# Patient Record
Sex: Female | Born: 1945
Health system: Southern US, Community
[De-identification: ages and names within clinical notes are randomized; demographics above are authoritative.]

## PROBLEM LIST (undated history)

## (undated) DIAGNOSIS — M1990 Unspecified osteoarthritis, unspecified site: Secondary | ICD-10-CM

## (undated) DIAGNOSIS — Z720 Tobacco use: Secondary | ICD-10-CM

## (undated) DIAGNOSIS — Z8719 Personal history of other diseases of the digestive system: Secondary | ICD-10-CM

## (undated) DIAGNOSIS — R519 Headache, unspecified: Secondary | ICD-10-CM

## (undated) DIAGNOSIS — Z9889 Other specified postprocedural states: Secondary | ICD-10-CM

## (undated) DIAGNOSIS — K759 Inflammatory liver disease, unspecified: Secondary | ICD-10-CM

## (undated) DIAGNOSIS — K219 Gastro-esophageal reflux disease without esophagitis: Secondary | ICD-10-CM

## (undated) DIAGNOSIS — I10 Essential (primary) hypertension: Secondary | ICD-10-CM

## (undated) DIAGNOSIS — R51 Headache: Secondary | ICD-10-CM

## (undated) DIAGNOSIS — I639 Cerebral infarction, unspecified: Secondary | ICD-10-CM

## (undated) HISTORY — PX: DERMOID CYST  EXCISION: SHX1452

## (undated) HISTORY — PX: APPENDECTOMY: SHX54

## (undated) HISTORY — DX: Tobacco use: Z72.0

## (undated) HISTORY — DX: Essential (primary) hypertension: I10

## (undated) HISTORY — DX: Cerebral infarction, unspecified: I63.9

---

## 1977-11-04 HISTORY — PX: OTHER SURGICAL HISTORY: SHX169

## 1998-02-20 ENCOUNTER — Ambulatory Visit (HOSPITAL_COMMUNITY): Admission: RE | Admit: 1998-02-20 | Discharge: 1998-02-20 | Payer: Self-pay | Admitting: *Deleted

## 2016-07-05 DIAGNOSIS — I639 Cerebral infarction, unspecified: Secondary | ICD-10-CM

## 2016-07-05 HISTORY — DX: Cerebral infarction, unspecified: I63.9

## 2016-07-20 ENCOUNTER — Emergency Department (HOSPITAL_COMMUNITY): Payer: Medicare HMO

## 2016-07-20 ENCOUNTER — Inpatient Hospital Stay (HOSPITAL_COMMUNITY)
Admission: EM | Admit: 2016-07-20 | Discharge: 2016-07-23 | DRG: 042 | Disposition: A | Discharge status: Home / Self Care | Payer: Medicare HMO | Attending: Family Medicine | Admitting: Family Medicine

## 2016-07-20 ENCOUNTER — Encounter (HOSPITAL_COMMUNITY): Payer: Self-pay

## 2016-07-20 DIAGNOSIS — I639 Cerebral infarction, unspecified: Secondary | ICD-10-CM | POA: Diagnosis not present

## 2016-07-20 DIAGNOSIS — G8314 Monoplegia of lower limb affecting left nondominant side: Secondary | ICD-10-CM | POA: Diagnosis present

## 2016-07-20 DIAGNOSIS — I10 Essential (primary) hypertension: Secondary | ICD-10-CM | POA: Diagnosis present

## 2016-07-20 DIAGNOSIS — I6381 Other cerebral infarction due to occlusion or stenosis of small artery: Secondary | ICD-10-CM

## 2016-07-20 DIAGNOSIS — R911 Solitary pulmonary nodule: Secondary | ICD-10-CM | POA: Diagnosis present

## 2016-07-20 DIAGNOSIS — E876 Hypokalemia: Secondary | ICD-10-CM

## 2016-07-20 DIAGNOSIS — Z720 Tobacco use: Secondary | ICD-10-CM

## 2016-07-20 DIAGNOSIS — R69 Illness, unspecified: Secondary | ICD-10-CM | POA: Diagnosis not present

## 2016-07-20 DIAGNOSIS — I6789 Other cerebrovascular disease: Secondary | ICD-10-CM | POA: Diagnosis not present

## 2016-07-20 DIAGNOSIS — R531 Weakness: Secondary | ICD-10-CM | POA: Diagnosis not present

## 2016-07-20 DIAGNOSIS — Z6832 Body mass index (BMI) 32.0-32.9, adult: Secondary | ICD-10-CM

## 2016-07-20 DIAGNOSIS — E669 Obesity, unspecified: Secondary | ICD-10-CM | POA: Diagnosis present

## 2016-07-20 DIAGNOSIS — R200 Anesthesia of skin: Secondary | ICD-10-CM | POA: Diagnosis not present

## 2016-07-20 DIAGNOSIS — F17210 Nicotine dependence, cigarettes, uncomplicated: Secondary | ICD-10-CM | POA: Diagnosis present

## 2016-07-20 LAB — COMPREHENSIVE METABOLIC PANEL
ALK PHOS: 52 U/L (ref 38–126)
ALT: 26 U/L (ref 14–54)
ANION GAP: 9 (ref 5–15)
AST: 26 U/L (ref 15–41)
Albumin: 4 g/dL (ref 3.5–5.0)
BILIRUBIN TOTAL: 0.6 mg/dL (ref 0.3–1.2)
BUN: 8 mg/dL (ref 6–20)
CALCIUM: 10.1 mg/dL (ref 8.9–10.3)
CO2: 26 mmol/L (ref 22–32)
CREATININE: 0.82 mg/dL (ref 0.44–1.00)
Chloride: 104 mmol/L (ref 101–111)
GFR calc non Af Amer: >60 mL/min (ref >60)
Glucose, Bld: 124 mg/dL — ABNORMAL HIGH (ref 65–99)
Potassium: 3.2 mmol/L — ABNORMAL LOW (ref 3.5–5.1)
Sodium: 139 mmol/L (ref 135–145)
TOTAL PROTEIN: 7.3 g/dL (ref 6.5–8.1)

## 2016-07-20 LAB — CBC
HEMATOCRIT: 43.4 % (ref 36.0–46.0)
HEMOGLOBIN: 14.3 g/dL (ref 12.0–15.0)
MCH: 30.5 pg (ref 26.0–34.0)
MCHC: 32.9 g/dL (ref 30.0–36.0)
MCV: 92.5 fL (ref 78.0–100.0)
Platelets: 264 10*3/uL (ref 150–400)
RBC: 4.69 MIL/uL (ref 3.87–5.11)
RDW: 14.7 % (ref 11.5–15.5)
WBC: 7 10*3/uL (ref 4.0–10.5)

## 2016-07-20 LAB — PROTIME-INR
INR: 1
PROTHROMBIN TIME: 13.2 s (ref 11.4–15.2)

## 2016-07-20 LAB — DIFFERENTIAL
Basophils Absolute: 0.1 10*3/uL (ref 0.0–0.1)
Basophils Relative: 1 %
EOS PCT: 1 %
Eosinophils Absolute: 0.1 10*3/uL (ref 0.0–0.7)
LYMPHS ABS: 2.6 10*3/uL (ref 0.7–4.0)
LYMPHS PCT: 38 %
MONO ABS: 0.3 10*3/uL (ref 0.1–1.0)
MONOS PCT: 5 %
Neutro Abs: 3.9 10*3/uL (ref 1.7–7.7)
Neutrophils Relative %: 55 %

## 2016-07-20 LAB — I-STAT TROPONIN, ED: Troponin i, poc: 0 ng/mL (ref 0.00–0.08)

## 2016-07-20 LAB — APTT: aPTT: 35 seconds (ref 24–36)

## 2016-07-20 MED ORDER — ACETAMINOPHEN 325 MG PO TABS
650.0000 mg | ORAL_TABLET | ORAL | Status: DC | PRN
  Administered 2016-07-21: 650 mg via ORAL
  Filled 2016-07-20: qty 2

## 2016-07-20 MED ORDER — SENNOSIDES-DOCUSATE SODIUM 8.6-50 MG PO TABS
1.0000 | ORAL_TABLET | Freq: Every evening | ORAL | Status: DC | PRN
  Administered 2016-07-22: 1 via ORAL
  Filled 2016-07-20: qty 1

## 2016-07-20 MED ORDER — ACETAMINOPHEN 650 MG RE SUPP: 650.0000 mg | RECTAL | Status: DC | PRN

## 2016-07-20 MED ORDER — ASPIRIN 325 MG PO TABS
325.0000 mg | ORAL_TABLET | Freq: Every day | ORAL | Status: DC
  Administered 2016-07-20 – 2016-07-23 (×4): 325 mg via ORAL
  Filled 2016-07-20 (×4): qty 1

## 2016-07-20 MED ORDER — ENOXAPARIN SODIUM 40 MG/0.4ML ~~LOC~~ SOLN
40.0000 mg | SUBCUTANEOUS | Status: DC
  Administered 2016-07-20 – 2016-07-22 (×3): 40 mg via SUBCUTANEOUS
  Filled 2016-07-20 (×3): qty 0.4

## 2016-07-20 MED ORDER — STROKE: EARLY STAGES OF RECOVERY BOOK
Freq: Once | Status: AC
  Administered 2016-07-21: 09:00:00

## 2016-07-20 MED ORDER — POTASSIUM CHLORIDE CRYS ER 20 MEQ PO TBCR
40.0000 meq | EXTENDED_RELEASE_TABLET | Freq: Once | ORAL | Status: AC
  Administered 2016-07-20: 40 meq via ORAL
  Filled 2016-07-20: qty 2

## 2016-07-20 MED ORDER — LORAZEPAM 2 MG/ML IJ SOLN
1.0000 mg | Freq: Once | INTRAMUSCULAR | Status: AC
  Administered 2016-07-20: 1 mg via INTRAVENOUS
  Filled 2016-07-20: qty 1

## 2016-07-20 MED ORDER — ASPIRIN 300 MG RE SUPP: 300.0000 mg | Freq: Every day | RECTAL | Status: DC

## 2016-07-20 MED ORDER — HYDRALAZINE HCL 20 MG/ML IJ SOLN: 10.0000 mg | Freq: Three times a day (TID) | INTRAMUSCULAR | Status: DC | PRN

## 2016-07-20 MED ORDER — SODIUM CHLORIDE 0.9 % IV SOLN
INTRAVENOUS | Status: DC
  Administered 2016-07-20: 21:00:00 via INTRAVENOUS

## 2016-07-20 NOTE — ED Notes (Signed)
In mri

## 2016-07-20 NOTE — ED Provider Notes (Signed)
[facility] DEPT Provider Note   CSN: [id number] Arrival date & time: 07/20/16  1030     History   Chief Complaint Chief Complaint  Patient presents with  . Numbness    HPI VANISSA STRENGTH is a 70 y.o. female.  HPI  70 year old female presents with left leg weakness since around 1 AM. Went to bed last night feeling fine at 9:00. She went to go the bathroom at 1 AM and had trouble moving her left leg. She is able to walk but it feels like she is dragging her leg and like her foot is been down to the ground. There is no numbness. She has no headache, neck pain, upper extremity weakness, or right-sided symptoms. No chest pain, back pain, or abdominal pain. She states she has no medical problem is but also knowledge she has not seen a doctor in "a long time". She states she went to her part-time job yesterday and walked a lot but this was not abnormal for her or more than she would typically walk. There is no pain in her lower extremities.  History reviewed. No pertinent past medical history.  There are no active problems to display for this patient.   History reviewed. No pertinent surgical history.  OB History    No data available       Home Medications    Prior to Admission medications   Not on File    Family History No family history on file.  Social History Social History  Substance Use Topics  . Smoking status: Current Every Day Smoker  . Smokeless tobacco: Never Used  . Alcohol use Not on file     Allergies   Review of patient's allergies indicates no known allergies.   Review of Systems Review of Systems  Respiratory: Negative for shortness of breath.   Cardiovascular: Negative for chest pain.  Gastrointestinal: Negative for abdominal pain.  Musculoskeletal: Negative for back pain and neck pain.  Neurological: Positive for weakness. Negative for numbness and headaches.  All other systems reviewed and are negative.    Physical Exam Updated  Vital Signs BP (!) 180/107   Pulse 91   Temp 98.4 F (36.9 C) (Oral)   Resp 12   SpO2 100%   Physical Exam  Constitutional: She is oriented to person, place, and time. She appears well-developed and well-nourished.  HENT:  Head: Normocephalic and atraumatic.  Right Ear: External ear normal.  Left Ear: External ear normal.  Nose: Nose normal.  Eyes: Right eye exhibits no discharge. Left eye exhibits no discharge.  Cardiovascular: Normal rate, regular rhythm and normal heart sounds.   Pulses:      Dorsalis pedis pulses are 2+ on the right side, and 2+ on the left side.  Pulmonary/Chest: Effort normal and breath sounds normal.  Abdominal: She exhibits no distension. There is no tenderness.  Neurological: She is alert and oriented to person, place, and time.  CN 3-12 grossly intact. 5/5 strength in RUE, LUE, RLE. 4/5 strength in LLE. Grossly normal sensation. Normal finger to nose.   Skin: Skin is warm and dry.  Nursing note and vitals reviewed.    ED Treatments / Results  Labs (all labs ordered are listed, but only abnormal results are displayed) Labs Reviewed  COMPREHENSIVE METABOLIC PANEL - Abnormal; Notable for the following:       Result Value   Potassium 3.2 (*)    Glucose, Bld 124 (*)    All other components within normal limits  PROTIME-INR  APTT  CBC  DIFFERENTIAL  I-STAT TROPOININ, ED    EKG  EKG Interpretation  Date/Time:  Saturday July 20 2016 10:33:47 EDT Ventricular Rate:  96 PR Interval:  152 QRS Duration: 86 QT Interval:  360 QTC Calculation: 454 R Axis:   39 Text Interpretation:  Normal sinus rhythm Nonspecific ST abnormality Abnormal ECG No old tracing to compare Confirmed by Isis Costanza MD, Mauria Asquith [phone]) on 07/20/2016 10:49:58 AM       Radiology No results found.  Procedures Procedures (including critical care time)  Medications Ordered in ED Medications - No data to display   Initial Impression / Assessment and Plan / ED Course  I  have reviewed the triage vital signs and the nursing notes.  Pertinent labs & imaging results that were available during my care of the patient were reviewed by me and considered in my medical decision making (see chart for details).  Clinical Course  Comment By Time  I am concerned about a stroke. Is out of the TPA/Code stroke window. Re-eval after CT. Sherwood Gambler, MD 09/16 1131  Dr. Leonel Ramsay recommends MRI brain to rule in CVA. If negative would need spinal MRI workup. Sherwood Gambler, MD 09/16 1317    MRI pending when care transferred to Dr. Lacinda Axon.  Final Clinical Impressions(s) / ED Diagnoses   Final diagnoses:  None    New Prescriptions New Prescriptions   No medications on file     Sherwood Gambler, MD 07/20/16 1631

## 2016-07-20 NOTE — ED Notes (Signed)
The pt returned from [place] for food she did not finish  New given  Alert oriented skin warm and d ry  She had 2 mg of ativan  Given in mri.  Moves al 4s skin warm and dry.  No pain

## 2016-07-20 NOTE — Consult Note (Signed)
Neurology Consultation Reason for Consult: Left leg weakness Referring Physician: Regenia Skeeter, S  CC: Left leg weakness  History is obtained from: Patient  HPI: Kathleen Guerrero is a 70 y.o. female went to bed sometime between 9 and 10 last night, woke in the middle the night with left leg weakness. She states that she tossed and turned, and finally came in the emergency department today after calling her friend. She states that it seems to be a stable deficits started. An MRI was performed in the emergency department which shows a subcortical stroke on the right.   LKW: 9 PM 9/15 tpa given?: no, out of window    ROS: A 14 point ROS was performed and is negative except as noted in the HPI.   Medical history: None  Family history: No history of similar   Social History:  reports that she has been smoking.  She has never used smokeless tobacco. Her alcohol and drug histories are not on file.   Exam: Current vital signs: BP 179/92   Pulse 95   Temp 98.4 F (36.9 C) (Oral)   Resp 18   SpO2 96%  Vital signs in last 24 hours: Temp:  [98 F (36.7 C)-98.4 F (36.9 C)] 98.4 F (36.9 C) (09/16 1703) Pulse Rate:  [82-104] 95 (09/16 1815) Resp:  [12-27] 18 (09/16 1815) BP: (122-197)/(74-149) 179/92 (09/16 1815) SpO2:  [94 %-100 %] 96 % (09/16 1815)   Physical Exam  Constitutional: Appears well-developed and well-nourished.  Psych: Affect appropriate to situation Eyes: No scleral injection HENT: No OP obstrucion Head: Normocephalic.  Cardiovascular: Normal rate and regular rhythm.  Respiratory: Effort normal and breath sounds normal to anterior ascultation GI: Soft.  No distension. There is no tenderness.  Skin: WDI  Neuro: Mental Status: Patient is awake, alert, oriented to person, place, month, year, and situation. Patient is able to give a clear and coherent history. No signs of aphasia or neglect Cranial Nerves: II: Visual Fields are full. Pupils are equal, round, and  reactive to light.   III,IV, VI: EOMI without ptosis or diploplia.  V: Facial sensation is symmetric to temperature VII: Facial movement is symmetric.  VIII: hearing is intact to voice X: Uvula elevates symmetrically XI: Shoulder shrug is symmetric. XII: tongue is midline without atrophy or fasciculations.  Motor: Tone is normal. Bulk is normal. 5/5 strength was present in bilateral arms and right leg, she has 4/5 distally, 3-/proximally in the left leg Sensory: Sensation is symmetric to light touch and temperature in the arms and legs. Cerebellar: FNF intact bilaterally      I have reviewed labs in epic and the results pertinent to this consultation are: Mild hypokalemia  I have reviewed the images obtained: MRI brain-white matter infarct on the  Impression: 70 year old female with acute ischemic infarct who needs to be admitted for evaluation and therapy.  Recommendations: 1. HgbA1c, fasting lipid panel 2. CT angiogram of the head and neck 3. Frequent neuro checks 4. Echocardiogram 5. Prophylactic therapy-Antiplatelet med: Aspirin - dose '325mg'$  PO or '300mg'$  PR 6. Risk factor modification 7. Telemetry monitoring 8. PT consult, OT consult, Speech consult 9. please page stroke NP  Or  PA  Or MD  from 8am -4 pm starting 9/17 as this patient will be followed by the stroke team at this point.   You can look them up on [email or web address]      Roland Rack, MD [facility] [phone]  If 7pm- 7am, please page neurology on call  as listed in [place].

## 2016-07-20 NOTE — ED Triage Notes (Signed)
Patient complains of inability to use her left leg since am, last normal after walking 6500 steps yesterday and states when she awoke unable to move her leg.  No other neuro deficits, denis pain to leg just doesn't seem to want to work.

## 2016-07-20 NOTE — H&P (Signed)
History and Physical    Kathleen Guerrero ZWC:[id number] DOB: January 15, 1946 DOA: 07/20/2016  PCP: No primary care provider on file.  Patient coming from: Home   Chief Complaint: Left LE weakness.   HPI: Kathleen Guerrero is a 70 y.o. female with no Known  medical history significant, does not follow with Doctors, who presents complaining of Left lower extremity weakness that started overnight. She think weakness started probably between 9 PM and 1 am (9-16). She wake up around 1 am to go to the bathroom, she didn't feel right, relates weakness left leg. She went back to sleep. This morning she wake up with persistent weakness of left lower extremity. She denies sl;urred speech, vision changes, or any other focal deficit.   ED Course: She was found to have elevated BP : 197/11, sodium normal, K at 3.2, CT head; no acute intra-cranial abnormalities, MRI Brain: Acute lacunar type infarct of the right corona radiata near the motor strip.  Review of Systems: As per HPI otherwise 10 point review of systems negative. Denies chest pain, dyspnea, abdominal pain.    past medical history.; no known past medical history   Past surgical history; none  Social history;   reports that she has been smoking.  She has never used smokeless tobacco. Her alcohol and drug histories are not on file. she is still working, part time at [employer].   No Known Allergies  Family history; parents deceased, no known medical problems. Daughter has melanoma.   Prior to Admission medications   Not on File    Physical Exam: Vitals:   07/20/16 1430 07/20/16 1445 07/20/16 1500 07/20/16 1703  BP: (!) 171/101 122/84 147/96 154/96  Pulse: 96 96 86 89  Resp: (!) '27 18 23 18  '$ Temp:    98.4 F (36.9 C)  TempSrc:    Oral  SpO2: 100% 97% 97% 95%      Constitutional: NAD, calm, comfortable Vitals:   07/20/16 1430 07/20/16 1445 07/20/16 1500 07/20/16 1703  BP: (!) 171/101 122/84 147/96 154/96  Pulse: 96 96 86 89    Resp: (!) '27 18 23 18  '$ Temp:    98.4 F (36.9 C)  TempSrc:    Oral  SpO2: 100% 97% 97% 95%   Eyes: PERRL, lids and conjunctivae normal ENMT: Mucous membranes are moist. Posterior pharynx clear of any exudate or lesions.Normal dentition.  Neck: normal, supple, no masses, no thyromegaly Respiratory: clear to auscultation bilaterally, no wheezing, no crackles. Normal respiratory effort. No accessory muscle use.  Cardiovascular: Regular rate and rhythm, no murmurs / rubs / gallops. No extremity edema. 2+ pedal pulses. No carotid bruits.  Abdomen: no tenderness, no masses palpated. No hepatosplenomegaly. Bowel sounds positive.  Musculoskeletal: no clubbing / cyanosis. No joint deformity upper and lower extremities. Good ROM, no contractures. Normal muscle tone.  Skin: no rashes, lesions, ulcers. No induration Neurologic: CN 2-12 grossly intact. Sensation intact, DTR normal. Strength 5/5 right side and left upper extremity. Left lower extremity; was able to raise leg 3-4/5 Psychiatric: Normal judgment and insight. Alert and oriented x 3. Normal mood.     Labs on Admission: I have personally reviewed following labs and imaging studies  CBC:  Recent Labs Lab 07/20/16 1042  WBC 7.0  NEUTROABS 3.9  HGB 14.3  HCT 43.4  MCV 92.5  PLT 235   Basic Metabolic Panel:  Recent Labs Lab 07/20/16 1042  NA 139  K 3.2*  CL 104  CO2 26  GLUCOSE 124*  BUN 8  CREATININE 0.82  CALCIUM 10.1   GFR: CrCl cannot be calculated (Unknown ideal weight.). Liver Function Tests:  Recent Labs Lab 07/20/16 1042  AST 26  ALT 26  ALKPHOS 52  BILITOT 0.6  PROT 7.3  ALBUMIN 4.0   No results for input(s): LIPASE, AMYLASE in the last 168 hours. No results for input(s): AMMONIA in the last 168 hours. Coagulation Profile:  Recent Labs Lab 07/20/16 1042  INR 1.00   Cardiac Enzymes: No results for input(s): CKTOTAL, CKMB, CKMBINDEX, TROPONINI in the last 168 hours. BNP (last 3 results) No  results for input(s): PROBNP in the last 8760 hours. HbA1C: No results for input(s): HGBA1C in the last 72 hours. CBG: No results for input(s): GLUCAP in the last 168 hours. Lipid Profile: No results for input(s): CHOL, HDL, LDLCALC, TRIG, CHOLHDL, LDLDIRECT in the last 72 hours. Thyroid Function Tests: No results for input(s): TSH, T4TOTAL, FREET4, T3FREE, THYROIDAB in the last 72 hours. Anemia Panel: No results for input(s): VITAMINB12, FOLATE, FERRITIN, TIBC, IRON, RETICCTPCT in the last 72 hours. Urine analysis: No results found for: COLORURINE, APPEARANCEUR, LABSPEC, PHURINE, GLUCOSEU, HGBUR, BILIRUBINUR, KETONESUR, PROTEINUR, UROBILINOGEN, NITRITE, LEUKOCYTESUR Sepsis Labs: !!!!!!!!!!!!!!!!!!!!!!!!!!!!!!!!!!!!!!!!!!!! '@LABRCNTIP'$ (procalcitonin:4,lacticidven:4) )No results found for this or any previous visit (from the past 240 hour(s)).   Radiological Exams on Admission: Ct Head Wo Contrast  Result Date: 07/20/2016 CLINICAL DATA:  70 year old female with acute left leg numbness and weakness today. EXAM: CT HEAD WITHOUT CONTRAST TECHNIQUE: Contiguous axial images were obtained from the base of the skull through the vertex without intravenous contrast. COMPARISON:  None. FINDINGS: Brain: No evidence of acute infarction, hemorrhage, hydrocephalus, extra-axial collection or mass lesion/mass effect. Mild atrophy and probable mild to moderate chronic small-vessel white matter ischemic changes noted. Vascular: Intracranial vascular calcifications noted. Skull: No acute abnormalities. A 2 cm posterior scalp lesion probably represents a partially calcified sebaceous cyst. Sinuses/Orbits: No acute finding. Other: None. IMPRESSION: No evidence of acute intracranial abnormality. Atrophy and probable mild to moderate chronic small-vessel white matter ischemic changes. Electronically Signed   By: Margarette Canada M.D.   On: 07/20/2016 12:39   Mr Brain Wo Contrast  Result Date: 07/20/2016 CLINICAL DATA:   70 year old female with acute onset left lower extremity weakness since 0100 hours. Went to bed normal at 2100 hours yesterday. Initial encounter. EXAM: MRI HEAD WITHOUT CONTRAST TECHNIQUE: Multiplanar, multiecho pulse sequences of the brain and surrounding structures were obtained without intravenous contrast. COMPARISON:  Head CT without contrast 1153 hours today. FINDINGS: Brain: There is a linear 2 cm area of restricted diffusion in the right centrum semiovale near the peri-Rolandic cortex (series 3, image 38 and series 8, image 14). Faint associated T2 and FLAIR hyperintensity. No associated hemorrhage or mass effect. No other restricted diffusion. No midline shift, mass effect, evidence of mass lesion, ventriculomegaly, extra-axial collection or acute intracranial hemorrhage. Cervicomedullary junction and pituitary are within normal limits. Widely scattered and patchy bilateral cerebral white matter T2 and FLAIR hyperintensity. No cortical encephalomalacia no chronic cerebral blood products identified. Comparatively mild T2 heterogeneity in the deep gray matter nuclei and pons. Otherwise the brainstem and cerebellum are normal. Vascular: Major intracranial vascular flow voids are preserved, with a degree of generalized intracranial artery dolichoectasia, especially in the posterior circulation. Skull and upper cervical spine: Multilevel chronic disc and endplate degeneration in the visible cervical spine. Mild anterolisthesis at C2-C3. Visualized bone marrow signal is within normal limits. Sinuses/Orbits: Posterior scalp soft tissue probable sebaceous cyst, a 2.6 cm. Other  scalp soft tissues appear within normal limits. Negative orbits soft tissues. Mild mucosal thickening left sphenoid sinus. Other paranasal sinuses are clear. Other: Visible internal auditory structures appear normal. Mastoids are clear. IMPRESSION: 1. Acute lacunar type infarct of the right corona radiata near the motor strip. No associated  hemorrhage or mass effect. 2. Moderately advanced for age nonspecific signal changes most pronounced in the white matter. Favor chronic small vessel disease related. Electronically Signed   By: Genevie Ann M.D.   On: 07/20/2016 17:18    EKG: Independently reviewed. Normal sinus rhythm.   Assessment/Plan Active Problems:   Acute lacunar stroke (HCC)   Hypokalemia   HTN (hypertension)  1-Acute Lacunar stoke,  Admit to telemetry, neuro floor. EKG sinus rhythm. Risk factors HTN , current smoker.  Permissive HTN.  Hydralazine for SBP more than 220.  Aspirin ordered.  PT, OT, Speech evaluation.  Neurology consulted, recommending CTA head and neck.  ECHO.  HbA1c and Lipid panel.   2-HTN; permissive HTN in setting of acute stroke. Hydralazine for SBP more than 220.  She will need BP medications prior to discharge.   3-Hypokalemia; replete orally.   4-Current smoker; counseling provide.     DVT prophylaxis: lovenox Code Status: full code.  Family Communication: care discussed with patient  Disposition Plan: in 48 hours if clinically stable and after work up for stroke completed.  Consults called: neurology  Admission status: observation, neuro floor.    Elmarie Shiley MD [facility] Pager [phone]  If 7PM-7AM, please contact night-coverage [email or web address] Password [employer]  07/20/2016, 5:41 PM

## 2016-07-21 ENCOUNTER — Observation Stay (HOSPITAL_COMMUNITY): Payer: Medicare HMO

## 2016-07-21 DIAGNOSIS — R69 Illness, unspecified: Secondary | ICD-10-CM | POA: Diagnosis not present

## 2016-07-21 DIAGNOSIS — Z720 Tobacco use: Secondary | ICD-10-CM | POA: Diagnosis not present

## 2016-07-21 DIAGNOSIS — I10 Essential (primary) hypertension: Secondary | ICD-10-CM

## 2016-07-21 DIAGNOSIS — F17210 Nicotine dependence, cigarettes, uncomplicated: Secondary | ICD-10-CM | POA: Diagnosis present

## 2016-07-21 DIAGNOSIS — E876 Hypokalemia: Secondary | ICD-10-CM | POA: Diagnosis not present

## 2016-07-21 DIAGNOSIS — Z6832 Body mass index (BMI) 32.0-32.9, adult: Secondary | ICD-10-CM | POA: Diagnosis not present

## 2016-07-21 DIAGNOSIS — I639 Cerebral infarction, unspecified: Secondary | ICD-10-CM | POA: Diagnosis not present

## 2016-07-21 DIAGNOSIS — G8314 Monoplegia of lower limb affecting left nondominant side: Secondary | ICD-10-CM | POA: Diagnosis not present

## 2016-07-21 DIAGNOSIS — E669 Obesity, unspecified: Secondary | ICD-10-CM | POA: Diagnosis not present

## 2016-07-21 DIAGNOSIS — R911 Solitary pulmonary nodule: Secondary | ICD-10-CM | POA: Diagnosis not present

## 2016-07-21 DIAGNOSIS — I6789 Other cerebrovascular disease: Secondary | ICD-10-CM | POA: Diagnosis not present

## 2016-07-21 DIAGNOSIS — R531 Weakness: Secondary | ICD-10-CM | POA: Diagnosis present

## 2016-07-21 DIAGNOSIS — I6523 Occlusion and stenosis of bilateral carotid arteries: Secondary | ICD-10-CM | POA: Diagnosis not present

## 2016-07-21 LAB — HEMOGLOBIN A1C
Hgb A1c MFr Bld: 5.7 % — ABNORMAL HIGH (ref 4.8–5.6)
Mean Plasma Glucose: 117 mg/dL

## 2016-07-21 LAB — LIPID PANEL
Cholesterol: 159 mg/dL (ref 0–200)
HDL: 38 mg/dL — AB (ref >40)
LDL CALC: 95 mg/dL (ref 0–99)
TRIGLYCERIDES: 128 mg/dL (ref <150)
Total CHOL/HDL Ratio: 4.2 RATIO
VLDL: 26 mg/dL (ref 0–40)

## 2016-07-21 MED ORDER — ATORVASTATIN CALCIUM 40 MG PO TABS
40.0000 mg | ORAL_TABLET | Freq: Every day | ORAL | Status: DC
  Administered 2016-07-21 – 2016-07-23 (×3): 40 mg via ORAL
  Filled 2016-07-21 (×3): qty 1

## 2016-07-21 MED ORDER — IOPAMIDOL (ISOVUE-370) INJECTION 76%
INTRAVENOUS | Status: AC
  Administered 2016-07-21: 50 mL
  Filled 2016-07-21: qty 50

## 2016-07-21 NOTE — Evaluation (Signed)
Occupational Therapy Evaluation Patient Details Name: Kathleen Guerrero MRN: [id number] DOB: Apr 17, 1946 Today's Date: 07/21/2016    History of Present Illness 70 y.o. female admitted for LLE weakness. MRI (+) for R corona radiata infarct. No pertinent PMH.   Clinical Impression   PTA, pt was independent with ADLs and mobility. Pt currently presents with LLE weakness and resulting balance deficits. Pt did not demonstrate any cognitive, visual, or UB strength/sensation deficits. Pt required close min guard assist and use of RW for mobility due to balance deficits. Pt dragging L foot on ground while ambulating making her a high fall risk. Pt plans to d/c home with intermittent assistance from her daughter. Pt will benefit from continued acute OT to increase independence and safety with ADLs and mobility to allow for safe discharge home. Recommend shower seat for home use.     Follow Up Recommendations  No OT follow up;Supervision - Intermittent    Equipment Recommendations  Tub/shower seat    Recommendations for Other Services PT consult     Precautions / Restrictions Precautions Precautions: Fall Restrictions Weight Bearing Restrictions: No      Mobility Bed Mobility               General bed mobility comments: Pt up with NT on OT arrival  Transfers Overall transfer level: Needs assistance Equipment used: Rolling walker (2 wheeled) Transfers: Sit to/from Stand Sit to Stand: Min guard         General transfer comment: Min guard assist for balance and safety. Pt with LLE weakness and spontaneous L knee buckling. VCs for hand placement.    Balance Overall balance assessment: Needs assistance Sitting-balance support: No upper extremity supported;Feet supported Sitting balance-Leahy Scale: Good     Standing balance support: Bilateral upper extremity supported;During functional activity Standing balance-Leahy Scale: Poor Standing balance comment: Reliant on BUE support  to maintain balance.                            ADL Overall ADL's : Needs assistance/impaired Eating/Feeding: Modified independent;Sitting   Grooming: Wash/dry hands;Wash/dry face;Min guard;Standing   Upper Body Bathing: Supervision/ safety;Sitting   Lower Body Bathing: Min guard;Sit to/from stand   Upper Body Dressing : Supervision/safety;Sitting   Lower Body Dressing: Min guard;Sit to/from stand   Toilet Transfer: Min guard;Cueing for safety;Ambulation;BSC;RW   Toileting- Water quality scientist and Hygiene: Min guard;Sit to/from stand       Functional mobility during ADLs: Min guard;Rolling walker       Vision Vision Assessment?: Yes Eye Alignment: Within Functional Limits Ocular Range of Motion: Within Functional Limits Alignment/Gaze Preference: Within Defined Limits Tracking/Visual Pursuits: Able to track stimulus in all quads without difficulty Saccades: Within functional limits Convergence: Within functional limits Visual Fields: No apparent deficits   Agricultural engineer Tested?: Yes Perception Deficits: Inattention/neglect Inattention/Neglect: Appears intact   Praxis Praxis Praxis tested?: Within functional limits    Pertinent Vitals/Pain Pain Assessment: No/denies pain     Hand Dominance Right   Extremity/Trunk Assessment Upper Extremity Assessment Upper Extremity Assessment: Overall WFL for tasks assessed   Lower Extremity Assessment Lower Extremity Assessment: LLE deficits/detail LLE Deficits / Details: hip flexion 3+/5, knee flexion/extension 3+/5, ankle dorsiflexion 3+/5; pt dragging toes on ground while ambulating LLE Coordination: decreased fine motor;decreased gross motor   Cervical / Trunk Assessment Cervical / Trunk Assessment: Normal   Communication Communication Communication: No difficulties   Cognition Arousal/Alertness: Awake/alert Behavior During Therapy: WFL for  tasks assessed/performed Overall  Cognitive Status: Within Functional Limits for tasks assessed                     General Comments       Exercises       Shoulder Instructions      Home Living Family/patient expects to be discharged to:: Private residence Living Arrangements: Alone Available Help at Discharge: Family;Available PRN/intermittently Type of Home: Apartment Home Access: Stairs to enter Entrance Stairs-Number of Steps: 5 Entrance Stairs-Rails: Left Home Layout: One level     Bathroom Shower/Tub: Teacher, early years/pre: Standard     Home Equipment: None          Prior Functioning/Environment Level of Independence: Independent        Comments: 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; drives        OT Problem List: Decreased strength;Impaired balance (sitting and/or standing);Decreased safety awareness;Decreased knowledge of use of DME or AE   OT Treatment/Interventions: Self-care/ADL training;Therapeutic exercise;Neuromuscular education;DME and/or AE instruction;Therapeutic activities;Patient/family education;Balance training    OT Goals(Current goals can be found in the care plan section) Acute Rehab OT Goals Patient Stated Goal: to get home and be completely independent OT Goal Formulation: With patient Time For Goal Achievement: 08/04/16 Potential to Achieve Goals: Good ADL Goals Pt Will Perform Lower Body Dressing: with modified independence;sit to/from stand Pt Will Transfer to Toilet: with modified independence;ambulating;regular height toilet Pt Will Perform Toileting - Clothing Manipulation and hygiene: with modified independence;sit to/from stand Pt Will Perform Tub/Shower Transfer: Tub transfer;with modified independence;ambulating;shower seat;rolling walker Additional ADL Goal #1: Pt will verbalize 3 home safety strategies to minimize her fall risk at home.  OT Frequency: Min 2X/week   Barriers to D/C:             Co-evaluation              End of Session Equipment Utilized During Treatment: Gait belt;Rolling walker Nurse Communication: Mobility status  Activity Tolerance: Patient tolerated treatment well Patient left: in chair;with call bell/phone within reach;with chair alarm set;with family/visitor present   Time: 1834-3735 OT Time Calculation (min): 31 min Charges:  OT General Charges $OT Visit: 1 Procedure OT Evaluation $OT Eval Moderate Complexity: 1 Procedure OT Treatments $Self Care/Home Management : 8-22 mins G-Codes: OT G-codes **NOT FOR INPATIENT CLASS** Functional Assessment Tool Used: clinical judgement Functional Limitation: Self care Self Care Current Status (D8978): At least 1 percent but less than 20 percent impaired, limited or restricted Self Care Goal Status (E7841): 0 percent impaired, limited or restricted  Redmond Baseman, OTR/L Pager: [phone] 07/21/2016, 10:43 AM

## 2016-07-21 NOTE — Progress Notes (Addendum)
PROGRESS NOTE    Kathleen Guerrero  EHM:[id number] DOB: July 07, 1946 DOA: 07/20/2016 PCP: No primary care provider on file. Will need to set up with PCP at time of discharge.    Brief Narrative:  Kathleen Guerrero is a 70 y.o. female with no known medical history significant, does not follow with doctors, who presents complaining of left lower extremity weakness that started overnight. She think weakness started probably between 9 PM and 1 AM (9/16). She woke up around 1 am to go to the bathroom, she didn't feel right, relates weakness left leg. She went back to sleep. This morning she woke up with persistent weakness of left lower extremity. MRI of the brain showed acute lacunar type infarct of the right corona radiata near motor strip. Neurology was consulted.  Assessment & Plan:   Principal Problem:   Acute lacunar stroke (HCC) Active Problems:   Hypokalemia   HTN (hypertension)   Tobacco abuse   Nodule of right lung  Acute lacunar stroke -Stroke team following  -Aspirin -PT/OT/SLP -CTA head and neck: unremarkable  -Echo pending  -Hemoglobin A1c pending  -LDL 95, start lipitor  -Neuro checks  -Tele   Essential hypertension -Permissive hypertension in the setting of acute stroke. Hydralazine when necessary for SBP greater than 220 -Will need blood pressure control prior to discharge  Hypokalemia -Replaced -Repeat BMP  Tobacco abuse -Encourage cessation  Incidental 15 mm right upper lobe pulmonary nodule -Needs outpatient follow up    DVT prophylaxis: lovenox Code Status: full Family Communication: daughter in room Disposition Plan: Pending further workup and PT, OT evaluation to discharge.   Consultants:   Neurology   Procedures:  none  Antimicrobials:  None     Subjective: Patient states that her left lower leg weakness has improved but still not to baseline. She denies any vision changes, speech deficits, upper extremity weakness. She has been feeling well  recently. No fevers, cough, chest pain, nausea, vomiting. She had a little bit of diarrhea on Thursday, which has resolved.  Objective: Vitals:   07/21/16 0130 07/21/16 0330 07/21/16 0530 07/21/16 0730  BP: (!) 153/81 (!) 152/91 (!) 170/89 (!) 166/82  Pulse: 77 77 78 79  Resp: '16 18 16 18  '$ Temp: 98.8 F (37.1 C) 98.2 F (36.8 C) 97.8 F (36.6 C) 98.1 F (36.7 C)  TempSrc: Oral Oral Oral Oral  SpO2: 96% 97% 95% 96%    Intake/Output Summary (Last 24 hours) at 07/21/16 1010 Last data filed at 07/20/16 2122  Gross per 24 hour  Intake              120 ml  Output                0 ml  Net              120 ml   There were no vitals filed for this visit.  Examination:  General exam: Appears calm and comfortable  Respiratory system: Clear to auscultation. Respiratory effort normal. Cardiovascular system: S1 & S2 heard, RRR. No JVD, murmurs, rubs, gallops or clicks. No pedal edema. Gastrointestinal system: Abdomen is nondistended, soft and nontender. No organomegaly or masses felt. Normal bowel sounds heard. Central nervous system: Alert and oriented. No focal neurological deficits. Extremities: LLE weakness 4/5, otherwise all limbs 5/5  Skin: No rashes, lesions or ulcers Psychiatry: Judgement and insight appear normal. Mood & affect appropriate.   Data Reviewed: I have personally reviewed following labs and imaging studies  CBC:  Recent Labs Lab 07/20/16 1042  WBC 7.0  NEUTROABS 3.9  HGB 14.3  HCT 43.4  MCV 92.5  PLT 277   Basic Metabolic Panel:  Recent Labs Lab 07/20/16 1042  NA 139  K 3.2*  CL 104  CO2 26  GLUCOSE 124*  BUN 8  CREATININE 0.82  CALCIUM 10.1   GFR: CrCl cannot be calculated (Unknown ideal weight.). Liver Function Tests:  Recent Labs Lab 07/20/16 1042  AST 26  ALT 26  ALKPHOS 52  BILITOT 0.6  PROT 7.3  ALBUMIN 4.0   No results for input(s): LIPASE, AMYLASE in the last 168 hours. No results for input(s): AMMONIA in the last 168  hours. Coagulation Profile:  Recent Labs Lab 07/20/16 1042  INR 1.00   Cardiac Enzymes: No results for input(s): CKTOTAL, CKMB, CKMBINDEX, TROPONINI in the last 168 hours. BNP (last 3 results) No results for input(s): PROBNP in the last 8760 hours. HbA1C: No results for input(s): HGBA1C in the last 72 hours. CBG: No results for input(s): GLUCAP in the last 168 hours. Lipid Profile:  Recent Labs  07/21/16 0504  CHOL 159  HDL 38*  LDLCALC 95  TRIG 128  CHOLHDL 4.2   Thyroid Function Tests: No results for input(s): TSH, T4TOTAL, FREET4, T3FREE, THYROIDAB in the last 72 hours. Anemia Panel: No results for input(s): VITAMINB12, FOLATE, FERRITIN, TIBC, IRON, RETICCTPCT in the last 72 hours. Sepsis Labs: No results for input(s): PROCALCITON, LATICACIDVEN in the last 168 hours.  No results found for this or any previous visit (from the past 240 hour(s)).       Radiology Studies: Ct Angio Head W Or Wo Contrast  Result Date: 07/21/2016 CLINICAL DATA:  70 y/o F; left leg weakness and right corona radiata stroke. EXAM: CT ANGIOGRAPHY HEAD AND NECK TECHNIQUE: Multidetector CT imaging of the head and neck was performed using the standard protocol during bolus administration of intravenous contrast. Multiplanar CT image reconstructions and MIPs were obtained to evaluate the vascular anatomy. Carotid stenosis measurements (when applicable) are obtained utilizing NASCET criteria, using the distal internal carotid diameter as the denominator. CONTRAST:  50 cc Isovue 370. COMPARISON:  07/20/2016 MRI brain. FINDINGS: CTA NECK Aortic arch: Mild calcified atherosclerosis. Mixed plaque of left subclavian origin and proximal vessel with minimal stenosis. Right carotid system: Brief submucosal retropharyngeal course of the right common carotid artery. Mild calcification of the proximal internal carotid artery. Moderate tortuosity of the upper internal carotid artery. No occlusion, aneurysm,  dissection, or significant stenosis is identified. Left carotid system: Brief retropharyngeal submucosal course of the common carotid artery. Moderate calcific atherosclerosis of the left carotid bifurcation. No significant stenosis. No occlusion, aneurysm, dissection, or significant stenosis is identified. Vertebral arteries:Patent bilaterally. No occlusion, aneurysm, dissection, or significant stenosis is identified. Skeleton: No acute fracture or dislocation is identified. Reversal of cervical lordosis. Grade 1 C2-3 anterolisthesis. Multilevel discogenic degenerative changes greatest from C4 through C6 with there are large marginal osteophytes and uncovertebral hypertrophy. No high-grade bony canal stenosis. Other neck: Normal thyroid gland. No lymphadenopathy or discrete cervical mass is identified. The aerodigestive tract is patent. Postinflammatory calcifications within the palatine tonsils. Upper chest: 15 mm nodule within the right upper lobe (Series 5, image 164). CTA HEAD Anterior circulation: Moderate calcific atherosclerosis of the carotid siphons bilaterally. Mild right-sided supraclinoid stenosis. Bilateral middle cerebral arteries and anterior cerebral arteries are patent. No occlusion, aneurysm, or high-grade stenosis is identified. Posterior circulation: Bilateral vertebral arteries, the basilar artery, bilateral PICA, left AICA, bilateral  SCA, and bilateral posterior cerebral arteries are patent. No occlusion, aneurysm, or significant stenosis is identified. Venous sinuses: No dural venous sinus thrombosis is identified. Anatomic variants: No anterior or posterior communicating arteries are identified, likely hypoplastic or absent. Delayed phase: No abnormal enhancement of the brain parenchyma. Lesion within the scalp measuring 11 x 23 mm in the midline parietal region is stable from prior MRI, likely sebaceous cyst. IMPRESSION: 1. Carotid and vertebral arteries of the neck are patent. No  occlusion, aneurysm, dissection, or significant stenosis is identified. Mild calcified atherosclerosis of the bifurcations bilaterally. 2. Patent anterior and posterior circulations of the circle of Willis. No large vessel occlusion, aneurysm, or significant stenosis is identified. Mild intracranial atherosclerosis. 3. **An incidental finding of potential clinical significance has been found. 15 mm right upper lobe pulmonary nodule. Consider one of the following in 3 months for both low-risk and high-risk individuals: (a) repeat chest CT, (b) follow-up PET-CT, or (c) tissue sampling. This recommendation follows the consensus statement: Guidelines for Management of Incidental Pulmonary Nodules Detected on CT Images: From the Fleischner Society 2017; Radiology 2017; 284:228-243.** 4. Moderate degenerative changes of the cervical spine. Electronically Signed   By: Kristine Garbe M.D.   On: 07/21/2016 07:05   Ct Head Wo Contrast  Result Date: 07/20/2016 CLINICAL DATA:  70 year old female with acute left leg numbness and weakness today. EXAM: CT HEAD WITHOUT CONTRAST TECHNIQUE: Contiguous axial images were obtained from the base of the skull through the vertex without intravenous contrast. COMPARISON:  None. FINDINGS: Brain: No evidence of acute infarction, hemorrhage, hydrocephalus, extra-axial collection or mass lesion/mass effect. Mild atrophy and probable mild to moderate chronic small-vessel white matter ischemic changes noted. Vascular: Intracranial vascular calcifications noted. Skull: No acute abnormalities. A 2 cm posterior scalp lesion probably represents a partially calcified sebaceous cyst. Sinuses/Orbits: No acute finding. Other: None. IMPRESSION: No evidence of acute intracranial abnormality. Atrophy and probable mild to moderate chronic small-vessel white matter ischemic changes. Electronically Signed   By: Margarette Canada M.D.   On: 07/20/2016 12:39   Ct Angio Neck W Or Wo Contrast  Result  Date: 07/21/2016 CLINICAL DATA:  70 y/o F; left leg weakness and right corona radiata stroke. EXAM: CT ANGIOGRAPHY HEAD AND NECK TECHNIQUE: Multidetector CT imaging of the head and neck was performed using the standard protocol during bolus administration of intravenous contrast. Multiplanar CT image reconstructions and MIPs were obtained to evaluate the vascular anatomy. Carotid stenosis measurements (when applicable) are obtained utilizing NASCET criteria, using the distal internal carotid diameter as the denominator. CONTRAST:  50 cc Isovue 370. COMPARISON:  07/20/2016 MRI brain. FINDINGS: CTA NECK Aortic arch: Mild calcified atherosclerosis. Mixed plaque of left subclavian origin and proximal vessel with minimal stenosis. Right carotid system: Brief submucosal retropharyngeal course of the right common carotid artery. Mild calcification of the proximal internal carotid artery. Moderate tortuosity of the upper internal carotid artery. No occlusion, aneurysm, dissection, or significant stenosis is identified. Left carotid system: Brief retropharyngeal submucosal course of the common carotid artery. Moderate calcific atherosclerosis of the left carotid bifurcation. No significant stenosis. No occlusion, aneurysm, dissection, or significant stenosis is identified. Vertebral arteries:Patent bilaterally. No occlusion, aneurysm, dissection, or significant stenosis is identified. Skeleton: No acute fracture or dislocation is identified. Reversal of cervical lordosis. Grade 1 C2-3 anterolisthesis. Multilevel discogenic degenerative changes greatest from C4 through C6 with there are large marginal osteophytes and uncovertebral hypertrophy. No high-grade bony canal stenosis. Other neck: Normal thyroid gland. No lymphadenopathy or discrete cervical  mass is identified. The aerodigestive tract is patent. Postinflammatory calcifications within the palatine tonsils. Upper chest: 15 mm nodule within the right upper lobe (Series  5, image 164). CTA HEAD Anterior circulation: Moderate calcific atherosclerosis of the carotid siphons bilaterally. Mild right-sided supraclinoid stenosis. Bilateral middle cerebral arteries and anterior cerebral arteries are patent. No occlusion, aneurysm, or high-grade stenosis is identified. Posterior circulation: Bilateral vertebral arteries, the basilar artery, bilateral PICA, left AICA, bilateral SCA, and bilateral posterior cerebral arteries are patent. No occlusion, aneurysm, or significant stenosis is identified. Venous sinuses: No dural venous sinus thrombosis is identified. Anatomic variants: No anterior or posterior communicating arteries are identified, likely hypoplastic or absent. Delayed phase: No abnormal enhancement of the brain parenchyma. Lesion within the scalp measuring 11 x 23 mm in the midline parietal region is stable from prior MRI, likely sebaceous cyst. IMPRESSION: 1. Carotid and vertebral arteries of the neck are patent. No occlusion, aneurysm, dissection, or significant stenosis is identified. Mild calcified atherosclerosis of the bifurcations bilaterally. 2. Patent anterior and posterior circulations of the circle of Willis. No large vessel occlusion, aneurysm, or significant stenosis is identified. Mild intracranial atherosclerosis. 3. **An incidental finding of potential clinical significance has been found. 15 mm right upper lobe pulmonary nodule. Consider one of the following in 3 months for both low-risk and high-risk individuals: (a) repeat chest CT, (b) follow-up PET-CT, or (c) tissue sampling. This recommendation follows the consensus statement: Guidelines for Management of Incidental Pulmonary Nodules Detected on CT Images: From the Fleischner Society 2017; Radiology 2017; 284:228-243.** 4. Moderate degenerative changes of the cervical spine. Electronically Signed   By: Kristine Garbe M.D.   On: 07/21/2016 07:05   Mr Brain Wo Contrast  Result Date:  07/20/2016 CLINICAL DATA:  70 year old female with acute onset left lower extremity weakness since 0100 hours. Went to bed normal at 2100 hours yesterday. Initial encounter. EXAM: MRI HEAD WITHOUT CONTRAST TECHNIQUE: Multiplanar, multiecho pulse sequences of the brain and surrounding structures were obtained without intravenous contrast. COMPARISON:  Head CT without contrast 1153 hours today. FINDINGS: Brain: There is a linear 2 cm area of restricted diffusion in the right centrum semiovale near the peri-Rolandic cortex (series 3, image 38 and series 8, image 14). Faint associated T2 and FLAIR hyperintensity. No associated hemorrhage or mass effect. No other restricted diffusion. No midline shift, mass effect, evidence of mass lesion, ventriculomegaly, extra-axial collection or acute intracranial hemorrhage. Cervicomedullary junction and pituitary are within normal limits. Widely scattered and patchy bilateral cerebral white matter T2 and FLAIR hyperintensity. No cortical encephalomalacia no chronic cerebral blood products identified. Comparatively mild T2 heterogeneity in the deep gray matter nuclei and pons. Otherwise the brainstem and cerebellum are normal. Vascular: Major intracranial vascular flow voids are preserved, with a degree of generalized intracranial artery dolichoectasia, especially in the posterior circulation. Skull and upper cervical spine: Multilevel chronic disc and endplate degeneration in the visible cervical spine. Mild anterolisthesis at C2-C3. Visualized bone marrow signal is within normal limits. Sinuses/Orbits: Posterior scalp soft tissue probable sebaceous cyst, a 2.6 cm. Other scalp soft tissues appear within normal limits. Negative orbits soft tissues. Mild mucosal thickening left sphenoid sinus. Other paranasal sinuses are clear. Other: Visible internal auditory structures appear normal. Mastoids are clear. IMPRESSION: 1. Acute lacunar type infarct of the right corona radiata near the  motor strip. No associated hemorrhage or mass effect. 2. Moderately advanced for age nonspecific signal changes most pronounced in the white matter. Favor chronic small vessel disease related. Electronically Signed  By: Genevie Ann M.D.   On: 07/20/2016 17:18        Scheduled Meds: . aspirin  300 mg Rectal Daily   Or  . aspirin  325 mg Oral Daily  . atorvastatin  40 mg Oral q1800  . enoxaparin (LOVENOX) injection  40 mg Subcutaneous Q24H   Continuous Infusions:     LOS: 0 days    Time spent: 40 minutes    Dessa Phi, DO [facility] Pager [phone]  If 7PM-7AM, please contact night-coverage [email or web address] Password [email or web address] 07/21/2016, 10:10 AM

## 2016-07-21 NOTE — Progress Notes (Signed)
STROKE TEAM PROGRESS NOTE   HISTORY OF PRESENT ILLNESS (per record) Kathleen Guerrero is a 70 y.o. female went to bed sometime between 9 and 10 last night, woke in the middle the night with left leg weakness. She states that she tossed and turned, and finally came in the emergency department today after calling her friend. She states that it seems to be a stable deficits started. An MRI was performed in the emergency department which shows a subcortical stroke on the right.   LKW: 9 PM 9/15 tpa given?: no, out of window   SUBJECTIVE (INTERVAL HISTORY) The patient's daughter was at the bedside. Dr. Leonie Man discussed the patient's stroke and the ongoing workup. The patient will need a TEE and possible loop. We will try to schedule this for tomorrow; although, multiple studies have already been added. The patient was also informed of the lung nodule noted on CT angiogram. She has a heavy tobacco history and is very concerned about the possibility of malignancy. Chest CT with and without contrast has been ordered.   OBJECTIVE Temp:  [97.8 F (36.6 C)-98.8 F (37.1 C)] 98.1 F (36.7 C) (09/17 0730) Pulse Rate:  [77-104] 79 (09/17 0730) Cardiac Rhythm: Normal sinus rhythm (09/17 0730) Resp:  [12-27] 18 (09/17 0730) BP: (122-197)/(74-149) 166/82 (09/17 0730) SpO2:  [94 %-100 %] 96 % (09/17 0730)  CBC:  Recent Labs Lab 07/20/16 1042  WBC 7.0  NEUTROABS 3.9  HGB 14.3  HCT 43.4  MCV 92.5  PLT 517    Basic Metabolic Panel:  Recent Labs Lab 07/20/16 1042  NA 139  K 3.2*  CL 104  CO2 26  GLUCOSE 124*  BUN 8  CREATININE 0.82  CALCIUM 10.1    Lipid Panel:    Component Value Date/Time   CHOL 159 07/21/2016 0504   TRIG 128 07/21/2016 0504   HDL 38 (L) 07/21/2016 0504   CHOLHDL 4.2 07/21/2016 0504   VLDL 26 07/21/2016 0504   LDLCALC 95 07/21/2016 0504   HgbA1c: No results found for: HGBA1C Urine Drug Screen: No results found for: LABOPIA, COCAINSCRNUR, LABBENZ, AMPHETMU, THCU,  LABBARB    IMAGING  Ct Angio Head and Neck W Or Wo Contrast 07/21/2016 1. Carotid and vertebral arteries of the neck are patent. No occlusion, aneurysm, dissection, or significant stenosis is identified. Mild calcified atherosclerosis of the bifurcations bilaterally.  2. Patent anterior and posterior circulations of the circle of Willis. No large vessel occlusion, aneurysm, or significant stenosis is identified. Mild intracranial atherosclerosis.  3. **An incidental finding of potential clinical significance has been found. 15 mm right upper lobe pulmonary nodule.  Consider one of the following in 3 months for both low-risk and high-risk individuals:  (a) repeat chest CT,  (b) follow-up PET-CT, or  (c) tissue sampling.     Ct Head Wo Contrast 07/20/2016 No evidence of acute intracranial abnormality. Atrophy and probable mild to moderate chronic small-vessel white matter ischemic changes.     Mr Brain Wo Contrast 07/20/2016 1. Acute lacunar type infarct of the right corona radiata near the motor strip. No associated hemorrhage or mass effect.  2. Moderately advanced for age nonspecific signal changes most pronounced in the white matter. Favor chronic small vessel disease related.     CT Chest with and without contrast - pending    PHYSICAL EXAM  Anxious middle aged caucasian lady not in distress. . Afebrile. Head is nontraumatic. Neck is supple without bruit.    Cardiac exam no murmur or  gallop. Lungs are clear to auscultation. Distal pulses are well felt.  Neurological Exam :  Awake alert oriented x 3 normal speech and language. Visual acuity seems normal. Pupils are equal and reactive. Fundi were not visualized. Visual fields are full to bedside confrontational testing. Mild left lower face asymmetry. Tongue midline. No drift. Mild diminished fine finger movements on left. Orbits right over left upper extremity. Mild left hip flexor and ankle dorsiflexor weakness.. Normal  sensation . Normal coordination.      ASSESSMENT/PLAN Ms. Kathleen Guerrero is a 70 y.o. female with history of tobacco use and hypertension presenting with left leg weakness.. She did not receive IV t-PA due to late presentation.  Stroke:  Non-dominant infarct possibly embolic from an unknown source vs small vessel disease.  Resultant very mild left hemiparesis  MRI - Acute lacunar type infarct of the right corona radiata near the motor strip.  MRA - not performed  CTA H&N - no significant stenosis; however, incidental finding 15 mm right upper lobe pulmonary nodule.  Carotid Doppler - CTA performed  2D Echo - pending  LDL - 95  HgbA1c - 5.7  VTE prophylaxis - Lovenox  Diet Heart Room service appropriate? Yes; Fluid consistency: Thin  No antithrombotic prior to admission, now on aspirin 325 mg daily  Patient counseled to be compliant with her antithrombotic medications  Ongoing aggressive stroke risk factor management  Therapy recommendations: Outpatient physical therapy recommended. No OT follow-up needed.   Disposition: Pending  Hypertension  Blood pressure tends to run high  Permissive hypertension (OK if < 220/120) but gradually normalize in 5-7 days  Long-term BP goal normotensive  Hyperlipidemia  Home meds: No lipid lowering medications prior to admission  LDL 95, goal < 70  Now on Lipitor 40 mg daily  Continue statin at discharge   Other Stroke Risk Factors  Advanced age  Cigarette smoker - advised to stop smoking  Obesity, There is no height or weight on file to calculate BMI., recommend weight loss, diet and exercise as appropriate     Other Active Problems  15 mm right upper lobe pulmonary nodule. (See recommendations above)   (HEAVY SMOKER)  Patient may be hypercoagulable secondary to malignancy.   PLAN  Chest CT with and without contrast to further evaluate pulmonary nodule.  TEE and possible loop (we'll try to schedule for  tomorrow)  Hospital day # 0  Mikey Bussing PA-C [facility] Pager [phone] 07/21/2016, 2:31 PM I have personally examined this patient, reviewed notes, independently viewed imaging studies, participated in medical decision making and plan of care.ROS completed by me personally and pertinent positives fully documented  I have made any additions or clarifications directly to the above note. Agree with note above.  She has presented with left leg weakness secondary to right frontal subcortical infarct. The infarct is a bit larger than a typical lacunar infarct and could be embolus with involvement of origins of several perforating vessels. The finding of pulmonary nodule raises possibility of lung cancer and hypercoagulability. Recommend check TEE and possible loop recorder. Check CT angiogram of the chest to further evaluate pulmonary nodule. Had a long discussion the patient and her daughter and answered questions about his stroke and pulmonary nodule evaluation. Greater than 50% time during this 35 minute t visit was spent on counseling and coordination of care about stroke and pulmonary nodule   Antony Contras, MD Medical Director [facility] Pager: [phone] 07/21/2016 2:47 PM  To contact Stroke Continuity provider, please refer to [email or web address] After hours, contact General Neurology

## 2016-07-21 NOTE — Evaluation (Addendum)
Physical Therapy Evaluation Patient Details Name: Kathleen Guerrero MRN: [id number] DOB: Jul 25, 1946 Today's Date: 07/21/2016   History of Present Illness  70 y.o. female admitted for LLE weakness. MRI (+) for R corona radiata infarct. No pertinent PMH.  Clinical Impression  Patient presents with problems listed below.  Will benefit from acute PT to maximize functional mobility prior to discharge.  Patient with specific LLE weakness impacting gait, balance, and safety.  Recommend f/u OP PT at d/c.    Follow Up Recommendations Outpatient PT;Supervision for mobility/OOB    Equipment Recommendations  Rolling walker with 5" wheels    Recommendations for Other Services       Precautions / Restrictions Precautions Precautions: Fall Precaution Comments: Decreased Lt DF strength Restrictions Weight Bearing Restrictions: No      Mobility  Bed Mobility               General bed mobility comments: Patient in chair as PT entered room  Transfers Overall transfer level: Needs assistance Equipment used: Rolling walker (2 wheeled) Transfers: Sit to/from Stand Sit to Stand: Min guard         General transfer comment: Verbal cues for hand placement - patient reaching for RW to pull up.  Assist for safety/balance during transfers.    Ambulation/Gait Ambulation/Gait assistance: Min assist Ambulation Distance (Feet): 68 Feet (68' and 24') Assistive device: Rolling walker (2 wheeled) Gait Pattern/deviations: Step-through pattern;Decreased stance time - left;Decreased step length - right;Decreased step length - left;Decreased dorsiflexion - left;Staggering left;Drifts right/left Gait velocity: decreased Gait velocity interpretation: Below normal speed for age/gender General Gait Details: Verbal cues for safe use of RW.  Cues to keep feet inside of RW, especially during turns.  Patient with unsteady gait.  Noted decreased DF on Lt during swing phase,  with foot sliding along floor and  initiating stance with foot flat - rather than heel strike.   Decreased LLE stability during stance, with knee buckling at times.  Stairs            Wheelchair Mobility    Modified Rankin (Stroke Patients Only) Modified Rankin (Stroke Patients Only) Pre-Morbid Rankin Score: No symptoms Modified Rankin: Moderately severe disability     Balance           Standing balance support: Bilateral upper extremity supported;During functional activity Standing balance-Leahy Scale: Poor Standing balance comment: UE support on RW to maintain balance.                             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  Noted ankle DF and eversion at 2+/5 with slow, uncoordinated movement.  Communication   Communication: No difficulties  Cognition Arousal/Alertness: Awake/alert Behavior During Therapy: WFL for tasks assessed/performed Overall Cognitive Status: Within Functional Limits for tasks assessed                      General Comments      Exercises General Exercises - Lower Extremity Ankle Circles/Pumps: AROM;Both;10 reps;Seated (Focusing on Lt  DF)   Assessment/Plan    PT Assessment Patient needs continued PT services  PT Problem List Decreased strength;Decreased balance;Decreased mobility;Decreased coordination;Decreased knowledge of use of DME          PT Treatment Interventions DME instruction;Gait training;Stair training;Functional mobility training;Therapeutic activities;Therapeutic exercise;Balance training;Neuromuscular re-education;Patient/family education    PT Goals (Current goals can be found in the Care Plan section)  Acute Rehab PT Goals Patient Stated Goal: to get home and be completely independent PT Goal Formulation: With patient/family Time For Goal Achievement: 07/28/16 Potential to Achieve Goals: Good    Frequency Min 4X/week   Barriers to discharge Decreased caregiver support Patient lives alone.  Daughter from [place] staying with patient at d/c.    Co-evaluation               End of Session Equipment Utilized During Treatment: Gait belt Activity Tolerance: Patient tolerated treatment well Patient left: in chair;with call bell/phone within reach;with chair alarm set;with family/visitor present Nurse Communication: Mobility status    Functional Assessment Tool Used: Clinical judgement Functional Limitation: Mobility: Walking and moving around Mobility: Walking and Moving Around Current Status (S8979): At least 20 percent but less than 40 percent impaired, limited or restricted Mobility: Walking and Moving Around Goal Status [phone]): At least 1 percent but less than 20 percent impaired, limited or restricted    Time: 3643-8377 PT Time Calculation (min) (ACUTE ONLY): 40 min - 10 min for MD = 30 min total   Charges:   PT Evaluation $PT Eval Moderate Complexity: 1 Procedure PT Treatments $Gait Training: 8-22 mins   PT G Codes:   PT G-Codes **NOT FOR INPATIENT CLASS** Functional Assessment Tool Used: Clinical judgement Functional Limitation: Mobility: Walking and moving  around Mobility: Walking and Moving Around Current Status (P3968): At least 20 percent but less than 40 percent impaired, limited or restricted Mobility: Walking and Moving Around Goal Status [phone]): At least 1 percent but less than 20 percent impaired, limited or restricted    Despina Pole 07/21/2016, 2:10 PM Carita Pian. Sanjuana Kava, [place] Pager [phone]

## 2016-07-22 ENCOUNTER — Inpatient Hospital Stay (HOSPITAL_COMMUNITY): Payer: Medicare HMO

## 2016-07-22 ENCOUNTER — Encounter (HOSPITAL_COMMUNITY): Payer: Self-pay | Admitting: Radiology

## 2016-07-22 DIAGNOSIS — I6789 Other cerebrovascular disease: Secondary | ICD-10-CM

## 2016-07-22 LAB — CBC
HEMATOCRIT: 39.9 % (ref 36.0–46.0)
HEMOGLOBIN: 12.9 g/dL (ref 12.0–15.0)
MCH: 30.1 pg (ref 26.0–34.0)
MCHC: 32.3 g/dL (ref 30.0–36.0)
MCV: 93 fL (ref 78.0–100.0)
Platelets: 237 10*3/uL (ref 150–400)
RBC: 4.29 MIL/uL (ref 3.87–5.11)
RDW: 14.6 % (ref 11.5–15.5)
WBC: 5.9 10*3/uL (ref 4.0–10.5)

## 2016-07-22 LAB — BASIC METABOLIC PANEL
Anion gap: 8 (ref 5–15)
BUN: 8 mg/dL (ref 6–20)
CALCIUM: 9.5 mg/dL (ref 8.9–10.3)
CHLORIDE: 106 mmol/L (ref 101–111)
CO2: 26 mmol/L (ref 22–32)
CREATININE: 0.72 mg/dL (ref 0.44–1.00)
GFR calc non Af Amer: >60 mL/min (ref >60)
GLUCOSE: 108 mg/dL — AB (ref 65–99)
Potassium: 3.5 mmol/L (ref 3.5–5.1)
Sodium: 140 mmol/L (ref 135–145)

## 2016-07-22 LAB — ECHOCARDIOGRAM COMPLETE

## 2016-07-22 MED ORDER — IOPAMIDOL (ISOVUE-300) INJECTION 61%
INTRAVENOUS | Status: AC
  Administered 2016-07-22: 75 mL
  Filled 2016-07-22: qty 75

## 2016-07-22 MED ORDER — ZOLPIDEM TARTRATE 5 MG PO TABS
5.0000 mg | ORAL_TABLET | Freq: Every evening | ORAL | Status: DC | PRN
Start: 2016-07-22 — End: 2016-07-23
  Administered 2016-07-22: 5 mg via ORAL
  Filled 2016-07-22: qty 1

## 2016-07-22 NOTE — Progress Notes (Signed)
  Echocardiogram 2D Echocardiogram has been performed.  Jennette Dubin 07/22/2016, 9:34 AM

## 2016-07-22 NOTE — Progress Notes (Signed)
    [facility] has been requested to perform a transesophageal echocardiogram for stroke.  After careful review of history and examination, the risks and benefits of transesophageal echocardiogram have been explained including risks of esophageal damage, perforation (1:10,000 risk), bleeding, pharyngeal hematoma as well as other potential complications associated with conscious sedation including aspiration, arrhythmia, respiratory failure and death. Alternatives to treatment were discussed, questions were answered. Patient is willing to proceed. Tentatively scheduled for 3pm tomorrow. Pt inquired if she could eat breakfast. I explained she must be NPO for at least 8 hours before procedure and sometimes they are moved up so unfortunately not. EP team to see tomorrow to consider loop. Will also have them review telemetry further - pt had brief run of narrow complex tachycardia x 2, mostly regular with minimal irregularity but episode too short to fully assess, may be atrial tach. Reviewed tele with Dr. Johnsie Cancel who recommends proceeding with TEE/loop as requested.  Charlie Pitter, PA-C 07/22/2016 4:53 PM

## 2016-07-22 NOTE — Care Management Note (Signed)
Case Management Note  Patient Details  Name: Kathleen Guerrero MRN: [id number] Date of Birth: 06-08-1946  Subjective/Objective:        Pt admitted with CVA. She is from home alone.             Action/Plan: Plan is for TEE in am. PT recommending outpatient therapy. CM following for d/c needs.  Expected Discharge Date:                  Expected Discharge Plan:  Home/Self Care  In-House Referral:     Discharge planning Services     Post Acute Care Choice:    Choice offered to:     DME Arranged:    DME Agency:     HH Arranged:    HH Agency:     Status of Service:  In process, will continue to follow  If discussed at Long Length of Stay Meetings, dates discussed:    Additional Comments:  Pollie Friar, RN 07/22/2016, 7:59 PM

## 2016-07-22 NOTE — Evaluation (Signed)
SLP Cancellation Note  Patient Details Name: MARSHAWN NINNEMAN MRN: [id number] DOB: [birth year]   Cancelled treatment:       Reason Eval/Treat Not Completed: Patient at procedure or test/unavailable (pt at TEE currently, will continue efforts)   Luanna Salk, [place] [facility] SLP [phone]

## 2016-07-22 NOTE — Evaluation (Signed)
Physical Therapy Evaluation Patient Details Name: Kathleen Guerrero MRN: [id number] DOB: [birth year] Today's Date: 07/22/2016   History of Present Illness  70 y.o. female admitted for LLE weakness. MRI (+) for R corona radiata infarct. No pertinent PMH.  Clinical Impression  Pt progressing towards all goals. Pt desired to trial cane. Pt requires increased assist with cane, pt more steady with RW, pt agrees. Acute PT to con't to follow to progress indep with cane.    Follow Up Recommendations Outpatient PT;Supervision for mobility/OOB    Equipment Recommendations  Rolling walker with 5" wheels    Recommendations for Other Services       Precautions / Restrictions Precautions Precautions: Fall Restrictions Weight Bearing Restrictions: No      Mobility  Bed Mobility               General bed mobility comments: pt up in chair  Transfers Overall transfer level: Needs assistance Equipment used: Straight cane Transfers: Sit to/from Stand Sit to Stand: Min guard         General transfer comment: v/c's for can management  Ambulation/Gait Ambulation/Gait assistance: Min guard Ambulation Distance (Feet): 100 Feet Assistive device: Straight cane Gait Pattern/deviations: Step-through pattern Gait velocity: decreaed Gait velocity interpretation: Below normal speed for age/gender General Gait Details: max directional v/c's for cane sequencing, pt mildly unsteady however has never amb with cane before, pt with good heel strike on L foot this date  Stairs            Wheelchair Mobility    Modified Rankin (Stroke Patients Only) Modified Rankin (Stroke Patients Only) Pre-Morbid Rankin Score: No symptoms Modified Rankin: Moderately severe disability     Balance Overall balance assessment: Needs assistance Sitting-balance support: Feet supported Sitting balance-Leahy Scale: Good     Standing balance support: During functional activity Standing balance-Leahy  Scale: Fair Standing balance comment: static with fair balance, needs AD for safe amb                             Pertinent Vitals/Pain Pain Assessment: No/denies pain    Home Living                        Prior Function                 Hand Dominance        Extremity/Trunk Assessment                         Communication      Cognition Arousal/Alertness: Awake/alert Behavior During Therapy: WFL for tasks assessed/performed Overall Cognitive Status: Within Functional Limits for tasks assessed                      General Comments      Exercises Other Exercises Other Exercises: worked on L SLS, x 8 trials to promote L LE strength, pt requires UE support Other Exercises: mini squats with PT assisting to place more weight through L LEx10 reps   Assessment/Plan    PT Assessment    PT Problem List            PT Treatment Interventions      PT Goals (Current goals can be found in the Care Plan section)  Acute Rehab PT Goals Patient Stated Goal: home    Frequency Min 4X/week   Barriers to discharge  Co-evaluation               End of Session Equipment Utilized During Treatment: Gait belt Activity Tolerance: Patient tolerated treatment well Patient left: in chair;with call bell/phone within reach;with chair alarm set;with family/visitor present Nurse Communication: Mobility status         Time: 1447-1520 PT Time Calculation (min) (ACUTE ONLY): 33 min   Charges:     PT Treatments $Gait Training: 8-22 mins $Neuromuscular Re-education: 8-22 mins   PT G Codes:        Kingsley Callander 07/22/2016, 4:45 PM   Kittie Plater, PT, DPT Pager #: [phone] Office #: [phone]

## 2016-07-22 NOTE — Progress Notes (Signed)
Was called into the room by the nurse tech to evaluate patient's complaint of increased left leg weakness after she got up to go to the bathroom. Neuro assessment performed and no changes were noted. The left leg continued to have flexion and extension weakness along with a slight drift. Dr. Maudie Mercury was notified and no new orders received. Will continue to monitor and notify MD as needed.

## 2016-07-22 NOTE — Progress Notes (Signed)
Occupational Therapy Treatment Patient Details Name: Kathleen Guerrero MRN: [id number] DOB: September 12, 1946 Today's Date: 07/22/2016    History of present illness 70 y.o. female admitted for LLE weakness. MRI (+) for R corona radiata infarct. No pertinent PMH.   OT comments  Pt making good progress with adls. Pt almost to Supervision level with all adls but continues to need some guarding/min assist when ambulating during adls. Pt tends to trip on L foot when swinging it through.  Spoke with pt about wearing different shoes than her rainbow flip flops. Pt needs something that provides a bit more support during adls tasks.  Follow Up Recommendations  No OT follow up;Supervision - Intermittent    Equipment Recommendations  Tub/shower seat    Recommendations for Other Services      Precautions / Restrictions Precautions Precautions: Fall Precaution Comments: Decreased Lt DF strength Restrictions Weight Bearing Restrictions: No       Mobility Bed Mobility Overal bed mobility: Modified Independent                Transfers Overall transfer level: Needs assistance Equipment used: Rolling walker (2 wheeled) Transfers: Sit to/from Stand Sit to Stand: Supervision         General transfer comment: cues to not pull up on walker.    Balance Overall balance assessment: Needs assistance Sitting-balance support: Feet supported Sitting balance-Leahy Scale: Good     Standing balance support: During functional activity;Bilateral upper extremity supported Standing balance-Leahy Scale: Fair Standing balance comment: Pt could let go at times from walker to do adls but when walking must have outside support.                   ADL Overall ADL's : Needs assistance/impaired Eating/Feeding: Independent;Sitting   Grooming: Wash/dry hands;Wash/dry face;Oral care;Supervision/safety;Standing Grooming Details (indicate cue type and reason): Pt stood at sink for 5 min Upper Body  Bathing: Modified independent;Sitting   Lower Body Bathing: Min guard;Sit to/from stand Lower Body Bathing Details (indicate cue type and reason): min guard still needed for safety. Pt continues to mildly trip over R foot at times during adls.  Pt not always aware of when she does this. Upper Body Dressing : Modified independent;Sitting   Lower Body Dressing: Min guard;Sit to/from stand Lower Body Dressing Details (indicate cue type and reason): cont to need min guard for safetly. Pt initially sturggled donning L sock due to inability to cross this leg over and hold it. Pt instructed to prop it on the bed to donn the L sock and shoes.   Toilet Transfer: Min guard;Cueing for safety;Ambulation;BSC;RW   Toileting- Clothing Manipulation and Hygiene: Supervision/safety;Sit to/from stand       Functional mobility during ADLs: Min guard;Rolling walker General ADL Comments: Pt doing well with adls. Instructed pt to dress L side first for ease of dressing and worked a lot on clearing the L foot through when walking to the bathroom/ shower etc.      Vision                 Additional Comments: Pt does not tend as well to L side athough deficit is minimal.   Perception     Praxis      Cognition   Behavior During Therapy: [facility] for tasks assessed/performed Overall Cognitive Status: Within Functional Limits for tasks assessed                       Extremity/Trunk Assessment  Exercises     Shoulder Instructions       General Comments      Pertinent Vitals/ Pain       Pain Assessment: No/denies pain  Home Living                                          Prior Functioning/Environment              Frequency  Min 2X/week        Progress Toward Goals  OT Goals(current goals can now be found in the care plan section)  Progress towards OT goals: Progressing toward goals  Acute Rehab OT Goals Patient Stated Goal: to get  home and be completely independent OT Goal Formulation: With patient Time For Goal Achievement: 08/04/16 Potential to Achieve Goals: Good ADL Goals Pt Will Perform Lower Body Dressing: with modified independence;sit to/from stand Pt Will Transfer to Toilet: with modified independence;ambulating;regular height toilet Pt Will Perform Toileting - Clothing Manipulation and hygiene: with modified independence;sit to/from stand Pt Will Perform Tub/Shower Transfer: Tub transfer;with modified independence;ambulating;shower seat;rolling walker Additional ADL Goal #1: Pt will verbalize 3 home safety strategies to minimize her fall risk at home.  Plan Frequency remains appropriate;Discharge plan remains appropriate    Co-evaluation                 End of Session Equipment Utilized During Treatment: Gait belt;Rolling walker   Activity Tolerance Patient tolerated treatment well   Patient Left in chair;with call bell/phone within reach;with chair alarm set;with family/visitor present   Nurse Communication Mobility status        Time: 5320-2334 OT Time Calculation (min): 27 min  Charges: OT General Charges $OT Visit: 1 Procedure OT Treatments $Self Care/Home Management : 23-37 mins  Glenford Peers 07/22/2016, 11:41 AM  [phone]

## 2016-07-22 NOTE — Progress Notes (Signed)
[facility]  PROGRESS NOTE  Kathleen Guerrero OMV:[id number] DOB: 1946/10/15 DOA: 07/20/2016 PCP: No primary care provider on file.   Brief HPI:  70 y.o.femalewith no known medical history significant, does not follow with doctors, who presents complaining of left lower extremity weakness that started overnight. She think weakness started probably between 9 PM and 1 AM (9/16). She woke up around 1 am to go to the bathroom, she didn't feel right, relates weakness left leg. She went back to sleep. This morning she woke up with persistent weakness of left lower extremity. MRI of the brain showed acute lacunar type infarct of the right corona radiata near motor strip. Neurology was consulted. .   Subjective    Patient seen and examined, denies worsening symptoms.    Assessment/Plan:     1. Acute lacunar infarct- patient scheduled for TEE and loop recorder, CTA head and neck unremarkable. 2-D echo showed EF 62%, grade 2 diastolic dysfunction. 2. Essential hypertension- blood pressure stable, when necessary hydralazine for  blood pressure more than 220/110. 3. Lung nodule- CT chest showed somewhat irregularly marginated nodule 1398 mm within the periphery of right upper lobe. She will need repeat CT scan in 2 months to assess the stability or resolution.     DVT prophylaxis: Lovenox  Code Status: Full code  Family Communication: No family present at bedside   Disposition Plan: Discharge home when stroke workup complete   Consultants:  Neurology  Procedures:  TEE and loop recorder   Antibiotics:   Anti-infectives    None       Objective   Vitals:   07/21/16 0730 07/21/16 1035 07/21/16 2045 07/22/16 1038  BP: (!) 166/82 (!) 160/92 (!) 146/78 138/72  Pulse: 79 85 83 80  Resp: '18 18 18 18  '$ Temp: 98.1 F (36.7 C) 98.1 F (36.7 C) 98 F (36.7 C) 98.7 F (37.1 C)  TempSrc: Oral Oral Oral Oral  SpO2: 96% 96% 96% 97%    Intake/Output Summary (Last 24 hours)  at 07/22/16 1643 Last data filed at 07/22/16 1236  Gross per 24 hour  Intake              360 ml  Output                0 ml  Net              360 ml   There were no vitals filed for this visit.   Physical Examination:  General exam: Appears calm and comfortable. Respiratory system: Clear to auscultation. Respiratory effort normal. Cardiovascular system:  RRR. No  murmurs, rubs, gallops. No pedal edema. GI system: Abdomen is nondistended, soft and nontender. No organomegaly.  Central nervous system. No focal neurological deficits. Motor strength 4/5 in the left lower extremity, 5/5 in all of the extremities Psychiatry: Alert, oriented x 3.Judgement and insight appear normal. Affect normal.    Data Reviewed: I have personally reviewed following labs and imaging studies  CBG: No results for input(s): GLUCAP in the last 168 hours.  CBC:  Recent Labs Lab 07/20/16 1042 07/22/16 0219  WBC 7.0 5.9  NEUTROABS 3.9  --   HGB 14.3 12.9  HCT 43.4 39.9  MCV 92.5 93.0  PLT 264 836    Basic Metabolic Panel:  Recent Labs Lab 07/20/16 1042 07/22/16 0219  NA 139 140  K 3.2* 3.5  CL 104 106  CO2 26 26  GLUCOSE 124* 108*  BUN 8 8  CREATININE 0.82  0.72  CALCIUM 10.1 9.5    No results found for this or any previous visit (from the past 240 hour(s)).   Liver Function Tests:  Recent Labs Lab 07/20/16 1042  AST 26  ALT 26  ALKPHOS 52  BILITOT 0.6  PROT 7.3  ALBUMIN 4.0   No results for input(s): LIPASE, AMYLASE in the last 168 hours. No results for input(s): AMMONIA in the last 168 hours.  Cardiac Enzymes: No results for input(s): CKTOTAL, CKMB, CKMBINDEX, TROPONINI in the last 168 hours. BNP (last 3 results) No results for input(s): BNP in the last 8760 hours.  ProBNP (last 3 results) No results for input(s): PROBNP in the last 8760 hours.    Studies: Ct Angio Head W Or Wo Contrast  Result Date: 07/21/2016 CLINICAL DATA:  70 y/o F; left leg weakness and  right corona radiata stroke. EXAM: CT ANGIOGRAPHY HEAD AND NECK TECHNIQUE: Multidetector CT imaging of the head and neck was performed using the standard protocol during bolus administration of intravenous contrast. Multiplanar CT image reconstructions and MIPs were obtained to evaluate the vascular anatomy. Carotid stenosis measurements (when applicable) are obtained utilizing NASCET criteria, using the distal internal carotid diameter as the denominator. CONTRAST:  50 cc Isovue 370. COMPARISON:  07/20/2016 MRI brain. FINDINGS: CTA NECK Aortic arch: Mild calcified atherosclerosis. Mixed plaque of left subclavian origin and proximal vessel with minimal stenosis. Right carotid system: Brief submucosal retropharyngeal course of the right common carotid artery. Mild calcification of the proximal internal carotid artery. Moderate tortuosity of the upper internal carotid artery. No occlusion, aneurysm, dissection, or significant stenosis is identified. Left carotid system: Brief retropharyngeal submucosal course of the common carotid artery. Moderate calcific atherosclerosis of the left carotid bifurcation. No significant stenosis. No occlusion, aneurysm, dissection, or significant stenosis is identified. Vertebral arteries:Patent bilaterally. No occlusion, aneurysm, dissection, or significant stenosis is identified. Skeleton: No acute fracture or dislocation is identified. Reversal of cervical lordosis. Grade 1 C2-3 anterolisthesis. Multilevel discogenic degenerative changes greatest from C4 through C6 with there are large marginal osteophytes and uncovertebral hypertrophy. No high-grade bony canal stenosis. Other neck: Normal thyroid gland. No lymphadenopathy or discrete cervical mass is identified. The aerodigestive tract is patent. Postinflammatory calcifications within the palatine tonsils. Upper chest: 15 mm nodule within the right upper lobe (Series 5, image 164). CTA HEAD Anterior circulation: Moderate calcific  atherosclerosis of the carotid siphons bilaterally. Mild right-sided supraclinoid stenosis. Bilateral middle cerebral arteries and anterior cerebral arteries are patent. No occlusion, aneurysm, or high-grade stenosis is identified. Posterior circulation: Bilateral vertebral arteries, the basilar artery, bilateral PICA, left AICA, bilateral SCA, and bilateral posterior cerebral arteries are patent. No occlusion, aneurysm, or significant stenosis is identified. Venous sinuses: No dural venous sinus thrombosis is identified. Anatomic variants: No anterior or posterior communicating arteries are identified, likely hypoplastic or absent. Delayed phase: No abnormal enhancement of the brain parenchyma. Lesion within the scalp measuring 11 x 23 mm in the midline parietal region is stable from prior MRI, likely sebaceous cyst. IMPRESSION: 1. Carotid and vertebral arteries of the neck are patent. No occlusion, aneurysm, dissection, or significant stenosis is identified. Mild calcified atherosclerosis of the bifurcations bilaterally. 2. Patent anterior and posterior circulations of the circle of Willis. No large vessel occlusion, aneurysm, or significant stenosis is identified. Mild intracranial atherosclerosis. 3. **An incidental finding of potential clinical significance has been found. 15 mm right upper lobe pulmonary nodule. Consider one of the following in 3 months for both low-risk and high-risk individuals: (a)  repeat chest CT, (b) follow-up PET-CT, or (c) tissue sampling. This recommendation follows the consensus statement: Guidelines for Management of Incidental Pulmonary Nodules Detected on CT Images: From the Fleischner Society 2017; Radiology 2017; 284:228-243.** 4. Moderate degenerative changes of the cervical spine. Electronically Signed   By: Kristine Garbe M.D.   On: 07/21/2016 07:05   Ct Angio Neck W Or Wo Contrast  Result Date: 07/21/2016 CLINICAL DATA:  70 y/o F; left leg weakness and right  corona radiata stroke. EXAM: CT ANGIOGRAPHY HEAD AND NECK TECHNIQUE: Multidetector CT imaging of the head and neck was performed using the standard protocol during bolus administration of intravenous contrast. Multiplanar CT image reconstructions and MIPs were obtained to evaluate the vascular anatomy. Carotid stenosis measurements (when applicable) are obtained utilizing NASCET criteria, using the distal internal carotid diameter as the denominator. CONTRAST:  50 cc Isovue 370. COMPARISON:  07/20/2016 MRI brain. FINDINGS: CTA NECK Aortic arch: Mild calcified atherosclerosis. Mixed plaque of left subclavian origin and proximal vessel with minimal stenosis. Right carotid system: Brief submucosal retropharyngeal course of the right common carotid artery. Mild calcification of the proximal internal carotid artery. Moderate tortuosity of the upper internal carotid artery. No occlusion, aneurysm, dissection, or significant stenosis is identified. Left carotid system: Brief retropharyngeal submucosal course of the common carotid artery. Moderate calcific atherosclerosis of the left carotid bifurcation. No significant stenosis. No occlusion, aneurysm, dissection, or significant stenosis is identified. Vertebral arteries:Patent bilaterally. No occlusion, aneurysm, dissection, or significant stenosis is identified. Skeleton: No acute fracture or dislocation is identified. Reversal of cervical lordosis. Grade 1 C2-3 anterolisthesis. Multilevel discogenic degenerative changes greatest from C4 through C6 with there are large marginal osteophytes and uncovertebral hypertrophy. No high-grade bony canal stenosis. Other neck: Normal thyroid gland. No lymphadenopathy or discrete cervical mass is identified. The aerodigestive tract is patent. Postinflammatory calcifications within the palatine tonsils. Upper chest: 15 mm nodule within the right upper lobe (Series 5, image 164). CTA HEAD Anterior circulation: Moderate calcific  atherosclerosis of the carotid siphons bilaterally. Mild right-sided supraclinoid stenosis. Bilateral middle cerebral arteries and anterior cerebral arteries are patent. No occlusion, aneurysm, or high-grade stenosis is identified. Posterior circulation: Bilateral vertebral arteries, the basilar artery, bilateral PICA, left AICA, bilateral SCA, and bilateral posterior cerebral arteries are patent. No occlusion, aneurysm, or significant stenosis is identified. Venous sinuses: No dural venous sinus thrombosis is identified. Anatomic variants: No anterior or posterior communicating arteries are identified, likely hypoplastic or absent. Delayed phase: No abnormal enhancement of the brain parenchyma. Lesion within the scalp measuring 11 x 23 mm in the midline parietal region is stable from prior MRI, likely sebaceous cyst. IMPRESSION: 1. Carotid and vertebral arteries of the neck are patent. No occlusion, aneurysm, dissection, or significant stenosis is identified. Mild calcified atherosclerosis of the bifurcations bilaterally. 2. Patent anterior and posterior circulations of the circle of Willis. No large vessel occlusion, aneurysm, or significant stenosis is identified. Mild intracranial atherosclerosis. 3. **An incidental finding of potential clinical significance has been found. 15 mm right upper lobe pulmonary nodule. Consider one of the following in 3 months for both low-risk and high-risk individuals: (a) repeat chest CT, (b) follow-up PET-CT, or (c) tissue sampling. This recommendation follows the consensus statement: Guidelines for Management of Incidental Pulmonary Nodules Detected on CT Images: From the Fleischner Society 2017; Radiology 2017; 284:228-243.** 4. Moderate degenerative changes of the cervical spine. Electronically Signed   By: Kristine Garbe M.D.   On: 07/21/2016 07:05   Ct Chest W Contrast  Result Date: 07/22/2016 CLINICAL DATA:  History of lung nodule, followup EXAM: CT CHEST WITH  CONTRAST TECHNIQUE: Multidetector CT imaging of the chest was performed during intravenous contrast administration. CONTRAST:  75 cc Isovue-300 COMPARISON:  Lower lobe lung nodule noted on CT of the head and neck, recent stroke FINDINGS: Cardiovascular: There is moderate thoracic aortic atherosclerosis present. The mid ascending thoracic aorta measures 33 mm in diameter. There is calcification in the distribution of the left anterior descending coronary artery and the right coronary artery. The heart is within normal limits in size. Mediastinum/Nodes: On this enhanced study, no mediastinal or hilar adenopathy is seen. The thyroid gland is within upper limits of normal. The esophagus is unremarkable. Lungs/Pleura: On lung window images, the nodule described previously which is in the periphery of the right upper lobe is again noted measuring 13 x 9 x 8 mm. The margins are somewhat irregular and neoplasm is a definite consideration although this nodule is not solid throughout its entirety. Since this nodule could be inflammatory which may result in a positive PET-CT, followup CT chest is recommended in the next 2 months to assess stability or resolution. If this nodule persists then PET-CT may be helpful to assess for metabolic activity. No other parenchymal lung nodule is seen no infiltrate or effusion is noted. The central airway is patent. Upper Abdomen: Within the upper abdomen, the liver is somewhat low in attenuation which could indicate fatty infiltration. Correlation with LFTs recommended. No calcified gallstones are seen. A nonobstructive left upper pole renal calculus is present of 4 mm in diameter Musculoskeletal: There are degenerative changes throughout the thoracic spine. No compression deformity is seen. IMPRESSION: 1. Somewhat irregularly marginated nodule of 13 x 9 x 8 mm within the periphery of the right upper lobe. No additional lung nodule is seen. Consider follow-up CT in the next 2 months to  assess stability or resolution. If this nodule persists, then PET-CT may be helpful to assess for metabolic activity. 2. Moderate thoracic aortic atherosclerosis. 3. Calcification within the left anterior descending and right coronary arteries. 4. Nonobstructing left upper pole renal calculus. 5. Question fatty infiltration of the liver.  Correlate with LFTs Electronically Signed   By: Ivar Drape M.D.   On: 07/22/2016 09:39    Scheduled Meds: . aspirin  300 mg Rectal Daily   Or  . aspirin  325 mg Oral Daily  . atorvastatin  40 mg Oral q1800  . enoxaparin (LOVENOX) injection  40 mg Subcutaneous Q24H    Continuous Infusions:   Time spent: 25 min  [facility] Pager [phone]. If 7PM-7AM, please contact night-coverage at [email or web address], Office  [phone]  password [email or web address] 07/22/2016, 4:43 PM  LOS: 1 day

## 2016-07-22 NOTE — Progress Notes (Signed)
STROKE TEAM PROGRESS NOTE   SUBJECTIVE (INTERVAL HISTORY) Patient sitting up in gerichair at the bedside. I discussed Ct chest results with her and need for outpatient f/u of pulmonary nodule.Mild left leg weakness persists   OBJECTIVE Temp:  [98 F (36.7 C)-98.1 F (36.7 C)] 98 F (36.7 C) (09/17 2045) Pulse Rate:  [83-85] 83 (09/17 2045) Cardiac Rhythm: Sinus tachycardia (09/18 0702) Resp:  [18] 18 (09/17 2045) BP: (146-160)/(78-92) 146/78 (09/17 2045) SpO2:  [96 %] 96 % (09/17 2045)  CBC:   Recent Labs Lab 07/20/16 1042 07/22/16 0219  WBC 7.0 5.9  NEUTROABS 3.9  --   HGB 14.3 12.9  HCT 43.4 39.9  MCV 92.5 93.0  PLT 264 938    Basic Metabolic Panel:   Recent Labs Lab 07/20/16 1042 07/22/16 0219  NA 139 140  K 3.2* 3.5  CL 104 106  CO2 26 26  GLUCOSE 124* 108*  BUN 8 8  CREATININE 0.82 0.72  CALCIUM 10.1 9.5    Lipid Panel:     Component Value Date/Time   CHOL 159 07/21/2016 0504   TRIG 128 07/21/2016 0504   HDL 38 (L) 07/21/2016 0504   CHOLHDL 4.2 07/21/2016 0504   VLDL 26 07/21/2016 0504   LDLCALC 95 07/21/2016 0504   HgbA1c:  Lab Results  Component Value Date   HGBA1C 5.7 (H) 07/21/2016   Urine Drug Screen: No results found for: LABOPIA, COCAINSCRNUR, LABBENZ, AMPHETMU, THCU, LABBARB    IMAGING  Ct Angio Head and Neck W Or Wo Contrast 07/21/2016 1. Carotid and vertebral arteries of the neck are patent. No occlusion, aneurysm, dissection, or significant stenosis is identified. Mild calcified atherosclerosis of the bifurcations bilaterally.  2. Patent anterior and posterior circulations of the circle of Willis. No large vessel occlusion, aneurysm, or significant stenosis is identified. Mild intracranial atherosclerosis.  3. An incidental finding of potential clinical significance has been found. 15 mm right upper lobe pulmonary nodule.  Consider one of the following in 3 months for both low-risk and high-risk individuals:  (a) repeat chest CT,   (b) follow-up PET-CT, or  (c) tissue sampling.     Ct Head Wo Contrast 07/20/2016 No evidence of acute intracranial abnormality. Atrophy and probable mild to moderate chronic small-vessel white matter ischemic changes.     Mr Brain Wo Contrast 07/20/2016 1. Acute lacunar type infarct of the right corona radiata near the motor strip. No associated hemorrhage or mass effect.  2. Moderately advanced for age nonspecific signal changes most pronounced in the white matter. Favor chronic small vessel disease related.     CT Chest with and without contrast -  . Somewhat irregularly marginated nodule of 13 x 9 x 8 mm within the periphery of the right upper lobe. No additional lung nodule is seen. Consider follow-up CT in the next 2 months to assess stability or resolution. If this nodule persists, then PET-CT may be helpful to assess for metabolic activity.    PHYSICAL EXAM  Anxious middle aged caucasian lady not in distress. . Afebrile. Head is nontraumatic. Neck is supple without bruit.    Cardiac exam no murmur or gallop. Lungs are clear to auscultation. Distal pulses are well felt.  Neurological Exam :  Awake alert oriented x 3 normal speech and language. Visual acuity seems normal. Pupils are equal and reactive. Fundi were not visualized. Visual fields are full to bedside confrontational testing. Mild left lower face asymmetry. Tongue midline. No drift. Mild diminished fine finger movements on  left. Orbits right over left upper extremity. Mild left hip flexor and ankle dorsiflexor weakness.. Normal sensation . Normal coordination.      ASSESSMENT/PLAN Kathleen Guerrero is a 70 y.o. female with history of tobacco use and hypertension presenting with left leg weakness.. She did not receive IV t-PA due to late presentation.  Stroke:  Non-dominant infarct possibly embolic from an unknown source vs small vessel disease.  Resultant very mild left hemiparesis  MRI - Acute lacunar  type infarct of the right corona radiata near the motor strip.  MRA - not performed  CTA H&N - no significant stenosis; however, incidental finding 15 mm right upper lobe pulmonary nodule.  Carotid Doppler - CTA performed 2D Echo -Left ventricle: The cavity size was normal. Systolic function was   normal. The estimated ejection fraction was in the range of 60%   to 65%. Wall motion was normal; there were no regional wall    motion abnormalitiesTEE to look for embolic source. Arranged with [place] for tomorrow - Tuesday.  If positive for PFO (patent foramen ovale), check bilateral lower extremity venous dopplers to rule out DVT as possible source of stroke. (I have made patient NPO after midnight tonight).  If TEE negative, a [place] electrophysiologist will consult and consider placement of an implantable loop recorder to evaluate for atrial fibrillation as etiology of stroke. This has been explained to patient/family by Dr. Leonie Man and they are agreeable.    LDL - 95  HgbA1c - 5.7  VTE prophylaxis - Lovenox Diet NPO time specified Except for: Sips with Meds Diet Heart Room service appropriate? Yes; Fluid consistency: Thin  No antithrombotic prior to admission, now on aspirin 325 mg daily  Patient counseled to be compliant with her antithrombotic medications  Ongoing aggressive stroke risk factor management  Therapy recommendations: Outpatient physical therapy recommended. No OT follow-up needed.   Disposition: Pending  Hypertension  Blood pressure tends to run high  Permissive hypertension (OK if < 220/120) but gradually normalize in 5-7 days  Long-term BP goal normotensive  Hyperlipidemia  Home meds: No lipid lowering medications prior to admission  LDL 95, goal < 70  Now on Lipitor 40 mg daily  Continue statin at discharge   Other Stroke Risk Factors  Advanced age  Cigarette smoker - advised to stop  smoking  Obesity, There is no height or weight on file to calculate BMI., recommend weight loss, diet and exercise as appropriate     Other Active Problems  15 mm right upper lobe pulmonary nodule. (See recommendations above)   (HEAVY SMOKER) - CT chest with irregular nodule, not clearly cancer, recommend follow up in 2 months    Patient may be hypercoagulable secondary to malignancy.   PLAN  TEE and possible loop (we'll try to schedule for tomorrow)  Hospital day # 1 I have personally examined this patient, reviewed notes, independently viewed imaging studies, participated in medical decision making and plan of care.ROS completed by me personally and pertinent positives fully documented  I have made any additions or clarifications directly to the above note. 50% time during this 25 minute visit was spent on counseling and coordination of care of [facility] TEE and CT scan of the chest  Antony Contras, [place] Pager: [phone] 07/22/2016 3:05 PM   To contact Stroke Continuity provider, please refer to [email or web address] After hours, contact General Neurology

## 2016-07-23 ENCOUNTER — Encounter (HOSPITAL_COMMUNITY): Payer: Self-pay | Admitting: *Deleted

## 2016-07-23 ENCOUNTER — Encounter (HOSPITAL_COMMUNITY)
Admission: EM | Disposition: A | Discharge status: Home / Self Care | Payer: Self-pay | Source: Home / Self Care | Attending: Family Medicine

## 2016-07-23 ENCOUNTER — Inpatient Hospital Stay (HOSPITAL_COMMUNITY): Payer: Medicare HMO

## 2016-07-23 DIAGNOSIS — I10 Essential (primary) hypertension: Secondary | ICD-10-CM

## 2016-07-23 DIAGNOSIS — I639 Cerebral infarction, unspecified: Principal | ICD-10-CM

## 2016-07-23 HISTORY — PX: EP IMPLANTABLE DEVICE: SHX172B

## 2016-07-23 HISTORY — PX: TEE WITHOUT CARDIOVERSION: SHX5443

## 2016-07-23 SURGERY — LOOP RECORDER INSERTION

## 2016-07-23 SURGERY — ECHOCARDIOGRAM, TRANSESOPHAGEAL
Anesthesia: Moderate Sedation

## 2016-07-23 MED ORDER — LIDOCAINE-EPINEPHRINE 1 %-1:100000 IJ SOLN
INTRAMUSCULAR | Status: DC | PRN
  Administered 2016-07-23: 20 mL

## 2016-07-23 MED ORDER — BUTAMBEN-TETRACAINE-BENZOCAINE 2-2-14 % EX AERO
INHALATION_SPRAY | CUTANEOUS | Status: DC | PRN
  Administered 2016-07-23: 2 via TOPICAL

## 2016-07-23 MED ORDER — MIDAZOLAM HCL 10 MG/2ML IJ SOLN
INTRAMUSCULAR | Status: DC | PRN
  Administered 2016-07-23 (×2): 2 mg via INTRAVENOUS

## 2016-07-23 MED ORDER — ATORVASTATIN CALCIUM 40 MG PO TABS: 40.0000 mg | ORAL_TABLET | Freq: Every day | ORAL | 2 refills | Status: DC

## 2016-07-23 MED ORDER — LIDOCAINE-EPINEPHRINE 1 %-1:100000 IJ SOLN
INTRAMUSCULAR | Status: AC
  Filled 2016-07-23: qty 1

## 2016-07-23 MED ORDER — FENTANYL CITRATE (PF) 100 MCG/2ML IJ SOLN
INTRAMUSCULAR | Status: DC | PRN
  Administered 2016-07-23 (×2): 25 ug via INTRAVENOUS

## 2016-07-23 MED ORDER — ASPIRIN 325 MG PO TABS: 325.0000 mg | ORAL_TABLET | Freq: Every day | ORAL | 2 refills | Status: DC

## 2016-07-23 MED ORDER — FENTANYL CITRATE (PF) 100 MCG/2ML IJ SOLN
INTRAMUSCULAR | Status: AC
  Filled 2016-07-23: qty 2

## 2016-07-23 MED ORDER — MIDAZOLAM HCL 5 MG/ML IJ SOLN
INTRAMUSCULAR | Status: AC
  Filled 2016-07-23: qty 2

## 2016-07-23 MED ORDER — UNABLE TO FIND: 0 refills | Status: DC

## 2016-07-23 MED ORDER — SODIUM CHLORIDE 0.9 % IV SOLN
INTRAVENOUS | Status: DC
  Administered 2016-07-23: 14:00:00 via INTRAVENOUS

## 2016-07-23 SURGICAL SUPPLY — 2 items
LOOP REVEAL LINQSYS (Prosthesis & Implant Heart) ×1 IMPLANT
PACK LOOP INSERTION (CUSTOM PROCEDURE TRAY) ×2 IMPLANT

## 2016-07-23 NOTE — CV Procedure (Signed)
See full TEE report in [employer] Pt sedated with versed 4 mg and fentanyl 50 micrograms IV Normal LV systolic function Negative saline microcavitation study Kirk Ruths, MD

## 2016-07-23 NOTE — Progress Notes (Signed)
   Discussed Advanced Directive w/ pt. and daughter (AD)  Pt. and daughter communicated desire to complete AD after family discussion.   Will follow up, as needed.   - Rev. [place] MDiv ThM

## 2016-07-23 NOTE — Progress Notes (Addendum)
Discharge instructions reviewed with patient/family. All questions answered at this time. [employer] arrived and explained the use of loop recorder device. Rolling walker ordered and per CM will deliver to room. All belongings sent with pt. Transport home by family.  Ave Filter, RN

## 2016-07-23 NOTE — Discharge Summary (Signed)
Physician Discharge Summary  Kathleen Guerrero PZW:[id number] DOB: 1946/06/20 DOA: 07/20/2016  PCP: No primary care provider on file.  Admit date: 07/20/2016 Discharge date: 07/23/2016  Time spent: *25 minutes  Recommendations for Outpatient Follow-up:  1. Follow-up PCP in 1 month 2. Start taking aspirin 325 mg by mouth daily 3. Start taking Lipitor 40 mg by mouth daily 4. Outpatient physical therapy schedule 5. CT chest showed irregularly marginated nodule 1398 mm within the periphery of right upper lobe. She will need repeat CT scan in 2 months to assess the stability or resolution.    Discharge Diagnoses:  Principal Problem:   Acute lacunar stroke [facility]) Active Problems:   Hypokalemia   HTN (hypertension)   Tobacco abuse   Nodule of right lung   Acute CVA (cerebrovascular accident) [facility])   Discharge Condition: Stable  Diet recommendation: Low-salt diet/heart healthy  Filed Weights   07/22/16 2003  Weight: 87.2 kg (192 lb 4.8 oz)    History of present illness:  70 y.o.femalewith no known medical history significant, does not follow with doctors, who presents complaining of left lower extremity weakness that started overnight. She think weakness started probably between 9 PM and 1 AM(9/16). She wokeup around 1 am to go to the bathroom, she didn't feel right, relates weakness left leg. She went back to sleep. This morning she wokeup with persistent weakness of left lower extremity. MRI of the brain showed acute lacunar type infarct of the right corona radiata near motor strip. Neurology was consulted.  Hospital Course:  1. Acute lacunar infarct- patient underwent  TEE, which was negative for any significant abnormality as per cardiology note, and loop recorder placed today. CTA head and neck unremarkable. 2-D echo showed EF 42%, grade 2 diastolic dysfunction. Currently on aspirin 325 mg by mouth daily. Physical therapy evaluated the patient, and recommends outpatient physical  therapy. 2. Essential hypertension- blood pressure stable, patient was started on when necessary hydralazine for  blood pressure more than 220/110. Over next few days if patient's blood pressure persistently remains elevated she might require antihypertensive therapy. 3. Lung nodule- CT chest showed somewhat irregularly marginated nodule 1398 mm within the periphery of right upper lobe. She will need repeat CT scan in 2 months to assess the stability or resolution. Discussed with patient and her daughter at bedside.   Procedures:  TEE  Consultations:  Neurology  Discharge Exam: Vitals:   07/23/16 0500 07/23/16 1006  BP: (!) 142/75 (!) 166/87  Pulse: 74 75  Resp: 16 18  Temp: 98.8 F (37.1 C) 98.8 F (37.1 C)    General: Appears in no acute distress Cardiovascular: RRR, no murmurs rubs or gallops, Respiratory: Clear to auscultation bilaterally  Discharge Instructions   Discharge Instructions    Ambulatory referral to Neurology    Complete by:  As directed    Stroke patient. Dr. Leonie Man prefers follow up in 2 months   Diet - low sodium heart healthy    Complete by:  As directed    Increase activity slowly    Complete by:  As directed      Current Discharge Medication List    START taking these medications   Details  aspirin 325 MG tablet Take 1 tablet (325 mg total) by mouth daily. Qty: 30 tablet, Refills: 2    atorvastatin (LIPITOR) 40 MG tablet Take 1 tablet (40 mg total) by mouth daily at 6 PM. Qty: 30 tablet, Refills: 2    UNABLE TO FIND Outpatient Physical therapy  3 x week Qty: 1 each, Refills: 0      STOP taking these medications     diphenhydrAMINE (BENADRYL) 25 MG tablet        No Known Allergies Follow-up Information    [facility] Follow up on 08/01/2016.   Specialty:  Cardiology Why:  at 3:30PM for wound check  Contact information: [address] [place] [phone]        SETHI,PRAMOD, MD Follow up in 2 month(s).   Specialties:  Neurology, Radiology Why:  stroke clinic. office will call you with appt date/time. Contact information: [place] [address] [phone]        [facility] .   Specialty:  Rehabilitation Why:  they will contact you for the first appointment.  Contact information: [address] [place] [id number] [place]       Lilian Coma, MD Follow up on 08/22/2016.   Specialty:  Family Medicine Why:  Your appointment time is 9:30 am.  Contact information: [place] [phone]            The results of significant diagnostics from this hospitalization (including imaging, microbiology, ancillary and laboratory) are listed below for reference.    Significant Diagnostic Studies: Ct Angio Head W Or Wo Contrast  Result Date: 07/21/2016 CLINICAL DATA:  70 y/o F; left leg weakness and right corona radiata stroke. EXAM: CT ANGIOGRAPHY HEAD AND NECK TECHNIQUE: Multidetector CT imaging of the head and neck was performed using the standard protocol during bolus administration of intravenous contrast. Multiplanar CT image reconstructions and MIPs were obtained to evaluate the vascular anatomy. Carotid stenosis measurements (when applicable) are obtained utilizing NASCET criteria, using the distal internal carotid diameter as the denominator. CONTRAST:  50 cc Isovue 370. COMPARISON:  07/20/2016 MRI brain. FINDINGS: CTA NECK Aortic arch: Mild calcified atherosclerosis. Mixed plaque of left subclavian origin and proximal vessel with minimal stenosis. Right carotid system: Brief submucosal retropharyngeal course of the right common carotid artery. Mild calcification of the proximal internal carotid artery. Moderate tortuosity of the upper internal carotid artery. No occlusion, aneurysm,  dissection, or significant stenosis is identified. Left carotid system: Brief retropharyngeal submucosal course of the common carotid artery. Moderate calcific atherosclerosis of the left carotid bifurcation. No significant stenosis. No occlusion, aneurysm, dissection, or significant stenosis is identified. Vertebral arteries:Patent bilaterally. No occlusion, aneurysm, dissection, or significant stenosis is identified. Skeleton: No acute fracture or dislocation is identified. Reversal of cervical lordosis. Grade 1 C2-3 anterolisthesis. Multilevel discogenic degenerative changes greatest from C4 through C6 with there are large marginal osteophytes and uncovertebral hypertrophy. No high-grade bony canal stenosis. Other neck: Normal thyroid gland. No lymphadenopathy or discrete cervical mass is identified. The aerodigestive tract is patent. Postinflammatory calcifications within the palatine tonsils. Upper chest: 15 mm nodule within the right upper lobe (Series 5, image 164). CTA HEAD Anterior circulation: Moderate calcific atherosclerosis of the carotid siphons bilaterally. Mild right-sided supraclinoid stenosis. Bilateral middle cerebral arteries and anterior cerebral arteries are patent. No occlusion, aneurysm, or high-grade stenosis is identified. Posterior circulation: Bilateral vertebral arteries, the basilar artery, bilateral PICA, left AICA, bilateral SCA, and bilateral posterior cerebral arteries are patent. No occlusion, aneurysm, or significant stenosis is identified. Venous sinuses: No dural venous sinus thrombosis is identified. Anatomic variants: No anterior or posterior communicating arteries are identified, likely hypoplastic or absent. Delayed phase: No abnormal  enhancement of the brain parenchyma. Lesion within the scalp measuring 11 x 23 mm in the midline parietal region is stable from prior MRI, likely sebaceous cyst. IMPRESSION: 1. Carotid and vertebral arteries of the neck are patent. No  occlusion, aneurysm, dissection, or significant stenosis is identified. Mild calcified atherosclerosis of the bifurcations bilaterally. 2. Patent anterior and posterior circulations of the circle of Willis. No large vessel occlusion, aneurysm, or significant stenosis is identified. Mild intracranial atherosclerosis. 3. **An incidental finding of potential clinical significance has been found. 15 mm right upper lobe pulmonary nodule. Consider one of the following in 3 months for both low-risk and high-risk individuals: (a) repeat chest CT, (b) follow-up PET-CT, or (c) tissue sampling. This recommendation follows the consensus statement: Guidelines for Management of Incidental Pulmonary Nodules Detected on CT Images: From the Fleischner Society 2017; Radiology 2017; 284:228-243.** 4. Moderate degenerative changes of the cervical spine. Electronically Signed   By: Kristine Garbe M.D.   On: 07/21/2016 07:05   Ct Head Wo Contrast  Result Date: 07/20/2016 CLINICAL DATA:  70 year old female with acute left leg numbness and weakness today. EXAM: CT HEAD WITHOUT CONTRAST TECHNIQUE: Contiguous axial images were obtained from the base of the skull through the vertex without intravenous contrast. COMPARISON:  None. FINDINGS: Brain: No evidence of acute infarction, hemorrhage, hydrocephalus, extra-axial collection or mass lesion/mass effect. Mild atrophy and probable mild to moderate chronic small-vessel white matter ischemic changes noted. Vascular: Intracranial vascular calcifications noted. Skull: No acute abnormalities. A 2 cm posterior scalp lesion probably represents a partially calcified sebaceous cyst. Sinuses/Orbits: No acute finding. Other: None. IMPRESSION: No evidence of acute intracranial abnormality. Atrophy and probable mild to moderate chronic small-vessel white matter ischemic changes. Electronically Signed   By: Margarette Canada M.D.   On: 07/20/2016 12:39   Ct Angio Neck W Or Wo Contrast  Result  Date: 07/21/2016 CLINICAL DATA:  70 y/o F; left leg weakness and right corona radiata stroke. EXAM: CT ANGIOGRAPHY HEAD AND NECK TECHNIQUE: Multidetector CT imaging of the head and neck was performed using the standard protocol during bolus administration of intravenous contrast. Multiplanar CT image reconstructions and MIPs were obtained to evaluate the vascular anatomy. Carotid stenosis measurements (when applicable) are obtained utilizing NASCET criteria, using the distal internal carotid diameter as the denominator. CONTRAST:  50 cc Isovue 370. COMPARISON:  07/20/2016 MRI brain. FINDINGS: CTA NECK Aortic arch: Mild calcified atherosclerosis. Mixed plaque of left subclavian origin and proximal vessel with minimal stenosis. Right carotid system: Brief submucosal retropharyngeal course of the right common carotid artery. Mild calcification of the proximal internal carotid artery. Moderate tortuosity of the upper internal carotid artery. No occlusion, aneurysm, dissection, or significant stenosis is identified. Left carotid system: Brief retropharyngeal submucosal course of the common carotid artery. Moderate calcific atherosclerosis of the left carotid bifurcation. No significant stenosis. No occlusion, aneurysm, dissection, or significant stenosis is identified. Vertebral arteries:Patent bilaterally. No occlusion, aneurysm, dissection, or significant stenosis is identified. Skeleton: No acute fracture or dislocation is identified. Reversal of cervical lordosis. Grade 1 C2-3 anterolisthesis. Multilevel discogenic degenerative changes greatest from C4 through C6 with there are large marginal osteophytes and uncovertebral hypertrophy. No high-grade bony canal stenosis. Other neck: Normal thyroid gland. No lymphadenopathy or discrete cervical mass is identified. The aerodigestive tract is patent. Postinflammatory calcifications within the palatine tonsils. Upper chest: 15 mm nodule within the right upper lobe (Series  5, image 164). CTA HEAD Anterior circulation: Moderate calcific atherosclerosis of the carotid siphons bilaterally. Mild right-sided  supraclinoid stenosis. Bilateral middle cerebral arteries and anterior cerebral arteries are patent. No occlusion, aneurysm, or high-grade stenosis is identified. Posterior circulation: Bilateral vertebral arteries, the basilar artery, bilateral PICA, left AICA, bilateral SCA, and bilateral posterior cerebral arteries are patent. No occlusion, aneurysm, or significant stenosis is identified. Venous sinuses: No dural venous sinus thrombosis is identified. Anatomic variants: No anterior or posterior communicating arteries are identified, likely hypoplastic or absent. Delayed phase: No abnormal enhancement of the brain parenchyma. Lesion within the scalp measuring 11 x 23 mm in the midline parietal region is stable from prior MRI, likely sebaceous cyst. IMPRESSION: 1. Carotid and vertebral arteries of the neck are patent. No occlusion, aneurysm, dissection, or significant stenosis is identified. Mild calcified atherosclerosis of the bifurcations bilaterally. 2. Patent anterior and posterior circulations of the circle of Willis. No large vessel occlusion, aneurysm, or significant stenosis is identified. Mild intracranial atherosclerosis. 3. **An incidental finding of potential clinical significance has been found. 15 mm right upper lobe pulmonary nodule. Consider one of the following in 3 months for both low-risk and high-risk individuals: (a) repeat chest CT, (b) follow-up PET-CT, or (c) tissue sampling. This recommendation follows the consensus statement: Guidelines for Management of Incidental Pulmonary Nodules Detected on CT Images: From the Fleischner Society 2017; Radiology 2017; 284:228-243.** 4. Moderate degenerative changes of the cervical spine. Electronically Signed   By: Kristine Garbe M.D.   On: 07/21/2016 07:05   Ct Chest W Contrast  Result Date:  07/22/2016 CLINICAL DATA:  History of lung nodule, followup EXAM: CT CHEST WITH CONTRAST TECHNIQUE: Multidetector CT imaging of the chest was performed during intravenous contrast administration. CONTRAST:  75 cc Isovue-300 COMPARISON:  Lower lobe lung nodule noted on CT of the head and neck, recent stroke FINDINGS: Cardiovascular: There is moderate thoracic aortic atherosclerosis present. The mid ascending thoracic aorta measures 33 mm in diameter. There is calcification in the distribution of the left anterior descending coronary artery and the right coronary artery. The heart is within normal limits in size. Mediastinum/Nodes: On this enhanced study, no mediastinal or hilar adenopathy is seen. The thyroid gland is within upper limits of normal. The esophagus is unremarkable. Lungs/Pleura: On lung window images, the nodule described previously which is in the periphery of the right upper lobe is again noted measuring 13 x 9 x 8 mm. The margins are somewhat irregular and neoplasm is a definite consideration although this nodule is not solid throughout its entirety. Since this nodule could be inflammatory which may result in a positive PET-CT, followup CT chest is recommended in the next 2 months to assess stability or resolution. If this nodule persists then PET-CT may be helpful to assess for metabolic activity. No other parenchymal lung nodule is seen no infiltrate or effusion is noted. The central airway is patent. Upper Abdomen: Within the upper abdomen, the liver is somewhat low in attenuation which could indicate fatty infiltration. Correlation with LFTs recommended. No calcified gallstones are seen. A nonobstructive left upper pole renal calculus is present of 4 mm in diameter Musculoskeletal: There are degenerative changes throughout the thoracic spine. No compression deformity is seen. IMPRESSION: 1. Somewhat irregularly marginated nodule of 13 x 9 x 8 mm within the periphery of the right upper lobe. No  additional lung nodule is seen. Consider follow-up CT in the next 2 months to assess stability or resolution. If this nodule persists, then PET-CT may be helpful to assess for metabolic activity. 2. Moderate thoracic aortic atherosclerosis. 3. Calcification within the  left anterior descending and right coronary arteries. 4. Nonobstructing left upper pole renal calculus. 5. Question fatty infiltration of the liver.  Correlate with LFTs Electronically Signed   By: Ivar Drape M.D.   On: 07/22/2016 09:39   Mr Brain Wo Contrast  Result Date: 07/20/2016 CLINICAL DATA:  70 year old female with acute onset left lower extremity weakness since 0100 hours. Went to bed normal at 2100 hours yesterday. Initial encounter. EXAM: MRI HEAD WITHOUT CONTRAST TECHNIQUE: Multiplanar, multiecho pulse sequences of the brain and surrounding structures were obtained without intravenous contrast. COMPARISON:  Head CT without contrast 1153 hours today. FINDINGS: Brain: There is a linear 2 cm area of restricted diffusion in the right centrum semiovale near the peri-Rolandic cortex (series 3, image 38 and series 8, image 14). Faint associated T2 and FLAIR hyperintensity. No associated hemorrhage or mass effect. No other restricted diffusion. No midline shift, mass effect, evidence of mass lesion, ventriculomegaly, extra-axial collection or acute intracranial hemorrhage. Cervicomedullary junction and pituitary are within normal limits. Widely scattered and patchy bilateral cerebral white matter T2 and FLAIR hyperintensity. No cortical encephalomalacia no chronic cerebral blood products identified. Comparatively mild T2 heterogeneity in the deep gray matter nuclei and pons. Otherwise the brainstem and cerebellum are normal. Vascular: Major intracranial vascular flow voids are preserved, with a degree of generalized intracranial artery dolichoectasia, especially in the posterior circulation. Skull and upper cervical spine: Multilevel chronic  disc and endplate degeneration in the visible cervical spine. Mild anterolisthesis at C2-C3. Visualized bone marrow signal is within normal limits. Sinuses/Orbits: Posterior scalp soft tissue probable sebaceous cyst, a 2.6 cm. Other scalp soft tissues appear within normal limits. Negative orbits soft tissues. Mild mucosal thickening left sphenoid sinus. Other paranasal sinuses are clear. Other: Visible internal auditory structures appear normal. Mastoids are clear. IMPRESSION: 1. Acute lacunar type infarct of the right corona radiata near the motor strip. No associated hemorrhage or mass effect. 2. Moderately advanced for age nonspecific signal changes most pronounced in the white matter. Favor chronic small vessel disease related. Electronically Signed   By: Genevie Ann M.D.   On: 07/20/2016 17:18    Microbiology: No results found for this or any previous visit (from the past 240 hour(s)).   Labs: Basic Metabolic Panel:  Recent Labs Lab 07/20/16 1042 07/22/16 0219  NA 139 140  K 3.2* 3.5  CL 104 106  CO2 26 26  GLUCOSE 124* 108*  BUN 8 8  CREATININE 0.82 0.72  CALCIUM 10.1 9.5   Liver Function Tests:  Recent Labs Lab 07/20/16 1042  AST 26  ALT 26  ALKPHOS 52  BILITOT 0.6  PROT 7.3  ALBUMIN 4.0   No results for input(s): LIPASE, AMYLASE in the last 168 hours. No results for input(s): AMMONIA in the last 168 hours. CBC:  Recent Labs Lab 07/20/16 1042 07/22/16 0219  WBC 7.0 5.9  NEUTROABS 3.9  --   HGB 14.3 12.9  HCT 43.4 39.9  MCV 92.5 93.0  PLT 264 237       Signed:  Kiev Labrosse S MD.  [facility] 07/23/2016, 2:19 PM

## 2016-07-23 NOTE — Care Management Note (Signed)
Case Management Note  Patient Details  Name: Kathleen Guerrero MRN: [id number] Date of Birth: Mar 14, 1946  Subjective/Objective:                  CVA  Action/Plan: Rolling walker delivered to patient. Patient's daughter will assist her at home.   Expected Discharge Date:    07/23/16              Expected Discharge Plan:  Home/Self Care  In-House Referral:     Discharge planning Services     Post Acute Care Choice:    Choice offered to:     DME Arranged:  Walker rolling DME Agency:  [place]:    [facility] Agency:     Status of Service:  Completed, signed off  If discussed at [place] of Stay Meetings, dates discussed:    Additional Comments:  Apolonio Schneiders, RN 07/23/2016, 7:12 PM

## 2016-07-23 NOTE — Progress Notes (Signed)
PT Cancellation Note  Patient Details Name: LYLEE CORROW MRN: [id number] DOB: Mar 25, 1946   Cancelled Treatment:    Reason Eval/Treat Not Completed: Patient at procedure or test/unavailable. Pt off floor at TEE. PT to return as able.   Kingsley Callander 07/23/2016, 3:23 PM   Kittie Plater, PT, DPT Pager #: [phone] Office #: [phone]

## 2016-07-23 NOTE — Progress Notes (Signed)
TEE information given. Consent signed and in chart. Pt has been NPO since MN.    Ave Filter, RN

## 2016-07-23 NOTE — Consult Note (Signed)
ELECTROPHYSIOLOGY CONSULT NOTE  Patient ID: Kathleen Guerrero MRN: [id number], DOB/AGE: 1946/05/07   Admit date: 07/20/2016 Date of Consult: 07/23/2016  Primary Physician: No primary care provider on file. Primary Cardiologist: new to [place] Reason for Consultation: Cryptogenic stroke; recommendations regarding Implantable Loop Recorder  History of Present Illness Kathleen Guerrero was admitted on 07/20/2016 with left leg weakness. Imaging demonstrated non dominant infarct felt to be embolic 2/2 unknown.  she has undergone workup for stroke including echocardiogram and carotid dopplers.  The patient has been monitored on telemetry which has demonstrated sinus rhythm with brief run of atrial tachycardia.  Inpatient stroke work-up is to be completed with a TEE.   Echocardiogram this admission demonstrated EF 74-12%, grade 2 diastolic dysfunction, LA 34.  Lab work is reviewed.  Prior to admission, the patient denies chest pain, shortness of breath, dizziness, or syncope. She does have occasional tachypalpitations that are short in duration and occur infrequently (less than monthly).  They are recovering from their stroke with plans to return home at discharge.  EP has been asked to evaluate for placement of an implantable loop recorder to monitor for atrial fibrillation.  History reviewed. No pertinent past medical history.   Surgical History: History reviewed. No pertinent surgical history.   Prescriptions Prior to Admission  Medication Sig Dispense Refill Last Dose  . diphenhydrAMINE (BENADRYL) 25 MG tablet Take 50 mg by mouth at bedtime.   07/19/2016 at Unknown time    Inpatient Medications:  . aspirin  300 mg Rectal Daily   Or  . aspirin  325 mg Oral Daily  . atorvastatin  40 mg Oral q1800  . enoxaparin (LOVENOX) injection  40 mg Subcutaneous Q24H    Allergies: No Known Allergies  Social History   Social History  . Marital status: Divorced    Spouse name: N/A  . Number of  children: N/A  . Years of education: N/A   Occupational History  . Not on file.   Social History Main Topics  . Smoking status: Current Every Day Smoker  . Smokeless tobacco: Never Used  . Alcohol use Not on file  . Drug use: Unknown  . Sexual activity: Not on file   Other Topics Concern  . Not on file   Social History Narrative  . No narrative on file     Family History: no premature CAD     Review of Systems: All other systems reviewed and are otherwise negative except as noted above.  Physical Exam: Vitals:   07/22/16 2003 07/22/16 2137 07/23/16 0154 07/23/16 0500  BP:  (!) 148/74 137/78 (!) 142/75  Pulse:  82 74 74  Resp:  '17 16 16  '$ Temp:  98.8 F (37.1 C) 98.6 F (37 C) 98.8 F (37.1 C)  TempSrc:  Oral Oral Oral  SpO2:  98% 98% 99%  Weight: 192 lb 4.8 oz (87.2 kg)     Height: '5\' 5"'$  (1.651 m)       GEN- The patient is well appearing, alert and oriented x 3 today.   Head- normocephalic, atraumatic Eyes-  Sclera clear, conjunctiva pink Ears- hearing intact Oropharynx- clear Neck- supple Lungs- Clear to ausculation bilaterally, normal work of breathing Heart- Regular rate and rhythm, no murmurs, rubs or gallops  GI- soft, NT, ND, + BS Extremities- no clubbing, cyanosis, or edema MS- no significant deformity or atrophy Skin- no rash or lesion Psych- euthymic mood, full affect   Labs:   Lab Results  Component Value  Date   WBC 5.9 07/22/2016   HGB 12.9 07/22/2016   HCT 39.9 07/22/2016   MCV 93.0 07/22/2016   PLT 237 07/22/2016     Recent Labs Lab 07/20/16 1042 07/22/16 0219  NA 139 140  K 3.2* 3.5  CL 104 106  CO2 26 26  BUN 8 8  CREATININE 0.82 0.72  CALCIUM 10.1 9.5  PROT 7.3  --   BILITOT 0.6  --   ALKPHOS 52  --   ALT 26  --   AST 26  --   GLUCOSE 124* 108*     Radiology/Studies: Ct Angio Head W Or Wo Contrast Result Date: 07/21/2016 CLINICAL DATA:  70 y/o F; left leg weakness and right corona radiata stroke. EXAM: CT  ANGIOGRAPHY HEAD AND NECK TECHNIQUE: Multidetector CT imaging of the head and neck was performed using the standard protocol during bolus administration of intravenous contrast. Multiplanar CT image reconstructions and MIPs were obtained to evaluate the vascular anatomy. Carotid stenosis measurements (when applicable) are obtained utilizing NASCET criteria, using the distal internal carotid diameter as the denominator. CONTRAST:  50 cc Isovue 370. COMPARISON:  07/20/2016 MRI brain. FINDINGS: CTA NECK Aortic arch: Mild calcified atherosclerosis. Mixed plaque of left subclavian origin and proximal vessel with minimal stenosis. Right carotid system: Brief submucosal retropharyngeal course of the right common carotid artery. Mild calcification of the proximal internal carotid artery. Moderate tortuosity of the upper internal carotid artery. No occlusion, aneurysm, dissection, or significant stenosis is identified. Left carotid system: Brief retropharyngeal submucosal course of the common carotid artery. Moderate calcific atherosclerosis of the left carotid bifurcation. No significant stenosis. No occlusion, aneurysm, dissection, or significant stenosis is identified. Vertebral arteries:Patent bilaterally. No occlusion, aneurysm, dissection, or significant stenosis is identified. Skeleton: No acute fracture or dislocation is identified. Reversal of cervical lordosis. Grade 1 C2-3 anterolisthesis. Multilevel discogenic degenerative changes greatest from C4 through C6 with there are large marginal osteophytes and uncovertebral hypertrophy. No high-grade bony canal stenosis. Other neck: Normal thyroid gland. No lymphadenopathy or discrete cervical mass is identified. The aerodigestive tract is patent. Postinflammatory calcifications within the palatine tonsils. Upper chest: 15 mm nodule within the right upper lobe (Series 5, image 164). CTA HEAD Anterior circulation: Moderate calcific atherosclerosis of the carotid siphons  bilaterally. Mild right-sided supraclinoid stenosis. Bilateral middle cerebral arteries and anterior cerebral arteries are patent. No occlusion, aneurysm, or high-grade stenosis is identified. Posterior circulation: Bilateral vertebral arteries, the basilar artery, bilateral PICA, left AICA, bilateral SCA, and bilateral posterior cerebral arteries are patent. No occlusion, aneurysm, or significant stenosis is identified. Venous sinuses: No dural venous sinus thrombosis is identified. Anatomic variants: No anterior or posterior communicating arteries are identified, likely hypoplastic or absent. Delayed phase: No abnormal enhancement of the brain parenchyma. Lesion within the scalp measuring 11 x 23 mm in the midline parietal region is stable from prior MRI, likely sebaceous cyst. IMPRESSION: 1. Carotid and vertebral arteries of the neck are patent. No occlusion, aneurysm, dissection, or significant stenosis is identified. Mild calcified atherosclerosis of the bifurcations bilaterally. 2. Patent anterior and posterior circulations of the circle of Willis. No large vessel occlusion, aneurysm, or significant stenosis is identified. Mild intracranial atherosclerosis. 3. **An incidental finding of potential clinical significance has been found. 15 mm right upper lobe pulmonary nodule. Consider one of the following in 3 months for both low-risk and high-risk individuals: (a) repeat chest CT, (b) follow-up PET-CT, or (c) tissue sampling. This recommendation follows the consensus statement: Guidelines for Management of  Incidental Pulmonary Nodules Detected on CT Images: From the Fleischner Society 2017; Radiology 2017; 284:228-243.** 4. Moderate degenerative changes of the cervical spine. Electronically Signed   By: Kristine Garbe M.D.   On: 07/21/2016 07:05   Ct Head Wo Contrast Result Date: 07/20/2016 CLINICAL DATA:  70 year old female with acute left leg numbness and weakness today. EXAM: CT HEAD WITHOUT  CONTRAST TECHNIQUE: Contiguous axial images were obtained from the base of the skull through the vertex without intravenous contrast. COMPARISON:  None. FINDINGS: Brain: No evidence of acute infarction, hemorrhage, hydrocephalus, extra-axial collection or mass lesion/mass effect. Mild atrophy and probable mild to moderate chronic small-vessel white matter ischemic changes noted. Vascular: Intracranial vascular calcifications noted. Skull: No acute abnormalities. A 2 cm posterior scalp lesion probably represents a partially calcified sebaceous cyst. Sinuses/Orbits: No acute finding. Other: None. IMPRESSION: No evidence of acute intracranial abnormality. Atrophy and probable mild to moderate chronic small-vessel white matter ischemic changes. Electronically Signed   By: Margarette Canada M.D.   On: 07/20/2016 12:39   Ct Angio Neck W Or Wo Contrast Result Date: 07/21/2016 CLINICAL DATA:  70 y/o F; left leg weakness and right corona radiata stroke. EXAM: CT ANGIOGRAPHY HEAD AND NECK TECHNIQUE: Multidetector CT imaging of the head and neck was performed using the standard protocol during bolus administration of intravenous contrast. Multiplanar CT image reconstructions and MIPs were obtained to evaluate the vascular anatomy. Carotid stenosis measurements (when applicable) are obtained utilizing NASCET criteria, using the distal internal carotid diameter as the denominator. CONTRAST:  50 cc Isovue 370. COMPARISON:  07/20/2016 MRI brain. FINDINGS: CTA NECK Aortic arch: Mild calcified atherosclerosis. Mixed plaque of left subclavian origin and proximal vessel with minimal stenosis. Right carotid system: Brief submucosal retropharyngeal course of the right common carotid artery. Mild calcification of the proximal internal carotid artery. Moderate tortuosity of the upper internal carotid artery. No occlusion, aneurysm, dissection, or significant stenosis is identified. Left carotid system: Brief retropharyngeal submucosal course  of the common carotid artery. Moderate calcific atherosclerosis of the left carotid bifurcation. No significant stenosis. No occlusion, aneurysm, dissection, or significant stenosis is identified. Vertebral arteries:Patent bilaterally. No occlusion, aneurysm, dissection, or significant stenosis is identified. Skeleton: No acute fracture or dislocation is identified. Reversal of cervical lordosis. Grade 1 C2-3 anterolisthesis. Multilevel discogenic degenerative changes greatest from C4 through C6 with there are large marginal osteophytes and uncovertebral hypertrophy. No high-grade bony canal stenosis. Other neck: Normal thyroid gland. No lymphadenopathy or discrete cervical mass is identified. The aerodigestive tract is patent. Postinflammatory calcifications within the palatine tonsils. Upper chest: 15 mm nodule within the right upper lobe (Series 5, image 164). CTA HEAD Anterior circulation: Moderate calcific atherosclerosis of the carotid siphons bilaterally. Mild right-sided supraclinoid stenosis. Bilateral middle cerebral arteries and anterior cerebral arteries are patent. No occlusion, aneurysm, or high-grade stenosis is identified. Posterior circulation: Bilateral vertebral arteries, the basilar artery, bilateral PICA, left AICA, bilateral SCA, and bilateral posterior cerebral arteries are patent. No occlusion, aneurysm, or significant stenosis is identified. Venous sinuses: No dural venous sinus thrombosis is identified. Anatomic variants: No anterior or posterior communicating arteries are identified, likely hypoplastic or absent. Delayed phase: No abnormal enhancement of the brain parenchyma. Lesion within the scalp measuring 11 x 23 mm in the midline parietal region is stable from prior MRI, likely sebaceous cyst. IMPRESSION: 1. Carotid and vertebral arteries of the neck are patent. No occlusion, aneurysm, dissection, or significant stenosis is identified. Mild calcified atherosclerosis of the  bifurcations bilaterally. 2. Patent anterior  and posterior circulations of the circle of Willis. No large vessel occlusion, aneurysm, or significant stenosis is identified. Mild intracranial atherosclerosis. 3. **An incidental finding of potential clinical significance has been found. 15 mm right upper lobe pulmonary nodule. Consider one of the following in 3 months for both low-risk and high-risk individuals: (a) repeat chest CT, (b) follow-up PET-CT, or (c) tissue sampling. This recommendation follows the consensus statement: Guidelines for Management of Incidental Pulmonary Nodules Detected on CT Images: From the Fleischner Society 2017; Radiology 2017; 284:228-243.** 4. Moderate degenerative changes of the cervical spine. Electronically Signed   By: Kristine Garbe M.D.   On: 07/21/2016 07:05   12-lead ECG sinus rhythm, rate 96 All prior EKG's in EPIC reviewed with no documented atrial fibrillation  Telemetry sinus rhythm, brief run AT  Assessment and Plan:  1. Cryptogenic stroke The patient presents with cryptogenic stroke.  The patient has a TEE planned for this AM.  I spoke at length with the patient about monitoring for afib with either a 30 day event monitor or an implantable loop recorder.  With brief run of atrial tachycardia, 30 day monitor is reasonable as well as ILR.  She is clear in her decision to pursue ILR for increased long-term information available.  Risks, benefits, and alteratives to implantable loop recorder were discussed with the patient today.   At this time, the patient is very clear in their decision to proceed with implantable loop recorder.   Wound care was reviewed with the patient (keep incision clean and dry for 3 days).  Wound check scheduled for 08/01/16 at 3:30PM   Please call with questions.   Chanetta Marshall, NP 07/23/2016 9:49 AM  I have seen, examined the patient, and reviewed the above assessment and plan.  Changes to above are made where necessary.   On exam, RRR.  Risks, benefits, and alternatives to ILR placement were discussed with the patient who wishes to proceed.  Co Sign: Thompson Grayer, MD 07/23/2016 12:37 PM

## 2016-07-23 NOTE — Progress Notes (Signed)
Patient arrived back to floor by bed.  MD paged of arrival. Patient alert, denies pain or discomfort.  States she is hungry and ready to eat.  Dinner tray given and set up.  Dressing site intact clean, dry and intact.  Will continue to monitor.

## 2016-07-23 NOTE — Progress Notes (Signed)
  Echocardiogram Echocardiogram Transesophageal has been performed.  Donata Clay 07/23/2016, 3:32 PM

## 2016-07-23 NOTE — Progress Notes (Signed)
[facility] arrived. transport home per family. All belongings sent.  Ave Filter, RN

## 2016-07-23 NOTE — Progress Notes (Signed)
STROKE TEAM PROGRESS NOTE   SUBJECTIVE (INTERVAL HISTORY) Patient sitting up in bedsidechair  . I discussed Ct chest results with her and her daughter and need for outpatient f/u of pulmonary nodule.Mild left leg weakness persists. TEE and loop later today   OBJECTIVE Temp:  [98.6 F (37 C)-98.9 F (37.2 C)] 98.8 F (37.1 C) (09/19 1006) Pulse Rate:  [74-92] 75 (09/19 1006) Cardiac Rhythm: Normal sinus rhythm (09/18 1900) Resp:  [16-18] 18 (09/19 1006) BP: (130-174)/(68-87) 166/87 (09/19 1006) SpO2:  [98 %-99 %] 98 % (09/19 1006) Weight:  [192 lb 4.8 oz (87.2 kg)] 192 lb 4.8 oz (87.2 kg) (09/18 2003)  CBC:   Recent Labs Lab 07/20/16 1042 07/22/16 0219  WBC 7.0 5.9  NEUTROABS 3.9  --   HGB 14.3 12.9  HCT 43.4 39.9  MCV 92.5 93.0  PLT 264 622    Basic Metabolic Panel:   Recent Labs Lab 07/20/16 1042 07/22/16 0219  NA 139 140  K 3.2* 3.5  CL 104 106  CO2 26 26  GLUCOSE 124* 108*  BUN 8 8  CREATININE 0.82 0.72  CALCIUM 10.1 9.5    Lipid Panel:     Component Value Date/Time   CHOL 159 07/21/2016 0504   TRIG 128 07/21/2016 0504   HDL 38 (L) 07/21/2016 0504   CHOLHDL 4.2 07/21/2016 0504   VLDL 26 07/21/2016 0504   LDLCALC 95 07/21/2016 0504   HgbA1c:  Lab Results  Component Value Date   HGBA1C 5.7 (H) 07/21/2016   Urine Drug Screen: No results found for: LABOPIA, COCAINSCRNUR, LABBENZ, AMPHETMU, THCU, LABBARB    IMAGING  Ct Angio Head and Neck W Or Wo Contrast 07/21/2016 1. Carotid and vertebral arteries of the neck are patent. No occlusion, aneurysm, dissection, or significant stenosis is identified. Mild calcified atherosclerosis of the bifurcations bilaterally.  2. Patent anterior and posterior circulations of the circle of Willis. No large vessel occlusion, aneurysm, or significant stenosis is identified. Mild intracranial atherosclerosis.  3. An incidental finding of potential clinical significance has been found. 15 mm right upper lobe pulmonary  nodule.  Consider one of the following in 3 months for both low-risk and high-risk individuals:  (a) repeat chest CT,  (b) follow-up PET-CT, or  (c) tissue sampling.     Ct Head Wo Contrast 07/20/2016 No evidence of acute intracranial abnormality. Atrophy and probable mild to moderate chronic small-vessel white matter ischemic changes.     Mr Brain Wo Contrast 07/20/2016 1. Acute lacunar type infarct of the right corona radiata near the motor strip. No associated hemorrhage or mass effect.  2. Moderately advanced for age nonspecific signal changes most pronounced in the white matter. Favor chronic small vessel disease related.     CT Chest with and without contrast -  . Somewhat irregularly marginated nodule of 13 x 9 x 8 mm within the periphery of the right upper lobe. No additional lung nodule is seen. Consider follow-up CT in the next 2 months to assess stability or resolution. If this nodule persists, then PET-CT may be helpful to assess for metabolic activity.    PHYSICAL EXAM  Anxious middle aged caucasian lady not in distress. . Afebrile. Head is nontraumatic. Neck is supple without bruit.    Cardiac exam no murmur or gallop. Lungs are clear to auscultation. Distal pulses are well felt.  Neurological Exam :  Awake alert oriented x 3 normal speech and language. Visual acuity seems normal. Pupils are equal and reactive. Fundi were  not visualized. Visual fields are full to bedside confrontational testing. Mild left lower face asymmetry. Tongue midline. No drift. Mild diminished fine finger movements on left. Orbits right over left upper extremity. Mild left hip flexor and ankle dorsiflexor weakness.. Normal sensation . Normal coordination.      ASSESSMENT/PLAN Ms. Kathleen Guerrero is a 70 y.o. female with history of tobacco use and hypertension presenting with left leg weakness.. She did not receive IV t-PA due to late presentation.  Stroke:  Non-dominant infarct possibly  embolic from an unknown source vs small vessel disease.  Resultant very mild left hemiparesis  MRI - Acute lacunar type infarct of the right corona radiata near the motor strip.  MRA - not performed  CTA H&N - no significant stenosis; however, incidental finding 15 mm right upper lobe pulmonary nodule.  Carotid Doppler - CTA performed 2D Echo -Left ventricle: The cavity size was normal. Systolic function was   normal. The estimated ejection fraction was in the range of 60%   to 65%. Wall motion was normal; there were no regional wall    motion abnormalitiesTEE to look for embolic source. Arranged with [facility] for tomorrow - Tuesday.  If positive for PFO (patent foramen ovale), check bilateral lower extremity venous dopplers to rule out DVT as possible source of stroke. (I have made patient NPO after midnight tonight).  If TEE negative, a [facility] electrophysiologist will consult and consider placement of an implantable loop recorder to evaluate for atrial fibrillation as etiology of stroke. This has been explained to patient/family by Dr. Leonie Man and they are agreeable.    LDL - 95  HgbA1c - 5.7  VTE prophylaxis - Lovenox Diet NPO time specified  No antithrombotic prior to admission, now on aspirin 325 mg daily  Patient counseled to be compliant with her antithrombotic medications  Ongoing aggressive stroke risk factor management  Therapy recommendations: Outpatient physical therapy recommended. No OT follow-up needed.  Disposition: home Hypertension  Blood pressure tends to run high  Permissive hypertension (OK if < 220/120) but gradually normalize in 5-7 days  Long-term BP goal normotensive  Hyperlipidemia  Home meds: No lipid lowering medications prior to admission  LDL 95, goal < 70  Now on Lipitor 40 mg daily  Continue statin at discharge   Other Stroke Risk Factors  Advanced age  Cigarette smoker -  advised to stop smoking  Obesity, Body mass index is 32 kg/m., recommend weight loss, diet and exercise as appropriate     Other Active Problems  15 mm right upper lobe pulmonary nodule. (See recommendations above)   (HEAVY SMOKER) - CT chest with irregular nodule, not clearly cancer, recommend follow up in 2 months    Patient may be hypercoagulable secondary to malignancy.   PLAN  TEE and possible loop today and DC home after that. F/U as outpatient stroke clinic in 4-6 weeks)  Hospital day # 2 I have personally examined this patient, reviewed notes, independently viewed imaging studies, participated in medical decision making and plan of care.ROS completed by me personally and pertinent positives fully documented  I have made any additions or clarifications directly to the above note. 50% time during this 25 minute visit was spent on counseling and coordination of care and d/w daughter Antony Contras, MD Medical Director [place] Pager: [phone] 07/23/2016 12:38 PM   To contact Stroke Continuity provider, please refer to [email or web address] After hours, contact General Neurology

## 2016-07-23 NOTE — Interval H&P Note (Signed)
History and Physical Interval Note:  07/23/2016 2:46 PM  Kathleen Guerrero  has presented today for surgery, with the diagnosis of Stroke  The various methods of treatment have been discussed with the patient and family. After consideration of risks, benefits and other options for treatment, the patient has consented to  Procedure(s): TRANSESOPHAGEAL ECHOCARDIOGRAM (TEE) (N/A) as a surgical intervention .  The patient's history has been reviewed, patient examined, no change in status, stable for surgery.  I have reviewed the patient's chart and labs.  Questions were answered to the patient's satisfaction.     Kirk Ruths

## 2016-07-23 NOTE — H&P (View-Only) (Signed)
STROKE TEAM PROGRESS NOTE   SUBJECTIVE (INTERVAL HISTORY) Patient sitting up in bedsidechair  . I discussed Ct chest results with her and her daughter and need for outpatient f/u of pulmonary nodule.Mild left leg weakness persists. TEE and loop later today   OBJECTIVE Temp:  [98.6 F (37 C)-98.9 F (37.2 C)] 98.8 F (37.1 C) (09/19 1006) Pulse Rate:  [74-92] 75 (09/19 1006) Cardiac Rhythm: Normal sinus rhythm (09/18 1900) Resp:  [16-18] 18 (09/19 1006) BP: (130-174)/(68-87) 166/87 (09/19 1006) SpO2:  [98 %-99 %] 98 % (09/19 1006) Weight:  [192 lb 4.8 oz (87.2 kg)] 192 lb 4.8 oz (87.2 kg) (09/18 2003)  CBC:   Recent Labs Lab 07/20/16 1042 07/22/16 0219  WBC 7.0 5.9  NEUTROABS 3.9  --   HGB 14.3 12.9  HCT 43.4 39.9  MCV 92.5 93.0  PLT 264 053    Basic Metabolic Panel:   Recent Labs Lab 07/20/16 1042 07/22/16 0219  NA 139 140  K 3.2* 3.5  CL 104 106  CO2 26 26  GLUCOSE 124* 108*  BUN 8 8  CREATININE 0.82 0.72  CALCIUM 10.1 9.5    Lipid Panel:     Component Value Date/Time   CHOL 159 07/21/2016 0504   TRIG 128 07/21/2016 0504   HDL 38 (L) 07/21/2016 0504   CHOLHDL 4.2 07/21/2016 0504   VLDL 26 07/21/2016 0504   LDLCALC 95 07/21/2016 0504   HgbA1c:  Lab Results  Component Value Date   HGBA1C 5.7 (H) 07/21/2016   Urine Drug Screen: No results found for: LABOPIA, COCAINSCRNUR, LABBENZ, AMPHETMU, THCU, LABBARB    IMAGING  Ct Angio Head and Neck W Or Wo Contrast 07/21/2016 1. Carotid and vertebral arteries of the neck are patent. No occlusion, aneurysm, dissection, or significant stenosis is identified. Mild calcified atherosclerosis of the bifurcations bilaterally.  2. Patent anterior and posterior circulations of the circle of Willis. No large vessel occlusion, aneurysm, or significant stenosis is identified. Mild intracranial atherosclerosis.  3. An incidental finding of potential clinical significance has been found. 15 mm right upper lobe pulmonary  nodule.  Consider one of the following in 3 months for both low-risk and high-risk individuals:  (a) repeat chest CT,  (b) follow-up PET-CT, or  (c) tissue sampling.     Ct Head Wo Contrast 07/20/2016 No evidence of acute intracranial abnormality. Atrophy and probable mild to moderate chronic small-vessel white matter ischemic changes.     Mr Brain Wo Contrast 07/20/2016 1. Acute lacunar type infarct of the right corona radiata near the motor strip. No associated hemorrhage or mass effect.  2. Moderately advanced for age nonspecific signal changes most pronounced in the white matter. Favor chronic small vessel disease related.     CT Chest with and without contrast -  . Somewhat irregularly marginated nodule of 13 x 9 x 8 mm within the periphery of the right upper lobe. No additional lung nodule is seen. Consider follow-up CT in the next 2 months to assess stability or resolution. If this nodule persists, then PET-CT may be helpful to assess for metabolic activity.    PHYSICAL EXAM  Anxious middle aged caucasian lady not in distress. . Afebrile. Head is nontraumatic. Neck is supple without bruit.    Cardiac exam no murmur or gallop. Lungs are clear to auscultation. Distal pulses are well felt.  Neurological Exam :  Awake alert oriented x 3 normal speech and language. Visual acuity seems normal. Pupils are equal and reactive. Fundi were  not visualized. Visual fields are full to bedside confrontational testing. Mild left lower face asymmetry. Tongue midline. No drift. Mild diminished fine finger movements on left. Orbits right over left upper extremity. Mild left hip flexor and ankle dorsiflexor weakness.. Normal sensation . Normal coordination.      ASSESSMENT/PLAN Ms. NERISSA CONSTANTIN is a 70 y.o. female with history of tobacco use and hypertension presenting with left leg weakness.. She did not receive IV t-PA due to late presentation.  Stroke:  Non-dominant infarct possibly  embolic from an unknown source vs small vessel disease.  Resultant very mild left hemiparesis  MRI - Acute lacunar type infarct of the right corona radiata near the motor strip.  MRA - not performed  CTA H&N - no significant stenosis; however, incidental finding 15 mm right upper lobe pulmonary nodule.  Carotid Doppler - CTA performed 2D Echo -Left ventricle: The cavity size was normal. Systolic function was   normal. The estimated ejection fraction was in the range of 60%   to 65%. Wall motion was normal; there were no regional wall    motion abnormalitiesTEE to look for embolic source. Arranged with [place] for tomorrow - Tuesday.  If positive for PFO (patent foramen ovale), check bilateral lower extremity venous dopplers to rule out DVT as possible source of stroke. (I have made patient NPO after midnight tonight).  If TEE negative, a [place] electrophysiologist will consult and consider placement of an implantable loop recorder to evaluate for atrial fibrillation as etiology of stroke. This has been explained to patient/family by Dr. Leonie Man and they are agreeable.    LDL - 95  HgbA1c - 5.7  VTE prophylaxis - Lovenox Diet NPO time specified  No antithrombotic prior to admission, now on aspirin 325 mg daily  Patient counseled to be compliant with her antithrombotic medications  Ongoing aggressive stroke risk factor management  Therapy recommendations: Outpatient physical therapy recommended. No OT follow-up needed.  Disposition: home Hypertension  Blood pressure tends to run high  Permissive hypertension (OK if < 220/120) but gradually normalize in 5-7 days  Long-term BP goal normotensive  Hyperlipidemia  Home meds: No lipid lowering medications prior to admission  LDL 95, goal < 70  Now on Lipitor 40 mg daily  Continue statin at discharge   Other Stroke Risk Factors  Advanced age  Cigarette smoker -  advised to stop smoking  Obesity, Body mass index is 32 kg/m., recommend weight loss, diet and exercise as appropriate     Other Active Problems  15 mm right upper lobe pulmonary nodule. (See recommendations above)   (HEAVY SMOKER) - CT chest with irregular nodule, not clearly cancer, recommend follow up in 2 months    Patient may be hypercoagulable secondary to malignancy.   PLAN  TEE and possible loop today and DC home after that. F/U as outpatient stroke clinic in 4-6 weeks)  Hospital day # 2 I have personally examined this patient, reviewed notes, independently viewed imaging studies, participated in medical decision making and plan of care.ROS completed by me personally and pertinent positives fully documented  I have made any additions or clarifications directly to the above note. 50% time during this 25 minute visit was spent on counseling and coordination of care and d/w daughter Antony Contras, MD Medical Director [place] Pager: [phone] 07/23/2016 12:38 PM   To contact Stroke Continuity provider, please refer to [email or web address] After hours, contact General Neurology

## 2016-07-23 NOTE — Care Management Note (Signed)
Case Management Note  Patient Details  Name: TALON WITTING MRN: [id number] Date of Birth: [birth year]  Subjective/Objective:                    Action/Plan: Pt asked to see CM for assistance with finding a PCP. Pt was interested in [facility] but they do not have any appointments available for the MD she would like to see until January. CM asked if she would be interested in being set up with [facility]. Patient was in agreement. CM was able to make her an appointment with Dr Jonathon Jordan for Oct. Appointment placed on the AVS and pt informed. Pt also interested in being set up with the [facility]. CM spoke with Dr Darrick Meigs and he was in agreement. Orders placed in EPIC and information on the AVS.   Expected Discharge Date:                  Expected Discharge Plan:  Home/Self Care  In-House Referral:     Discharge planning Services     Post Acute Care Choice:    Choice offered to:     DME Arranged:    DME Agency:     HH Arranged:    [place] Agency:     Status of Service:  Completed, signed off  If discussed at [employer] of Stay Meetings, dates discussed:    Additional Comments:  Pollie Friar, RN 07/23/2016, 12:45 PM

## 2016-07-24 ENCOUNTER — Encounter (HOSPITAL_COMMUNITY): Payer: Self-pay | Admitting: Internal Medicine

## 2016-07-25 ENCOUNTER — Telehealth: Payer: Self-pay | Admitting: General Practice

## 2016-07-25 NOTE — Telephone Encounter (Signed)
Patient was just released from the hospital.  Daughter is looking to establish patient with primary care as soon as possible.  Would you be willing to take patient on and allow [place] to work her in within the next week.

## 2016-07-25 NOTE — Telephone Encounter (Signed)
That is fine 

## 2016-07-26 ENCOUNTER — Encounter: Payer: Self-pay | Admitting: Family

## 2016-07-26 ENCOUNTER — Ambulatory Visit (INDEPENDENT_AMBULATORY_CARE_PROVIDER_SITE_OTHER): Payer: Medicare HMO | Admitting: Family

## 2016-07-26 VITALS — BP 122/88 | HR 81 | Temp 98.7°F | Resp 16 | Ht 65.0 in | Wt 182.0 lb

## 2016-07-26 DIAGNOSIS — R911 Solitary pulmonary nodule: Secondary | ICD-10-CM | POA: Diagnosis not present

## 2016-07-26 DIAGNOSIS — Z720 Tobacco use: Secondary | ICD-10-CM

## 2016-07-26 DIAGNOSIS — I6381 Other cerebral infarction due to occlusion or stenosis of small artery: Secondary | ICD-10-CM

## 2016-07-26 DIAGNOSIS — Z23 Encounter for immunization: Secondary | ICD-10-CM | POA: Diagnosis not present

## 2016-07-26 DIAGNOSIS — N3281 Overactive bladder: Secondary | ICD-10-CM | POA: Insufficient documentation

## 2016-07-26 DIAGNOSIS — I639 Cerebral infarction, unspecified: Secondary | ICD-10-CM

## 2016-07-26 DIAGNOSIS — G479 Sleep disorder, unspecified: Secondary | ICD-10-CM | POA: Insufficient documentation

## 2016-07-26 DIAGNOSIS — I10 Essential (primary) hypertension: Secondary | ICD-10-CM

## 2016-07-26 MED ORDER — OXYBUTYNIN CHLORIDE ER 5 MG PO TB24: 5.0000 mg | ORAL_TABLET | Freq: Every day | ORAL | 0 refills | Status: DC

## 2016-07-26 NOTE — Assessment & Plan Note (Signed)
Previously maintained on Benadryl nightly advised to discontinue medication upon discharge from the hospital. Discussed importance of sleep hygiene and maintaining sleep as well as use of melatonin as needed. Continue to monitor.

## 2016-07-26 NOTE — Progress Notes (Signed)
Subjective:    Patient ID: Kathleen Guerrero, female    DOB: 10/31/1946, 70 y.o.   MRN: [id number]  Chief Complaint  Patient presents with  . Establish Care    had a stroke last saturday, was taking benedryl for sleep, would like something to help sleep, referral for pulmonology had nodule on lung, needs CT scan in 2 months, something to quit something, referral to urology?    HPI:  Kathleen Guerrero is a 70 y.o. female who  has a past medical history of Hypertension; Stroke ([place]); and Tobacco use. and presents today for an office visit to establish care.   1.) CVA - Previously evaluated in the emergency department and admitted to the hospital with chief complaint of left lower extremity weakness. MRI of the brain showed an acute lacunar type infarct in the right corona [place] near motor strip. She underwent a TEE which was negative for significant abnormalities as well as having a loop recorder placed. CTA of the head and neck were unremarkable. 2-D echocardiogram showed ejection fracture 65% and grade 2 diastolic dysfunction. Hypertension remained stable on the hospital. She was noted to have a lung nodule measuring 13 x 9 x 8 mm within the periphery of the right upper lobe with recommended follow-up to assess stability or resolution. Hospitalist recommended follow-up with PCP in one month and was started on aspirin and Lipitor. Outpatient physical therapy was established. All hospital records, imaging and labs were reviewed in detail.   Since leaving the hospital she reports she is feeling better every day. She has had improvements in her left lower extremity weakness. Notes she has been walking more and her symptoms have also improved. Ambulating with a walker. She is able to complete her activities of daily living. Her daughter is staying with her and helping her as needed while working to increase the safety of the home. Daughter notes that she is a fall risk.   2.) Hypertension - Patient  indicates that this is a new diagnosis since leaving the hospital and has not had hypertension in the past. Not currently maintained on medication. Denies worst headache of life. No new symptoms of end organ damage since her recent stroke. Continues to work on Medical illustrator.   BP Readings from Last 3 Encounters:  07/26/16 122/88  07/23/16 (!) 146/75   3.) Sleep disturbance - Previously taking Benedryl to help her sleep on a nightly basis. This medication was discharged prior to leaving the hospital. Notes that she is averaging about 4 hours of sleep per night. Describes that she gets up to use the bathroom multiple times per night and has difficulty falling back asleep. She does get up every morning about the same time.     4.) Urinary incontinence - This is a new problem. Associated symptom of urinary incontinence and freqeuncy has been going on for several months. Not associated with coughing, sneezing or laughing. No fevers. No dysuria or vaginal discharge. She does get up to use the bathroom multiple times throughout the night. Severity results in her having to use a Depends to prevent leakage.    No Known Allergies    Outpatient Medications Prior to Visit  Medication Sig Dispense Refill  . aspirin 325 MG tablet Take 1 tablet (325 mg total) by mouth daily. 30 tablet 2  . atorvastatin (LIPITOR) 40 MG tablet Take 1 tablet (40 mg total) by mouth daily at 6 PM. 30 tablet 2  . UNABLE TO FIND Outpatient Physical  therapy  3 x week 1 each 0   No facility-administered medications prior to visit.       Past Surgical History:  Procedure Laterality Date  . DERMOID CYST  EXCISION    . EP IMPLANTABLE DEVICE N/A 07/23/2016   Procedure: Loop Recorder Insertion;  Surgeon: Thompson Grayer, MD;  Location: [place] [facility];  Service: Cardiovascular;  Laterality: N/A;  . Intestinal obstruction    . TEE WITHOUT CARDIOVERSION N/A 07/23/2016   Procedure: TRANSESOPHAGEAL ECHOCARDIOGRAM (TEE);   Surgeon: Lelon Perla, MD;  Location: [facility] ENDOSCOPY;  Service: Cardiovascular;  Laterality: N/A;      Past Medical History:  Diagnosis Date  . Hypertension   . Stroke ([place])   . Tobacco use       Review of Systems  Constitutional: Negative for chills and fever.  Eyes:       Negative for changes in vision  Respiratory: Negative for cough, chest tightness and wheezing.   Cardiovascular: Negative for chest pain, palpitations and leg swelling.  Genitourinary: Positive for frequency and urgency. Negative for dysuria, hematuria, pelvic pain and vaginal discharge.  Neurological: Negative for dizziness, weakness and light-headedness.      Objective:    BP 122/88 (BP Location: Left Arm, Patient Position: Sitting, Cuff Size: Large)   Pulse 81   Temp 98.7 F (37.1 C) (Oral)   Resp 16   Ht '5\' 5"'$  (1.651 m)   Wt 182 lb (82.6 kg)   SpO2 95%   BMI 30.29 kg/m  Nursing note and vital signs reviewed.  Physical Exam  Constitutional: She is oriented to person, place, and time. She appears well-developed and well-nourished. No distress.  Cardiovascular: Normal rate, regular rhythm, normal heart sounds and intact distal pulses.   Pulmonary/Chest: Effort normal and breath sounds normal.  Neurological: She is alert and oriented to person, place, and time. No cranial nerve deficit or sensory deficit. She displays a negative Romberg sign. GCS eye subscore is 4. GCS verbal subscore is 5. GCS motor subscore is 6.  There is decreased strength of hip flexors and knee extensors.   Skin: Skin is warm and dry.  Psychiatric: She has a normal mood and affect. Her behavior is normal. Judgment and thought content normal.       Assessment & Plan:   Problem List Items Addressed This Visit      Cardiovascular and Mediastinum   HTN (hypertension)    Blood pressure below goal 140/90 with no current medication and management with lifestyle. Continue to monitor blood pressure at home and follow-up if  average blood pressure begins to rise. Advised to follow sodium diet. No new symptoms of end organ damage appreciated upon exam. Denies worse headache of life. Continue to monitor.        Nervous and Auditory   Acute lacunar stroke ([place]) - Primary    Symptoms of recent stroke appear stable and improving. Blood pressure remains controlled. Goal is to maximize rehabilitation and reduce risk for future stroke. Physical therapy is scheduled to being shortly and her daughter is working to help improve the safety of the home. She ambulates with her walker and is able to complete activities of daily living with little assistance. Blood pressure appears well controlled without medication and will continue to monitor. She is on atorvastatin to reduce cholesterol with no adverse side effects. She has also quit smoking.         Genitourinary   Overactive bladder    Symptoms of incontinence  are consistent with urinary frequency and urgency most likely related to overactive bladder as opposed to incontinence. No evidence of infection based on current symptoms. Start oxybutynin. Patient advised this medication may cause her to be dizzy or drowsy. If symptoms do not improve with medication consider referral to urology for further assessment and treatment.        Other   Tobacco abuse (Chronic)    Has quit smoking since her previous stroke approximately one week ago. Encouraged to continue with stress management and alternative coping mechanisms. She is interested in using nicotine loss and just which are available over-the-counter. Resources provided. Continue to monitor.      Nodule of right lung    Noted to have a nodule located in her right lung measuring 13 x 9 x 8 mm. Given 50-pack-year history this is concerning for the possibility of malignancy. She has quit smoking presently. Refer to pulmonology for further assessment and evaluation and additional follow-up CT scan and treatment is necessary.       Relevant Orders   Ambulatory referral to Pulmonology   Sleep disturbance    Previously maintained on Benadryl nightly advised to discontinue medication upon discharge from the hospital. Discussed importance of sleep hygiene and maintaining sleep as well as use of melatonin as needed. Continue to monitor.       Other Visit Diagnoses    Encounter for immunization       Relevant Medications   oxybutynin (DITROPAN-XL) 5 MG 24 hr tablet   Other Relevant Orders   Ambulatory referral to Pulmonology   Flu vaccine HIGH DOSE PF (Completed)   Need for vaccination with 13-polyvalent pneumococcal conjugate vaccine       Relevant Orders   Pneumococcal conjugate vaccine 13-valent IM (Completed)       I have discontinued Kathleen Guerrero's UNABLE TO FIND. I am also having her start on oxybutynin. Additionally, I am having her maintain her aspirin and atorvastatin.   Meds ordered this encounter  Medications  . oxybutynin (DITROPAN-XL) 5 MG 24 hr tablet    Sig: Take 1 tablet (5 mg total) by mouth at bedtime.    Dispense:  30 tablet    Refill:  0    Order Specific Question:   Supervising Provider    Answer:   Pricilla Holm A [[id number]]     Follow-up: Return in about 1 month (around 08/25/2016).  Mauricio Po, FNP

## 2016-07-26 NOTE — Assessment & Plan Note (Signed)
Symptoms of recent stroke appear stable and improving. Blood pressure remains controlled. Goal is to maximize rehabilitation and reduce risk for future stroke. Physical therapy is scheduled to being shortly and her daughter is working to help improve the safety of the home. She ambulates with her walker and is able to complete activities of daily living with little assistance. Blood pressure appears well controlled without medication and will continue to monitor. She is on atorvastatin to reduce cholesterol with no adverse side effects. She has also quit smoking.

## 2016-07-26 NOTE — Assessment & Plan Note (Signed)
Symptoms of incontinence are consistent with urinary frequency and urgency most likely related to overactive bladder as opposed to incontinence. No evidence of infection based on current symptoms. Start oxybutynin. Patient advised this medication may cause her to be dizzy or drowsy. If symptoms do not improve with medication consider referral to urology for further assessment and treatment.

## 2016-07-26 NOTE — Assessment & Plan Note (Signed)
Noted to have a nodule located in her right lung measuring 13 x 9 x 8 mm. Given 50-pack-year history this is concerning for the possibility of malignancy. She has quit smoking presently. Refer to pulmonology for further assessment and evaluation and additional follow-up CT scan and treatment is necessary.

## 2016-07-26 NOTE — Assessment & Plan Note (Signed)
Blood pressure below goal 140/90 with no current medication and management with lifestyle. Continue to monitor blood pressure at home and follow-up if average blood pressure begins to rise. Advised to follow sodium diet. No new symptoms of end organ damage appreciated upon exam. Denies worse headache of life. Continue to monitor.

## 2016-07-26 NOTE — Telephone Encounter (Signed)
Got patient scheduled

## 2016-07-26 NOTE — Assessment & Plan Note (Signed)
Has quit smoking since her previous stroke approximately one week ago. Encouraged to continue with stress management and alternative coping mechanisms. She is interested in using nicotine loss and just which are available over-the-counter. Resources provided. Continue to monitor.

## 2016-07-26 NOTE — Patient Instructions (Addendum)
Thank you for choosing [facility].  SUMMARY AND INSTRUCTIONS:  Melatonin as needed for sleep.  Continue to work with physical therapy.   Medication:  Please continue to take the atorvastatin.  Your prescription(s) have been submitted to your pharmacy or been printed and provided for you. Please take as directed and contact our office if you believe you are having problem(s) with the medication(s) or have any questions.  Follow up:  If your symptoms worsen or fail to improve, please contact our office for further instruction, or in case of emergency go directly to the emergency room at the closest medical facility.    Recommendations for improving sleep:   Avoid having pets sleep in the bedroom  Avoid caffeine consumption after 4pm  Keep bedroom cool and conducive to sleep  Avoid nicotine use, especially in the evening  Avoid exercise within 2-3 hours before bedtime  Stimulus Control:   Go to bed only when sleepy  Use the bedroom for sleep and sex only  Go to another room if you are unable to fall asleep within 15 to 20 minutes  Read or engage in other quiet activities and return to bed only when sleepy.

## 2016-08-01 ENCOUNTER — Other Ambulatory Visit: Payer: Self-pay | Admitting: *Deleted

## 2016-08-01 ENCOUNTER — Encounter: Payer: Self-pay | Admitting: Internal Medicine

## 2016-08-01 ENCOUNTER — Ambulatory Visit: Payer: Medicare HMO | Attending: Family Medicine | Admitting: Physical Therapy

## 2016-08-01 ENCOUNTER — Encounter: Payer: Self-pay | Admitting: Physical Therapy

## 2016-08-01 ENCOUNTER — Ambulatory Visit (INDEPENDENT_AMBULATORY_CARE_PROVIDER_SITE_OTHER): Payer: Medicare HMO | Admitting: *Deleted

## 2016-08-01 DIAGNOSIS — M6281 Muscle weakness (generalized): Secondary | ICD-10-CM

## 2016-08-01 DIAGNOSIS — R2689 Other abnormalities of gait and mobility: Secondary | ICD-10-CM | POA: Diagnosis not present

## 2016-08-01 DIAGNOSIS — I639 Cerebral infarction, unspecified: Secondary | ICD-10-CM

## 2016-08-01 DIAGNOSIS — R2681 Unsteadiness on feet: Secondary | ICD-10-CM

## 2016-08-01 NOTE — Patient Outreach (Signed)
[facility]) Care Management  08/01/2016  Kathleen Guerrero May 14, 1946 [id number]  EMMI_Stroke referral with dashboard red on questions/problems with meds?   Telephone call to patient who was advised of reason for call. HIPPA verification received from patient. Patient voices that she does not have questions or problems with medications since her recent discharge from hospital.  States she went to hospital with problem of left leg weakness & was diagnosed with Stroke.  Voices that weakness in leg is improving & that she started outpatient physical therapy today. Currently using cane to assist with walking. Falls prevention discussed with patient & she voices understanding.   Voices that she is taking medications as prescribed & understands importance of medication compliance. No problems getting prescriptions filled.   States she had hospital follow up appointment with primary care provider 07/26/16. Family member provided transportation.   Very excited to say that she has quit smoking.   RN case manager reviewed signs & symptoms of stroke & action plan. Patient voices understanding & states she would not hestitate to call 911 if symptoms occur.  States she has lots of educational materials to review and not need any more materials.   EMMI-Stroke call completed & dashboard addressed.  Plan: Will close case; send to care management assistant.  Sherrin Daisy, RN BSN [place] Management Coordinator [facility] Care Management  [phone]

## 2016-08-01 NOTE — Progress Notes (Signed)
Wound Loop check in clinic. Wound well healed, edges approximated. No redness or edema. Battery status: good. R-waves 0.36m. 0  symptom episodes, 0 tachy episodes.  BLoletha Grayerand Pause detection off. 0 AF episodes (0% burden). Pt educated about wound care and remote monitoring. Monthly summary reports and ROV with JA PRN

## 2016-08-01 NOTE — Therapy (Signed)
[facility] [address] [place], [place], [address] Phone: [phone]   Fax:  [phone]  Physical Therapy Evaluation  Patient Details  Name: Kathleen Guerrero MRN: [id number] Date of Birth: 08/17/1946 Referring Provider: Antony Contras, MD  Encounter Date: 08/01/2016      PT End of Session - 08/01/16 1621    Visit Number 1   Number of Visits 17   Date for PT Re-Evaluation 09/30/16   Authorization Type Aetna Medicare   Authorization Time Period G-Code every 10th visit   PT Start Time 0800   PT Stop Time 0846   PT Time Calculation (min) 46 min   Activity Tolerance Patient tolerated treatment well   Behavior During Therapy [facility] for tasks assessed/performed      Past Medical History:  Diagnosis Date  . Hypertension   . Stroke ([place])   . Tobacco use     Past Surgical History:  Procedure Laterality Date  . DERMOID CYST  EXCISION    . EP IMPLANTABLE DEVICE N/A 07/23/2016   Procedure: Loop Recorder Insertion;  Surgeon:  Grayer, MD;  Location: [place] [facility];  Service: Cardiovascular;  Laterality: N/A;  . Intestinal obstruction    . TEE WITHOUT CARDIOVERSION N/A 07/23/2016   Procedure: TRANSESOPHAGEAL ECHOCARDIOGRAM (TEE);  Surgeon: Lelon Perla, MD;  Location: [facility] ENDOSCOPY;  Service: Cardiovascular;  Laterality: N/A;    There were no vitals filed for this visit.       Subjective Assessment - 08/01/16 0806    Subjective Pt reports she "had a stroke that only effected my L leg".  She states she intially used a RW but has now progressed to a cane.   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   Patient is accompained by: Family member  Daughter   Limitations Standing;Walking;House hold activities   How long can you stand comfortably? 10-15 min   How long can you walk comfortably? >1000 ft   Patient Stated Goals "I want to improve my balance and be stronger.  Want my L  leg not to fell weak."   Currently in Pain? No/denies   Multiple Pain Sites No            [facility] PT Assessment - 08/01/16 0800      Assessment   Medical Diagnosis Cerberal infarction due to unspecified mechanism   Referring Provider Antony Contras, MD   Onset Date/Surgical Date 07/20/16   Hand Dominance Right     Balance Screen   Has the patient fallen in the past 6 months Yes   How many times? 1   Has the patient had a decrease in activity level because of a fear of falling?  Yes   Is the patient reluctant to leave their home because of a fear of falling?  No     Home Environment   Living Environment Private residence   Living Arrangements Alone   Available Help at Discharge Friend(s);Family   Type of Home Other(Comment)   Home Access Stairs to enter  [employer] of Steps 5   Entrance Stairs-Rails Left;Right;Can reach both   Home Layout One level   [place] - 2 wheels;Cane - quad;Grab bars - tub/shower;Shower seat     Prior Function   Level of Independence Independent   Vocation Part time employment     Coordination   Gross Motor Movements are Fluid and Coordinated Yes  Fine Motor Movements are Fluid and Coordinated Not tested   Finger Nose Finger Test Intact bilaterally   Heel Shin Test Slight deficit on L     ROM / Strength   AROM / PROM / Strength AROM;Strength     AROM   Overall AROM  Within functional limits for tasks performed     Strength   Overall Strength Deficits   Strength Assessment Site Shoulder;Hip;Knee;Ankle   Right/Left Shoulder Right;Left   Right Shoulder Flexion 4/5   Left Shoulder Flexion 3+/5   Right/Left Hip Right;Left   Right Hip Flexion 3+/5   Right Hip ABduction 3+/5   Left Hip Flexion 3-/5   Left Hip ABduction 3-/5   Right/Left Knee Right;Left   Right Knee Flexion 4-/5   Right Knee Extension 4/5   Left Knee Flexion 3/5   Left Knee Extension 3-/5   Right/Left Ankle Right;Left   Right Ankle  Dorsiflexion 4/5   Left Ankle Dorsiflexion 3+/5     Transfers   Transfers Sit to Stand;Stand to Sit;Stand Pivot Transfers   Sit to Stand 6: Modified independent (Device/Increase time)   Stand to Sit 6: Modified independent (Device/Increase time)   Stand Pivot Transfers 5: Supervision     Ambulation/Gait   Ambulation/Gait Yes   Ambulation/Gait Assistance 5: Supervision   Ambulation Distance (Feet) 30 Feet  8x30   Assistive device Straight cane;None   Gait Pattern Step-through pattern;Decreased arm swing - right;Decreased arm swing - left;Decreased step length - right;Decreased stance time - left;Decreased hip/knee flexion - left;Trunk flexed;Narrow base of support;Poor foot clearance - left   Ambulation Surface Level;Indoor   Gait velocity 3.13f/sec  Indicates community ambulator   Stairs Yes   Stairs Assistance 6: Modified independent (Device/Increase time)   Stair Management Technique Alternating pattern;One rail Right   Number of Stairs 4   Height of Stairs 6     Standardized Balance Assessment   Standardized Balance Assessment Berg Balance Test     Berg Balance Test   Sit to Stand Able to stand without using hands and stabilize independently   Standing Unsupported Able to stand safely 2 minutes   Sitting with Back Unsupported but Feet Supported on Floor or Stool Able to sit safely and securely 2 minutes   Stand to Sit Sits safely with minimal use of hands   Transfers Able to transfer safely, minor use of hands   Standing Unsupported with Eyes Closed Able to stand 10 seconds with supervision   Standing Ubsupported with Feet Together Able to place feet together independently and stand for 1 minute with supervision   From Standing, Reach Forward with Outstretched Arm Can reach confidently >25 cm (10")   From Standing Position, Pick up Object from Floor Able to pick up shoe safely and easily   From Standing Position, Turn to Look Behind Over each Shoulder Looks behind from both  sides and weight shifts well   Turn 360 Degrees Able to turn 360 degrees safely in 4 seconds or less   Standing Unsupported, Alternately Place Feet on Step/Stool Able to stand independently and complete 8 steps >20 seconds   Standing Unsupported, One Foot in Front Able to plae foot ahead of the other independently and hold 30 seconds   Standing on One Leg Tries to lift leg/unable to hold 3 seconds but remains standing independently   Total Score 49   Berg comment: Indicates moderate fall risk     Functional Gait  Assessment   Gait assessed  Yes  Gait Level Surface Walks 20 ft in less than 7 sec but greater than 5.5 sec, uses assistive device, slower speed, mild gait deviations, or deviates 6-10 in outside of the 12 in walkway width.   Change in Gait Speed Able to smoothly change walking speed without loss of balance or gait deviation. Deviate no more than 6 in outside of the 12 in walkway width.   Gait with Horizontal Head Turns Performs head turns smoothly with slight change in gait velocity (eg, minor disruption to smooth gait path), deviates 6-10 in outside 12 in walkway width, or uses an assistive device.   Gait with Vertical Head Turns Performs head turns with no change in gait. Deviates no more than 6 in outside 12 in walkway width.   Gait and Pivot Turn Pivot turns safely within 3 sec and stops quickly with no loss of balance.   Step Over Obstacle Is able to step over 2 stacked shoe boxes taped together (9 in total height) without changing gait speed. No evidence of imbalance.   Gait with Narrow Base of Support Ambulates less than 4 steps heel to toe or cannot perform without assistance.   Gait with Eyes Closed Cannot walk 20 ft without assistance, severe gait deviations or imbalance, deviates greater than 15 in outside 12 in walkway width or will not attempt task.   Ambulating Backwards Walks 20 ft, slow speed, abnormal gait pattern, evidence for imbalance, deviates 10-15 in outside 12 in  walkway width.   Steps Alternating feet, must use rail.   Total Score 19   FGA comment: Indicates medium fall risk                           PT Education - 08/01/16 1620    Education provided Yes   Education Details Pt educated on POC, diagnosis, and prognosis with PT.  Pt also instructed to continue using SPC when outdoors or in the community.   Person(s) Educated Patient;Child(ren)  Daughter   Methods Explanation   Comprehension Verbalized understanding          PT Short Term Goals - 08/01/16 1652      PT SHORT TERM GOAL #1   Title Pt will be independent and verbaliz understanding of initial HEP to progress her functional status. (Target Date for all STGs: 08/30/16)   Time 4   Period Weeks   Status New     PT SHORT TERM GOAL #2   Title Pt will improve Berg Balance score to >52/56 to decr fall risk.   Time 4   Period Weeks   Status New     PT SHORT TERM GOAL #3   Title Pt will improve FGA score to >or = 23/30 to indicate decr fall risk.   Time 4   Period Weeks   Status New     PT SHORT TERM GOAL #4   Title Pt will negotiate 8 stairs with mod I and no UE support to incr safety when entering/exiting her home.   Time 4   Period Weeks   Status New     PT SHORT TERM GOAL #5   Title Pt will ambulate 55f with LRAD and supervision indoors over level surfaces, ramps, and curbs to incr safety when ambulating at home and in the community.   Time 4   Period Weeks   Status New           PT Long Term Goals -  08/08/16 1659      PT LONG TERM GOAL #1   Title Pt will be independent and verbalize understanding of HEP and on-going fitness program to maintain progress made in therapy and improve overall health. (Target Date for all LTGs: 09/27/16)    Time 8   Period Weeks   Status New     PT LONG TERM GOAL #2   Title Pt will improve Berg Balance score to 56/56 to indicate decr risk of falls.   Time 8   Period Weeks   Status New     PT LONG TERM  GOAL #3   Title Pt will improve FGA score to > or = 27/30 to indicate decr risk of falls.   Time 8   Period Weeks   Status New     PT LONG TERM GOAL #4   Title Pt will negotiate 12 stairs with mod I while carrying a simulated load of laundry to incr safety while performing ADLs at home and while working.   Time 8   Period Weeks   Status New     PT LONG TERM GOAL #5   Title Pt will ambulate >1041f with mod I and LRAD outdoors over unlevel surfaces, ramps, curbs, grass, and gravel to incr safety while ambulating in the community.   Time 8   Period Weeks   Status New               Plan - [id number]    Clinical Impression Statement Pt is a 70year old female who presents to outpatient PT with a diagnosis of cerebral infarction with unspecified mechanism (07/20/16) with resulting L LE weakness and hospitalization (07/20/16 - 07/23/16).  PMH is significant for HTN, nodule of the R lung, sleep disturbance, and overactive bladder.  Pt's static balance seems to be intact as evidence by a Berg Balance score of 49/56, however, she scored a 19/30 on the FGA indicating she is a medium risk for falling.  Pt particularly had difficulty ambulating with EC, tandem gait, and backwards walking.  She also demonstrates decr strength in the L LE which was a main complaint due to the fact that it limits her from being able to perform ADL safely and negatively effects her functional mobility and balance.  Pt's activity tolerance is also limited as evidence by mild SOB and L foot drag at the end of testing.  Her condition appears to be evolving and of moderate complexity.  She would benefit from skilled PT to address her deficits.   Rehab Potential Good   Clinical Impairments Affecting Rehab Potential HTN   PT Frequency 2x / week   PT Duration 8 weeks   PT Treatment/Interventions ADLs/Self Care Home Management;Functional mobility training;Stair training;Gait training;DME Instruction;Therapeutic  activities;Therapeutic exercise;Balance training;Neuromuscular re-education;Patient/family education;Manual techniques;Energy conservation   PT Next Visit Plan Initiate HEP, L LE strengthening with functional balance and gait activities   Consulted and Agree with Plan of Care Patient;Family member/caregiver   Family Member Consulted Daughter      Patient will benefit from skilled therapeutic intervention in order to improve the following deficits and impairments:  Abnormal gait, Decreased activity tolerance, Decreased balance, Decreased mobility, Decreased knowledge of use of DME, Decreased endurance, Decreased strength, Impaired perceived functional ability, Improper body mechanics  Visit Diagnosis: Other abnormalities of gait and mobility  Unsteadiness on feet  Muscle weakness (generalized)      G-Codes - [id number]    Functional Assessment Tool Used FGA = 19/30  Functional Limitation Mobility: Walking and moving around   Mobility: Walking and Moving Around Current Status [phone]) At least 20 percent but less than 40 percent impaired, limited or restricted   Mobility: Walking and Moving Around Goal Status [phone]) At least 1 percent but less than 20 percent impaired, limited or restricted       Problem List Patient Active Problem List   Diagnosis Date Noted  . Sleep disturbance 07/26/2016  . Overactive bladder 07/26/2016  . Tobacco abuse 07/21/2016  . Nodule of right lung 07/21/2016  . Acute CVA (cerebrovascular accident) ([place]) 07/21/2016  . Acute lacunar stroke ([place]) 07/20/2016  . Hypokalemia 07/20/2016  . HTN (hypertension) 07/20/2016    Katherine Mantle, SPT 08/01/2016, 5:17 PM  [facility] [address] [place], [place], [address] Phone: [phone]   Fax:  [phone]  Name: Kathleen Guerrero MRN: [id number] Date of Birth: Jan 07, 1946

## 2016-08-09 ENCOUNTER — Encounter: Payer: Self-pay | Admitting: Physical Therapy

## 2016-08-09 ENCOUNTER — Ambulatory Visit: Payer: Medicare HMO | Attending: Family Medicine | Admitting: Physical Therapy

## 2016-08-09 DIAGNOSIS — R2681 Unsteadiness on feet: Secondary | ICD-10-CM | POA: Insufficient documentation

## 2016-08-09 DIAGNOSIS — M6281 Muscle weakness (generalized): Secondary | ICD-10-CM | POA: Insufficient documentation

## 2016-08-09 DIAGNOSIS — R2689 Other abnormalities of gait and mobility: Secondary | ICD-10-CM

## 2016-08-09 NOTE — Therapy (Signed)
[facility] [address] [place], [place], [address] Phone: [phone]   Fax:  [phone]  Physical Therapy Treatment  Patient Details  Name: Kathleen Guerrero MRN: [id number] Date of Birth: 02/01/1946 Referring Provider: Antony Contras, MD  Encounter Date: 08/09/2016      PT End of Session - 08/09/16 0855    Visit Number 2   Number of Visits 17   Date for PT Re-Evaluation 09/30/16   Authorization Type Aetna Medicare   Authorization Time Period G-Code every 10th visit   PT Start Time 0848   PT Stop Time 0930   PT Time Calculation (min) 42 min   Activity Tolerance Patient tolerated treatment well   Behavior During Therapy [facility] for tasks assessed/performed      Past Medical History:  Diagnosis Date  . Hypertension   . Stroke ([place])   . Tobacco use     Past Surgical History:  Procedure Laterality Date  . DERMOID CYST  EXCISION    . EP IMPLANTABLE DEVICE N/A 07/23/2016   Procedure: Loop Recorder Insertion;  Surgeon: Thompson Grayer, MD;  Location: [place] [facility];  Service: Cardiovascular;  Laterality: N/A;  . Intestinal obstruction    . TEE WITHOUT CARDIOVERSION N/A 07/23/2016   Procedure: TRANSESOPHAGEAL ECHOCARDIOGRAM (TEE);  Surgeon: Lelon Perla, MD;  Location: [facility] ENDOSCOPY;  Service: Cardiovascular;  Laterality: N/A;    There were no vitals filed for this visit.      Subjective Assessment - 08/09/16 0853    Subjective No new complaints. No falls or pain to report. Has been to silver sneakers twice since last visit (chair yoga once and low impact aerobic's once). Walking with no AD in home and using [facility] in community. Reports she does get tired, especially as day progresses.                  Limitations Standing;Walking;House hold activities   How long can you stand comfortably? 10-15 min   How long can you walk comfortably? >1000 ft   Currently in Pain? No/denies   Pain Score 0-No pain            [facility] Adult PT Treatment/Exercise - 08/09/16 0932      Ambulation/Gait   Ambulation/Gait Yes   Ambulation/Gait Assistance 5: Supervision   Ambulation/Gait Assistance Details gait for just over 6 minutes without AD with supervision, left toe scuffing toward end only,. Walking program issued    Ambulation Distance (Feet) --  >/= 800 feet   Assistive device None   Gait Pattern Step-through pattern;Decreased arm swing - left;Decreased stride length;Narrow base of support;Poor foot clearance - left   Ambulation Surface Level;Indoor     Issued the following to pt's HEP: Chair Sitting    Sit at edge of seat, spine straight, left leg extended. Put a hand on each thigh and bend forward from the hip, keeping spine straight. Allow hand on extended leg to reach toward toes. Support upper body with other arm. Hold _30 seconds. Repeat _3__ times per session. Do _1-2_ sessions per day.  Copyright  VHI. All rights reserved.  Functional Quadriceps: Sit to Stand    Sit on edge of chair, feet flat on floor, left foot back and right foot forward. Stand upright, extending knees fully (using left leg more than right). Repeat _10__ times per set. Do _1_ sets per session. Do _1-2_ sessions per day.  http://orth.exer.us/735   Copyright  VHI. All rights reserved.   "I  love a Engineer, mining top as needed for balance: High knee marching forward along counter and then backward along counter.  Hold each knee up in air for 3-5 seconds. Repeat 3 laps each way. Do _1-2_ sessions per day.  http://gt2.exer.us/345   Copyright  VHI. All rights reserved.    Feet Heel-Toe "Tandem"    Arms at side/holding counter as needed for balance: walk a straight line bringing one foot directly in front of the other along counter top and then walk a straight line backwards bringing one foot directly behind the other one.  Repeat for 3 laps each way session. Do _1-2_ sessions per day.  Copyright   VHI. All rights reserved.   Walking Program:  Begin walking for exercise for 6 minutes, 1-2 times/day, 5 days/week.   - Be sure to wear good walking shoes - Walk in a safe environment - Only progress to your tolerance, when the current time is easy, add 2  minutes at that time   - Use cane when walking outdoors or for longer times for now. If walking short times in home, you can walk without the AD             PT Education - 08/09/16 0931    Education provided Yes   Education Details HEP: for strengthening, stretching, balance and walking program    Person(s) Educated Patient   Methods Explanation;Demonstration;Verbal cues;Handout   Comprehension Verbalized understanding;Returned demonstration;Need further instruction          PT Short Term Goals - 08/01/16 1652      PT SHORT TERM GOAL #1   Title Pt will be independent and verbaliz understanding of initial HEP to progress her functional status. (Target Date for all STGs: 08/30/16)   Time 4   Period Weeks   Status New     PT SHORT TERM GOAL #2   Title Pt will improve Berg Balance score to >52/56 to decr fall risk.   Time 4   Period Weeks   Status New     PT SHORT TERM GOAL #3   Title Pt will improve FGA score to >or = 23/30 to indicate decr fall risk.   Time 4   Period Weeks   Status New     PT SHORT TERM GOAL #4   Title Pt will negotiate 8 stairs with mod I and no UE support to incr safety when entering/exiting her home.   Time 4   Period Weeks   Status New     PT SHORT TERM GOAL #5   Title Pt will ambulate 89f with LRAD and supervision indoors over level surfaces, ramps, and curbs to incr safety when ambulating at home and in the community.   Time 4   Period Weeks   Status New           PT Long Term Goals - 08/01/16 1659      PT LONG TERM GOAL #1   Title Pt will be independent and verbalize understanding of HEP and on-going fitness program to maintain progress made in therapy and improve overall  health. (Target Date for all LTGs: 09/27/16)    Time 8   Period Weeks   Status New     PT LONG TERM GOAL #2   Title Pt will improve Berg Balance score to 56/56 to indicate decr risk of falls.   Time 8   Period Weeks   Status New     PT  LONG TERM GOAL #3   Title Pt will improve FGA score to > or = 27/30 to indicate decr risk of falls.   Time 8   Period Weeks   Status New     PT LONG TERM GOAL #4   Title Pt will negotiate 12 stairs with mod I while carrying a simulated load of laundry to incr safety while performing ADLs at home and while working.   Time 8   Period Weeks   Status New     PT LONG TERM GOAL #5   Title Pt will ambulate >1067f with mod I and LRAD outdoors over unlevel surfaces, ramps, curbs, grass, and gravel to incr safety while ambulating in the community.   Time 8   Period Weeks   Status New           Plan - 08/09/16 0856    Clinical Impression Statement Today's skilled session focused on establishment of a home program for pt to perform in conjunction with PT and silver sneakers exercise classes. No issues noted with performance in session today. Pt is making great progress toward goals and should benefit from continued PT to progress toward unmet goals.    Rehab Potential Good   Clinical Impairments Affecting Rehab Potential HTN   PT Frequency 2x / week   PT Duration 8 weeks   PT Treatment/Interventions ADLs/Self Care Home Management;Functional mobility training;Stair training;Gait training;DME Instruction;Therapeutic activities;Therapeutic exercise;Balance training;Neuromuscular re-education;Patient/family education;Manual techniques;Energy conservation   PT Next Visit Plan continues with L LE strengthening with functional balance and gait activities   Consulted and Agree with Plan of Care Patient;Family member/caregiver   Family Member Consulted Daughter      Patient will benefit from skilled therapeutic intervention in order to improve the following  deficits and impairments:  Abnormal gait, Decreased activity tolerance, Decreased balance, Decreased mobility, Decreased knowledge of use of DME, Decreased endurance, Decreased strength, Impaired perceived functional ability, Improper body mechanics  Visit Diagnosis: Other abnormalities of gait and mobility  Unsteadiness on feet  Muscle weakness (generalized)     Problem List Patient Active Problem List   Diagnosis Date Noted  . Sleep disturbance 07/26/2016  . Overactive bladder 07/26/2016  . Tobacco abuse 07/21/2016  . Nodule of right lung 07/21/2016  . [place]  . Acute lacunar stroke ([place] 07/20/2016  . Hypokalemia 07/20/2016  . HTN (hypertension) 07/20/2016    KWillow Ora PTA, [id number] [place] [phone]/06/17, 9:38 AM   Name: Kathleen MCERLEANMRN: [id number] of Birth: 5June 12, 1947

## 2016-08-09 NOTE — Patient Instructions (Addendum)
Chair Sitting    Sit at edge of seat, spine straight, left leg extended. Put a hand on each thigh and bend forward from the hip, keeping spine straight. Allow hand on extended leg to reach toward toes. Support upper body with other arm. Hold _30 seconds. Repeat _3__ times per session. Do _1-2_ sessions per day.  Copyright  VHI. All rights reserved.  Functional Quadriceps: Sit to Stand    Sit on edge of chair, feet flat on floor, left foot back and right foot forward. Stand upright, extending knees fully (using left leg more than right). Repeat _10__ times per set. Do _1_ sets per session. Do _1-2_ sessions per day.  http://orth.exer.us/735   Copyright  VHI. All rights reserved.   "I love a Engineer, mining top as needed for balance: High knee marching forward along counter and then backward along counter.  Hold each knee up in air for 3-5 seconds. Repeat 3 laps each way. Do _1-2_ sessions per day.  http://gt2.exer.us/345   Copyright  VHI. All rights reserved.    Feet Heel-Toe "Tandem"    Arms at side/holding counter as needed for balance: walk a straight line bringing one foot directly in front of the other along counter top and then walk a straight line backwards bringing one foot directly behind the other one.  Repeat for 3 laps each way session. Do _1-2_ sessions per day.  Copyright  VHI. All rights reserved.   Walking Program:  Begin walking for exercise for 6 minutes, 1-2 times/day, 5 days/week.   - Be sure to wear good walking shoes - Walk in a safe environment - Only progress to your tolerance, when the current time is easy, add 2       minutes at that time   - Use cane when walking outdoors or for longer times for now. If walking   short times in home, you can walk without the AD

## 2016-08-13 ENCOUNTER — Encounter: Payer: Self-pay | Admitting: Physical Therapy

## 2016-08-13 ENCOUNTER — Ambulatory Visit: Payer: Medicare HMO | Admitting: Physical Therapy

## 2016-08-13 DIAGNOSIS — M6281 Muscle weakness (generalized): Secondary | ICD-10-CM

## 2016-08-13 DIAGNOSIS — R2689 Other abnormalities of gait and mobility: Secondary | ICD-10-CM

## 2016-08-13 DIAGNOSIS — R2681 Unsteadiness on feet: Secondary | ICD-10-CM

## 2016-08-13 NOTE — Therapy (Signed)
[facility] [address] [place], [place], [address] Phone: [phone]   Fax:  [phone]  Physical Therapy Treatment  Patient Details  Name: Kathleen Guerrero MRN: [id number] Date of Birth: May 01, 1946 Referring Provider: Antony Contras, MD  Encounter Date: 08/13/2016      PT End of Session - 08/13/16 1104    Visit Number 3   Number of Visits 17   Date for PT Re-Evaluation 09/30/16   Authorization Type [employer] Medicare   Authorization Time Period G-Code every 10th visit   PT Start Time 1102   PT Stop Time 1141   PT Time Calculation (min) 39 min   Activity Tolerance Patient tolerated treatment well   Behavior During Therapy [facility] for tasks assessed/performed      Past Medical History:  Diagnosis Date  . Hypertension   . Stroke ([place])   . Tobacco use     Past Surgical History:  Procedure Laterality Date  . DERMOID CYST  EXCISION    . EP IMPLANTABLE DEVICE N/A 07/23/2016   Procedure: Loop Recorder Insertion;  Surgeon: Thompson Grayer, MD;  Location: [place] [facility];  Service: Cardiovascular;  Laterality: N/A;  . Intestinal obstruction    . TEE WITHOUT CARDIOVERSION N/A 07/23/2016   Procedure: TRANSESOPHAGEAL ECHOCARDIOGRAM (TEE);  Surgeon: Lelon Perla, MD;  Location: [facility] ENDOSCOPY;  Service: Cardiovascular;  Laterality: N/A;    There were no vitals filed for this visit.      Subjective Assessment - 08/13/16 1103    Subjective Reports she is up to 12 minutes with her walking program. Denies any falls or pain today. Continuing to go to [employer] and working with daughter on yoga. Still getting tired in late afternoons, however not as bad/much as before.    Limitations Standing;Walking;House hold activities   How long can you stand comfortably? 10-15 min   How long can you walk comfortably? >1000 ft   Patient Stated Goals "I want to improve my balance and be stronger.  Want my L leg not to fell weak."   Currently in Pain? No/denies   Pain Score 0-No pain            [facility] Adult PT Treatment/Exercise - 08/13/16 1107      Transfers   Transfers Sit to Stand;Stand to Sit   Sit to Stand 6: Modified independent (Device/Increase time)   Stand to Sit 6: Modified independent (Device/Increase time)     Ambulation/Gait   Ambulation/Gait Yes   Ambulation/Gait Assistance 4: Min guard;4: Min assist   Ambulation/Gait Assistance Details min assist for 1 episode of left toe scuffing creating forward balance loss with pt assisting in recovery   Ambulation Distance (Feet) 450 Feet  x1, 500 x1 in/outdoors   Assistive device Straight cane   Gait Pattern Step-through pattern;Decreased arm swing - left;Decreased stride length;Narrow base of support;Poor foot clearance - left   Ambulation Surface Level;Indoor;Unlevel;Outdoor;Paved     High Level Balance   High Level Balance Activities Marching forwards;Marching backwards;Tandem walking  tandem fwd/bwd, toe walking fwd/bwd, heel walking fwd/bwd   High Level Balance Comments on red mats next to counter top: 3 laps each with intermittent UE support on counter for balance. min guard to min assist for balance with cues on weight shifting,  to increase base of support for improved balance and for posture.  Balance Exercises - 08/13/16 1136      Balance Exercises: Standing   SLS with Vectors Foam/compliant surface;Limitations     Balance Exercises: Standing   SLS with Vectors Limitations 6 cones along edge of red mats: alternating fwd toe taps to each cone with side stepping left<>right, alternating double toe taps to each cone with side stepping left<>right , 1 lap each/each way with min guard to min assist for balance.                              PT Short Term Goals - 08/01/16 1652      PT SHORT TERM GOAL #1   Title Pt will be independent and verbaliz understanding of initial HEP to progress her functional  status. (Target Date for all STGs: 08/30/16)   Time 4   Period Weeks   Status New     PT SHORT TERM GOAL #2   Title Pt will improve Berg Balance score to >52/56 to decr fall risk.   Time 4   Period Weeks   Status New     PT SHORT TERM GOAL #3   Title Pt will improve FGA score to >or = 23/30 to indicate decr fall risk.   Time 4   Period Weeks   Status New     PT SHORT TERM GOAL #4   Title Pt will negotiate 8 stairs with mod I and no UE support to incr safety when entering/exiting her home.   Time 4   Period Weeks   Status New     PT SHORT TERM GOAL #5   Title Pt will ambulate 528f with LRAD and supervision indoors over level surfaces, ramps, and curbs to incr safety when ambulating at home and in the community.   Time 4   Period Weeks   Status New           PT Long Term Goals - 08/01/16 1659      PT LONG TERM GOAL #1   Title Pt will be independent and verbalize understanding of HEP and on-going fitness program to maintain progress made in therapy and improve overall health. (Target Date for all LTGs: 09/27/16)    Time 8   Period Weeks   Status New     PT LONG TERM GOAL #2   Title Pt will improve Berg Balance score to 56/56 to indicate decr risk of falls.   Time 8   Period Weeks   Status New     PT LONG TERM GOAL #3   Title Pt will improve FGA score to > or = 27/30 to indicate decr risk of falls.   Time 8   Period Weeks   Status New     PT LONG TERM GOAL #4   Title Pt will negotiate 12 stairs with mod I while carrying a simulated load of laundry to incr safety while performing ADLs at home and while working.   Time 8   Period Weeks   Status New     PT LONG TERM GOAL #5   Title Pt will ambulate >10023fwith mod I and LRAD outdoors over unlevel surfaces, ramps, curbs, grass, and gravel to incr safety while ambulating in the community.   Time 8   Period Weeks   Status New            Plan - 08/13/16 1104    Clinical Impression Statement Today's  skilled session  continued to work on gait with LRAD and high level balance. Intitiated gait with straight cane today as pt tends to only use 1-2 points of her small based quad cane and does not appear to place increased weight through UE with gait. Pt was mostly min guard assist with only 1 episode of toe scuffing with intial first 20 feet of gait using straight cane needing min assist to correct balance. Pt needs encouragement to challenge balance due to fear of falling, however is steady needing min guard to min assist at times when challenged. Pt is making steady progress toward goals and should benefit from continued PT to progress toward unmet goals                                         Rehab Potential Good   Clinical Impairments Affecting Rehab Potential HTN   PT Frequency 2x / week   PT Duration 8 weeks   PT Treatment/Interventions ADLs/Self Care Home Management;Functional mobility training;Stair training;Gait training;DME Instruction;Therapeutic activities;Therapeutic exercise;Balance training;Neuromuscular re-education;Patient/family education;Manual techniques;Energy conservation   PT Next Visit Plan continues with L LE strengthening with functional balance and gait activities   Consulted and Agree with Plan of Care Patient;Family member/caregiver   Family Member Consulted Daughter      Patient will benefit from skilled therapeutic intervention in order to improve the following deficits and impairments:  Abnormal gait, Decreased activity tolerance, Decreased balance, Decreased mobility, Decreased knowledge of use of DME, Decreased endurance, Decreased strength, Impaired perceived functional ability, Improper body mechanics  Visit Diagnosis: Other abnormalities of gait and mobility  Unsteadiness on feet  Muscle weakness (generalized)     Problem List Patient Active Problem List   Diagnosis Date Noted  . Sleep disturbance 07/26/2016  . Overactive bladder 07/26/2016  . Tobacco  abuse 07/21/2016  . Nodule of right lung 07/21/2016  . Acute CVA (cerebrovascular accident) ([place]) 07/21/2016  . Acute lacunar stroke ([place]) 07/20/2016  . Hypokalemia 07/20/2016  . HTN (hypertension) 07/20/2016    Willow Ora, PTA, [facility] [address], [place], [place] [address] [phone] 08/13/16, 11:54 AM   Name: RANEEN JAFFER MRN: [id number] Date of Birth: 12-29-1945

## 2016-08-15 ENCOUNTER — Ambulatory Visit: Payer: Medicare HMO | Admitting: Physical Therapy

## 2016-08-15 ENCOUNTER — Encounter: Payer: Self-pay | Admitting: Physical Therapy

## 2016-08-15 DIAGNOSIS — M6281 Muscle weakness (generalized): Secondary | ICD-10-CM | POA: Diagnosis not present

## 2016-08-15 DIAGNOSIS — R2681 Unsteadiness on feet: Secondary | ICD-10-CM | POA: Diagnosis not present

## 2016-08-15 DIAGNOSIS — R2689 Other abnormalities of gait and mobility: Secondary | ICD-10-CM | POA: Diagnosis not present

## 2016-08-15 NOTE — Patient Instructions (Signed)
  Feet Together, Head Motion - Eyes Closed    With eyes closed and feet together, move head slowly, up and down. Repeat ____ times per session. Do ____ sessions per day.  Copyright  VHI. All rights reserved.  Feet Together, Head Motion - Eyes Closed    With eyes closed and feet together, move head slowly, up and down. Repeat 3 times per session. Do 1-2 sessions per day.  Copyright  VHI. All rights reserved.    Feet Apart (Compliant Surface) Head Motion - Eyes Closed    Stand on compliant surface: Pillows with feet shoulder width apart. Close eyes and move head slowly, up and down. Repeat 3 times per session. Do 1-2 sessions per day.  Copyright  VHI. All rights reserved.  Feet Together (Compliant Surface) Head Motion - Eyes Closed    Stand on compliant surface: Pillows with feet together. Close eyes and move head slowly, up and down. Repeat 3 times per session. Do 1-2 sessions per day.  Copyright  VHI. All rights reserved.

## 2016-08-15 NOTE — Therapy (Signed)
[facility] [address] [place], [place], [address] Phone: [phone]   Fax:  [phone]  Physical Therapy Treatment  Patient Details  Name: Kathleen Guerrero MRN: [id number] Date of Birth: 09/12/1946 Referring Provider: Antony Contras, MD  Encounter Date: 08/15/2016      PT End of Session - 08/15/16 0902    Visit Number 4   Number of Visits 17   Date for PT Re-Evaluation 09/30/16   Authorization Type [employer] Medicare   Authorization Time Period G-Code every 10th visit   PT Start Time 0800   PT Stop Time 0845   PT Time Calculation (min) 45 min   Activity Tolerance Patient tolerated treatment well   Behavior During Therapy [facility] for tasks assessed/performed      Past Medical History:  Diagnosis Date  . Hypertension   . Stroke ([place])   . Tobacco use     Past Surgical History:  Procedure Laterality Date  . DERMOID CYST  EXCISION    . EP IMPLANTABLE DEVICE N/A 07/23/2016   Procedure: Loop Recorder Insertion;  Surgeon:  Grayer, MD;  Location: [place] [facility];  Guerrero: Cardiovascular;  Laterality: N/A;  . Intestinal obstruction    . TEE WITHOUT CARDIOVERSION N/A 07/23/2016   Procedure: TRANSESOPHAGEAL ECHOCARDIOGRAM (TEE);  Surgeon: Lelon Perla, MD;  Location: [facility] ENDOSCOPY;  Guerrero: Cardiovascular;  Laterality: N/A;    There were no vitals filed for this visit.      Subjective Assessment - 08/15/16 0755    Subjective Pt reports she has been doing chair yoga and attending [employer] the last several weeks with no issues.  She also state she has been walking for 12 minutes.   Patient is accompained by: Family member  Daughter   Limitations Standing;Walking;House hold activities   How long can you stand comfortably? 10-15 min   How long can you walk comfortably? >1000 ft   Patient Stated Goals "I want to improve my balance and be stronger.  Want my L leg not to fell weak."                          [facility] Adult PT Treatment/Exercise - 08/15/16 0800      Transfers   Transfers Sit to Stand;Stand to Sit;Stand Pivot Transfers   Sit to Stand 6: Modified independent (Device/Increase time);Without upper extremity assist;From bed   Stand to Sit 6: Modified independent (Device/Increase time);Without upper extremity assist;To bed   Stand Pivot Transfers 6: Modified independent (Device/Increase time)     Ambulation/Gait   Ambulation/Gait Yes   Ambulation/Gait Assistance 5: Supervision   Ambulation/Gait Assistance Details Pt used SBQC hen entering/exiting therapy session and no AD during session.   Ambulation Distance (Feet) 100 Feet  2x100', 7x30'   Assistive device None;Small based quad cane   Gait Pattern Step-through pattern;Decreased arm swing - right;Decreased arm swing - left;Decreased stride length;Decreased hip/knee flexion - left;Decreased dorsiflexion - left;Trunk flexed;Narrow base of support;Poor foot clearance - left   Ambulation Surface Level;Indoor     High Level Balance   High Level Balance Activities Side stepping;Braiding;Backward walking;Head turns   High Level Balance Comments Pt performed dynamic gait activities with supervision and minA with one ant LOB when she experienced L foot drag.  Verbal and demo cuing for sequencing and to incr her step length with backward ambulation.     Neuro Re-ed    Neuro Re-ed Details  Pt performed 2x15 sit<>stand  with 2" block under her R foot to incr muscle activation of the L LE.  Verbal cuing needed to slowly sit back to the mat table and to incr forward lean to incr muscle activation in L LE.             Balance Exercises - 08/15/16 0858      Balance Exercises: Standing   Rockerboard Head turns;EO;30 seconds;Intermittent UE support   Other Standing Exercises Pt performed ant/post rockerboard with supervision and with verbal cuing needed for sequencing.  Pt able to tolerate activity  alothough experienced several times in which she neded to catch herself on the parallel bars to maintain balance.   Overall Comments Other (comment)  Pt performed ant/post rockerboard with supervision and with            PT Education - 08/15/16 0901    Education provided Yes   Education Details Pt educated on new HEP and to continue walking program at home.   Person(s) Educated Patient   Methods Explanation;Demonstration;Verbal cues;Handout   Comprehension Verbalized understanding;Returned demonstration          PT Short Term Goals - 08/01/16 1652      PT SHORT TERM GOAL #1   Title Pt will be independent and verbaliz understanding of initial HEP to progress her functional status. (Target Date for all STGs: 08/30/16)   Time 4   Period Weeks   Status New     PT SHORT TERM GOAL #2   Title Pt will improve Berg Balance score to >52/56 to decr fall risk.   Time 4   Period Weeks   Status New     PT SHORT TERM GOAL #3   Title Pt will improve FGA score to >or = 23/30 to indicate decr fall risk.   Time 4   Period Weeks   Status New     PT SHORT TERM GOAL #4   Title Pt will negotiate 8 stairs with mod I and no UE support to incr safety when entering/exiting her home.   Time 4   Period Weeks   Status New     PT SHORT TERM GOAL #5   Title Pt will ambulate 524f with LRAD and supervision indoors over level surfaces, ramps, and curbs to incr safety when ambulating at home and in the community.   Time 4   Period Weeks   Status New           PT Long Term Goals - 08/01/16 1659      PT LONG TERM GOAL #1   Title Pt will be independent and verbalize understanding of HEP and on-going fitness program to maintain progress made in therapy and improve overall health. (Target Date for all LTGs: 09/27/16)    Time 8   Period Weeks   Status New     PT LONG TERM GOAL #2   Title Pt will improve Berg Balance score to 56/56 to indicate decr risk of falls.   Time 8   Period Weeks    Status New     PT LONG TERM GOAL #3   Title Pt will improve FGA score to > or = 27/30 to indicate decr risk of falls.   Time 8   Period Weeks   Status New     PT LONG TERM GOAL #4   Title Pt will negotiate 12 stairs with mod I while carrying a simulated load of laundry to incr safety while performing ADLs at home and  while working.   Time 8   Period Weeks   Status New     PT LONG TERM GOAL #5   Title Pt will ambulate >1035f with mod I and LRAD outdoors over unlevel surfaces, ramps, curbs, grass, and gravel to incr safety while ambulating in the community.   Time 8   Period Weeks   Status New               Plan - 08/15/16 0902    Clinical Impression Statement Pt was able to tolerate higher level balance and gait activities designed to challenge her functional mobility.  She performed all activities without any AD although did ambulate in high guard requiring verbal cuing to relax B UE and let arms swing freely.  This indicates pt continues to have some fear of walking without AD and she verbalized that her L LE is not where she would like it to be, however, it is improving.  Pt continues to have deficits in LE strength, balance, and functional mobiility and would continue to benefit from skilled PT to address her deficits.   Rehab Potential Good   Clinical Impairments Affecting Rehab Potential HTN   PT Frequency 2x / week   PT Duration 8 weeks   PT Treatment/Interventions ADLs/Self Care Home Management;Functional mobility training;Stair training;Gait training;DME Instruction;Therapeutic activities;Therapeutic exercise;Balance training;Neuromuscular re-education;Patient/family education;Manual techniques;Energy conservation   PT Next Visit Plan continues with L LE strengthening with functional balance and gait activities; assess use of SPC in community rather than [facility]  Consulted and Agree with Plan of Care Patient      Patient will benefit from skilled therapeutic  intervention in order to improve the following deficits and impairments:  Abnormal gait, Decreased activity tolerance, Decreased balance, Decreased mobility, Decreased knowledge of use of DME, Decreased endurance, Decreased strength, Impaired perceived functional ability, Improper body mechanics  Visit Diagnosis: Unsteadiness on feet  Other abnormalities of gait and mobility  Muscle weakness (generalized)     Problem List Patient Active Problem List   Diagnosis Date Noted  . Sleep disturbance 07/26/2016  . Overactive bladder 07/26/2016  . Tobacco abuse 07/21/2016  . Nodule of right lung 07/21/2016  . Acute [place] 07/21[place]2016  . [place] 07/20/2016  . Hypokalemia 07/20/2016  . HTN (hypertension) 07/20/2016    NKatherine Mantle SPT 08/15/2016, 9:16 AM  [facility] [address]Phone: [phone]  Fax:  [phone] Name: Kathleen YOWMRN: [id number] of Birth: [birth year]

## 2016-08-20 ENCOUNTER — Ambulatory Visit: Payer: Medicare HMO | Admitting: Physical Therapy

## 2016-08-20 DIAGNOSIS — M6281 Muscle weakness (generalized): Secondary | ICD-10-CM | POA: Diagnosis not present

## 2016-08-20 DIAGNOSIS — R2689 Other abnormalities of gait and mobility: Secondary | ICD-10-CM | POA: Diagnosis not present

## 2016-08-20 DIAGNOSIS — R2681 Unsteadiness on feet: Secondary | ICD-10-CM

## 2016-08-20 NOTE — Patient Instructions (Signed)
Calf Stretch    Place hands on wall at shoulder height. Keeping back leg straight, bend front leg, feet pointing forward, heels flat on floor. Lean forward slightly until stretch is felt in calf of back leg. Hold stretch _45__ seconds, breathing slowly in and out. Repeat stretch with other leg back. Do _3-4__ sessions per day on each leg.  ANKLE: Dorsiflexion - Standing    Stand facing counter with both hands on countertop and feet shoulder width apart. Raise toes of both feet up at same time. _15-20__ reps per set, _2__ sets per day, _5-7__ days per week.  HIP: Hamstrings - Short Sitting    Rest leg on raised surface. Keep knee straight. Lift chest. Hold _45__ seconds. _4__ reps per set, _1-2__ sets per day on each leg, _5-7__ days per week  Copyright  VHI. All rights reserved.  Functional Quadriceps: Sit to Stand    Sit on edge of chair, feet flat on floor, left foot back and right foot forward. Stand upright, extending knees fully (using left leg more than right). Repeat _12__ times per set. Do _1_ sets per session. Do _1-2_ sessions per day.    Feet Heel-Toe "Tandem"    Arms at side with finger tip support on counter as needed for balance: walk a straight line bringing one foot directly in front of the other along counter top and then walk a straight line backwards bringing one foot directly behind the other one.  Hold each position for 2 seconds. (Note: stand a little closer to the countertop than you were before). Repeat for 3 laps each way session. Do _1-2_ sessions per day.  Copyright  VHI. All rights reserved.   Walking Program:  Begin walking for exercise for 6 minutes, 1-2 times/day, 5 days/week.   - Be sure to wear good walking shoes - Walk in a safe environment - Only progress to your tolerance, when the current time is easy, add 2       minutes at that time   - Use cane when walking outdoors or for longer times for now. If walking   short times in home,  you can walk without the cane.

## 2016-08-20 NOTE — Therapy (Signed)
[facility] [address] [place], [place], [address] Phone: [phone]   Fax:  [phone]  Physical Therapy Treatment  Patient Details  Name: Kathleen Guerrero MRN: [id number] Date of Birth: Oct 06, 1946 Referring Provider: Antony Contras, MD  Encounter Date: 08/20/2016      PT End of Session - 08/20/16 0913    Visit Number 5   Number of Visits 17   Date for PT Re-Evaluation 09/30/16   Authorization Type [employer] Medicare   Authorization Time Period G-Code every 10th visit   PT Start Time 0803   PT Stop Time 0845   PT Time Calculation (min) 42 min   Activity Tolerance Patient tolerated treatment well   Behavior During Therapy [facility] for tasks assessed/performed      Past Medical History:  Diagnosis Date  . Hypertension   . Stroke ([place])   . Tobacco use     Past Surgical History:  Procedure Laterality Date  . DERMOID CYST  EXCISION    . EP IMPLANTABLE DEVICE N/A 07/23/2016   Procedure: Loop Recorder Insertion;  Surgeon: Thompson Grayer, MD;  Location: [place] [facility];  Service: Cardiovascular;  Laterality: N/A;  . Intestinal obstruction    . TEE WITHOUT CARDIOVERSION N/A 07/23/2016   Procedure: TRANSESOPHAGEAL ECHOCARDIOGRAM (TEE);  Surgeon: Lelon Perla, MD;  Location: [facility] ENDOSCOPY;  Service: Cardiovascular;  Laterality: N/A;    There were no vitals filed for this visit.      Subjective Assessment - 08/20/16 0804    Subjective "I was so sore after the last session that it threw off my balance." Pt reports no soreness at this time. No falls. Pt also states, "I do continue to use the (quad) cane outside the house; but I'm depending on it less and less." Pt also requesting to work on getting into/out of floor so pt may return to chair yoga.   Limitations Standing;Walking;House hold activities   Patient Stated Goals "I want to improve my balance and be stronger.  Want my L leg not to fell weak."   Currently in Pain?  No/denies                         [facility] Adult PT Treatment/Exercise - 08/20/16 0001      Transfers   Transfers Sit to Stand;Stand to Sit;Floor to Transfer   Sit to Stand 6: Modified independent (Device/Increase time)   Stand to Sit 6: Modified independent (Device/Increase time)   Floor to Transfer 5: Supervision   Comments Blocked practice of transfer from supine on floor to standing, initially with min guard to min A, progressing to mod I after 8 reps total without UE use. Initially attempted to transition from supine on floor > prone > quadruped > half kneeling > standing; however, pt with increased difficulty transitioning from half kneeling > standing due to pt-perceived weakness and knee pain. Therefore, transitioned to transitioning from quadruped > full squat with BUE support on floor > standing, which was successful.      Ambulation/Gait   Ambulation/Gait Yes   Ambulation/Gait Assistance 5: Supervision   Ambulation/Gait Assistance Details Gait [phone]' without AD with LOB x2 due to L toe catch. After L ankle stretching, strengthening, and PNF (see TE and NMR interventions below), performed gait x230' over level, indoor surfaces without AD with cueing for attention to L ankle DF during LLE advancement, for consistent L heel strike with effective return demo.   Ambulation Distance (  Feet) 345 Feet   Assistive device None  utilized QC when ambulating into/out clinic today   Gait Pattern Step-through pattern;Decreased arm swing - right;Decreased arm swing - left;Decreased stride length;Decreased hip/knee flexion - left;Decreased dorsiflexion - left;Trunk flexed;Narrow base of support;Poor foot clearance - left   Ambulation Surface Level;Indoor     Neuro Re-ed    Neuro Re-ed Details  Supine LLE D2 flexion/extension x10 reps rhythmic initiation, x10 reps resisted with multimodal cueing focused on selective control of L ankle dorsiflexion while maintaining L knee extension, while  initiating L knee flexion.     Exercises   Exercises Other Exercises   Other Exercises  Standing B ankle plantarflexor self-stretch 2 x45-sec holds per side; standing active B ankle self-stretch x20 reps with BUE support, cueing for wider BOS. Reviewed seated B hamstring self-stretch x60-sec holds per side. Reviewed/progressed sit <> stands from [facility] x12 reps without UE support.                PT Education - 08/20/16 0849    Education provided Yes   Education Details HEP progressed; see Pt Instructions for details. Floor transfer, fall recovery education.    Person(s) Educated Patient   Methods Explanation;Demonstration;Handout;Verbal cues   Comprehension Verbalized understanding;Returned demonstration          PT Short Term Goals - 08/01/16 1652      PT SHORT TERM GOAL #1   Title Pt will be independent and verbaliz understanding of initial HEP to progress her functional status. (Target Date for all STGs: 08/30/16)   Time 4   Period Weeks   Status New     PT SHORT TERM GOAL #2   Title Pt will improve Berg Balance score to >52/56 to decr fall risk.   Time 4   Period Weeks   Status New     PT SHORT TERM GOAL #3   Title Pt will improve FGA score to >or = 23/30 to indicate decr fall risk.   Time 4   Period Weeks   Status New     PT SHORT TERM GOAL #4   Title Pt will negotiate 8 stairs with mod I and no UE support to incr safety when entering/exiting her home.   Time 4   Period Weeks   Status New     PT SHORT TERM GOAL #5   Title Pt will ambulate 551f with LRAD and supervision indoors over level surfaces, ramps, and curbs to incr safety when ambulating at home and in the community.   Time 4   Period Weeks   Status New           PT Long Term Goals - 08/01/16 1659      PT LONG TERM GOAL #1   Title Pt will be independent and verbalize understanding of HEP and on-going fitness program to maintain progress made in therapy and improve overall health. (Target  Date for all LTGs: 09/27/16)    Time 8   Period Weeks   Status New     PT LONG TERM GOAL #2   Title Pt will improve Berg Balance score to 56/56 to indicate decr risk of falls.   Time 8   Period Weeks   Status New     PT LONG TERM GOAL #3   Title Pt will improve FGA score to > or = 27/30 to indicate decr risk of falls.   Time 8   Period Weeks   Status New  PT LONG TERM GOAL #4   Title Pt will negotiate 12 stairs with mod I while carrying a simulated load of laundry to incr safety while performing ADLs at home and while working.   Time 8   Period Weeks   Status New     PT LONG TERM GOAL #5   Title Pt will ambulate >1010f with mod I and LRAD outdoors over unlevel surfaces, ramps, curbs, grass, and gravel to incr safety while ambulating in the community.   Time 8   Period Weeks   Status New               Plan - 08/20/16 0914    Clinical Impression Statement Session focused on addressing floor transfer (per pt request) and on progressing HEP. Noted intermittent L toe catch during gait without AD. Suspect this is due to decreased gastrocnemius extensibility and impaired selective control of L ankle DF. Modified HEP to address these impairments.    Rehab Potential Good   Clinical Impairments Affecting Rehab Potential HTN   PT Frequency 2x / week   PT Duration 8 weeks   PT Treatment/Interventions ADLs/Self Care Home Management;Functional mobility training;Stair training;Gait training;DME Instruction;Therapeutic activities;Therapeutic exercise;Balance training;Neuromuscular re-education;Patient/family education;Manual techniques;Energy conservation   PT Next Visit Plan Continue gait training without AD with obstacle negotiation, community obstacles. NMR with emphasis on selective control of L ankle DF, LLE strengthening, stretching L gastroc.    Consulted and Agree with Plan of Care Patient      Patient will benefit from skilled therapeutic intervention in order to  improve the following deficits and impairments:  Abnormal gait, Decreased activity tolerance, Decreased balance, Decreased mobility, Decreased knowledge of use of DME, Decreased endurance, Decreased strength, Impaired perceived functional ability, Improper body mechanics  Visit Diagnosis: Unsteadiness on feet  Other abnormalities of gait and mobility  Muscle weakness (generalized)     Problem List Patient Active Problem List   Diagnosis Date Noted  . Sleep disturbance 07/26/2016  . Overactive bladder 07/26/2016  . Tobacco abuse 07/21/2016  . Nodule of right lung 07/21/2016  . [place] 07/[place]  . [place]unar stroke ([place] 07/20/2016  . Hypokalemia 07/20/2016  . HTN (hypertension) 07/20/2016   BBillie Ruddy PT, DPT [facility]937 [address]SKing CityGPiney [place] [address]Phone: [phone]  Fax:  [phone]/17/17, 9:25 AM  Name: PKEENA DINSEMRN: [id number] of Birth: 512-Jan-1947

## 2016-08-22 ENCOUNTER — Other Ambulatory Visit: Payer: Self-pay | Admitting: Family

## 2016-08-22 ENCOUNTER — Ambulatory Visit: Payer: Medicare HMO | Admitting: Physical Therapy

## 2016-08-22 ENCOUNTER — Ambulatory Visit (INDEPENDENT_AMBULATORY_CARE_PROVIDER_SITE_OTHER): Payer: Medicare HMO | Admitting: *Deleted

## 2016-08-22 DIAGNOSIS — I639 Cerebral infarction, unspecified: Secondary | ICD-10-CM

## 2016-08-22 DIAGNOSIS — R2689 Other abnormalities of gait and mobility: Secondary | ICD-10-CM

## 2016-08-22 DIAGNOSIS — M6281 Muscle weakness (generalized): Secondary | ICD-10-CM | POA: Diagnosis not present

## 2016-08-22 DIAGNOSIS — R2681 Unsteadiness on feet: Secondary | ICD-10-CM | POA: Diagnosis not present

## 2016-08-22 NOTE — Therapy (Signed)
[facility] [address] [place], [place], [address] Phone: [phone]   Fax:  [phone]  Physical Therapy Treatment  Patient Details  Name: Kathleen Guerrero MRN: [id number] Date of Birth: Mar 24, 1946 Referring Provider: Antony Contras, MD  Encounter Date: 08/22/2016      PT End of Session - 08/22/16 1309    Visit Number 6   Number of Visits 17   Date for PT Re-Evaluation 09/30/16   Authorization Type Aetna Medicare   Authorization Time Period G-Code every 10th visit   PT Start Time 1016   PT Stop Time 1100   PT Time Calculation (min) 44 min   Activity Tolerance Patient tolerated treatment well   Behavior During Therapy [facility] for tasks assessed/performed      Past Medical History:  Diagnosis Date  . Hypertension   . Stroke ([place])   . Tobacco use     Past Surgical History:  Procedure Laterality Date  . DERMOID CYST  EXCISION    . EP IMPLANTABLE DEVICE N/A 07/23/2016   Procedure: Loop Recorder Insertion;  Surgeon:  Grayer, MD;  Location: [place] [facility];  Service: Cardiovascular;  Laterality: N/A;  . Intestinal obstruction    . TEE WITHOUT CARDIOVERSION N/A 07/23/2016   Procedure: TRANSESOPHAGEAL ECHOCARDIOGRAM (TEE);  Surgeon: Lelon Perla, MD;  Location: [facility] ENDOSCOPY;  Service: Cardiovascular;  Laterality: N/A;    There were no vitals filed for this visit.      Subjective Assessment - 08/22/16 1024    Subjective Pt reports "I've done great since last time".  She states she has been working on walking "heel/toe, heel/toe" and it has decr her episodes of toe catching.   Patient is accompained by: Family member  Daughter   Limitations Standing;Walking;House hold activities   How long can you stand comfortably? 10-15 min   How long can you walk comfortably? >1000 ft   Patient Stated Goals "I want to improve my balance and be stronger.  Want my L leg not to fell weak."   Currently in Pain?  No/denies   Multiple Pain Sites No            [facility] PT Assessment - 08/22/16 1015      Functional Gait  Assessment   Gait assessed  Yes   Gait Level Surface Walks 20 ft in less than 5.5 sec, no assistive devices, good speed, no evidence for imbalance, normal gait pattern, deviates no more than 6 in outside of the 12 in walkway width.   Change in Gait Speed Able to smoothly change walking speed without loss of balance or gait deviation. Deviate no more than 6 in outside of the 12 in walkway width.   Gait with Horizontal Head Turns Performs head turns smoothly with slight change in gait velocity (eg, minor disruption to smooth gait path), deviates 6-10 in outside 12 in walkway width, or uses an assistive device.   Gait with Vertical Head Turns Performs head turns with no change in gait. Deviates no more than 6 in outside 12 in walkway width.   Gait and Pivot Turn Pivot turns safely within 3 sec and stops quickly with no loss of balance.   Step Over Obstacle Is able to step over 2 stacked shoe boxes taped together (9 in total height) without changing gait speed. No evidence of imbalance.   Gait with Narrow Base of Support Ambulates less than 4 steps heel to toe or cannot perform without assistance.  Gait with Eyes Closed Walks 20 ft, slow speed, abnormal gait pattern, evidence for imbalance, deviates 10-15 in outside 12 in walkway width. Requires more than 9 sec to ambulate 20 ft.   Ambulating Backwards Walks 20 ft, slow speed, abnormal gait pattern, evidence for imbalance, deviates 10-15 in outside 12 in walkway width.   Steps Alternating feet, no rail.   Total Score 22                     [facility] Adult PT Treatment/Exercise - 08/22/16 1015      Transfers   Transfers Sit to Stand;Stand to [employer] Transfers   Sit to Stand 6: Modified independent (Device/Increase time);Without upper extremity assist;From bed   Stand to Sit 6: Modified independent (Device/Increase  time);Without upper extremity assist;To bed   Stand Pivot Transfers 6: Modified independent (Device/Increase time)     Ambulation/Gait   Ambulation/Gait Yes   Ambulation/Gait Assistance 5: Supervision   Ambulation Distance (Feet) 100 Feet  2x100', 8x40', 2x20'   Assistive device None   Gait Pattern Step-through pattern;Decreased arm swing - right;Decreased arm swing - left;Decreased stride length;Decreased hip/knee flexion - left;Decreased dorsiflexion - left;Trunk flexed;Narrow base of support;Poor foot clearance - left   Ambulation Surface Level;Indoor   Stairs Yes   Stairs Assistance 6: Modified independent (Device/Increase time)   Stair Management Technique No rails;Alternating pattern   Number of Stairs 8   Height of Stairs 6     Standardized Balance Assessment   Standardized Balance Assessment Berg Balance Test     Berg Balance Test   Sit to Stand Able to stand without using hands and stabilize independently   Standing Unsupported Able to stand safely 2 minutes   Sitting with Back Unsupported but Feet Supported on Floor or Stool Able to sit safely and securely 2 minutes   Stand to Sit Sits safely with minimal use of hands   Transfers Able to transfer safely, minor use of hands   Standing Unsupported with Eyes Closed Able to stand 10 seconds safely   Standing Ubsupported with Feet Together Able to place feet together independently and stand 1 minute safely   From Standing, Reach Forward with Outstretched Arm Can reach confidently >25 cm (10")   From Standing Position, Pick up Object from Floor Able to pick up shoe safely and easily   From Standing Position, Turn to Look Behind Over each Shoulder Looks behind from both sides and weight shifts well   Turn 360 Degrees Able to turn 360 degrees safely in 4 seconds or less   Standing Unsupported, Alternately Place Feet on Step/Stool Able to stand independently and safely and complete 8 steps in 20 seconds   Standing Unsupported, One  Foot in Front Able to plae foot ahead of the other independently and hold 30 seconds   Standing on One Leg Tries to lift leg/unable to hold 3 seconds but remains standing independently   Total Score 52                PT Education - 08/22/16 1308    Education provided Yes   Education Details Pt educated on new HEP (see pt instructions) and update POC to decr frequency to once per week.   Person(s) Educated Patient   Methods Explanation;Demonstration;Verbal cues;Handout   Comprehension Verbalized understanding;Returned demonstration          PT Short Term Goals - 08/22/16 1050      PT SHORT TERM GOAL #1   Title  Pt will be independent and verbaliz understanding of initial HEP to progress her functional status. (Target Date for all STGs: 08/30/16)   Baseline GOAL MET: 08/22/16   Time 4   Period Weeks   Status Achieved     PT SHORT TERM GOAL #2   Title Pt will improve Berg Balance score to >52/56 to decr fall risk.   Baseline GOAL PARTIALLY MET: 08/22/16 (52/56)   Time 4   Period Weeks   Status Partially Met     PT SHORT TERM GOAL #3   Title Pt will improve FGA score to >or = 23/30 to indicate decr fall risk.   Baseline 08/22/16 - 22/30   Time 4   Period Weeks   Status On-going     PT SHORT TERM GOAL #4   Title Pt will negotiate 8 stairs with mod I and no UE support to incr safety when entering/exiting her home.   Baseline GOAL MET: 08/22/16   Time 4   Period Weeks   Status Achieved     PT SHORT TERM GOAL #5   Title Pt will ambulate 54f with LRAD and supervision indoors over level surfaces, ramps, and curbs to incr safety when ambulating at home and in the community.   Time 4   Period Weeks   Status On-going           PT Long Term Goals - 08/01/16 1659      PT LONG TERM GOAL #1   Title Pt will be independent and verbalize understanding of HEP and on-going fitness program to maintain progress made in therapy and improve overall health. (Target Date for  all LTGs: 09/27/16)    Time 8   Period Weeks   Status New     PT LONG TERM GOAL #2   Title Pt will improve Berg Balance score to 56/56 to indicate decr risk of falls.   Time 8   Period Weeks   Status New     PT LONG TERM GOAL #3   Title Pt will improve FGA score to > or = 27/30 to indicate decr risk of falls.   Time 8   Period Weeks   Status New     PT LONG TERM GOAL #4   Title Pt will negotiate 12 stairs with mod I while carrying a simulated load of laundry to incr safety while performing ADLs at home and while working.   Time 8   Period Weeks   Status New     PT LONG TERM GOAL #5   Title Pt will ambulate >10065fwith mod I and LRAD outdoors over unlevel surfaces, ramps, curbs, grass, and gravel to incr safety while ambulating in the community.   Time 8   Period Weeks   Status New               Plan - 08/22/16 1309    Clinical Impression Statement Pt peformed well in today's session and expressed concern of frequency of PT due to high co-pay.  PT discussed with pt the possibility of decr frequency to once per week due to excellent progress pt has made since initial eval.  STG were assessed with pt scoring a 52/56 on the Berg Balance test indicating an incr in static balance while scoring a 22/30 on the FGA indicating improvement in functionl and dynamic mobility.  Pt was also able to negotiate stair at a mod I level which increases her safety entering and exiting her home.  Based on significant improvement, PT and pt agreed to reduce PT frequency to once per week where she will continue to work toward LTGs and improve deficits.   Rehab Potential Good   Clinical Impairments Affecting Rehab Potential HTN   PT Frequency 1x / week   PT Duration 8 weeks   PT Treatment/Interventions ADLs/Self Care Home Management;Functional mobility training;Stair training;Gait training;DME Instruction;Therapeutic activities;Therapeutic exercise;Balance training;Neuromuscular  re-education;Patient/family education;Manual techniques;Energy conservation   PT Next Visit Plan Assess remaining STGs, progress ambulation with obstacles and community obstacles, strengthen L LE and DF.   Consulted and Agree with Plan of Care Patient      Patient will benefit from skilled therapeutic intervention in order to improve the following deficits and impairments:  Abnormal gait, Decreased activity tolerance, Decreased balance, Decreased mobility, Decreased knowledge of use of DME, Decreased endurance, Decreased strength, Impaired perceived functional ability, Improper body mechanics  Visit Diagnosis: Other abnormalities of gait and mobility  Unsteadiness on feet  Muscle weakness (generalized)     Problem List Patient Active Problem List   Diagnosis Date Noted  . Sleep disturbance 07/26/2016  . Overactive bladder 07/26/2016  . Tobacco abuse 07/21/2016  . Nodule of right lung 07/21/2016  . Acute CVA (cerebrovascular accident) ([place]) 07/21/2016  . Acute lacunar stroke ([place]) 07/20/2016  . Hypokalemia 07/20/2016  . HTN (hypertension) 07/20/2016    Katherine Mantle, SPT 08/23/2016, 3:03 PM  [facility] [address] [place], [place], [address] Phone: [phone]   Fax:  [phone]  Name: Kathleen Guerrero MRN: [id number] Date of Birth: [birth year]

## 2016-08-22 NOTE — Patient Instructions (Addendum)
Calf Stretch    Place hands on wall at shoulder height. Keeping back leg straight, bend front leg, feet pointing forward, heels flat on floor. Lean forward slightly until stretch is felt in calf of back leg. Hold stretch _45__ seconds, breathing slowly in and out. Repeat stretch with other leg back. Do _3-4__ sessions per day on each leg.  ANKLE: Dorsiflexion - Standing    Stand facing counter with both hands on countertop and feet shoulder width apart. Raise toes of both feet up at same time. _15-20__ reps per set, _2__ sets per day, _5-7__ days per week.  HIP: Hamstrings - Short Sitting    Rest leg on raised surface. Keep knee straight. Lift chest. Hold _45__ seconds. _4__ reps per set, _1-2__ sets per day on each leg, _5-7__ days per week  Copyright  VHI. All rights reserved. Functional Quadriceps: Sit to Stand    Sit on edge of chair, feet flat on floor, left foot back and right foot forward. Stand upright, extending knees fully (using left leg more than right). Repeat _12__ times per set. Do _1_ sets per session. Do _1-2_ sessions per day.   Feet Heel-Toe "Tandem"    Arms at side with finger tip support on counter as needed for balance:walk a straight line bringing one foot directly in front of the other along counter top and then walk a straight line backwards bringing one foot directly behind the other one.  Hold each position for 2 seconds. (Note: stand a little closer to the countertop than you were before). Repeat for 3 laps each way session. Do _1-2_ sessions per day.  Copyright  VHI. All rights reserved.  Walking Program:  Begin walking for exercise for 79mnutes, 1-2times/day, 5days/week.  - Be sure to wear good walking shoes - Walk in a safe environment - Only progress to your tolerance, when the current time is easy, add 2 minutes at that time  - Use cane when walking outdoors or for longer times for now. If walking short times in  home, you can walk without the cane.    Begin in a long sitting position with both legs supported. Tie one end of the theraband around your foot and anchor the other end around a stable surface (table leg, free weight, etc.). Pull your toes up towards your body, then slowly lower back to starting position.  Perform 2-3 sets for 20 reps 1-2 times per day.

## 2016-08-23 NOTE — Progress Notes (Signed)
[facility] Summary Report / Loop Recorder 

## 2016-08-27 ENCOUNTER — Ambulatory Visit: Payer: Medicare HMO | Admitting: Physical Therapy

## 2016-08-27 ENCOUNTER — Encounter: Payer: Self-pay | Admitting: Physical Therapy

## 2016-08-27 DIAGNOSIS — M6281 Muscle weakness (generalized): Secondary | ICD-10-CM

## 2016-08-27 DIAGNOSIS — R2681 Unsteadiness on feet: Secondary | ICD-10-CM

## 2016-08-27 DIAGNOSIS — R2689 Other abnormalities of gait and mobility: Secondary | ICD-10-CM

## 2016-08-27 NOTE — Therapy (Signed)
[facility] [address] [place], [place], [address] Phone: [phone]   Fax:  [phone]  Physical Therapy Treatment  Patient Details  Name: Kathleen Guerrero MRN: [id number] Date of Birth: [birth year] Referring Provider: Antony Contras, MD  Encounter Date: 08/27/2016      PT End of Session - 08/27/16 1519    Visit Number 7   Number of Visits 17   Date for PT Re-Evaluation 09/30/16   Authorization Type [employer] Medicare   Authorization Time Period G-Code every 10th visit   PT Start Time 0930   PT Stop Time 1015   PT Time Calculation (min) 45 min   Activity Tolerance Patient tolerated treatment well   Behavior During Therapy [facility] for tasks assessed/performed      Past Medical History:  Diagnosis Date  . Hypertension   . Stroke ([place])   . Tobacco use     Past Surgical History:  Procedure Laterality Date  . DERMOID CYST  EXCISION    . EP IMPLANTABLE DEVICE N/A 07/23/2016   Procedure: Loop Recorder Insertion;  Surgeon:  Grayer, MD;  Location: [place] [facility];  Service: Cardiovascular;  Laterality: N/A;  . Intestinal obstruction    . TEE WITHOUT CARDIOVERSION N/A 07/23/2016   Procedure: TRANSESOPHAGEAL ECHOCARDIOGRAM (TEE);  Surgeon: Lelon Perla, MD;  Location: [facility] ENDOSCOPY;  Service: Cardiovascular;  Laterality: N/A;    There were no vitals filed for this visit.      Subjective Assessment - 08/27/16 0929    Subjective Pt reports going to a soccer game and having a hard time standing for a "really long time" and reported difficulty "stepping over a small fence".  She states she feels like her L leg is weak and "no where near where I want it to be".   Patient is accompained by: Family member  Daughter   Limitations Standing;Walking;House hold activities   How long can you stand comfortably? 10-15 min   How long can you walk comfortably? >1000 ft   Patient Stated Goals "I want to improve my balance and  be stronger.  Want my L leg not to fell weak."   Currently in Pain? No/denies   Multiple Pain Sites No                         [facility] Adult PT Treatment/Exercise - 08/27/16 0930      Transfers   Transfers Sit to Stand;Stand to Sit;Stand Pivot Transfers   Sit to Stand 6: Modified independent (Device/Increase time)   Stand to Sit 6: Modified independent (Device/Increase time)   Stand Pivot Transfers 6: Modified independent (Device/Increase time)     Ambulation/Gait   Ambulation/Gait Yes   Ambulation/Gait Assistance 6: Modified independent (Device/Increase time)   Ambulation/Gait Assistance Details No LOB ro toe catching with indoor or outdoor ambulation.   Ambulation Distance (Feet) 500 Feet  1x500',1x250', 2x100', 2x40'   Assistive device None   Gait Pattern Step-through pattern;Decreased arm swing - right;Decreased arm swing - left;Trunk flexed   Ambulation Surface Level;Unlevel;Indoor;Outdoor;Paved;Grass   Stairs Yes   Stairs Assistance 5: Supervision;6: Modified independent (Device/Increase time)   Stairs Assistance Details (indicate cue type and reason) Pt negotiated stairs mod I without carrying task and supervision when carrying a basket with laundry.  Noted decr in step speed with carrying task.   Stair Management Technique No rails;Alternating pattern   Number of Stairs 8  1x8 without task; 1x8 while carrying basket  of laundry   Height of Stairs 6   Ramp 6: Modified independent (Device)   Curb 6: Modified independent (Device/increase time)     Exercises   Other Exercises  Pt performed walking lunges and side stepping with mini squat in parallel bars with supervision.  Initially pt used B UE for support on bars then progressed to single UE support.  She required verbal and demo cuing for sequening and to incr step length and depth of lunge/squat to incr muscle activation and incr strength.             Balance Exercises - 08/27/16 1510      Balance  Exercises: Standing   Step Over Hurdles / Cones Pt performed forward stepping and side stepping over hurdles with verbal cuing for sequencing and foot placement.  Pt required supervision/CGA during activity.           PT Education - 08/27/16 1518    Education provided Yes   Education Details Pt advised to attempt gait outdoors without AD when comfortable and to gradually incr distance and time ambulating without AD.   Person(s) Educated Patient   Methods Explanation   Comprehension Verbalized understanding          PT Short Term Goals - 08/27/16 1523      PT SHORT TERM GOAL #1   Title Pt will be independent and verbaliz understanding of initial HEP to progress her functional status. (Target Date for all STGs: 08/30/16)   Baseline GOAL MET: 08/22/16   Time 4   Period Weeks   Status Achieved     PT SHORT TERM GOAL #2   Title Pt will improve Berg Balance score to >52/56 to decr fall risk.   Baseline GOAL PARTIALLY MET: 08/22/16 (52/56)   Time 4   Period Weeks   Status Partially Met     PT SHORT TERM GOAL #3   Title Pt will improve FGA score to >or = 23/30 to indicate decr fall risk.   Baseline 08/22/16 - 22/30   Time 4   Period Weeks   Status On-going     PT SHORT TERM GOAL #4   Title Pt will negotiate 8 stairs with mod I and no UE support to incr safety when entering/exiting her home.   Baseline GOAL MET: 08/22/16   Time 4   Period Weeks   Status Achieved     PT SHORT TERM GOAL #5   Title Pt will ambulate 576f with LRAD and supervision indoors over level surfaces, ramps, and curbs to incr safety when ambulating at home and in the community.   Baseline GOAL MET: 08/27/16   Time 4   Period Weeks   Status Achieved           PT Long Term Goals - 08/01/16 1659      PT LONG TERM GOAL #1   Title Pt will be independent and verbalize understanding of HEP and on-going fitness program to maintain progress made in therapy and improve overall health. (Target Date for  all LTGs: 09/27/16)    Time 8   Period Weeks   Status New     PT LONG TERM GOAL #2   Title Pt will improve Berg Balance score to 56/56 to indicate decr risk of falls.   Time 8   Period Weeks   Status New     PT LONG TERM GOAL #3   Title Pt will improve FGA score to > or = 27/30 to  indicate decr risk of falls.   Time 8   Period Weeks   Status New     PT LONG TERM GOAL #4   Title Pt will negotiate 12 stairs with mod I while carrying a simulated load of laundry to incr safety while performing ADLs at home and while working.   Time 8   Period Weeks   Status New     PT LONG TERM GOAL #5   Title Pt will ambulate >1062f with mod I and LRAD outdoors over unlevel surfaces, ramps, curbs, grass, and gravel to incr safety while ambulating in the community.   Time 8   Period Weeks   Status New               Plan - 08/27/16 1519    Clinical Impression Statement Pt continues to demonstrate progress during each session and is able to ambulate outdoors without AD and no evidence of LOB or toe catching.  She is able to negotiate stairs, obstacle, and unlevel surfaces of various types at a mod I level, however, she continues to display incr fear of falling when ambulating in the community, especially if ambulating without her SBQC.  She would continue to benefit from skilled PT to improve remaining deficits and to incr her confidence with ambualtion   Rehab Potential Good   Clinical Impairments Affecting Rehab Potential HTN   PT Frequency 1x / week   PT Duration 8 weeks   PT Treatment/Interventions ADLs/Self Care Home Management;Functional mobility training;Stair training;Gait training;DME Instruction;Therapeutic activities;Therapeutic exercise;Balance training;Neuromuscular re-education;Patient/family education;Manual techniques;Energy conservation   PT Next Visit Plan Assess remaining STGs, progress ambulation with obstacles and community obstacles, strengthen L LE and DF.   Consulted  and Agree with Plan of Care Patient      Patient will benefit from skilled therapeutic intervention in order to improve the following deficits and impairments:  Abnormal gait, Decreased activity tolerance, Decreased balance, Decreased mobility, Decreased knowledge of use of DME, Decreased endurance, Decreased strength, Impaired perceived functional ability, Improper body mechanics  Visit Diagnosis: Other abnormalities of gait and mobility  Unsteadiness on feet  Muscle weakness (generalized)     Problem List Patient Active Problem List   Diagnosis Date Noted  . Sleep disturbance 07/26/2016  . Overactive bladder 07/26/2016  . Tobacco abuse 07/21/2016  . Nodule of right lung 07/21/2016  . Acute [place] ([place]bro[place]) [place] 07/21/2016  . [place]ar [place] 07/20/2016  . Hypokalemia 07/20/2016  . HTN (hypertension) 07/20/2016    NKatherine Mantle SPT 08/27/2016, 3:25 PM  [facility] [address]Phone: [phone]  Fax:  [phone] Name: PICIS BUDREAUMRN: [id number] of Birth: [birth year]

## 2016-08-29 ENCOUNTER — Ambulatory Visit: Payer: Medicare HMO | Admitting: Physical Therapy

## 2016-09-03 ENCOUNTER — Ambulatory Visit: Payer: Medicare HMO | Admitting: Physical Therapy

## 2016-09-03 DIAGNOSIS — R2689 Other abnormalities of gait and mobility: Secondary | ICD-10-CM

## 2016-09-03 DIAGNOSIS — M6281 Muscle weakness (generalized): Secondary | ICD-10-CM | POA: Diagnosis not present

## 2016-09-03 DIAGNOSIS — R2681 Unsteadiness on feet: Secondary | ICD-10-CM | POA: Diagnosis not present

## 2016-09-03 NOTE — Patient Instructions (Addendum)
Calf Stretch    Place hands on wall at shoulder height. Keeping back leg straight, bend front leg, feet pointing forward, heels flat on floor. Lean forward slightly until stretch is felt in calf of back leg. Hold stretch _45__ seconds, breathing slowly in and out. Repeat stretch with other leg back. Do _3-4__ sessions per day on each leg.   HIP: Hamstrings - Short Sitting    Rest leg on raised surface. Keep knee straight. Lift chest. Hold _45__ seconds. _4__ reps per set, _1-2__ sets per day on each leg, _5-7__ days per week  Feet Heel-Toe "Tandem"    Walk along the length of the counter with hand close to countertop (use for support only if needed for balance):walk a straight line bringing one foot directly in front of the other along counter top and then walk a straight line backwards bringing one foot directly behind the other one.  Hold each position for 2 seconds. Repeat for 3 laps each way session. Do _1-2_ sessions per day.  Walking Head Turn    Standing close to your countertop, walk the length of the countertop while turning head side to side (2 steps with head to right, 2 steps with head to left). Touch wall if necessary to keep balance. Practice this for 3-4 minutes per day.  Feet Together (Compliant Surface) Head Motion - Eyes Closed    Stand with your back to a corner with a stable chair in front of you. Stand on 1 pillow with feet together. Close eyes and move head slowly: up and down 5 times; right to left 5 times. Repeat _1-2___ times per day.  Walking Program:  Continue walking for exercise for 25 minutes, 1time/day, 5days/week.  - Be sure to wear good walking shoes - Walk in a safe environment - Only progress to your tolerance, when the current time is easy, add 2 minutes at that time  - Use cane when walking outdoors or for longer times for now. If walking short times in home, you can walk without the cane.  Bring your [employer] shoes to our next  session :)

## 2016-09-03 NOTE — Therapy (Signed)
[facility] [address] [place], [place], [address] Phone: [phone]   Fax:  [phone]  Physical Therapy Treatment  Patient Details  Name: Kathleen Guerrero MRN: [id number] Date of Birth: Mar 13, 1946 Referring Provider: Antony Contras, MD  Encounter Date: 09/03/2016      PT End of Session - 09/03/16 1027    Visit Number 8   Number of Visits 17   Date for PT Re-Evaluation 09/30/16   Authorization Type [employer]na Medicare   Authorization Time Period G-Code every 10th visit   PT Start Time 0848   PT Stop Time 0930   PT Time Calculation (min) 42 min   Activity Tolerance Patient tolerated treatment well   Behavior During Therapy [facility] for tasks assessed/performed      Past Medical History:  Diagnosis Date  . Hypertension   . Stroke ([place])   . Tobacco use     Past Surgical History:  Procedure Laterality Date  . DERMOID CYST  EXCISION    . EP IMPLANTABLE DEVICE N/A 07/23/2016   Procedure: Loop Recorder Insertion;  Surgeon: Thompson Grayer, MD;  Location: [place] [facility];  Service: Cardiovascular;  Laterality: N/A;  . Intestinal obstruction    . TEE WITHOUT CARDIOVERSION N/A 07/23/2016   Procedure: TRANSESOPHAGEAL ECHOCARDIOGRAM (TEE);  Surgeon: Lelon Perla, MD;  Location: [facility] ENDOSCOPY;  Service: Cardiovascular;  Laterality: N/A;    There were no vitals filed for this visit.      Subjective Assessment - 09/03/16 0850    Subjective Pt arrived to this session without cane today. Reports she has been walking around without cane but "carrying it in my car," per patient.    Limitations Standing;Walking;House hold activities   Patient Stated Goals "I want to improve my balance and be stronger.  Want my L leg not to fell weak."   Currently in Pain? No/denies            [facility] PT Assessment - 09/03/16 0001      Standardized Balance Assessment   Standardized Balance Assessment Berg Balance Test     Berg Balance  Test   Sit to Stand Able to stand without using hands and stabilize independently   Standing Unsupported Able to stand safely 2 minutes   Sitting with Back Unsupported but Feet Supported on Floor or Stool Able to sit safely and securely 2 minutes   Stand to Sit Sits safely with minimal use of hands   Transfers Able to transfer safely, minor use of hands   Standing Unsupported with Eyes Closed Able to stand 10 seconds safely   Standing Ubsupported with Feet Together Able to place feet together independently and stand 1 minute safely   From Standing, Reach Forward with Outstretched Arm Can reach confidently >25 cm (10")   From Standing Position, Pick up Object from Floor Able to pick up shoe safely and easily   From Standing Position, Turn to Look Behind Over each Shoulder Looks behind from both sides and weight shifts well   Turn 360 Degrees Able to turn 360 degrees safely in 4 seconds or less   Standing Unsupported, Alternately Place Feet on Step/Stool Able to stand independently and safely and complete 8 steps in 20 seconds   Standing Unsupported, One Foot in Front Able to place foot tandem independently and hold 30 seconds   Standing on One Leg Able to lift leg independently and hold > 10 seconds   Total Score 56     Functional  Gait  Assessment   Gait assessed  Yes   Gait Level Surface Walks 20 ft in less than 5.5 sec, no assistive devices, good speed, no evidence for imbalance, normal gait pattern, deviates no more than 6 in outside of the 12 in walkway width.   Change in Gait Speed Able to smoothly change walking speed without loss of balance or gait deviation. Deviate no more than 6 in outside of the 12 in walkway width.   Gait with Horizontal Head Turns Performs head turns smoothly with slight change in gait velocity (eg, minor disruption to smooth gait path), deviates 6-10 in outside 12 in walkway width, or uses an assistive device.   Gait with Vertical Head Turns Performs head turns  with no change in gait. Deviates no more than 6 in outside 12 in walkway width.   Gait and Pivot Turn Pivot turns safely within 3 sec and stops quickly with no loss of balance.   Step Over Obstacle Is able to step over 2 stacked shoe boxes taped together (9 in total height) without changing gait speed. No evidence of imbalance.   Gait with Narrow Base of Support Ambulates 4-7 steps.   Gait with Eyes Closed Walks 20 ft, slow speed, abnormal gait pattern, evidence for imbalance, deviates 10-15 in outside 12 in walkway width. Requires more than 9 sec to ambulate 20 ft.   Ambulating Backwards Walks 20 ft, slow speed, abnormal gait pattern, evidence for imbalance, deviates 10-15 in outside 12 in walkway width.   Steps Alternating feet, no rail.   Total Score 23                          Balance Exercises - 09/03/16 1024      Balance Exercises: Standing   Standing Eyes Closed Narrow base of support (BOS);Head turns;Foam/compliant surface;5 reps;Other (comment)  1 pillow; horiz, vertical head turns 2 x5 reps each   Gait with Head Turns Forward;2 reps;Other (comment)  2 x50'   Tandem Gait Forward;Intermittent upper extremity support;Other (comment);4 reps  10' x4 reps           PT Education - 09/03/16 1025    Education provided Yes   Education Details Explained Berg and FGA findings, functional implications. Explained use of multi-sensory input for balance, emphasis on vestibular reliance based on findings this session. Progressed HEP; see Pt Instructions.    Person(s) Educated Patient   Methods Demonstration;Explanation;Verbal cues;Handout   Comprehension Verbalized understanding;Returned demonstration          PT Short Term Goals - 09/03/16 0903      PT SHORT TERM GOAL #1   Title Pt will be independent and verbaliz understanding of initial HEP to progress her functional status. (Target Date for all STGs: 08/30/16)   Baseline GOAL MET: 08/22/16   Time 4   Period  Weeks   Status Achieved     PT SHORT TERM GOAL #2   Title Pt will improve Berg Balance score to >52/56 to decr fall risk.   Baseline 10/31: Merrilee Jansky = 56/56   Time 4   Period Weeks   Status Achieved     PT SHORT TERM GOAL #3   Title Pt will improve FGA score to >or = 23/30 to indicate decr fall risk.   Baseline 10/31: FGA = 23/30   Time 4   Period Weeks   Status Achieved     PT SHORT TERM GOAL #4   Title Pt  will negotiate 8 stairs with mod I and no UE support to incr safety when entering/exiting her home.   Baseline GOAL MET: 08/22/16   Time 4   Period Weeks   Status Achieved     PT SHORT TERM GOAL #5   Title Pt will ambulate 552f with LRAD and supervision indoors over level surfaces, ramps, and curbs to incr safety when ambulating at home and in the community.   Baseline GOAL MET: 08/27/16   Time 4   Period Weeks   Status Achieved           PT Long Term Goals - 09/03/16 0911      PT LONG TERM GOAL #1   Title Pt will be independent and verbalize understanding of HEP and on-going fitness program to maintain progress made in therapy and improve overall health. (Target Date for all LTGs: 09/27/16)    Time 8   Period Weeks   Status New     PT LONG TERM GOAL #2   Title Pt will improve Berg Balance score to 56/56 to indicate decr risk of falls.   Baseline Met 10/31.   Time 8   Period Weeks   Status Achieved     PT LONG TERM GOAL #3   Title Pt will improve FGA score to > or = 27/30 to indicate decr risk of falls.   Time 8   Period Weeks   Status New     PT LONG TERM GOAL #4   Title Pt will negotiate 12 stairs with mod I while carrying a simulated load of laundry to incr safety while performing ADLs at home and while working.   Time 8   Period Weeks   Status New     PT LONG TERM GOAL #5   Title Pt will ambulate >10053fwith mod I and LRAD outdoors over unlevel surfaces, ramps, curbs, grass, and gravel to incr safety while ambulating in the community.   Time 8    Period Weeks   Status New               Plan - 09/03/16 1028    Clinical Impression Statement Pt has now met all STG's and 1 LTG.  Berg scored improved from 49/56 to 56/56 since PT evaluation. Pt scored 23/30 on FGA; findings suggest visual dependence and decreased stability/independence in situations requiring vestibular reliance. Modified HEP to address said impairments.    Rehab Potential Good   Clinical Impairments Affecting Rehab Potential HTN   PT Frequency 1x / week   PT Duration 8 weeks   PT Treatment/Interventions ADLs/Self Care Home Management;Functional mobility training;Stair training;Gait training;DME Instruction;Therapeutic activities;Therapeutic exercise;Balance training;Neuromuscular re-education;Patient/family education;Manual techniques;Energy conservation   PT Next Visit Plan Continue high level balance, dynamic gait training with emphasis on vestibular reliance, retro gait, dynamic SLS. Continue to progress HEP.   Consulted and Agree with Plan of Care Patient      Patient will benefit from skilled therapeutic intervention in order to improve the following deficits and impairments:  Abnormal gait, Decreased activity tolerance, Decreased balance, Decreased mobility, Decreased knowledge of use of DME, Decreased endurance, Decreased strength, Impaired perceived functional ability, Improper body mechanics  Visit Diagnosis: Other abnormalities of gait and mobility  Unsteadiness on feet  Muscle weakness (generalized)     Problem List Patient Active Problem List   Diagnosis Date Noted  . Sleep disturbance 07/26/2016  . Overactive bladder 07/26/2016  . Tobacco abuse 07/21/2016  . Nodule of right lung 07/21/2016  .  Acute CVA (cerebrovascular accident) ([place]) 07/21/2016  . Acute lacunar stroke ([place]) 07/20/2016  . Hypokalemia 07/20/2016  . HTN (hypertension) 07/20/2016    Billie Ruddy, PT, DPT [facility] [address]  [place], [place], [address] Phone: [phone]   Fax:  [phone] 09/03/16, 10:33 AM  Name: Kathleen Guerrero MRN: [id number] Date of Birth: 05/21/1946

## 2016-09-05 ENCOUNTER — Ambulatory Visit: Payer: Medicare HMO | Admitting: Physical Therapy

## 2016-09-06 ENCOUNTER — Ambulatory Visit (INDEPENDENT_AMBULATORY_CARE_PROVIDER_SITE_OTHER): Payer: Medicare HMO | Admitting: Internal Medicine

## 2016-09-06 ENCOUNTER — Encounter: Payer: Self-pay | Admitting: Internal Medicine

## 2016-09-06 VITALS — BP 164/98 | HR 104 | Ht 65.0 in | Wt 176.0 lb

## 2016-09-06 DIAGNOSIS — R911 Solitary pulmonary nodule: Secondary | ICD-10-CM

## 2016-09-06 DIAGNOSIS — I10 Essential (primary) hypertension: Secondary | ICD-10-CM | POA: Diagnosis not present

## 2016-09-06 NOTE — Progress Notes (Signed)
Spoke with pt and notified of results per Dr. Wert. Pt verbalized understanding and denied any questions. 

## 2016-09-06 NOTE — Progress Notes (Signed)
Subjective:     Patient ID: Kathleen Guerrero, female   DOB: [birth year],   MRN: [id number]  HPI   50 yowf quit smoking 07/2016 p admit for cva / left leg weakness resolved with PT but referred to pulmonary clinic 09/06/2016 by Dr   Leonie Man for incidental SPN   09/06/2016 1st [facility] office visit/ Wert   Chief Complaint  Patient presents with  . Pulmonary Consult    Referred by Dr Mauricio Po for eval of pulmonary nodule. The pt denies any respiratory co's today.   Not limited by breathing from desired activities    No obvious day to day or daytime variability or assoc chronic cough or cp or chest tightness, subjective wheeze or overt sinus or hb symptoms. No unusual exp hx or h/o childhood pna/ asthma or knowledge of premature birth.  Sleeping ok without nocturnal  or early am exacerbation  of respiratory  c/o's or need for noct saba. Also denies any obvious fluctuation of symptoms with weather or environmental changes or other aggravating or alleviating factors except as outlined above   Current Medications, Allergies, Complete Past Medical History, Past Surgical History, Family History, and Social History were reviewed in [employer] record.  ROS  The following are not active complaints unless bolded sore throat, dysphagia, dental problems, itching, sneezing,  nasal congestion or excess/ purulent secretions, ear ache,   fever, chills, sweats, unintended wt loss, classically pleuritic or exertional cp, hemoptysis,  orthopnea pnd or leg swelling, presyncope, palpitations, abdominal pain, anorexia, nausea, vomiting, diarrhea  or change in bowel or bladder habits, change in stools or urine, dysuria,hematuria,  rash, arthralgias, visual complaints, headache, numbness, weakness or ataxia or problems with walking or coordination 95% improved since cva ,  change in mood/affect or memory.        Review of Systems     Objective:   Physical Exam   amb wf nad   Wt  Readings from Last 3 Encounters:  09/06/16 176 lb (79.8 kg)  07/26/16 182 lb (82.6 kg)  07/22/16 192 lb 4.8 oz (87.2 kg)    Vital signs reviewed - Note on arrival 02 sats  96% on RA - note bp 164/98      HEENT: nl dentition, turbinates, and oropharynx. Nl external ear canals without cough reflex   NECK :  without JVD/Nodes/TM/ nl carotid upstrokes bilaterally   LUNGS: no acc muscle use,  Nl contour chest which is clear to A and P bilaterally without cough on insp or exp maneuvers   CV:  RRR  no s3 or murmur or increase in P2, no edema   ABD:  soft and nontender with nl inspiratory excursion in the supine position. No bruits or organomegaly, bowel sounds nl  MS:  Nl gait/ ext warm without deformities, calf tenderness, cyanosis or clubbing No obvious joint restrictions   SKIN: warm and dry without lesions    NEURO:  alert, approp, nl sensorium with  no motor deficits      I personally reviewed images and agree with radiology impression as follows:  CT  Chest    07/22/16 Somewhat irregularly marginated nodule of 13 x 9 x 8 mm within the periphery of the right upper lobe. No additional lung nodule is seen. Consider follow-up CT in the next 2 months to assess stability or resolution. If this nodule persists, then PET-CT may be helpful to assess for metabolic activity.     Assessment:

## 2016-09-06 NOTE — Patient Instructions (Addendum)
Please see patient coordinator before you leave today  to schedule  a non contrasted CT chest after Thanksgiving and I will call you with the next step   Late add:  bp elevation noted, rec avoid salt and self monitor with f/u with [facility]

## 2016-09-06 NOTE — Progress Notes (Signed)
Per MW- the spiro was normal

## 2016-09-07 NOTE — Assessment & Plan Note (Signed)
See CT chest 07/22/16  RUL spiculated s/p smoking cessation 07/2016 - Spirometry 09/06/2016  wnl - CT chest by 10/04/16 rec   Chances are that this represents a longstanding nodule that would have not been detected if not for the neck CT ordered for her CVA in September but could also be an early stage bronchogenic Ca given her smoking hx  CT results reviewed with pt >>> Too small for PET or bx, not suspicious enough for excisional bx > really only option for now is follow the Fleischner society guidelines as rec by radiology = 2 month f/u and then consider PET if no improved as does meet the size criteria and she is a surgical candidate for RULobectomy if needed  Discussed in detail all the  indications, usual  risks and alternatives  relative to the benefits with patient who agrees to proceed with conservative f/u as outlined    Total time devoted to counseling  = 35/53mreview case with pt discussion of options/alternatives/ personally creating written instructions  in presence of pt  then going over those specific  Instructions directly with the pt including how to use all of the meds but in particular covering each new medication in detail and the difference between the maintenance/automatic meds and the prns using an action plan format for the latter.

## 2016-09-07 NOTE — Assessment & Plan Note (Addendum)
Not dequate control on present rx, probably due to anxiety re pulmonary visit for lung nodule/  reviewed > Follow up per Primary Care planned  / avoid na rec in meantime

## 2016-09-10 ENCOUNTER — Ambulatory Visit: Payer: Medicare HMO | Attending: Family Medicine | Admitting: Physical Therapy

## 2016-09-10 DIAGNOSIS — R2681 Unsteadiness on feet: Secondary | ICD-10-CM

## 2016-09-10 DIAGNOSIS — R42 Dizziness and giddiness: Secondary | ICD-10-CM | POA: Insufficient documentation

## 2016-09-10 DIAGNOSIS — M6281 Muscle weakness (generalized): Secondary | ICD-10-CM | POA: Insufficient documentation

## 2016-09-10 DIAGNOSIS — R2689 Other abnormalities of gait and mobility: Secondary | ICD-10-CM

## 2016-09-10 NOTE — Patient Instructions (Signed)
Pen Push-up  1. Hold a pen on front of you at arm's length. The pen should be vertical, with the tip at the top.The pen should be directly in front of your face, with the tip just below eye level.  2. Move the pencil slowly toward your face (all the way to your nose) as you concentrate and focus on the point. Soon you'll notice that you see two pens rather than one OR the pen begins shaking. Stop.  3. Look away from the pen briefly to rest your eyes. Focus on something across the room for two or three seconds, and then look back at the pen point where you've stopped it close to your face. Look at the pen point carefully, and to try to focus so that the double vision Or shaking disappears and you only see one, still pen.  4. Move the pen back out to arm's length when you are able to rid yourself of double vision. If this takes more than a few seconds, look away and try again. Once you are able to accomplish it, move the pen back out to arm's length and complete the exercise again.  Try to perform this exercise 5-10 minutes per day.   Gaze Stabilization: Standing Feet Apart    Feet shoulder width apart, keeping eyes on target ("A") on wall __6-7__ feet away at eye-level, tilt head down 15-30 and move head side to side for _30_ seconds.  Do __2_ sessions per day.  Gaze Stabilization: Tip Card  1.Target must remain in focus, not blurry, and appear stationary while head is in motion. 2.Perform exercises with small head movements (45 to either side of midline). 3.Increase speed of head motion so long as target is in focus. 4.If you wear eyeglasses, be sure you can see target through lens (therapist will give specific instructions for bifocal / progressive lenses). 5.These exercises may provoke dizziness or nausea. Work through these symptoms. If too dizzy, slow head movement slightly. Rest between each exercise. 6.Exercises demand concentration; avoid distractions. 7.For safety, perform  standing exercises close to a counter, wall, corner, or next to someone.  Copyright  VHI. All rights reserved.   Feet Apart (Compliant Surface) Head Motion - Eyes Open    Stand with your back to a corner with a stable chair in front of you.   1. With eyes open, standing on 1 pillow, feet together, move head slowly: up and down 5 times; right to left 5 times.  2. With eyes closed, standing on  1 pillow, feet shoulder width apart, move head slowly: up and down 5 times; right to left 5 times.  Repeat __2__ times per day.

## 2016-09-10 NOTE — Therapy (Signed)
[facility] [address] [place], [place], [address] Phone: [phone]   Fax:  [phone]  Physical Therapy Treatment  Patient Details  Name: TENZIN PAVON MRN: [id number] Date of Birth: [birth year] Referring Provider: Antony Contras, MD  Encounter Date: 09/10/2016      PT End of Session - 09/10/16 1342    Visit Number 9   Number of Visits 17   Date for PT Re-Evaluation 09/30/16   Authorization Type [employer]   Authorization Time Period G-Code every 10th visit   PT Start Time 0850   PT Stop Time 0936   PT Time Calculation (min) 46 min   Activity Tolerance Patient tolerated treatment well   Behavior During Therapy [facility] for tasks assessed/performed      Past Medical History:  Diagnosis Date  . Hypertension   . Stroke ([place])   . Tobacco use     Past Surgical History:  Procedure Laterality Date  . DERMOID CYST  EXCISION    . EP IMPLANTABLE DEVICE N/A 07/23/2016   Procedure: Loop Recorder Insertion;  Surgeon: Thompson Grayer, MD;  Location: [facility];  Service: Cardiovascular;  Laterality: N/A;  . Intestinal obstruction    . TEE WITHOUT CARDIOVERSION N/A 07/23/2016   Procedure: TRANSESOPHAGEAL ECHOCARDIOGRAM (TEE);  Surgeon: Lelon Perla, MD;  Location: [facility] ENDOSCOPY;  Service: Cardiovascular;  Laterality: N/A;    There were no vitals filed for this visit.      Subjective Assessment - 09/10/16 0854    Subjective "You know, I get a little dizzy when I turn quickly."   Pt reports having gotten great news that lung mass is likely benign but pulmonologist will monitor for growth to be sure.   Patient Stated Goals "I want to improve my balance and be stronger.  Want my L leg not to fell weak."   Currently in Pain? No/denies                Vestibular Assessment - 09/10/16 0001      Symptom Behavior   Type of Dizziness Imbalance   Frequency of Dizziness multiple times per day   Duration of  Dizziness seconds   Aggravating Factors Turning body quickly;Turning head quickly   Relieving Factors Head stationary     Occulomotor Exam   Occulomotor Alignment Normal  grossly   Spontaneous Absent   Gaze-induced Absent   Smooth Pursuits Comment  intermittently saccadic in L eye only   Saccades Comment;Slow  appears WNL but "feels funny" to L   Comment (+) and symptomatic B Head Thrust Test. Convergence abnormal. Saccades hypometric vertically (inferior to midline more pronounced).     Vestibulo-Occular Reflex   VOR 1 Head Only (x 1 viewing) Symptomatic and increased difficulty maintaining gaze on target with horizontal x1 viewing. No symptoms, no difficulty with vertical x1 viewing.                 [facility] Adult PT Treatment/Exercise - 09/10/16 0001      Neuro Re-ed    Neuro Re-ed Details  In seated, pt performed convergence exercise x5 reps with cueing from this PT for technique.         Vestibular Treatment/Exercise - 09/10/16 0001      Vestibular Treatment/Exercise   Vestibular Treatment Provided Gaze   Gaze Exercises X1 Viewing Horizontal     X1 Viewing Horizontal   Foot Position standing; feet shoulder width apart  7' away from visual target due to  impaired convergence   Reps 3   Comments 3 x30-second trials with cueing for technique            Balance Exercises - 09/10/16 0941      Balance Exercises: Standing   Standing Eyes Opened Narrow base of support (BOS);Head turns;Foam/compliant surface;Other (comment)  1 pillow; horiz, vertical head turns x5 each   Standing Eyes Closed Wide (BOA);Head turns;Foam/compliant surface;Other reps (comment)  1 pillow; horiz, vertical head turns x5 each           PT Education - 09/10/16 0941    Education provided Yes   Education Details Vestibular HEP initiated based on findings during this session. See Pt Instructions for details.    Person(s) Educated Patient   Methods  Explanation;Demonstration;Handout;Verbal cues;Tactile cues   Comprehension Verbalized understanding;Returned demonstration          PT Short Term Goals - 09/03/16 0903      PT SHORT TERM GOAL #1   Title Pt will be independent and verbaliz understanding of initial HEP to progress her functional status. (Target Date for all STGs: 08/30/16)   Baseline GOAL MET: 08/22/16   Time 4   Period Weeks   Status Achieved     PT SHORT TERM GOAL #2   Title Pt will improve Berg Balance score to >52/56 to decr fall risk.   Baseline 10/31: Merrilee Jansky = 56/56   Time 4   Period Weeks   Status Achieved     PT SHORT TERM GOAL #3   Title Pt will improve FGA score to >or = 23/30 to indicate decr fall risk.   Baseline 10/31: FGA = 23/30   Time 4   Period Weeks   Status Achieved     PT SHORT TERM GOAL #4   Title Pt will negotiate 8 stairs with mod I and no UE support to incr safety when entering/exiting her home.   Baseline GOAL MET: 08/22/16   Time 4   Period Weeks   Status Achieved     PT SHORT TERM GOAL #5   Title Pt will ambulate 573f with LRAD and supervision indoors over level surfaces, ramps, and curbs to incr safety when ambulating at home and in the community.   Baseline GOAL MET: 08/27/16   Time 4   Period Weeks   Status Achieved           PT Long Term Goals - 09/10/16 0856      PT LONG TERM GOAL #1   Title Pt will be independent and verbalize understanding of HEP and on-going fitness program to maintain progress made in therapy and improve overall health. (Target Date for all LTGs: 09/27/16)    Baseline 11/7:  Pt is currently going to the gym 2x/week and plans to progress to 3x/week.   Time 8   Period Weeks   Status Achieved     PT LONG TERM GOAL #2   Title Pt will improve Berg Balance score to 56/56 to indicate decr risk of falls.   Baseline Met 10/31.   Time 8   Period Weeks   Status Achieved     PT LONG TERM GOAL #3   Title Pt will improve FGA score to > or = 27/30 to  indicate decr risk of falls.   Time 8   Period Weeks   Status On-going     PT LONG TERM GOAL #4   Title Pt will negotiate 12 stairs with mod I while carrying a simulated  load of laundry to incr safety while performing ADLs at home and while working.   Time 8   Period Weeks   Status On-going     PT LONG TERM GOAL #5   Title Pt will ambulate >107f with mod I and LRAD outdoors over unlevel surfaces, ramps, curbs, grass, and gravel to incr safety while ambulating in the community.   Time 8   Period Weeks   Status New               Plan - 09/10/16 1343    Clinical Impression Statement Pt arrived to session with report of continued imbalance and dizziness with quick head/body turning during mobility. Oculomotor assessment reveals impaired convergence (altough oculomotor alignment appears normal at rest and pt reports no diplopia); slow and hypometric vertical saccades; (+) and symptomatic B Head Thrust Test; and significant difficulty, symptoms with horizontal x1 viewing. Therefore, modified HEP to vestibular HEP for pt to perform for next week to assess if dizziness/imbalance improves. Pt tolerated interventions well.    Rehab Potential Good   Clinical Impairments Affecting Rehab Potential HTN   PT Frequency 1x / week   PT Duration 8 weeks   PT Treatment/Interventions ADLs/Self Care Home Management;Functional mobility training;Stair training;Gait training;DME Instruction;Therapeutic activities;Therapeutic exercise;Balance training;Neuromuscular re-education;Patient/family education;Manual techniques;Energy conservation   PT Next Visit Plan Ask about dizziness with head/body turns and vestibular HEP. Consider DC from PT, if all goals met. Teach pt to self-progress HEP.   Consulted and Agree with Plan of Care Patient      Patient will benefit from skilled therapeutic intervention in order to improve the following deficits and impairments:  Abnormal gait, Decreased activity tolerance,  Decreased balance, Decreased mobility, Decreased knowledge of use of DME, Decreased endurance, Decreased strength, Impaired perceived functional ability, Improper body mechanics  Visit Diagnosis: Other abnormalities of gait and mobility  Unsteadiness on feet  Muscle weakness (generalized)  Dizziness and giddiness     Problem List Patient Active Problem List   Diagnosis Date Noted  . Sleep disturbance 07/26/2016  . Overactive bladder 07/26/2016  . Tobacco abuse 07/21/2016  . Solitary pulmonary nodule 07/21/2016  . Acute [place] 07/21/2016  . Acute [place]unar stroke [place] 07/20/2016  . Hypokalemia 07/20/2016  . Essential hypertension 07/20/2016    BBillie Ruddy PT, DPT [facility] [address]Phone: [phone]  Fax:  [phone]/07/17, 1:47 PM  Name: PTHOMASENA VANDENHEUVELMRN: [id number] of Birth: [birth year]

## 2016-09-12 ENCOUNTER — Ambulatory Visit (INDEPENDENT_AMBULATORY_CARE_PROVIDER_SITE_OTHER): Payer: Medicare HMO | Admitting: Family

## 2016-09-12 ENCOUNTER — Other Ambulatory Visit: Payer: Medicare HMO

## 2016-09-12 ENCOUNTER — Ambulatory Visit: Payer: Medicare HMO | Admitting: Physical Therapy

## 2016-09-12 ENCOUNTER — Encounter: Payer: Self-pay | Admitting: Family

## 2016-09-12 DIAGNOSIS — R197 Diarrhea, unspecified: Secondary | ICD-10-CM

## 2016-09-12 NOTE — Assessment & Plan Note (Signed)
Symptoms are consistent with possible viral origin given improvement. Symptoms are concerning for possible IBS-D given cramping and frequent bowel movements for most of her life. Treat conservatively with OTC medications. GI Pathogen panel order placed if symptoms do not improve. Continue to monitor.

## 2016-09-12 NOTE — Patient Instructions (Addendum)
Thank you for choosing [facility].  SUMMARY AND INSTRUCTIONS:  Medication:  Continue to take your medication as prescribed.   Your prescription(s) have been submitted to your pharmacy or been printed and provided for you. Please take as directed and contact our office if you believe you are having problem(s) with the medication(s) or have any questions.  Labs:  Please stop by the lab on the lower level of the building for your blood work. Your results will be released to [place] (or called to you) after review, usually within 72 hours after test completion. If any changes need to be made, you will be notified at that same time.  1.) The lab is open from 7:30am to 5:30 pm Monday-Friday 2.) No appointment is necessary 3.) Fasting (if needed) is 6-8 hours after food and drink; black coffee and water are okay   Follow up:  If your symptoms worsen or fail to improve, please contact our office for further instruction, or in case of emergency go directly to the emergency room at the closest medical facility.    Diarrhea Diarrhea is frequent loose and watery bowel movements. It can cause you to feel weak and dehydrated. Dehydration can cause you to become tired and thirsty, have a dry mouth, and have decreased urination that often is dark yellow. Diarrhea is a sign of another problem, most often an infection that will not last long. In most cases, diarrhea typically lasts 2-3 days. However, it can last longer if it is a sign of something more serious. It is important to treat your diarrhea as directed by your caregiver to lessen or prevent future episodes of diarrhea. CAUSES  Some common causes include:  Gastrointestinal infections caused by viruses, bacteria, or parasites.  Food poisoning or food allergies.  Certain medicines, such as antibiotics, chemotherapy, and laxatives.  Artificial sweeteners and fructose.  Digestive disorders. HOME CARE INSTRUCTIONS  Ensure adequate  fluid intake (hydration): Have 1 cup (8 oz) of fluid for each diarrhea episode. Avoid fluids that contain simple sugars or sports drinks, fruit juices, whole milk products, and sodas. Your urine should be clear or pale yellow if you are drinking enough fluids. Hydrate with an oral rehydration solution that you can purchase at pharmacies, retail stores, and online. You can prepare an oral rehydration solution at home by mixing the following ingredients together:   - tsp table salt.   tsp baking soda.   tsp salt substitute containing potassium chloride.  1  tablespoons sugar.  1 L (34 oz) of water.  Certain foods and beverages may increase the speed at which food moves through the gastrointestinal (GI) tract. These foods and beverages should be avoided and include:  Caffeinated and alcoholic beverages.  High-fiber foods, such as raw fruits and vegetables, nuts, seeds, and whole grain breads and cereals.  Foods and beverages sweetened with sugar alcohols, such as xylitol, sorbitol, and mannitol.  Some foods may be well tolerated and may help thicken stool including:  Starchy foods, such as rice, toast, pasta, low-sugar cereal, oatmeal, grits, baked potatoes, crackers, and bagels.  Bananas.  Applesauce.  Add probiotic-rich foods to help increase healthy bacteria in the GI tract, such as yogurt and fermented milk products.  Wash your hands well after each diarrhea episode.  Only take over-the-counter or prescription medicines as directed by your caregiver.  Take a warm bath to relieve any burning or pain from frequent diarrhea episodes. SEEK IMMEDIATE MEDICAL CARE IF:   You are unable to keep  fluids down.  You have persistent vomiting.  You have blood in your stool, or your stools are black and tarry.  You do not urinate in 6-8 hours, or there is only a small amount of very dark urine.  You have abdominal pain that increases or localizes.  You have weakness, dizziness,  confusion, or light-headedness.  You have a severe headache.  Your diarrhea gets worse or does not get better.  You have a fever or persistent symptoms for more than 2-3 days.  You have a fever and your symptoms suddenly get worse. MAKE SURE YOU:   Understand these instructions.  Will watch your condition.  Will get help right away if you are not doing well or get worse.   This information is not intended to replace advice given to you by your health care provider. Make sure you discuss any questions you have with your health care provider.   Document Released: 10/11/2002 Document Revised: 11/11/2014 Document Reviewed: 06/28/2012 Elsevier Interactive Patient Education [employer].

## 2016-09-12 NOTE — Progress Notes (Signed)
Subjective:    Patient ID: Kathleen Guerrero, female    DOB: February 07, 1946, 70 y.o.   MRN: [id number]  Chief Complaint  Patient presents with  . Diarrhea    x1 week has had diarrhea, states that it is mainly at night, had a really dark stool yesterday but today it was back to normal    HPI:  Kathleen Guerrero is a 70 y.o. female who  has a past medical history of Hypertension; Stroke ([place]); and Tobacco use. and presents today for an acute office visit.   This is a new problem. Associated symptom of diarrhea has been going on for about 1 week. Stool is described as really dark on occasion but has return to normal color. Timing of the symptoms is generally worse at night. There has been occasional abdominal pain described as cramping. Denies taking iron or Pepto Bismol. Modifying factors include Immodium. Normal bowel pattern is 2-3 time per day which is now increased to 6-7 times per day. Course of the symptoms have improved and overall feeling better.   No Known Allergies    Outpatient Medications Prior to Visit  Medication Sig Dispense Refill  . aspirin 325 MG tablet Take 1 tablet (325 mg total) by mouth daily. 30 tablet 2  . atorvastatin (LIPITOR) 40 MG tablet Take 1 tablet (40 mg total) by mouth daily at 6 PM. 30 tablet 2  . nicotine polacrilex ([employer] NICOTINE) 2 MG lozenge Take 2 mg by mouth as needed for smoking cessation.    Marland Kitchen oxybutynin (DITROPAN-XL) 5 MG 24 hr tablet TAKE 1 TABLET BY MOUTH AT BEDTIME 30 tablet 0   No facility-administered medications prior to visit.       Past Surgical History:  Procedure Laterality Date  . DERMOID CYST  EXCISION    . EP IMPLANTABLE DEVICE N/A 07/23/2016   Procedure: Loop Recorder Insertion;  Surgeon: Thompson Grayer, MD;  Location: [facility];  Service: Cardiovascular;  Laterality: N/A;  . Intestinal obstruction    . TEE WITHOUT CARDIOVERSION N/A 07/23/2016   Procedure: TRANSESOPHAGEAL ECHOCARDIOGRAM (TEE);  Surgeon: Lelon Perla, MD;   Location: [facility] ENDOSCOPY;  Service: Cardiovascular;  Laterality: N/A;      Past Medical History:  Diagnosis Date  . Hypertension   . Stroke ([place])   . Tobacco use       Review of Systems  Constitutional: Negative for chills and fever.  Gastrointestinal: Positive for abdominal pain and diarrhea. Negative for blood in stool, constipation, nausea, rectal pain and vomiting.      Objective:    BP (!) 148/94 (BP Location: Left Arm, Patient Position: Sitting, Cuff Size: Normal)   Pulse 75   Temp 99.2 F (37.3 C) (Oral)   Resp 16   Ht '5\' 5"'$  (1.651 m)   Wt 177 lb (80.3 kg)   SpO2 97%   BMI 29.45 kg/m  Nursing note and vital signs reviewed.  Physical Exam  Constitutional: She is oriented to person, place, and time. She appears well-developed and well-nourished. No distress.  Cardiovascular: Normal rate, regular rhythm, normal heart sounds and intact distal pulses.   Pulmonary/Chest: Effort normal and breath sounds normal.  Abdominal: Soft. Normal appearance. There is no hepatosplenomegaly. There is no tenderness. There is no rigidity, no rebound, no guarding, no tenderness at McBurney's point and negative Murphy's sign.  Neurological: She is alert and oriented to person, place, and time.  Skin: Skin is warm and dry.  Psychiatric: She has a normal  mood and affect. Her behavior is normal. Judgment and thought content normal.       Assessment & Plan:   Problem List Items Addressed This Visit      Other   Diarrhea    Symptoms are consistent with possible viral origin given improvement. Symptoms are concerning for possible IBS-D given cramping and frequent bowel movements for most of her life. Treat conservatively with OTC medications. GI Pathogen panel order placed if symptoms do not improve. Continue to monitor.       Relevant Orders   Gastrointestinal Pathogen Panel PCR       I am having Ms. Culverhouse maintain her aspirin, atorvastatin, oxybutynin, and nicotine  polacrilex.   Follow-up: Return if symptoms worsen or fail to improve.  Mauricio Po, FNP

## 2016-09-13 ENCOUNTER — Ambulatory Visit: Payer: Medicare HMO | Admitting: Family

## 2016-09-17 ENCOUNTER — Ambulatory Visit: Payer: Medicare HMO | Admitting: Physical Therapy

## 2016-09-17 DIAGNOSIS — R2689 Other abnormalities of gait and mobility: Secondary | ICD-10-CM | POA: Diagnosis not present

## 2016-09-17 DIAGNOSIS — R42 Dizziness and giddiness: Secondary | ICD-10-CM

## 2016-09-17 DIAGNOSIS — R2681 Unsteadiness on feet: Secondary | ICD-10-CM

## 2016-09-17 DIAGNOSIS — M6281 Muscle weakness (generalized): Secondary | ICD-10-CM

## 2016-09-17 NOTE — Therapy (Signed)
[facility] [address] [place], [place], [address] Phone: [phone]   Fax:  [phone]  Physical Therapy Treatment  Patient Details  Name: Kathleen Guerrero MRN: [id number] Date of Birth: 12/18/1945 Referring Provider: Antony Contras, MD  Encounter Date: 09/17/2016      PT End of Session - 09/17/16 1027    Visit Number 10   Number of Visits 17   Date for PT Re-Evaluation 09/30/16   Authorization Type Aetna Medicare   Authorization Time Period G-Code every 10th visit   PT Start Time [phone]   PT Stop Time 0930   PT Time Calculation (min) 41 min   Activity Tolerance Patient tolerated treatment well   Behavior During Therapy [facility] for tasks assessed/performed      Past Medical History:  Diagnosis Date  . Hypertension   . Stroke ([place])   . Tobacco use     Past Surgical History:  Procedure Laterality Date  . DERMOID CYST  EXCISION    . EP IMPLANTABLE DEVICE N/A 07/23/2016   Procedure: Loop Recorder Insertion;  Surgeon: Thompson Grayer, MD;  Location: [place] [facility];  Service: Cardiovascular;  Laterality: N/A;  . Intestinal obstruction    . TEE WITHOUT CARDIOVERSION N/A 07/23/2016   Procedure: TRANSESOPHAGEAL ECHOCARDIOGRAM (TEE);  Surgeon: Lelon Perla, MD;  Location: [facility] ENDOSCOPY;  Service: Cardiovascular;  Laterality: N/A;    There were no vitals filed for this visit.      Subjective Assessment - 09/17/16 0852    Subjective "I brought my Danskos (shoes) today. Maybe we can try walking in them." Pt reports no falls. Does not perceive any significant improvement in imbalance with turning since starting home exercises. Has not been performing gaze or corner balance as much as convergence exercise.    Limitations Standing;Walking;House hold activities   Patient Stated Goals "I want to improve my balance and be stronger.  Want my L leg not to fell weak."   Currently in Pain? No/denies            [facility] PT  Assessment - 09/17/16 0001      Functional Gait  Assessment   Gait assessed  Yes   Gait Level Surface Walks 20 ft in less than 5.5 sec, no assistive devices, good speed, no evidence for imbalance, normal gait pattern, deviates no more than 6 in outside of the 12 in walkway width.   Change in Gait Speed Able to smoothly change walking speed without loss of balance or gait deviation. Deviate no more than 6 in outside of the 12 in walkway width.   Gait with Horizontal Head Turns Performs head turns smoothly with slight change in gait velocity (eg, minor disruption to smooth gait path), deviates 6-10 in outside 12 in walkway width, or uses an assistive device.   Gait with Vertical Head Turns Performs head turns with no change in gait. Deviates no more than 6 in outside 12 in walkway width.   Gait and Pivot Turn Pivot turns safely within 3 sec and stops quickly with no loss of balance.   Step Over Obstacle Is able to step over 2 stacked shoe boxes taped together (9 in total height) without changing gait speed. No evidence of imbalance.   Gait with Narrow Base of Support Ambulates 4-7 steps.   Gait with Eyes Closed Walks 20 ft, slow speed, abnormal gait pattern, evidence for imbalance, deviates 10-15 in outside 12 in walkway width. Requires more than 9 sec to  ambulate 20 ft.  10.14 seconds   Ambulating Backwards Walks 20 ft, uses assistive device, slower speed, mild gait deviations, deviates 6-10 in outside 12 in walkway width.   Steps Alternating feet, no rail.   Total Score 24                     [facility] Adult PT Treatment/Exercise - 09/17/16 0001      Transfers   Transfers Sit to Stand;Stand to Sit   Sit to Stand 7: Independent   Stand to Sit 7: Independent     Ambulation/Gait   Ambulation/Gait Yes   Ambulation/Gait Assistance 5: Supervision   Ambulation/Gait Assistance Details wearing Danskos; cueing for decreased gait speed, but no overt LOB.   Ambulation Distance (Feet) 230  Feet   Assistive device None   Gait Pattern Step-through pattern;Trunk flexed;Decreased arm swing - right;Decreased arm swing - left  downward gaze   Ambulation Surface Level;Indoor   Stairs Yes  not wearing Danskos for stair negotiation   Stairs Assistance 6: Modified independent (Device/Increase time)   Stair Management Technique No rails;Alternating pattern;Forwards   Number of Stairs 4   Height of Stairs 6     Neuro Re-ed    Neuro Re-ed Details  Seated: reviewed convergence exercise; pt required 50% verbal/demo cueing for proper technique.         Vestibular Treatment/Exercise - 09/17/16 0001      Vestibular Treatment/Exercise   Vestibular Treatment Provided Gaze     X1 Viewing Horizontal   Foot Position --  3' away (as target is in focus at 3' away today)   Reps 2   Comments 2 x30-sec trials with cueing for technique            Balance Exercises - 09/17/16 1025      Balance Exercises: Standing   Standing Eyes Opened Narrow base of support (BOS);Head turns;Foam/compliant surface;Other reps (comment)  1 pillow; horiz, vertical head turns x10 each   Standing Eyes Closed Narrow base of support (BOS);Head turns;Foam/compliant surface;Other reps (comment)  1 pillow; horiz, vertical head turns x10 each   Gait with Head Turns Forward;Intermittent upper extremity support;3 reps;Other (comment)  3 x30' with intermittent UE support at wall (hallway)   Tandem Gait Forward;Intermittent upper extremity support;3 reps;Other (comment)  3 x10'; cueing to decrease speed, increase control           PT Education - 09/17/16 1020    Education provided Yes   Education Details Explained FGA findings, progress, functioanal implications. HEP modified. Recommended pt try ambulating in Dansko shoes in home only for 5-10 minutes at a time and let PT know how this goes at next session.   Person(s) Educated Patient   Methods Explanation;Demonstration;Verbal cues;Handout   Comprehension  Verbalized understanding;Returned demonstration          PT Short Term Goals - 09/03/16 0903      PT SHORT TERM GOAL #1   Title Pt will be independent and verbaliz understanding of initial HEP to progress her functional status. (Target Date for all STGs: 08/30/16)   Baseline GOAL MET: 08/22/16   Time 4   Period Weeks   Status Achieved     PT SHORT TERM GOAL #2   Title Pt will improve Berg Balance score to >52/56 to decr fall risk.   Baseline 10/31: Berg = 56/56   Time 4   Period Weeks   Status Achieved     PT SHORT TERM GOAL #3  Title Pt will improve FGA score to >or = 23/30 to indicate decr fall risk.   Baseline 10/31: FGA = 23/30   Time 4   Period Weeks   Status Achieved     PT SHORT TERM GOAL #4   Title Pt will negotiate 8 stairs with mod I and no UE support to incr safety when entering/exiting her home.   Baseline GOAL MET: 08/22/16   Time 4   Period Weeks   Status Achieved     PT SHORT TERM GOAL #5   Title Pt will ambulate 573f with LRAD and supervision indoors over level surfaces, ramps, and curbs to incr safety when ambulating at home and in the community.   Baseline GOAL MET: 08/27/16   Time 4   Period Weeks   Status Achieved           PT Long Term Goals - 09/10/16 0856      PT LONG TERM GOAL #1   Title Pt will be independent and verbalize understanding of HEP and on-going fitness program to maintain progress made in therapy and improve overall health. (Target Date for all LTGs: 09/27/16)    Baseline 11/7:  Pt is currently going to the gym 2x/week and plans to progress to 3x/week.   Time 8   Period Weeks   Status Achieved     PT LONG TERM GOAL #2   Title Pt will improve Berg Balance score to 56/56 to indicate decr risk of falls.   Baseline Met 10/31.   Time 8   Period Weeks   Status Achieved     PT LONG TERM GOAL #3   Title Pt will improve FGA score to > or = 27/30 to indicate decr risk of falls.   Time 8   Period Weeks   Status On-going      PT LONG TERM GOAL #4   Title Pt will negotiate 12 stairs with mod I while carrying a simulated load of laundry to incr safety while performing ADLs at home and while working.   Time 8   Period Weeks   Status On-going     PT LONG TERM GOAL #5   Title Pt will ambulate >10052fwith mod I and LRAD outdoors over unlevel surfaces, ramps, curbs, grass, and gravel to incr safety while ambulating in the community.   Time 8   Period Weeks   Status New               Plan - 09/17/16 1028    Clinical Impression Statement Session focused on reviewing/consolidating vestibular and balance HEP as well as assessing dynamic gait stability. FGA score improved from 23 to 24/30 since last assessed approx. 2 weeks ago. Pt continues to demo decreased independence with tandem gait and during gait with head turns. Added exercises to HEP to address said impairments. Tentatively plan to DC from PT at next session; pt verbalized understanding and in full agreement.   Rehab Potential Good   Clinical Impairments Affecting Rehab Potential HTN   PT Frequency 1x / week   PT Duration 8 weeks   PT Treatment/Interventions ADLs/Self Care Home Management;Functional mobility training;Stair training;Gait training;DME Instruction;Therapeutic activities;Therapeutic exercise;Balance training;Neuromuscular re-education;Patient/family education;Manual techniques;Energy conservation   PT Next Visit Plan Check goals and DC from PT. FOTO, GCODES.   Consulted and Agree with Plan of Care Patient      Patient will benefit from skilled therapeutic intervention in order to improve the following deficits and impairments:  Abnormal gait,  Decreased activity tolerance, Decreased balance, Decreased mobility, Decreased knowledge of use of DME, Decreased endurance, Decreased strength, Impaired perceived functional ability, Improper body mechanics  Visit Diagnosis: Other abnormalities of gait and mobility  Unsteadiness on  feet  Muscle weakness (generalized)  Dizziness and giddiness       G-Codes - 09-20-16 0903    Functional Assessment Tool Used FGA = 24/30   Functional Limitation Mobility: Walking and moving around   Mobility: Walking and Moving Around Current Status [phone]) At least 20 percent but less than 40 percent impaired, limited or restricted   Mobility: Walking and Moving Around Goal Status [phone]) At least 1 percent but less than 20 percent impaired, limited or restricted      Problem List Patient Active Problem List   Diagnosis Date Noted  . Diarrhea 09/12/2016  . Sleep disturbance 07/26/2016  . Overactive bladder 07/26/2016  . Tobacco abuse 07/21/2016  . Solitary pulmonary nodule 07/21/2016  . Acute CVA (cerebrovascular accident) ([place]) 07/21/2016  . Acute lacunar stroke ([place]) 07/20/2016  . Hypokalemia 07/20/2016  . Essential hypertension 07/20/2016    Name: TAIYA NUTTING MRN: [id number] Date of Birth: 08/06/1946   Physical Therapy Progress Note  Dates of Reporting Period: 08/01/16 to 09/20/16  Objective Reports of Subjective Statement: See above.  Objective Measurements: See above. FGA = 24/30  Goal Update: See above.  Plan: Continue per POC. Tentatively plan to DC after next visit.  Reason Skilled Services are Required: To maximize stability/independence with functional mobility.   Billie Ruddy, PT, DPT [facility] [address] [place], [place], [address] Phone: [phone]   Fax:  [phone] 2016/09/20, 10:32 AM

## 2016-09-17 NOTE — Patient Instructions (Signed)
Gaze Stabilization: Standing Feet Apart    Feet shoulder width apart, keeping eyes on target ("A") on wall __3__ feet away at eye-level, tilt head down slightly and move head side to side for _30_ seconds.  Do __2_ sessions per day. 1.Target must remain in focus, not blurry, and appear stationary while head is in motion. 2.Perform exercises with small head movements (45 to either side of midline). 3.Increase speed of head motion so long as target is in focus.  Feet Heel-Toe "Tandem"    Walk the length of your countertop, bringing one foot directly in front of the other. Hold each position for 2 seconds. Repeat for _2-3_ minutes, _1-2_ times per day.  Walking Head Turn    Standing close to a wall, walk while turning head side to side. Touch wall if necessary to keep balance. Practice this exercise for 2-3 minutes, __1-2__ times per day.   [name], do this last exercise when you can; but don't worry as much about doing this one every day. Pen Push-up  1. Hold a pen on front of you at arm's length. The pen should be vertical, with the tip at the top.The pen should be directly in front of your face, with the tip just below eye level.  2. Move the pencil slowly toward your face (all the way to your nose) as you concentrate and focus on the point. Soon you'll notice that you see two pens rather than one OR the pen begins shaking. Stop.  3. Look away from the pen briefly to rest your eyes. Focus on something across the room for two or three seconds, and then look back at the pen point where you've stopped it close to your face. Look at the pen point carefully, and to try to focus so that the double vision Or shaking disappears and you only see one, still pen.  4. Move the pen back out to arm's length when you are able to rid yourself of double vision. If this takes more than a few seconds, look away and try again. Once you are able to accomplish it, move the pen back out to arm's length  and complete the exercise again.  Try to perform this exercise 3-5 minutes per day, as able.

## 2016-09-19 ENCOUNTER — Ambulatory Visit: Payer: Medicare HMO | Admitting: Physical Therapy

## 2016-09-21 LAB — CUP PACEART REMOTE DEVICE CHECK
Date Time Interrogation Session: 20171019203708
Implantable Pulse Generator Implant Date: 20170919

## 2016-09-21 NOTE — Progress Notes (Signed)
Carelink summary report received. Battery status OK. Normal device function. No new symptom episodes, tachy episodes, brady, or pause episodes. No new AF episodes. Monthly summary reports and ROV/PRN 

## 2016-09-23 ENCOUNTER — Telehealth: Payer: Self-pay | Admitting: Internal Medicine

## 2016-09-23 ENCOUNTER — Ambulatory Visit: Payer: Medicare HMO | Admitting: Physical Therapy

## 2016-09-23 ENCOUNTER — Other Ambulatory Visit: Payer: Self-pay | Admitting: Family

## 2016-09-23 ENCOUNTER — Ambulatory Visit (INDEPENDENT_AMBULATORY_CARE_PROVIDER_SITE_OTHER): Payer: Medicare HMO | Admitting: *Deleted

## 2016-09-23 DIAGNOSIS — I639 Cerebral infarction, unspecified: Secondary | ICD-10-CM

## 2016-09-23 DIAGNOSIS — R2689 Other abnormalities of gait and mobility: Secondary | ICD-10-CM | POA: Diagnosis not present

## 2016-09-23 NOTE — Patient Instructions (Signed)
AROM: Neck Rotation    Turn head slowly to look over one shoulder, then the other. Hold each position __5__ seconds. Repeat _10__ times per set. Do __2__ sessions per day.

## 2016-09-23 NOTE — Therapy (Signed)
[facility] [address] [place], [place], [address] Phone: [phone]   Fax:  [phone]  Physical Therapy Treatment and Discharge Summary  Patient Details  Name: Kathleen Guerrero MRN: [id number] Date of Birth: 1946/01/22 Referring Provider: Antony Contras, MD  Encounter Date: 09/23/2016      PT End of Session - 09/23/16 1326    Visit Number 11   Number of Visits 17   Date for PT Re-Evaluation 09/30/16   Authorization Type Aetna Medicare   Authorization Time Period G-Code every 10th visit   PT Start Time [phone]   PT Stop Time 1009  Session ended early due to all goals met, patient discharged   PT Time Calculation (min) 35 min   Activity Tolerance Patient tolerated treatment well   Behavior During Therapy [facility] for tasks assessed/performed      Past Medical History:  Diagnosis Date  . Hypertension   . Stroke ([place])   . Tobacco use     Past Surgical History:  Procedure Laterality Date  . DERMOID CYST  EXCISION    . EP IMPLANTABLE DEVICE N/A 07/23/2016   Procedure: Loop Recorder Insertion;  Surgeon: Thompson Grayer, MD;  Location: [place] [facility];  Service: Cardiovascular;  Laterality: N/A;  . Intestinal obstruction    . TEE WITHOUT CARDIOVERSION N/A 07/23/2016   Procedure: TRANSESOPHAGEAL ECHOCARDIOGRAM (TEE);  Surgeon: Lelon Perla, MD;  Location: [facility] ENDOSCOPY;  Service: Cardiovascular;  Laterality: N/A;    There were no vitals filed for this visit.      Subjective Assessment - 09/23/16 0936    Subjective "Today is a big day. I'm graduating. I've been turning my head a lot; I think my balane is getting better with that."   Patient Stated Goals "I want to improve my balance and be stronger.  Want my L leg not to fell weak."   Currently in Pain? No/denies            [facility] PT Assessment - 09/23/16 0001      Functional Gait  Assessment   Gait assessed  Yes   Gait Level Surface Walks 20 ft in less  than 5.5 sec, no assistive devices, good speed, no evidence for imbalance, normal gait pattern, deviates no more than 6 in outside of the 12 in walkway width.   Change in Gait Speed Able to smoothly change walking speed without loss of balance or gait deviation. Deviate no more than 6 in outside of the 12 in walkway width.   Gait with Horizontal Head Turns Performs head turns smoothly with slight change in gait velocity (eg, minor disruption to smooth gait path), deviates 6-10 in outside 12 in walkway width, or uses an assistive device.   Gait with Vertical Head Turns Performs head turns with no change in gait. Deviates no more than 6 in outside 12 in walkway width.   Gait and Pivot Turn Pivot turns safely within 3 sec and stops quickly with no loss of balance.   Step Over Obstacle Is able to step over 2 stacked shoe boxes taped together (9 in total height) without changing gait speed. No evidence of imbalance.   Gait with Narrow Base of Support Ambulates 7-9 steps.   Gait with Eyes Closed Walks 20 ft, uses assistive device, slower speed, mild gait deviations, deviates 6-10 in outside 12 in walkway width. Ambulates 20 ft in less than 9 sec but greater than 7 sec.   Ambulating Backwards Walks 20 ft,  uses assistive device, slower speed, mild gait deviations, deviates 6-10 in outside 12 in walkway width.   Steps Alternating feet, no rail.   Total Score 26                     [facility] Adult PT Treatment/Exercise - 09/23/16 0001      Transfers   Transfers Sit to Stand;Stand to Sit   Sit to Stand 7: Independent   Stand to Sit 7: Independent     Ambulation/Gait   Ambulation/Gait Yes   Ambulation/Gait Assistance 7: Independent   Ambulation/Gait Assistance Details Pt with intermittent veering to L when head turned to L, but no overt LOB noted.   Ambulation Distance (Feet) 1100 Feet   Assistive device None   Gait Pattern Within Functional Limits   Ambulation Surface Unlevel;Outdoor;Paved                 PT Education - 09/23/16 1324    Education provided Yes   Education Details PT goals, findings, progress, and DC plan. Added cervical spine AROM to HEP.   Person(s) Educated Patient   Methods Explanation;Demonstration;Handout;Verbal cues   Comprehension Verbalized understanding;Returned demonstration          PT Short Term Goals - 09/03/16 0903      PT SHORT TERM GOAL #1   Title Pt will be independent and verbaliz understanding of initial HEP to progress her functional status. (Target Date for all STGs: 08/30/16)   Baseline GOAL MET: 08/22/16   Time 4   Period Weeks   Status Achieved     PT SHORT TERM GOAL #2   Title Pt will improve Berg Balance score to >52/56 to decr fall risk.   Baseline 10/31: Merrilee Jansky = 56/56   Time 4   Period Weeks   Status Achieved     PT SHORT TERM GOAL #3   Title Pt will improve FGA score to >or = 23/30 to indicate decr fall risk.   Baseline 10/31: FGA = 23/30   Time 4   Period Weeks   Status Achieved     PT SHORT TERM GOAL #4   Title Pt will negotiate 8 stairs with mod I and no UE support to incr safety when entering/exiting her home.   Baseline GOAL MET: 08/22/16   Time 4   Period Weeks   Status Achieved     PT SHORT TERM GOAL #5   Title Pt will ambulate 518f with LRAD and supervision indoors over level surfaces, ramps, and curbs to incr safety when ambulating at home and in the community.   Baseline GOAL MET: 08/27/16   Time 4   Period Weeks   Status Achieved           PT Long Term Goals - 09/23/16 0943      PT LONG TERM GOAL #1   Title Pt will be independent and verbalize understanding of HEP and on-going fitness program to maintain progress made in therapy and improve overall health. (Target Date for all LTGs: 09/27/16)    Baseline 11/7:  Pt is currently going to the gym 2x/week and plans to progress to 3x/week.   Time 8   Period Weeks   Status Achieved     PT LONG TERM GOAL #2   Title Pt will improve  Berg Balance score to 56/56 to indicate decr risk of falls.   Baseline Met 10/31.   Time 8   Period Weeks   Status Achieved  PT LONG TERM GOAL #3   Title Pt will improve FGA score to > or = 27/30 to indicate decr risk of falls.   Baseline 09/25/23: FGA score = 26/30   Time 8   Period Weeks   Status Partially Met     PT LONG TERM GOAL #4   Title Pt will negotiate 12 stairs with mod I while carrying a simulated load of laundry to incr safety while performing ADLs at home and while working.   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   Time 8   Period Weeks   Status Deferred     PT LONG TERM GOAL #5   Title Pt will ambulate >108f with mod I and LRAD outdoors over unlevel surfaces, ramps, curbs, grass, and gravel to incr safety while ambulating in the community.   Baseline Met 111-21-2024   Time 8   Period Weeks   Status Achieved               Plan - [id number]    Clinical Impression Statement Pt has met/partially met all applicable goals and therefore will be discharged from outpatient PT at this time. Pt verbalized understanding and was in full agreement with DC.   Consulted and Agree with Plan of Care Patient      Patient will benefit from skilled therapeutic intervention in order to improve the following deficits and impairments:     Visit Diagnosis: Other abnormalities of gait and mobility       G-Codes - [id number]    Functional Assessment Tool Used FGA = 26/30   Functional Limitation Mobility: Walking and moving around   Mobility: Walking and Moving Around Goal Status ([phone] At least 1 percent but less than 20 percent impaired, limited or restricted   Mobility: Walking and Moving Around Discharge Status [phone] At least 1 percent but less than 20 percent impaired, limited or restricted      Problem List Patient Active Problem List   Diagnosis  Date Noted  . Diarrhea 09/12/2016  . Sleep disturbance 07/26/2016  . Overactive bladder 07/26/2016  . Tobacco abuse 07/21/2016  . Solitary pulmonary nodule 07/21/2016  . Acute [place] ([place] 07/[place]  . [place] 07/20/2016  . Hypokalemia 07/20/2016  . Essential hypertension 07/20/2016    \PHYSICAL THERAPY DISCHARGE SUMMARY  Visits from Start of Care: 11  Current functional level related to goals / functional outcomes: See above.   Remaining deficits: See above.   Education / Equipment: See above.  Plan: Patient agrees to discharge.  Patient goals were met. Patient is being discharged due to meeting the stated rehab goals.  ?????        BBillie Ruddy PT, DPT [facility] [address]Phone: [phone]  Fax:  [phone]/21/17 1:33 PM  Name: Kathleen DOROUGHMRN: [id number] of Birth: [birth year]

## 2016-09-23 NOTE — Progress Notes (Signed)
thanks

## 2016-09-23 NOTE — Progress Notes (Signed)
[facility] Summary Report / Loop Recorder 

## 2016-09-23 NOTE — Telephone Encounter (Signed)
Spoke with patient who stated that she could feel the device underneath her skin causing her concern that it may have moved. I explained that what she was describing was to be expected. I reviewed her remote transmission from 11/18 and explained the follow up process with Dr. Rayann Heman. She verbalized understanding. I gave her the [employer] tech services number to obtain an ID card. Patient will call with any other concerns.

## 2016-09-23 NOTE — Telephone Encounter (Signed)
New message       Pt thinks her loop recorder has moved in her chest. She thinks it is not in the same area.  Please call after 1pm.

## 2016-09-25 ENCOUNTER — Ambulatory Visit: Payer: Medicare HMO | Admitting: Physical Therapy

## 2016-10-02 ENCOUNTER — Ambulatory Visit (INDEPENDENT_AMBULATORY_CARE_PROVIDER_SITE_OTHER)
Admission: RE | Admit: 2016-10-02 | Discharge: 2016-10-02 | Disposition: A | Discharge status: Home / Self Care | Payer: Medicare HMO | Source: Ambulatory Visit | Attending: Internal Medicine | Admitting: Internal Medicine

## 2016-10-02 ENCOUNTER — Encounter: Payer: Self-pay | Admitting: *Deleted

## 2016-10-02 DIAGNOSIS — R911 Solitary pulmonary nodule: Secondary | ICD-10-CM | POA: Diagnosis not present

## 2016-10-02 NOTE — Progress Notes (Signed)
Patient in office for CT, had questions about ILR site, states ILR has "moved under the skin".  Checked site, incision well healed, no edema, drainage, redness, or other signs/symptoms of infection present.  Patient agrees to call the [facility] if she notes any signs/symptoms of infection.  She denies additional questions or concerns at this time.

## 2016-10-03 ENCOUNTER — Other Ambulatory Visit: Payer: Self-pay | Admitting: Internal Medicine

## 2016-10-03 DIAGNOSIS — R911 Solitary pulmonary nodule: Secondary | ICD-10-CM

## 2016-10-09 ENCOUNTER — Encounter: Payer: Self-pay | Admitting: Neurology

## 2016-10-09 ENCOUNTER — Ambulatory Visit (INDEPENDENT_AMBULATORY_CARE_PROVIDER_SITE_OTHER): Payer: Medicare HMO | Admitting: Neurology

## 2016-10-09 VITALS — BP 175/99 | HR 94 | Wt 175.2 lb

## 2016-10-09 DIAGNOSIS — I639 Cerebral infarction, unspecified: Secondary | ICD-10-CM | POA: Diagnosis not present

## 2016-10-09 DIAGNOSIS — I6381 Other cerebral infarction due to occlusion or stenosis of small artery: Secondary | ICD-10-CM

## 2016-10-09 NOTE — Progress Notes (Signed)
[facility] [address] [place]. [place] [address] [phone]       OFFICE FOLLOW-UP NOTE  Kathleen. APREL EGELHOFF Date of Birth:  January 02, 1946 Medical Record Number:  [id number]   HPI: Kathleen Guerrero is a 70 year Caucasian lady who is seen today for first office follow-up visit after hospital admission for stroke in September 2017. Lynda Wanninger Burnetis a 70 y.o.femalewent to bed sometime between 9 and 10 last night, woke in the middle the night with left leg weakness. She states that she tossed and turned, and finally came in the emergency department today after calling her friend. She states that it seems to be a stable deficits started. An MRI was performed in the emergency department which shows a subcortical stroke on the right. LKW: 9 PM 9/15 tpa given?: no, out of window. CT scan of the head showed no acute abnormality and CT angiogram showed no significant large vessel extracranial or intracranial stenosis. An incidental finding of 15 mm right upper lobe pulmonary noted was noted. CT scan of the chest was done to follow this and etiology of this is indeterminate. MRI scan of the brain subsequently showed 1.3 cm right corona radiata infarct near the motor strip. Etiology of this was indeterminate lacunar versus cryptogenic hence patient underwent transesophageal echocardiogram which showed no cardiac source of embolism. Transthoracic echo showed normal ejection fraction. Patient had loop recorder inserted and so to fibrillation have not yet been found. Area cholesterol 95 mg percent and hemoglobin A1c was 5.7. She'll start an aspirin and Lipitor. Patient has finished physical and occupation therapy. She has quit smoking. She is tolerating aspirin well with only minor bruising. She is eating healthy and walks 30 minutes every day and started doing yoga. She hasn't fact lost 9 pounds and is quite proud of this fact. She has seen pulmonologist [name] who did a follow-up CT scan chest  which shows stable appearance of the nodule and he has recommended conservative follow-up for now. Patient's blood pressure is elevated today in office in 171/99 and she blames this on an excitement of the visit. I counseled the patient to keep track of her blood pressure regularly and discuss medication treatment when she sees her primary care physician next. ROS:   14 system review of systems is positive for fatigue, tiredness and all other systems negative  PMH:  Past Medical History:  Diagnosis Date  . Hypertension   . Stroke ([place])   . Tobacco use     Social History:  Social History   Social History  . Marital status: Divorced    Spouse name: N/A  . Number of children: 1  . Years of education: 80   Occupational History  . Retired    Social History Main Topics  . Smoking status: Former Smoker    Packs/day: 1.00    Years: 50.00    Types: Cigarettes    Quit date: 07/20/2016  . Smokeless tobacco: Never Used  . Alcohol use Yes     Comment: Rarely  . Drug use: No  . Sexual activity: Not on file   Other Topics Concern  . Not on file   Social History Narrative   Fun: Read   Denies abuse and feels safe at home.     Medications:   Current Outpatient Prescriptions on File Prior to Visit  Medication Sig Dispense Refill  . aspirin 325 MG tablet Take 1 tablet (325 mg total) by mouth daily. 30 tablet 2  .  atorvastatin (LIPITOR) 40 MG tablet Take 1 tablet (40 mg total) by mouth daily at 6 PM. 30 tablet 2  . nicotine polacrilex ([employer] NICOTINE) 2 MG lozenge Take 2 mg by mouth as needed for smoking cessation.    Marland Kitchen oxybutynin (DITROPAN-XL) 5 MG 24 hr tablet TAKE 1 TABLET BY MOUTH AT BEDTIME 30 tablet 0   No current facility-administered medications on file prior to visit.     Allergies:  No Known Allergies  Physical Exam General: well developed, well nourished pleasant 70-year-old Caucasian lady, seated, in no evident distress Head: head normocephalic and atraumatic.  Neck:  supple with no carotid or supraclavicular bruits Cardiovascular: regular rate and rhythm, no murmurs Musculoskeletal: no deformity Skin:  no rash/petichiae Vascular:  Normal pulses all extremities Vitals:   10/09/16 0942  BP: (!) 175/99  Pulse: 94   Neurologic Exam Mental Status: Awake and fully alert. Oriented to place and time. Recent and remote memory intact. Attention span, concentration and fund of knowledge appropriate. Mood and affect appropriate.  Cranial Nerves: Fundoscopic exam reveals sharp disc margins. Pupils equal, briskly reactive to light. Extraocular movements full without nystagmus. Visual fields full to confrontation. Hearing intact. Facial sensation intact. Face, tongue, palate moves normally and symmetrically.  Motor: Normal bulk and tone. Normal strength in all tested extremity muscles. Diminished fine finger movements on the left. Orbits right over left upper extremity. Sensory.: intact to touch ,pinprick .position and vibratory sensation.  Coordination: Rapid alternating movements normal in all extremities. Finger-to-nose and heel-to-shin performed accurately bilaterally. Gait and Station: Arises from chair without difficulty. Stance is normal. Gait demonstrates normal stride length and balance . Able to heel, toe and tandem walk without difficulty.  Reflexes: 1+ and symmetric. Toes downgoing.   NIHSS  0 Modified Rankin  1   ASSESSMENT: 70 year Caucasian lady with right brain subcortical infarct in September 2017 etiology indeterminate lacunar versus cryptogenic given large size. Vascular risk factors of hyperlipidemia only    PLAN: I had a long d/w patient about her recent stroke, risk for recurrent stroke/TIAs, personally independently reviewed imaging studies and stroke evaluation results and answered questions.Continue aspirin 325 mg daily  for secondary stroke prevention and maintain strict control of hypertension with blood pressure goal below 130/90,  diabetes with hemoglobin A1c goal below 6.5% and lipids with LDL cholesterol goal below 70 mg/dL. I also advised the patient to eat a healthy diet with plenty of whole grains, cereals, fruits and vegetables, exercise regularly and maintain ideal body weight Followup in the future with my nurse practitioner in 6 months or call earlier if necessary Greater than 50% of time during this 25 minute visit was spent on counseling,explanation of diagnosis, planning of further management, discussion with patient and family and coordination of care Antony Contras, MD  [facility] [address] [place], [place] [address]  Phone [phone] Fax [phone] Note: This document was prepared with digital dictation and possible smart phrase technology. Any transcriptional errors that result from this process are unintentional

## 2016-10-09 NOTE — Patient Instructions (Addendum)
Stroke Prevention Some medical conditions and behaviors are associated with an increased chance of having a stroke. You may prevent a stroke by making healthy choices and managing medical conditions. How can I reduce my risk of having a stroke?  Stay physically active. Get at least 30 minutes of activity on most or all days.  Do not smoke. It may also be helpful to avoid exposure to secondhand smoke.  Limit alcohol use. Moderate alcohol use is considered to be:  No more than 2 drinks per day for men.  No more than 1 drink per day for nonpregnant women.  Eat healthy foods. This involves:  Eating 5 or more servings of fruits and vegetables a day.  Making dietary changes that address high blood pressure (hypertension), high cholesterol, diabetes, or obesity.  Manage your cholesterol levels.  Making food choices that are high in fiber and low in saturated fat, trans fat, and cholesterol may control cholesterol levels.  Take any prescribed medicines to control cholesterol as directed by your health care provider.  Manage your diabetes.  Controlling your carbohydrate and sugar intake is recommended to manage diabetes.  Take any prescribed medicines to control diabetes as directed by your health care provider.  Control your hypertension.  Making food choices that are low in salt (sodium), saturated fat, trans fat, and cholesterol is recommended to manage hypertension.  Ask your health care provider if you need treatment to lower your blood pressure. Take any prescribed medicines to control hypertension as directed by your health care provider.  If you are 18-39 years of age, have your blood pressure checked every 3-5 years. If you are 40 years of age or older, have your blood pressure checked every year.  Maintain a healthy weight.  Reducing calorie intake and making food choices that are low in sodium, saturated fat, trans fat, and cholesterol are recommended to manage  weight.  Stop drug abuse.  Avoid taking birth control pills.  Talk to your health care provider about the risks of taking birth control pills if you are over 35 years old, smoke, get migraines, or have ever had a blood clot.  Get evaluated for sleep disorders (sleep apnea).  Talk to your health care provider about getting a sleep evaluation if you snore a lot or have excessive sleepiness.  Take medicines only as directed by your health care provider.  For some people, aspirin or blood thinners (anticoagulants) are helpful in reducing the risk of forming abnormal blood clots that can lead to stroke. If you have the irregular heart rhythm of atrial fibrillation, you should be on a blood thinner unless there is a good reason you cannot take them.  Understand all your medicine instructions.  Make sure that other conditions (such as anemia or atherosclerosis) are addressed. Get help right away if:  You have sudden weakness or numbness of the face, arm, or leg, especially on one side of the body.  Your face or eyelid droops to one side.  You have sudden confusion.  You have trouble speaking (aphasia) or understanding.  You have sudden trouble seeing in one or both eyes.  You have sudden trouble walking.  You have dizziness.  You have a loss of balance or coordination.  You have a sudden, severe headache with no known cause.  You have new chest pain or an irregular heartbeat. Any of these symptoms may represent a serious problem that is an emergency. Do not wait to see if the symptoms will go away.   Get medical help at once. Call your local emergency services (911 in [place] Do not drive yourself to the hospital. This information is not intended to replace advice given to you by your health care provider. Make sure you discuss any questions you have with your health care provider. Document Released: 11/28/2004 Document Revised: 03/28/2016 Document Reviewed: 04/23/2013 Elsevier  Interactive Patient Education  2017 Elsevier Inc.  

## 2016-10-21 ENCOUNTER — Ambulatory Visit (INDEPENDENT_AMBULATORY_CARE_PROVIDER_SITE_OTHER): Payer: Medicare HMO | Admitting: *Deleted

## 2016-10-21 DIAGNOSIS — I639 Cerebral infarction, unspecified: Secondary | ICD-10-CM

## 2016-10-22 ENCOUNTER — Other Ambulatory Visit: Payer: Self-pay | Admitting: Family

## 2016-10-22 NOTE — Progress Notes (Signed)
[facility] Summary Report / Loop Recorder 

## 2016-10-24 ENCOUNTER — Other Ambulatory Visit: Payer: Self-pay | Admitting: Family

## 2016-10-24 MED ORDER — ASPIRIN 325 MG PO TABS: 325.0000 mg | ORAL_TABLET | Freq: Every day | ORAL | 3 refills | Status: DC

## 2016-11-02 LAB — CUP PACEART REMOTE DEVICE CHECK
Implantable Pulse Generator Implant Date: 20170919
MDC IDC SESS DTM: 20171118214603

## 2016-11-02 NOTE — Progress Notes (Signed)
Carelink summary report received. Battery status OK. Normal device function. No new symptom episodes, tachy episodes, brady, or pause episodes. No new AF episodes. Monthly summary reports and ROV/PRN 

## 2016-11-20 ENCOUNTER — Ambulatory Visit (INDEPENDENT_AMBULATORY_CARE_PROVIDER_SITE_OTHER): Payer: Medicare HMO | Admitting: *Deleted

## 2016-11-20 DIAGNOSIS — I639 Cerebral infarction, unspecified: Secondary | ICD-10-CM | POA: Diagnosis not present

## 2016-11-22 ENCOUNTER — Other Ambulatory Visit: Payer: Self-pay | Admitting: Family

## 2016-11-22 NOTE — Progress Notes (Signed)
[facility] Summary Report / Loop Recorder 

## 2016-12-09 LAB — CUP PACEART REMOTE DEVICE CHECK
Implantable Pulse Generator Implant Date: 20170919
MDC IDC SESS DTM: 20171218224033

## 2016-12-20 ENCOUNTER — Ambulatory Visit (INDEPENDENT_AMBULATORY_CARE_PROVIDER_SITE_OTHER): Payer: Medicare HMO | Admitting: *Deleted

## 2016-12-20 DIAGNOSIS — I639 Cerebral infarction, unspecified: Secondary | ICD-10-CM

## 2016-12-23 NOTE — Progress Notes (Signed)
[facility] Summary Report / Loop Recorder 

## 2016-12-28 LAB — CUP PACEART REMOTE DEVICE CHECK
Implantable Pulse Generator Implant Date: 20170919
MDC IDC SESS DTM: 20180117230549

## 2017-01-02 ENCOUNTER — Ambulatory Visit (INDEPENDENT_AMBULATORY_CARE_PROVIDER_SITE_OTHER): Payer: Medicare HMO | Admitting: Internal Medicine

## 2017-01-02 ENCOUNTER — Ambulatory Visit: Payer: Medicare HMO | Admitting: Internal Medicine

## 2017-01-02 ENCOUNTER — Ambulatory Visit (INDEPENDENT_AMBULATORY_CARE_PROVIDER_SITE_OTHER)
Admission: RE | Admit: 2017-01-02 | Discharge: 2017-01-02 | Disposition: A | Discharge status: Home / Self Care | Payer: Medicare HMO | Source: Ambulatory Visit | Attending: Internal Medicine | Admitting: Internal Medicine

## 2017-01-02 ENCOUNTER — Encounter: Payer: Self-pay | Admitting: Internal Medicine

## 2017-01-02 VITALS — BP 170/100 | HR 110 | Ht 62.75 in | Wt 177.4 lb

## 2017-01-02 DIAGNOSIS — I10 Essential (primary) hypertension: Secondary | ICD-10-CM | POA: Diagnosis not present

## 2017-01-02 DIAGNOSIS — R911 Solitary pulmonary nodule: Secondary | ICD-10-CM

## 2017-01-02 MED ORDER — METOPROLOL TARTRATE 25 MG PO TABS: 25.0000 mg | ORAL_TABLET | Freq: Two times a day (BID) | ORAL | 2 refills | Status: DC

## 2017-01-02 NOTE — Patient Instructions (Signed)
Start lopressor 25 mg twice daily until you see your Primary care provider  We will contact you in 6 months for follow up CT chest and call you the results - call us in meantime for any  pain with breathing or new persistent cough

## 2017-01-02 NOTE — Progress Notes (Signed)
Subjective:     Patient ID: Kathleen Guerrero, female   DOB: May 21, 1946,   MRN: [id number]     Brief patient profile:  40 yowf quit smoking 07/2016 p admit for cva / left leg weakness resolved with PT but referred to pulmonary clinic 09/06/2016 by Dr   Leonie Man for incidental SPN  History of Present Illness  09/06/2016 1st [facility] office visit/ Kathleen Guerrero   Chief Complaint  Patient presents with  . Pulmonary Consult    Referred by Dr Mauricio Po for eval of pulmonary nodule. The pt denies any respiratory co's today.   Not limited by breathing from desired activities  rec Please see patient coordinator before you leave today  to schedule  a non contrasted CT chest after Thanksgiving and I will call you with the next step    bp elevation noted, rec avoid salt and self monitor with f/u with PC    CT chest 01/02/17 Stable 8 x 14 mm ground-glass pulmonary nodule in peripheral right upper lobe. This remains suspicious for low-grade adenocarcinoma. Thoracic surgical consultation is recommended for consideration of biopsy or surgical resection.  No evidence of lymphadenopathy or pleural effusion.  Incidental findings including aortic and coronary atherosclerosis, tiny hiatal hernia, and left nephrolithiasis.    01/02/2017  f/u ov/Kathleen Guerrero re:  RUL spn /hbp Chief Complaint  Patient presents with  . Follow-up    CT Chest repeated this am.      Not limited by breathing from desired activities  / still not smoking     No obvious day to day or daytime variability or assoc chronic cough or cp or chest tightness, subjective wheeze or overt sinus or hb symptoms. No unusual exp hx or h/o childhood pna/ asthma or knowledge of premature birth.  Sleeping ok without nocturnal  or early am exacerbation  of respiratory  c/o's or need for noct saba. Also denies any obvious fluctuation of symptoms with weather or environmental changes or other aggravating or alleviating factors except as outlined above    Current Medications, Allergies, Complete Past Medical History, Past Surgical History, Family History, and Social History were reviewed in [employer] record.  ROS  The following are not active complaints unless bolded sore throat, dysphagia, dental problems, itching, sneezing,  nasal congestion or excess/ purulent secretions, ear ache,   fever, chills, sweats, unintended wt loss, classically pleuritic or exertional cp, hemoptysis,  orthopnea pnd or leg swelling, presyncope, palpitations, abdominal pain, anorexia, nausea, vomiting, diarrhea  or change in bowel or bladder habits, change in stools or urine, dysuria,hematuria,  rash, arthralgias, visual complaints, headache, numbness, weakness or ataxia or problems with walking or coordination ,  change in mood/affect or memory.        Review of Systems     Objective:   Physical Exam   amb wf nad    01/02/2017        177  09/06/16 176 lb (79.8 kg)  07/26/16 182 lb (82.6 kg)  07/22/16 192 lb 4.8 oz (87.2 kg)    Vital signs reviewed - - Note on arrival 02 sats  96% on RA- note bp 170/100      HEENT: nl dentition, turbinates, and oropharynx. Nl external ear canals without cough reflex   NECK :  without JVD/Nodes/TM/ nl carotid upstrokes bilaterally   LUNGS: no acc muscle use,  Nl contour chest which is clear to A and P bilaterally without cough on insp or exp maneuvers   CV:  RRR  no s3 or murmur or increase in P2, no edema   ABD:  soft and nontender with nl inspiratory excursion in the supine position. No bruits or organomegaly, bowel sounds nl  MS:  Nl gait/ ext warm without deformities, calf tenderness, cyanosis or clubbing No obvious joint restrictions   SKIN: warm and dry without lesions    NEURO:  alert, approp, nl sensorium with  no motor deficits      I personally reviewed images and agree with radiology impression as follows:  CT  Chest    01/02/2017  Stable 8 x 14 mm ground-glass pulmonary  nodule in peripheral right upper lobe. This remains suspicious for low-grade adenocarcinoma. Thoracic surgical consultation is recommended for consideration of biopsy or surgical resection.      Assessment:

## 2017-01-02 NOTE — Assessment & Plan Note (Addendum)
Lopressor 25 mg bid until sees PC started 01/02/2017

## 2017-01-03 ENCOUNTER — Encounter: Payer: Self-pay | Admitting: Family

## 2017-01-03 ENCOUNTER — Ambulatory Visit (INDEPENDENT_AMBULATORY_CARE_PROVIDER_SITE_OTHER): Payer: Medicare HMO | Admitting: Family

## 2017-01-03 VITALS — BP 160/98 | HR 104 | Resp 16 | Ht 62.75 in | Wt 177.0 lb

## 2017-01-03 DIAGNOSIS — I10 Essential (primary) hypertension: Secondary | ICD-10-CM | POA: Diagnosis not present

## 2017-01-03 DIAGNOSIS — Z720 Tobacco use: Secondary | ICD-10-CM | POA: Diagnosis not present

## 2017-01-03 DIAGNOSIS — E785 Hyperlipidemia, unspecified: Secondary | ICD-10-CM | POA: Diagnosis not present

## 2017-01-03 NOTE — Progress Notes (Signed)
Subjective:    Patient ID: Kathleen Guerrero, female    DOB: 05-25-1946, 71 y.o.   MRN: [id number]  Chief Complaint  Patient presents with  . Hypertension    went to see Dr. Marlene Lard yesterday and bp was elevated     HPI:  Kathleen Guerrero is a 71 y.o. female who  has a past medical history of Hypertension; Stroke ([place]); and Tobacco use. and presents today for an   Hypertension - Continues to experience the associated symptoms of elevated blood pressure that has been going on since her stroke. Does have an occasional headache when she wakes up with no changes in vision or dizziness. Denies worst headache of life. Was recently seen by pulmonology and started on metoprolol which she has not started yet. Nutritionally she is not falling a low sodium diet. Physical activity.  BP Readings from Last 3 Encounters:  01/03/17 (!) 160/98  01/02/17 (!) 170/100  10/09/16 (!) 175/99   2.) Dyslipidemia - Currently maintained on atorvastatin. Reports taking the medication as prescribed and denies adverse side effects or myalgias. No chest pain, shortness of breath, or palpitations. Denies paroxysmal nocturnal dyspnea.   No Known Allergies    Outpatient Medications Prior to Visit  Medication Sig Dispense Refill  . aspirin 325 MG tablet TAKE 1 TABLET BY MOUTH EVERY DAY 30 tablet 11  . atorvastatin (LIPITOR) 40 MG tablet TAKE 1 TABLET BY MOUTH EVERY DAY at 6 p.m. 30 tablet 3  . diphenhydrAMINE (BENADRYL) 25 MG tablet Take 25 mg by mouth at bedtime as needed for sleep.    . nicotine polacrilex ([employer] NICOTINE) 2 MG lozenge Take 2 mg by mouth as needed for smoking cessation.    Marland Kitchen oxybutynin (DITROPAN-XL) 5 MG 24 hr tablet TAKE 1 TABLET BY MOUTH AT BEDTIME 30 tablet 0  . metoprolol tartrate (LOPRESSOR) 25 MG tablet Take 1 tablet (25 mg total) by mouth 2 (two) times daily. (Patient not taking: Reported on 01/03/2017) 60 tablet 2   No facility-administered medications prior to visit.     Review of Systems    Constitutional: Negative for chills and fever.  Eyes:       Negative for changes in vision  Respiratory: Negative for cough, chest tightness and wheezing.   Cardiovascular: Negative for chest pain, palpitations and leg swelling.  Neurological: Negative for dizziness, weakness and light-headedness.       Objective:    BP (!) 160/98 (BP Location: Left Arm, Patient Position: Sitting, Cuff Size: Large)   Pulse (!) 104   Resp 16   Ht 5' 2.75" (1.594 m)   Wt 177 lb (80.3 kg)   SpO2 96%   BMI 31.60 kg/m  Nursing note and vital signs reviewed.  Physical Exam  Constitutional: She is oriented to person, place, and time. She appears well-developed and well-nourished. No distress.  Cardiovascular: Normal rate, regular rhythm, normal heart sounds and intact distal pulses.  Exam reveals no gallop and no friction rub.   No murmur heard. Pulmonary/Chest: Effort normal and breath sounds normal. No respiratory distress. She has no wheezes. She has no rales. She exhibits no tenderness.  Neurological: She is alert and oriented to person, place, and time.  Skin: Skin is warm and dry.  Psychiatric: She has a normal mood and affect. Her behavior is normal. Judgment and thought content normal.       Assessment & Plan:   Problem List Items Addressed This Visit      Cardiovascular and  Mediastinum   Essential hypertension - Primary    Blood pressure remains elevated above goal 140/90 with lifestyle management and started on metoprolol in the last 24 hours. Denies worse headache of life with no new symptoms of end organ damage noted on physical exam. Aggressive blood pressure control indicated secondary to most recent cerebrovascular accident. Continue current dosage of metoprolol. Follow-up in one week for blood pressure assessment. If remains elevated consider additional medications. Encouraged follow sodium diet to monitor blood pressure at home as able.        Other   Tobacco abuse (Chronic)     Patient indicates she has successfully quit smoking. Encouraged to remain tobacco free to reduce risk for end organ damage in the future. Congratulated on her current smoking cessation.      Dyslipidemia, goal LDL below 70    Currently maintained on atorvastatin and due for cholesterol check. Obtain lipid profile. Continue current dosage of atorvastatin pending lipid profile results.      Relevant Orders   Lipid Profile       I am having Ms. Wisdom maintain her nicotine polacrilex, atorvastatin, aspirin, oxybutynin, diphenhydrAMINE, and metoprolol tartrate.   Follow-up: Return in about 1 week (around 01/10/2017), or if symptoms worsen or fail to improve.  Mauricio Po, FNP

## 2017-01-03 NOTE — Assessment & Plan Note (Signed)
Blood pressure remains elevated above goal 140/90 with lifestyle management and started on metoprolol in the last 24 hours. Denies worse headache of life with no new symptoms of end organ damage noted on physical exam. Aggressive blood pressure control indicated secondary to most recent cerebrovascular accident. Continue current dosage of metoprolol. Follow-up in one week for blood pressure assessment. If remains elevated consider additional medications. Encouraged follow sodium diet to monitor blood pressure at home as able.

## 2017-01-03 NOTE — Assessment & Plan Note (Signed)
Patient indicates she has successfully quit smoking. Encouraged to remain tobacco free to reduce risk for end organ damage in the future. Congratulated on her current smoking cessation.

## 2017-01-03 NOTE — Patient Instructions (Signed)
Thank you for choosing [facility].  SUMMARY AND INSTRUCTIONS:  Follow a low sodium under 2 g (2000 mg) daily.  Follow up in 1 week.   Check blood pressure as able.   Fasting for cholesterol check at nurse visit in 1 week.   Medication:  Start the metoprolol.   Your prescription(s) have been submitted to your pharmacy or been printed and provided for you. Please take as directed and contact our office if you believe you are having problem(s) with the medication(s) or have any questions.  Labs:  Please stop by the lab on the lower level of the building for your blood work. Your results will be released to [place] (or called to you) after review, usually within 72 hours after test completion. If any changes need to be made, you will be notified at that same time.  1.) The lab is open from 7:30am to 5:30 pm Monday-Friday 2.) No appointment is necessary 3.) Fasting (if needed) is 6-8 hours after food and drink; black coffee and water are okay    Follow up:  If your symptoms worsen or fail to improve, please contact our office for further instruction, or in case of emergency go directly to the emergency room at the closest medical facility.    DASH Eating Plan DASH stands for "Dietary Approaches to Stop Hypertension." The DASH eating plan is a healthy eating plan that has been shown to reduce high blood pressure (hypertension). It may also reduce your risk for type 2 diabetes, heart disease, and stroke. The DASH eating plan may also help with weight loss. What are tips for following this plan? General guidelines   Avoid eating more than 2,300 mg (milligrams) of salt (sodium) a day. If you have hypertension, you may need to reduce your sodium intake to 1,500 mg a day.  Limit alcohol intake to no more than 1 drink a day for nonpregnant women and 2 drinks a day for men. One drink equals 12 oz of beer, 5 oz of wine, or 1 oz of hard liquor.  Work with your health care provider  to maintain a healthy body weight or to lose weight. Ask what an ideal weight is for you.  Get at least 30 minutes of exercise that causes your heart to beat faster (aerobic exercise) most days of the week. Activities may include walking, swimming, or biking.  Work with your health care provider or diet and nutrition specialist (dietitian) to adjust your eating plan to your individual calorie needs. Reading food labels   Check food labels for the amount of sodium per serving. Choose foods with less than 5 percent of the Daily Value of sodium. Generally, foods with less than 300 mg of sodium per serving fit into this eating plan.  To find whole grains, look for the word "whole" as the first word in the ingredient list. Shopping   Buy products labeled as "low-sodium" or "no salt added."  Buy fresh foods. Avoid canned foods and premade or frozen meals. Cooking   Avoid adding salt when cooking. Use salt-free seasonings or herbs instead of table salt or sea salt. Check with your health care provider or pharmacist before using salt substitutes.  Do not fry foods. Cook foods using healthy methods such as baking, boiling, grilling, and broiling instead.  Cook with heart-healthy oils, such as olive, canola, soybean, or sunflower oil. Meal planning    Eat a balanced diet that includes:  5 or more servings of fruits and vegetables  each day. At each meal, try to fill half of your plate with fruits and vegetables.  Up to 6-8 servings of whole grains each day.  Less than 6 oz of lean meat, poultry, or fish each day. A 3-oz serving of meat is about the same size as a deck of cards. One egg equals 1 oz.  2 servings of low-fat dairy each day.  A serving of nuts, seeds, or beans 5 times each week.  Heart-healthy fats. Healthy fats called Omega-3 fatty acids are found in foods such as flaxseeds and coldwater fish, like sardines, salmon, and mackerel.  Limit how much you eat of the  following:  Canned or prepackaged foods.  Food that is high in trans fat, such as fried foods.  Food that is high in saturated fat, such as fatty meat.  Sweets, desserts, sugary drinks, and other foods with added sugar.  Full-fat dairy products.  Do not salt foods before eating.  Try to eat at least 2 vegetarian meals each week.  Eat more home-cooked food and less restaurant, buffet, and fast food.  When eating at a restaurant, ask that your food be prepared with less salt or no salt, if possible. What foods are recommended? The items listed may not be a complete list. Talk with your dietitian about what dietary choices are best for you. Grains  Whole-grain or whole-wheat bread. Whole-grain or whole-wheat pasta. Brown rice. [place] Morrow. Bulgur. Whole-grain and low-sodium cereals. Pita bread. Low-fat, low-sodium crackers. Whole-wheat flour tortillas. Vegetables  Fresh or frozen vegetables (raw, steamed, roasted, or grilled). Low-sodium or reduced-sodium tomato and vegetable juice. Low-sodium or reduced-sodium tomato sauce and tomato paste. Low-sodium or reduced-sodium canned vegetables. Fruits  All fresh, dried, or frozen fruit. Canned fruit in natural juice (without added sugar). Meat and other protein foods  Skinless chicken or [place]. Ground chicken or [place]. Pork with fat trimmed off. Fish and seafood. Egg whites. Dried beans, peas, or lentils. Unsalted nuts, nut butters, and seeds. Unsalted canned beans. Lean cuts of beef with fat trimmed off. Low-sodium, lean deli meat. Dairy  Low-fat (1%) or fat-free (skim) milk. Fat-free, low-fat, or reduced-fat cheeses. Nonfat, low-sodium ricotta or cottage cheese. Low-fat or nonfat yogurt. Low-fat, low-sodium cheese. Fats and oils  Soft margarine without trans fats. Vegetable oil. Low-fat, reduced-fat, or light mayonnaise and salad dressings (reduced-sodium). Canola, safflower, olive, soybean, and sunflower oils. Avocado. Seasoning and  other foods  Herbs. Spices. Seasoning mixes without salt. Unsalted popcorn and pretzels. Fat-free sweets. What foods are not recommended? The items listed may not be a complete list. Talk with your dietitian about what dietary choices are best for you. Grains  Baked goods made with fat, such as croissants, muffins, or some breads. Dry pasta or rice meal packs. Vegetables  Creamed or fried vegetables. Vegetables in a cheese sauce. Regular canned vegetables (not low-sodium or reduced-sodium). Regular canned tomato sauce and paste (not low-sodium or reduced-sodium). Regular tomato and vegetable juice (not low-sodium or reduced-sodium). Angie Fava. Olives. Fruits  Canned fruit in a light or heavy syrup. Fried fruit. Fruit in cream or butter sauce. Meat and other protein foods  Fatty cuts of meat. Ribs. Fried meat. Berniece Salines. Sausage. Bologna and other processed lunch meats. Salami. Fatback. Hotdogs. Bratwurst. Salted nuts and seeds. Canned beans with added salt. Canned or smoked fish. Whole eggs or egg yolks. Chicken or [place] with skin. Dairy  Whole or 2% milk, cream, and half-and-half. Whole or full-fat cream cheese. Whole-fat or sweetened yogurt. Full-fat cheese. Nondairy  creamers. Whipped toppings. Processed cheese and cheese spreads. Fats and oils  Butter. Stick margarine. Lard. Shortening. Ghee. Bacon fat. Tropical oils, such as coconut, palm kernel, or palm oil. Seasoning and other foods  Salted popcorn and pretzels. Onion salt, garlic salt, seasoned salt, table salt, and sea salt. Worcestershire sauce. Tartar sauce. Barbecue sauce. Teriyaki sauce. Soy sauce, including reduced-sodium. Steak sauce. Canned and packaged gravies. Fish sauce. Oyster sauce. Cocktail sauce. Horseradish that you find on the shelf. Ketchup. Mustard. Meat flavorings and tenderizers. Bouillon cubes. Hot sauce and Tabasco sauce. Premade or packaged marinades. Premade or packaged taco seasonings. Relishes. Regular salad  dressings. Where to find more information:  National Heart, Lung, and [place]: https://wilson-eaton.com/  American Heart Association: www.heart.org Summary  The DASH eating plan is a healthy eating plan that has been shown to reduce high blood pressure (hypertension). It may also reduce your risk for type 2 diabetes, heart disease, and stroke.  With the DASH eating plan, you should limit salt (sodium) intake to 2,300 mg a day. If you have hypertension, you may need to reduce your sodium intake to 1,500 mg a day.  When on the DASH eating plan, aim to eat more fresh fruits and vegetables, whole grains, lean proteins, low-fat dairy, and heart-healthy fats.  Work with your health care provider or diet and nutrition specialist (dietitian) to adjust your eating plan to your individual calorie needs. This information is not intended to replace advice given to you by your health care provider. Make sure you discuss any questions you have with your health care provider. Document Released: 10/10/2011 Document Revised: 10/14/2016 Document Reviewed: 10/14/2016 Elsevier Interactive Patient Education  2017 [employer].

## 2017-01-03 NOTE — Assessment & Plan Note (Signed)
Currently maintained on atorvastatin and due for cholesterol check. Obtain lipid profile. Continue current dosage of atorvastatin pending lipid profile results.

## 2017-01-08 LAB — CUP PACEART REMOTE DEVICE CHECK
Implantable Pulse Generator Implant Date: 20170919
MDC IDC SESS DTM: 20180216230851

## 2017-01-10 ENCOUNTER — Encounter: Payer: Self-pay | Admitting: Family

## 2017-01-10 ENCOUNTER — Telehealth: Payer: Self-pay | Admitting: Family

## 2017-01-10 ENCOUNTER — Ambulatory Visit: Payer: Medicare HMO

## 2017-01-10 ENCOUNTER — Other Ambulatory Visit (INDEPENDENT_AMBULATORY_CARE_PROVIDER_SITE_OTHER): Payer: Medicare HMO

## 2017-01-10 VITALS — BP 150/86

## 2017-01-10 DIAGNOSIS — E785 Hyperlipidemia, unspecified: Secondary | ICD-10-CM

## 2017-01-10 DIAGNOSIS — Z0130 Encounter for examination of blood pressure without abnormal findings: Secondary | ICD-10-CM

## 2017-01-10 LAB — LIPID PANEL
CHOLESTEROL: 105 mg/dL (ref 0–200)
HDL: 45.1 mg/dL (ref >39.00)
LDL Cholesterol: 41 mg/dL (ref 0–99)
NonHDL: 60.05
TRIGLYCERIDES: 97 mg/dL (ref 0.0–149.0)
Total CHOL/HDL Ratio: 2
VLDL: 19.4 mg/dL (ref 0.0–40.0)

## 2017-01-10 NOTE — Telephone Encounter (Signed)
Please increase metoprolol to 50 mg twice daily. Follow up in 2 weeks.

## 2017-01-10 NOTE — Telephone Encounter (Signed)
Pt called in about his bp check today.  She wants to know if her BP meds will be increased

## 2017-01-10 NOTE — Progress Notes (Signed)
Pt is in for Nurse visit today.  Her BP is still elevated. Pt admits that she has not had her BP meds this morning but did take it last night. Pt also wanted Marya Amsler to know that her BP was 188/100 last night and she does not feel her  BP is under control.   Patient given a glass of water and stayed in room for 30 minutes before BP was rechecked. Both readings are elevated. Note completed and sent to Terri Piedra, FNP for additional recommendation.

## 2017-01-11 NOTE — Telephone Encounter (Signed)
Pt came in on Saturday very upset that no one has followed up with her in regards to her message sent. I apologized to pt repeatedly for no call back. I also informed pt of the providers response back and scheduled pt to follow up with PCP in 2 weeks.    Pt would like a call back in reguards to not receiving a  call back       Thanks.

## 2017-01-13 ENCOUNTER — Encounter: Payer: Self-pay | Admitting: Internal Medicine

## 2017-01-13 NOTE — Telephone Encounter (Signed)
Called pt back and advised that we apologized about Friday. Pt states she was not satisfied with my response. She feels that we do not care about her health as much as she does and she is also is not happy with our phone system. She stated that she had to wait 45 minutes for anyone to answer the phone and she had to drive over here just to ask a question. Stated that we have "procedural issues" and that she wanted this issue relayed to Almyra Free but she did not want to talk to Almyra Free bc she did not want to repeat what she had to say. I just apologized to the pt.

## 2017-01-13 NOTE — Assessment & Plan Note (Signed)
See  Incidental CT neck 07/22/16  RUL spiculated s/p smoking cessation 07/2016  - Spirometry 09/06/2016  wnl - CT chest  10/02/2016 Stable ground-glass nodule in the RIGHT upper lobe over 2 month interval. Lesion has suspicious morphology for a low-grade adenocarcinoma (atypical adenomatous hyperplasia) but nonspecific.  Recommend follow-up CT 01/02/2017 > no change/ pt refused T surg eval> 6 m reminder file done  Discussed in detail all the  indications, usual  risks and alternatives  relative to the benefits with patient who agrees to proceed with conservative f/u as outlined

## 2017-01-15 ENCOUNTER — Telehealth: Payer: Self-pay | Admitting: Family

## 2017-01-15 ENCOUNTER — Encounter: Payer: Self-pay | Admitting: Internal Medicine

## 2017-01-15 ENCOUNTER — Ambulatory Visit (INDEPENDENT_AMBULATORY_CARE_PROVIDER_SITE_OTHER): Payer: Medicare HMO | Admitting: Internal Medicine

## 2017-01-15 DIAGNOSIS — I10 Essential (primary) hypertension: Secondary | ICD-10-CM | POA: Diagnosis not present

## 2017-01-15 MED ORDER — VALSARTAN 160 MG PO TABS: 160.0000 mg | ORAL_TABLET | Freq: Every day | ORAL | 3 refills | Status: DC

## 2017-01-15 MED ORDER — METOPROLOL TARTRATE 50 MG PO TABS: 50.0000 mg | ORAL_TABLET | Freq: Two times a day (BID) | ORAL | 11 refills | Status: DC

## 2017-01-15 NOTE — Progress Notes (Signed)
   Subjective:    Patient ID: Kathleen Guerrero, female    DOB: 1946/07/10, 71 y.o.   MRN: [id number]  HPI  Here with elev BP 197/100 earlier today despite lopressor increased to 50 mg twice per day,  + hx of cva, concerned about recurrence.  Pt denies chest pain, increased sob or doe, wheezing, orthopnea, PND, increased LE swelling, palpitations, dizziness or syncope.  Pt denies new neurological symptoms such as new headache, or facial or extremity weakness or numbness   Pt denies polydipsia, polyuria Past Medical History:  Diagnosis Date  . Hypertension   . Stroke ([place])   . Tobacco use    Past Surgical History:  Procedure Laterality Date  . DERMOID CYST  EXCISION    . EP IMPLANTABLE DEVICE N/A 07/23/2016   Procedure: Loop Recorder Insertion;  Surgeon: Thompson Grayer, MD;  Location: [facility] [place] [facility];  Service: Cardiovascular;  Laterality: N/A;  . Intestinal obstruction    . TEE WITHOUT CARDIOVERSION N/A 07/23/2016   Procedure: TRANSESOPHAGEAL ECHOCARDIOGRAM (TEE);  Surgeon: Lelon Perla, MD;  Location: [facility] ENDOSCOPY;  Service: Cardiovascular;  Laterality: N/A;    reports that she quit smoking about 5 months ago. Her smoking use included Cigarettes. She has a 50.00 pack-year smoking history. She has never used smokeless tobacco. She reports that she drinks alcohol. She reports that she does not use drugs. family history includes Cancer in her maternal grandfather; Kidney disease in her maternal grandmother. No Known Allergies Current Outpatient Prescriptions on File Prior to Visit  Medication Sig Dispense Refill  . aspirin 325 MG tablet TAKE 1 TABLET BY MOUTH EVERY DAY 30 tablet 11  . atorvastatin (LIPITOR) 40 MG tablet TAKE 1 TABLET BY MOUTH EVERY DAY at 6 p.m. 30 tablet 3  . diphenhydrAMINE (BENADRYL) 25 MG tablet Take 25 mg by mouth at bedtime.     . nicotine polacrilex ([facility] NICOTINE) 2 MG lozenge Take 2 mg by mouth as needed for smoking cessation.    Marland Kitchen oxybutynin (DITROPAN-XL) 5 MG 24  hr tablet TAKE 1 TABLET BY MOUTH AT BEDTIME 30 tablet 0   No current facility-administered medications on file prior to visit.     Review of Systems All otherwise neg per pt     Objective:   Physical Exam BP (!) 158/110   Pulse 86   Temp 97.6 F (36.4 C)   Ht '5\' 2"'$  (1.575 m)   Wt 175 lb (79.4 kg)   SpO2 96%   BMI 32.01 kg/m  VS noted, not ill appearing Constitutional: Pt appears in no apparent distress HENT: Head: NCAT.  Right Ear: External ear normal.  Left Ear: External ear normal.  Eyes: . Pupils are equal, round, and reactive to light. Conjunctivae and EOM are normal Neck: Normal range of motion. Neck supple.  Cardiovascular: Normal rate and regular rhythm.   Pulmonary/Chest: Effort normal and breath sounds without rales or wheezing.  Neurological: Pt is alert. Not confused , motor grossly intact Skin: Skin is warm. No rash, no LE edema Psychiatric: Pt behavior is normal. No agitation.  No other exam findings    Assessment & Plan:

## 2017-01-15 NOTE — Patient Instructions (Addendum)
Please take all new medication as prescribed - the diovan 160 mg per day  Please continue the lopressor at 50 mg twice per day  Please continue to monitor your Blood Pressure at home, and call in 1 wk if still > 140/90  Please continue all other medications as before, and refills have been done if requested.  Please have the pharmacy call with any other refills you may need.  Please keep your appointments with your specialists as you may have planned  Plesae keep your appt with Terri Piedra as you have planned

## 2017-01-16 ENCOUNTER — Emergency Department (HOSPITAL_COMMUNITY): Payer: Medicare HMO

## 2017-01-16 ENCOUNTER — Encounter (HOSPITAL_COMMUNITY): Payer: Self-pay | Admitting: Neurology

## 2017-01-16 ENCOUNTER — Observation Stay (HOSPITAL_COMMUNITY)
Admission: EM | Admit: 2017-01-16 | Discharge: 2017-01-17 | Disposition: A | Discharge status: Home / Self Care | Payer: Medicare HMO | Attending: Internal Medicine | Admitting: Internal Medicine

## 2017-01-16 ENCOUNTER — Observation Stay (HOSPITAL_COMMUNITY): Payer: Medicare HMO

## 2017-01-16 DIAGNOSIS — R4789 Other speech disturbances: Secondary | ICD-10-CM

## 2017-01-16 DIAGNOSIS — E669 Obesity, unspecified: Secondary | ICD-10-CM | POA: Diagnosis not present

## 2017-01-16 DIAGNOSIS — G458 Other transient cerebral ischemic attacks and related syndromes: Secondary | ICD-10-CM

## 2017-01-16 DIAGNOSIS — Z6832 Body mass index (BMI) 32.0-32.9, adult: Secondary | ICD-10-CM | POA: Diagnosis not present

## 2017-01-16 DIAGNOSIS — Z87891 Personal history of nicotine dependence: Secondary | ICD-10-CM | POA: Insufficient documentation

## 2017-01-16 DIAGNOSIS — R911 Solitary pulmonary nodule: Secondary | ICD-10-CM | POA: Diagnosis not present

## 2017-01-16 DIAGNOSIS — G459 Transient cerebral ischemic attack, unspecified: Principal | ICD-10-CM | POA: Insufficient documentation

## 2017-01-16 DIAGNOSIS — Z7982 Long term (current) use of aspirin: Secondary | ICD-10-CM | POA: Insufficient documentation

## 2017-01-16 DIAGNOSIS — E785 Hyperlipidemia, unspecified: Secondary | ICD-10-CM | POA: Diagnosis present

## 2017-01-16 DIAGNOSIS — N3281 Overactive bladder: Secondary | ICD-10-CM | POA: Diagnosis not present

## 2017-01-16 DIAGNOSIS — I10 Essential (primary) hypertension: Secondary | ICD-10-CM | POA: Diagnosis not present

## 2017-01-16 DIAGNOSIS — Z79899 Other long term (current) drug therapy: Secondary | ICD-10-CM | POA: Diagnosis not present

## 2017-01-16 DIAGNOSIS — I6522 Occlusion and stenosis of left carotid artery: Secondary | ICD-10-CM | POA: Diagnosis not present

## 2017-01-16 DIAGNOSIS — R4701 Aphasia: Secondary | ICD-10-CM

## 2017-01-16 DIAGNOSIS — I639 Cerebral infarction, unspecified: Secondary | ICD-10-CM | POA: Diagnosis not present

## 2017-01-16 DIAGNOSIS — H538 Other visual disturbances: Secondary | ICD-10-CM | POA: Diagnosis not present

## 2017-01-16 LAB — COMPREHENSIVE METABOLIC PANEL
ALT: 27 U/L (ref 14–54)
AST: 22 U/L (ref 15–41)
Albumin: 3.9 g/dL (ref 3.5–5.0)
Alkaline Phosphatase: 50 U/L (ref 38–126)
Anion gap: 10 (ref 5–15)
BUN: 14 mg/dL (ref 6–20)
CHLORIDE: 103 mmol/L (ref 101–111)
CO2: 28 mmol/L (ref 22–32)
Calcium: 9.7 mg/dL (ref 8.9–10.3)
Creatinine, Ser: 1.17 mg/dL — ABNORMAL HIGH (ref 0.44–1.00)
GFR, EST AFRICAN AMERICAN: 54 mL/min — AB (ref >60)
GFR, EST NON AFRICAN AMERICAN: 47 mL/min — AB (ref >60)
Glucose, Bld: 100 mg/dL — ABNORMAL HIGH (ref 65–99)
POTASSIUM: 3.6 mmol/L (ref 3.5–5.1)
Sodium: 141 mmol/L (ref 135–145)
Total Bilirubin: 0.5 mg/dL (ref 0.3–1.2)
Total Protein: 7.1 g/dL (ref 6.5–8.1)

## 2017-01-16 LAB — DIFFERENTIAL
BASOS ABS: 0.1 10*3/uL (ref 0.0–0.1)
BASOS PCT: 1 %
EOS ABS: 0.1 10*3/uL (ref 0.0–0.7)
Eosinophils Relative: 2 %
Lymphocytes Relative: 44 %
Lymphs Abs: 3.4 10*3/uL (ref 0.7–4.0)
MONOS PCT: 10 %
Monocytes Absolute: 0.8 10*3/uL (ref 0.1–1.0)
Neutro Abs: 3.2 10*3/uL (ref 1.7–7.7)
Neutrophils Relative %: 43 %

## 2017-01-16 LAB — CBC
HEMATOCRIT: 41.2 % (ref 36.0–46.0)
Hemoglobin: 13.6 g/dL (ref 12.0–15.0)
MCH: 30.1 pg (ref 26.0–34.0)
MCHC: 33 g/dL (ref 30.0–36.0)
MCV: 91.2 fL (ref 78.0–100.0)
PLATELETS: 221 10*3/uL (ref 150–400)
RBC: 4.52 MIL/uL (ref 3.87–5.11)
RDW: 14.7 % (ref 11.5–15.5)
WBC: 7.5 10*3/uL (ref 4.0–10.5)

## 2017-01-16 LAB — I-STAT TROPONIN, ED: TROPONIN I, POC: 0 ng/mL (ref 0.00–0.08)

## 2017-01-16 LAB — I-STAT CHEM 8, ED
BUN: 18 mg/dL (ref 6–20)
CHLORIDE: 100 mmol/L — AB (ref 101–111)
Calcium, Ion: 1.15 mmol/L (ref 1.15–1.40)
Creatinine, Ser: 1.3 mg/dL — ABNORMAL HIGH (ref 0.44–1.00)
GLUCOSE: 98 mg/dL (ref 65–99)
HEMATOCRIT: 40 % (ref 36.0–46.0)
HEMOGLOBIN: 13.6 g/dL (ref 12.0–15.0)
POTASSIUM: 3.9 mmol/L (ref 3.5–5.1)
SODIUM: 142 mmol/L (ref 135–145)
TCO2: 29 mmol/L (ref 0–100)

## 2017-01-16 LAB — PROTIME-INR
INR: 1
PROTHROMBIN TIME: 13.2 s (ref 11.4–15.2)

## 2017-01-16 LAB — CBG MONITORING, ED
GLUCOSE-CAPILLARY: 100 mg/dL — AB (ref 65–99)
Glucose-Capillary: 103 mg/dL — ABNORMAL HIGH (ref 65–99)

## 2017-01-16 LAB — APTT: APTT: 36 s (ref 24–36)

## 2017-01-16 LAB — ETHANOL

## 2017-01-16 MED ORDER — NICOTINE POLACRILEX 2 MG MT LOZG: 2.0000 mg | LOZENGE | OROMUCOSAL | Status: DC | PRN

## 2017-01-16 MED ORDER — LORAZEPAM 1 MG PO TABS
1.0000 mg | ORAL_TABLET | Freq: Once | ORAL | Status: AC
  Administered 2017-01-16: 1 mg via ORAL
  Filled 2017-01-16: qty 1

## 2017-01-16 MED ORDER — ACETAMINOPHEN 325 MG PO TABS: 650.0000 mg | ORAL_TABLET | ORAL | Status: DC | PRN

## 2017-01-16 MED ORDER — ALUM & MAG HYDROXIDE-SIMETH 200-200-20 MG/5ML PO SUSP
30.0000 mL | Freq: Four times a day (QID) | ORAL | Status: DC | PRN
  Administered 2017-01-16: 30 mL via ORAL
  Filled 2017-01-16: qty 30

## 2017-01-16 MED ORDER — ACETAMINOPHEN 160 MG/5ML PO SOLN
650.0000 mg | ORAL | Status: DC | PRN
Start: 2017-01-16 — End: 2017-01-17

## 2017-01-16 MED ORDER — ATORVASTATIN CALCIUM 40 MG PO TABS: 40.0000 mg | ORAL_TABLET | Freq: Every day | ORAL | Status: DC

## 2017-01-16 MED ORDER — ACETAMINOPHEN 650 MG RE SUPP: 650.0000 mg | RECTAL | Status: DC | PRN

## 2017-01-16 MED ORDER — STROKE: EARLY STAGES OF RECOVERY BOOK
Freq: Once | Status: AC
  Administered 2017-01-16: 23:00:00

## 2017-01-16 MED ORDER — OXYBUTYNIN CHLORIDE ER 5 MG PO TB24
5.0000 mg | ORAL_TABLET | Freq: Every day | ORAL | Status: DC
  Administered 2017-01-17: 5 mg via ORAL
  Filled 2017-01-16 (×2): qty 1

## 2017-01-16 MED ORDER — DIPHENHYDRAMINE HCL 25 MG PO CAPS: 25.0000 mg | ORAL_CAPSULE | Freq: Every evening | ORAL | Status: DC | PRN

## 2017-01-16 MED ORDER — ASPIRIN 325 MG PO TABS
325.0000 mg | ORAL_TABLET | Freq: Every day | ORAL | Status: DC
Start: 2017-01-17 — End: 2017-01-17
  Administered 2017-01-17: 325 mg via ORAL
  Filled 2017-01-16: qty 1

## 2017-01-16 MED ORDER — ENOXAPARIN SODIUM 40 MG/0.4ML ~~LOC~~ SOLN
40.0000 mg | SUBCUTANEOUS | Status: DC
  Administered 2017-01-16: 40 mg via SUBCUTANEOUS
  Filled 2017-01-16: qty 0.4

## 2017-01-16 MED ORDER — SODIUM CHLORIDE 0.9% FLUSH
3.0000 mL | Freq: Two times a day (BID) | INTRAVENOUS | Status: DC
  Administered 2017-01-16: 3 mL via INTRAVENOUS

## 2017-01-16 NOTE — Code Documentation (Signed)
71 y.o. Female w/ PMHx of CVA in 9/17 w/ loop recorder placed, HTN and former tobacco use. Pt presents to [facility] ED via POV. Pt was in her normal state of health today. She states she took a nap in her car today while she was waiting to pick kids up from school. After about a 30 min nap, she states she woke up with blurry vision and "trouble getting words out". On arrival pt was speaking clearly with no issues, however endorsed blurry vision. She stated this was also improving. No focal deficits assessed. Pt reports metoprolol dose was doubled about a week ago and she started taking valsartan yesterday. CT (-) ASPECTS 10. NIHSS 0. See EMR for code stroke times and NIHSS. tPA not given d/t symptoms resolving. Bedside handoff w/ ED RN Donella Stade

## 2017-01-16 NOTE — H&P (Signed)
History and Physical    Kathleen Guerrero VZS:[id number] DOB: 07/10/1946 DOA: 01/16/2017  PCP: Mauricio Po, FNP   Patient coming from: School parking lot.  I have personally briefly reviewed patient's old medical records in [place]  Chief Complaint: Slurred speech.  HPI: Kathleen Guerrero is a 71 y.o. female with medical history significant of hypertension, CVA who was waiting for her grandkids inside her vehicle in the school parking lot at around 1400. She says that she fell asleep and woke up about 1445 with slurred speech and blurred vision. She subsequently drove the kids home, noticed that her speech was improved, but still have blurred vision. She may have had a mild right facial droop on arrival per ED nursing staff, which is not noticeable at this time and the patient was unaware of. She denies headache, dizziness, double vision, extremity numbness or weakness.  ED Course: Her CBC was unremarkable. Her BMP showed a creatinine of 1.17 and glucose of 100 mg/dL, but otherwise was within normal range. Alcohol level was negative. CT scan of the head and MRI/MRA of the brain did not show any acute abnormality. Please see full radiology report for further details.  Review of Systems: As per HPI otherwise 10 point review of systems negative.    Past Medical History:  Diagnosis Date  . Hypertension   . Stroke ([place])   . Tobacco use     Past Surgical History:  Procedure Laterality Date  . DERMOID CYST  EXCISION    . EP IMPLANTABLE DEVICE N/A 07/23/2016   Procedure: Loop Recorder Insertion;  Surgeon: Thompson Grayer, MD;  Location: [facility];  Service: Cardiovascular;  Laterality: N/A;  . Intestinal obstruction    . TEE WITHOUT CARDIOVERSION N/A 07/23/2016   Procedure: TRANSESOPHAGEAL ECHOCARDIOGRAM (TEE);  Surgeon: Lelon Perla, MD;  Location: [facility] ENDOSCOPY;  Service: Cardiovascular;  Laterality: N/A;     reports that she quit smoking about 5 months ago. Her smoking  use included Cigarettes. She has a 50.00 pack-year smoking history. She has never used smokeless tobacco. She reports that she drinks alcohol. She reports that she does not use drugs.  No Known Allergies  Family History  Problem Relation Age of Onset  . Kidney disease Maternal Grandmother   . Cancer Maternal Grandfather     Prior to Admission medications   Medication Sig Start Date End Date Taking? Authorizing Provider  aspirin 325 MG tablet TAKE 1 TABLET BY MOUTH EVERY DAY 10/24/16   Golden Circle, FNP  atorvastatin (LIPITOR) 40 MG tablet TAKE 1 TABLET BY MOUTH EVERY DAY at 6 p.m. 10/24/16   Golden Circle, FNP  diphenhydrAMINE (BENADRYL) 25 MG tablet Take 25 mg by mouth at bedtime as needed for sleep.    Historical Provider, MD  metoprolol (LOPRESSOR) 50 MG tablet Take 1 tablet (50 mg total) by mouth 2 (two) times daily. 01/15/17   Biagio Borg, MD  nicotine polacrilex ([facility] NICOTINE) 2 MG lozenge Take 2 mg by mouth as needed for smoking cessation.    Historical Provider, MD  oxybutynin (DITROPAN-XL) 5 MG 24 hr tablet TAKE 1 TABLET BY MOUTH AT BEDTIME 11/22/16   Golden Circle, FNP  valsartan (DIOVAN) 160 MG tablet Take 1 tablet (160 mg total) by mouth daily. 01/15/17   Biagio Borg, MD    Physical Exam:  Constitutional: NAD, calm, comfortable Vitals:   01/16/17 1830 01/16/17 1845 01/16/17 1858 01/16/17 2058  BP: 132/67 (!) 151/79  130/80  Pulse: (!) 57 (!) 53  62  Resp: 12 13    Temp:   98.4 F (36.9 C) 98.1 F (36.7 C)  TempSrc:    Oral  SpO2: 95% 97%  96%   Eyes: PERRL, lids and conjunctivae normal ENMT: Mucous membranes are moist. Posterior pharynx clear of any exudate or lesions. Neck: normal, supple, no masses, no thyromegaly Respiratory: clear to auscultation bilaterally, no wheezing, no crackles. Normal respiratory effort. No accessory muscle use.  Cardiovascular: Regular rate and rhythm, no murmurs / rubs / gallops. No extremity edema. 2+ pedal pulses. No carotid  bruits.  Abdomen: no tenderness, no masses palpated. No hepatosplenomegaly. Bowel sounds positive.  Musculoskeletal: no clubbing / cyanosis.  Good ROM, no contractures. Normal muscle tone.  Skin: no rashes, lesions, ulcers. No induration Neurologic: CN 2-12 grossly intact. Sensation intact, DTR normal. Strength 5/5 in all 4.  Psychiatric: Normal judgment and insight. Alert and oriented x 3. Normal mood.    Labs on Admission: I have personally reviewed following labs and imaging studies  CBC:  Recent Labs Lab 01/16/17 1743 01/16/17 1751  WBC 7.5  --   NEUTROABS 3.2  --   HGB 13.6 13.6  HCT 41.2 40.0  MCV 91.2  --   PLT 221  --    Basic Metabolic Panel:  Recent Labs Lab 01/16/17 1743 01/16/17 1751  NA 141 142  K 3.6 3.9  CL 103 100*  CO2 28  --   GLUCOSE 100* 98  BUN 14 18  CREATININE 1.17* 1.30*  CALCIUM 9.7  --    GFR: Estimated Creatinine Clearance: 40.4 mL/min (A) (by C-G formula based on SCr of 1.3 mg/dL (H)). Liver Function Tests:  Recent Labs Lab 01/16/17 1743  AST 22  ALT 27  ALKPHOS 50  BILITOT 0.5  PROT 7.1  ALBUMIN 3.9   No results for input(s): LIPASE, AMYLASE in the last 168 hours. No results for input(s): AMMONIA in the last 168 hours. Coagulation Profile:  Recent Labs Lab 01/16/17 1743  INR 1.00   Cardiac Enzymes: No results for input(s): CKTOTAL, CKMB, CKMBINDEX, TROPONINI in the last 168 hours. BNP (last 3 results) No results for input(s): PROBNP in the last 8760 hours. HbA1C: No results for input(s): HGBA1C in the last 72 hours. CBG:  Recent Labs Lab 01/16/17 1742 01/16/17 1806  GLUCAP 100* 103*   Lipid Profile: No results for input(s): CHOL, HDL, LDLCALC, TRIG, CHOLHDL, LDLDIRECT in the last 72 hours. Thyroid Function Tests: No results for input(s): TSH, T4TOTAL, FREET4, T3FREE, THYROIDAB in the last 72 hours. Anemia Panel: No results for input(s): VITAMINB12, FOLATE, FERRITIN, TIBC, IRON, RETICCTPCT in the last 72  hours. Urine analysis: No results found for: COLORURINE, APPEARANCEUR, LABSPEC, [place], GLUCOSEU, HGBUR, BILIRUBINUR, KETONESUR, PROTEINUR, UROBILINOGEN, NITRITE, LEUKOCYTESUR  Radiological Exams on Admission: Mr Virgel Paling JJ Contrast  Result Date: 01/16/2017 CLINICAL DATA:  Initial evaluation for acute speech difficulty, now resolved. Blurry vision in right eye. EXAM: MRI HEAD WITHOUT CONTRAST MRA HEAD WITHOUT CONTRAST TECHNIQUE: Multiplanar, multiecho pulse sequences of the brain and surrounding structures were obtained without intravenous contrast. Angiographic images of the head were obtained using MRA technique without contrast. COMPARISON:  Prior CT from earlier the same day. FINDINGS: MRI HEAD FINDINGS Brain: Diffuse prominence of the CSF containing spaces is compatible with generalized cerebral atrophy. Patchy and confluent T2/FLAIR hyperintensity within the periventricular and deep white matter both cerebral hemispheres most compatible chronic microvascular ischemic disease, moderate nature. Superimposed remote lacunar infarct  present within the deep/subcortical white matter of the posterior right centrum semi ovale (series 8, image 19). No abnormal foci of restricted diffusion to suggest acute ischemic infarct. Gray-white matter differentiation maintained. No susceptibility artifact to suggest acute or chronic intracranial hemorrhage. No mass lesion, midline shift or mass effect. No hydrocephalus. No extra-axial fluid collection. Major dural sinuses are grossly patent. Pituitary gland and suprasellar region within normal limits. Vascular: Major intracranial vascular flow voids maintained. Skull and upper cervical spine: Craniocervical junction within normal limits. Visualized upper cervical spine unremarkable. Bone marrow signal intensity within normal limits. 2 cm well-circumscribed soft tissue nodule at the posterior scalp, likely a sebaceous cyst. Scalp soft tissues otherwise unremarkable.  Sinuses/Orbits: Globes and orbital soft tissues grossly unremarkable, although evaluation mildly limited by motion. Paranasal sinuses are clear. No mastoid effusion. Inner ear structures normal. Other: None. MRA HEAD FINDINGS ANTERIOR CIRCULATION: Distal cervical segments of the internal carotid arteries are patent with antegrade flow. Petrous segments widely patent. Atheromatous irregularity with mild to moderate narrowing present within the cavernous left ICA and (series 4, image 71). Cavernous right ICA widely patent. ICA termini patent. A1 segments well opacified. Anterior communicating artery within normal limits. Anterior cerebral arteries patent to their distal aspects. M1 segments widely patent without stenosis or occlusion. Distal MCA branches well opacified and symmetric. Distal small vessel atheromatous irregularity. POSTERIOR CIRCULATION: Vertebral arteries patent to the vertebrobasilar junction. Right vertebral artery tortuous crossing the midline prior to the vertebrobasilar junction. Posterior inferior cerebral arteries patent proximally. The basilar artery mildly tortuous but widely patent to its distal aspect. Superior cerebral arteries patent bilaterally. Both of the posterior cerebral arteries supplied mainly via the basilar artery and are widely patent to their distal aspects. No aneurysm or vascular malformation. IMPRESSION: MRI HEAD IMPRESSION: 1. No acute intracranial infarct or other process identified. 2. Moderate chronic microvascular ischemic disease. MRA HEAD IMPRESSION: 1. Mild to moderate atheromatous narrowing within the cavernous left ICA. No other high-grade or correctable stenosis within the intracranial circulation. 2. Distal small vessel atheromatous irregularity throughout the intracranial circulation. Electronically Signed   By: Jeannine Boga M.D.   On: 01/16/2017 20:31   Mr Brain Wo Contrast  Result Date: 01/16/2017 CLINICAL DATA:  Initial evaluation for acute speech  difficulty, now resolved. Blurry vision in right eye. EXAM: MRI HEAD WITHOUT CONTRAST MRA HEAD WITHOUT CONTRAST TECHNIQUE: Multiplanar, multiecho pulse sequences of the brain and surrounding structures were obtained without intravenous contrast. Angiographic images of the head were obtained using MRA technique without contrast. COMPARISON:  Prior CT from earlier the same day. FINDINGS: MRI HEAD FINDINGS Brain: Diffuse prominence of the CSF containing spaces is compatible with generalized cerebral atrophy. Patchy and confluent T2/FLAIR hyperintensity within the periventricular and deep white matter both cerebral hemispheres most compatible chronic microvascular ischemic disease, moderate nature. Superimposed remote lacunar infarct present within the deep/subcortical white matter of the posterior right centrum semi ovale (series 8, image 19). No abnormal foci of restricted diffusion to suggest acute ischemic infarct. Gray-white matter differentiation maintained. No susceptibility artifact to suggest acute or chronic intracranial hemorrhage. No mass lesion, midline shift or mass effect. No hydrocephalus. No extra-axial fluid collection. Major dural sinuses are grossly patent. Pituitary gland and suprasellar region within normal limits. Vascular: Major intracranial vascular flow voids maintained. Skull and upper cervical spine: Craniocervical junction within normal limits. Visualized upper cervical spine unremarkable. Bone marrow signal intensity within normal limits. 2 cm well-circumscribed soft tissue nodule at the posterior scalp, likely a sebaceous cyst. Scalp  soft tissues otherwise unremarkable. Sinuses/Orbits: Globes and orbital soft tissues grossly unremarkable, although evaluation mildly limited by motion. Paranasal sinuses are clear. No mastoid effusion. Inner ear structures normal. Other: None. MRA HEAD FINDINGS ANTERIOR CIRCULATION: Distal cervical segments of the internal carotid arteries are patent with  antegrade flow. Petrous segments widely patent. Atheromatous irregularity with mild to moderate narrowing present within the cavernous left ICA and (series 4, image 71). Cavernous right ICA widely patent. ICA termini patent. A1 segments well opacified. Anterior communicating artery within normal limits. Anterior cerebral arteries patent to their distal aspects. M1 segments widely patent without stenosis or occlusion. Distal MCA branches well opacified and symmetric. Distal small vessel atheromatous irregularity. POSTERIOR CIRCULATION: Vertebral arteries patent to the vertebrobasilar junction. Right vertebral artery tortuous crossing the midline prior to the vertebrobasilar junction. Posterior inferior cerebral arteries patent proximally. The basilar artery mildly tortuous but widely patent to its distal aspect. Superior cerebral arteries patent bilaterally. Both of the posterior cerebral arteries supplied mainly via the basilar artery and are widely patent to their distal aspects. No aneurysm or vascular malformation. IMPRESSION: MRI HEAD IMPRESSION: 1. No acute intracranial infarct or other process identified. 2. Moderate chronic microvascular ischemic disease. MRA HEAD IMPRESSION: 1. Mild to moderate atheromatous narrowing within the cavernous left ICA. No other high-grade or correctable stenosis within the intracranial circulation. 2. Distal small vessel atheromatous irregularity throughout the intracranial circulation. Electronically Signed   By: Jeannine Boga M.D.   On: 01/16/2017 20:31   Ct Head Code Stroke Wo Contrast  Result Date: 01/16/2017 CLINICAL DATA:  Code stroke. EXAM: CT HEAD WITHOUT CONTRAST TECHNIQUE: Contiguous axial images were obtained from the base of the skull through the vertex without intravenous contrast. COMPARISON:  07/20/2016 CT head FINDINGS: Brain: No evidence of acute infarction, hemorrhage, hydrocephalus, extra-axial collection or mass lesion/mass effect. Stable  mild-to-moderate chronic microvascular ischemic changes and mild brain parenchymal volume loss. Vascular: Moderate calcific atherosclerosis of cavernous and paraclinoid internal carotid arteries. Skull: Midline parietal scalp lesion measuring 13 x 22 mm with calcifications, probably a calcified dermal cyst there are no displaced calvarial fracture. Sinuses/Orbits: No acute finding. Other: None. ASPECTS [facility] Stroke Program Early CT Score) - Ganglionic level infarction (caudate, lentiform nuclei, internal capsule, insula, M1-M3 cortex): 7 - Supraganglionic infarction (M4-M6 cortex): 3 Total score (0-10 with 10 being normal): 10 IMPRESSION: 1. No acute intracranial abnormality identified. 2. Mild-to-moderate chronic microvascular ischemic changes and mild parenchymal volume loss of the brain. 3. ASPECTS is 10 4. Stable midline parietal scalp lesion, likely a calcified dermal cyst. These results were called by telephone at the time of interpretation on 01/16/2017 at 6:08 pm to Dr. Sallyanne Havers , who verbally acknowledged these results. Electronically Signed   By: Kristine Garbe M.D.   On: 01/16/2017 18:11  07/23/2016 TEE  ------------------------------------------------------------------- LV EF: 55% -   60%  ------------------------------------------------------------------- Indications:      CVA 436.  ------------------------------------------------------------------- History:   PMH:  Essential hypertension. Lung nodule.  ------------------------------------------------------------------- Study Conclusions  - Left ventricle: Systolic function was normal. The estimated   ejection fraction was in the range of 55% to 60%. Wall motion was   normal; there were no regional wall motion abnormalities. - Aortic valve: No evidence of vegetation. - Mitral valve: No evidence of vegetation. - Left atrium: No evidence of thrombus in the atrial cavity or   appendage. - Right atrium: No evidence of  thrombus in the atrial cavity or   appendage. - Atrial septum: No defect or  patent foramen ovale was identified. - Tricuspid valve: No evidence of vegetation. - Pulmonic valve: No evidence of vegetation.  Impressions:  - Normal LV systolic function; moderate atherosclerosis descending   aorta; negative saline microcavitation study.   Echocardiogram 07/22/2016 ------------------------------------------------------------------- LV EF: 60% -   65%  ------------------------------------------------------------------- Indications:      CVA 436.  ------------------------------------------------------------------- History:   Risk factors:  Current tobacco use. Hypertension.  ------------------------------------------------------------------- Study Conclusions  - Left ventricle: The cavity size was normal. Systolic function was   normal. The estimated ejection fraction was in the range of 60%   to 65%. Wall motion was normal; there were no regional wall   motion abnormalities. Features are consistent with a pseudonormal   left ventricular filling pattern, with concomitant abnormal   relaxation and increased filling pressure (grade 2 diastolic   dysfunction). Septal E/E&' is 10 and average 9 c/w indeterminant   LV filling pressures    EKG: Independently reviewed. Vent. rate 67 BPM PR interval 130 ms QRS duration 68 ms QT/QTc 396/418 ms P-R-T axes 33 14 12 Normal sinus rhythm Normal ECG  Assessment/Plan Principal Problem:   TIA (transient ischemic attack) Admit to telemetry unit/telemetry. Frequent neuro checks. Check fasting lipids and hemoglobin A1c in a.m. Check echocardiogram and carotid Doppler in a.m. Continue aspirin 325 mg by mouth daily. Neurology is considering adding antiplatelet agent.  Active Problems:   Essential hypertension Continue metoprolol 50 mg by mouth twice a day    Solitary pulmonary nodule The patient is following with pulmonology on a  regular basis.    Overactive bladder Continue Ditropan XL 5 mg by mouth every 24 hours of bedtime.    Dyslipidemia, goal LDL below 70 D atorvastatin 40 mg by mouth daily. Monitor LFTs and fasting lipids periodically.     DVT prophylaxis: Lovenox SQ. Code Status: Full code. Family Communication:  Disposition Plan: Admit for TIA workup. Consults called: Neurology (Dr. Silvana Newness) Admission status: Observation/telemetry.   Reubin Milan MD [facility] Pager [phone]  If 7PM-7AM, please contact night-coverage [email or web address] Password [email or web address]  01/16/2017, 10:52 PM

## 2017-01-16 NOTE — ED Provider Notes (Signed)
Emergency Department Provider Note   I have reviewed the triage vital signs and the nursing notes.   HISTORY  Chief Complaint Blurred Vision   HPI Kathleen Guerrero is a 71 y.o. female patient resents to the emergency room in for evaluation of strokelike symptoms. Last known normal 2 PM. She reports waking up from a nap and having slurred speech, word finding difficulty, blurred vision. Symptoms are largely resolving. She still notes some mild blurred vision and some mild speech changes. No numbness or weakness in the arms or legs. No difficulty walking. No headaches. No modifying factors. No radiating symptoms. Severity is moderate and improving. Did not start a new medication recently. She does have a history of stroke in the past.   Past Medical History:  Diagnosis Date  . Hypertension   . Stroke ([place])   . Tobacco use     Patient Active Problem List   Diagnosis Date Noted  . TIA (transient ischemic attack) 01/16/2017  . Dyslipidemia, goal LDL below 70 01/03/2017  . Diarrhea 09/12/2016  . Sleep disturbance 07/26/2016  . Overactive bladder 07/26/2016  . Tobacco abuse 07/21/2016  . Solitary pulmonary nodule 07/21/2016  . Acute CVA (cerebrovascular accident) ([place]) 07/21/2016  . Acute lacunar stroke ([place]) 07/20/2016  . Hypokalemia 07/20/2016  . Essential hypertension 07/20/2016    Past Surgical History:  Procedure Laterality Date  . DERMOID CYST  EXCISION    . EP IMPLANTABLE DEVICE N/A 07/23/2016   Procedure: Loop Recorder Insertion;  Surgeon: Thompson Grayer, MD;  Location: [place] [facility];  Service: Cardiovascular;  Laterality: N/A;  . Intestinal obstruction    . TEE WITHOUT CARDIOVERSION N/A 07/23/2016   Procedure: TRANSESOPHAGEAL ECHOCARDIOGRAM (TEE);  Surgeon: Lelon Perla, MD;  Location[facility] ENDOSCOPY;  Service: Cardiovascular;  Laterality: N/A;      Allergies Patient has no known allergies.  Family History  Problem Relation Age of Onset  . Kidney disease  Maternal Grandmother   . Cancer Maternal Grandfather     Social History Social History  Substance Use Topics  . Smoking status: Former Smoker    Packs/day: 1.00    Years: 50.00    Types: Cigarettes    Quit date: 07/20/2016  . Smokeless tobacco: Never Used  . Alcohol use Yes     Comment: Rarely    Review of Systems  Constitutional: No fever/chills Eyes: Positive visual changes. ENT: No sore throat. Cardiovascular: Denies chest pain. Respiratory: Denies shortness of breath. Gastrointestinal: No abdominal pain.  No nausea, no vomiting.  No diarrhea.  No constipation. Genitourinary: Negative for dysuria. Musculoskeletal: Negative for back pain. Skin: Negative for rash. Neurological: Negative for headaches, focal weakness or numbness. Positive speech change.   10-point ROS otherwise negative.  ____________________________________________   PHYSICAL EXAM:  VITAL SIGNS: ED Triage Vitals [01/16/17 1739]  Enc Vitals Group     BP (!) 146/63     Pulse Rate 67     Resp 18     Temp 99.1 F (37.3 C)     Temp Source Oral     SpO2 100 %   Constitutional: Alert and oriented. Well appearing and in no acute distress. Eyes: Conjunctivae are normal. PERRL. EOMI. Head: Atraumatic. Nose: No congestion/rhinnorhea. Mouth/Throat: Mucous membranes are moist.   Neck: No stridor.   Cardiovascular: Normal rate, regular rhythm. Good peripheral circulation. Grossly normal heart sounds.   Respiratory: Normal respiratory effort.  No retractions. Lungs CTAB. Gastrointestinal: Soft and nontender. No distention.  Musculoskeletal: No lower  extremity tenderness nor edema. No gross deformities of extremities. Neurologic:  Normal speech and language. No gross focal neurologic deficits are appreciated. Mild slurred speech.  Skin:  Skin is warm, dry and intact. No rash noted. Psychiatric: Mood and affect are normal. Speech and behavior are normal.  ____________________________________________     LABS (all labs ordered are listed, but only abnormal results are displayed)  Labs Reviewed  COMPREHENSIVE METABOLIC PANEL - Abnormal; Notable for the following:       Result Value   Glucose, Bld 100 (*)    Creatinine, Ser 1.17 (*)    GFR calc non Af Amer 47 (*)    GFR calc Af Amer 54 (*)    All other components within normal limits  LIPID PANEL - Abnormal; Notable for the following:    HDL 40 (*)    All other components within normal limits  CBG MONITORING, ED - Abnormal; Notable for the following:    Glucose-Capillary 100 (*)    All other components within normal limits  I-STAT CHEM 8, ED - Abnormal; Notable for the following:    Chloride 100 (*)    Creatinine, Ser 1.30 (*)    All other components within normal limits  CBG MONITORING, ED - Abnormal; Notable for the following:    Glucose-Capillary 103 (*)    All other components within normal limits  ETHANOL  PROTIME-INR  APTT  CBC  DIFFERENTIAL  RAPID URINE DRUG SCREEN, HOSP PERFORMED  URINALYSIS, ROUTINE W REFLEX MICROSCOPIC  HEMOGLOBIN A1C  I-STAT TROPOININ, ED   ____________________________________________  EKG   EKG Interpretation  Date/Time:  Thursday January 16 2017 17:37:59 EDT Ventricular Rate:  67 PR Interval:  130 QRS Duration: 68 QT Interval:  396 QTC Calculation: 418 R Axis:   14 Text Interpretation:  Normal sinus rhythm Normal ECG When compared with ECG of 07/20/2016, No significant change was found Confirmed by [facility]  MD, DAVID ([id number]) on 01/16/2017 6:20:36 PM Also confirmed by Roxanne Mins  MD, DAVID ([id number]), editor [place] CT, Leda Gauze [phone])  on 01/17/2017 7:14:56 AM       ____________________________________________  RADIOLOGY  Mr Kathleen Guerrero Head Wo Contrast  Result Date: 01/16/2017 CLINICAL DATA:  Initial evaluation for acute speech difficulty, now resolved. Blurry vision in right eye. EXAM: MRI HEAD WITHOUT CONTRAST MRA HEAD WITHOUT CONTRAST TECHNIQUE: Multiplanar, multiecho pulse sequences of the brain and  surrounding structures were obtained without intravenous contrast. Angiographic images of the head were obtained using MRA technique without contrast. COMPARISON:  Prior CT from earlier the same day. FINDINGS: MRI HEAD FINDINGS Brain: Diffuse prominence of the CSF containing spaces is compatible with generalized cerebral atrophy. Patchy and confluent T2/FLAIR hyperintensity within the periventricular and deep white matter both cerebral hemispheres most compatible chronic microvascular ischemic disease, moderate nature. Superimposed remote lacunar infarct present within the deep/subcortical white matter of the posterior right centrum semi ovale (series 8, image 19). No abnormal foci of restricted diffusion to suggest acute ischemic infarct. Gray-white matter differentiation maintained. No susceptibility artifact to suggest acute or chronic intracranial hemorrhage. No mass lesion, midline shift or mass effect. No hydrocephalus. No extra-axial fluid collection. Major dural sinuses are grossly patent. Pituitary gland and suprasellar region within normal limits. Vascular: Major intracranial vascular flow voids maintained. Skull and upper cervical spine: Craniocervical junction within normal limits. Visualized upper cervical spine unremarkable. Bone marrow signal intensity within normal limits. 2 cm well-circumscribed soft tissue nodule at the posterior scalp, likely a sebaceous cyst. Scalp soft tissues otherwise unremarkable. Sinuses/Orbits:  Globes and orbital soft tissues grossly unremarkable, although evaluation mildly limited by motion. Paranasal sinuses are clear. No mastoid effusion. Inner ear structures normal. Other: None. MRA HEAD FINDINGS ANTERIOR CIRCULATION: Distal cervical segments of the internal carotid arteries are patent with antegrade flow. Petrous segments widely patent. Atheromatous irregularity with mild to moderate narrowing present within the cavernous left ICA and (series 4, image 71). Cavernous  right ICA widely patent. ICA termini patent. A1 segments well opacified. Anterior communicating artery within normal limits. Anterior cerebral arteries patent to their distal aspects. M1 segments widely patent without stenosis or occlusion. Distal MCA branches well opacified and symmetric. Distal small vessel atheromatous irregularity. POSTERIOR CIRCULATION: Vertebral arteries patent to the vertebrobasilar junction. Right vertebral artery tortuous crossing the midline prior to the vertebrobasilar junction. Posterior inferior cerebral arteries patent proximally. The basilar artery mildly tortuous but widely patent to its distal aspect. Superior cerebral arteries patent bilaterally. Both of the posterior cerebral arteries supplied mainly via the basilar artery and are widely patent to their distal aspects. No aneurysm or vascular malformation. IMPRESSION: MRI HEAD IMPRESSION: 1. No acute intracranial infarct or other process identified. 2. Moderate chronic microvascular ischemic disease. MRA HEAD IMPRESSION: 1. Mild to moderate atheromatous narrowing within the cavernous left ICA. No other high-grade or correctable stenosis within the intracranial circulation. 2. Distal small vessel atheromatous irregularity throughout the intracranial circulation. Electronically Signed   By: Jeannine Boga M.D.   On: 01/16/2017 20:31   Mr Brain Wo Contrast  Result Date: 01/16/2017 CLINICAL DATA:  Initial evaluation for acute speech difficulty, now resolved. Blurry vision in right eye. EXAM: MRI HEAD WITHOUT CONTRAST MRA HEAD WITHOUT CONTRAST TECHNIQUE: Multiplanar, multiecho pulse sequences of the brain and surrounding structures were obtained without intravenous contrast. Angiographic images of the head were obtained using MRA technique without contrast. COMPARISON:  Prior CT from earlier the same day. FINDINGS: MRI HEAD FINDINGS Brain: Diffuse prominence of the CSF containing spaces is compatible with generalized cerebral  atrophy. Patchy and confluent T2/FLAIR hyperintensity within the periventricular and deep white matter both cerebral hemispheres most compatible chronic microvascular ischemic disease, moderate nature. Superimposed remote lacunar infarct present within the deep/subcortical white matter of the posterior right centrum semi ovale (series 8, image 19). No abnormal foci of restricted diffusion to suggest acute ischemic infarct. Gray-white matter differentiation maintained. No susceptibility artifact to suggest acute or chronic intracranial hemorrhage. No mass lesion, midline shift or mass effect. No hydrocephalus. No extra-axial fluid collection. Major dural sinuses are grossly patent. Pituitary gland and suprasellar region within normal limits. Vascular: Major intracranial vascular flow voids maintained. Skull and upper cervical spine: Craniocervical junction within normal limits. Visualized upper cervical spine unremarkable. Bone marrow signal intensity within normal limits. 2 cm well-circumscribed soft tissue nodule at the posterior scalp, likely a sebaceous cyst. Scalp soft tissues otherwise unremarkable. Sinuses/Orbits: Globes and orbital soft tissues grossly unremarkable, although evaluation mildly limited by motion. Paranasal sinuses are clear. No mastoid effusion. Inner ear structures normal. Other: None. MRA HEAD FINDINGS ANTERIOR CIRCULATION: Distal cervical segments of the internal carotid arteries are patent with antegrade flow. Petrous segments widely patent. Atheromatous irregularity with mild to moderate narrowing present within the cavernous left ICA and (series 4, image 71). Cavernous right ICA widely patent. ICA termini patent. A1 segments well opacified. Anterior communicating artery within normal limits. Anterior cerebral arteries patent to their distal aspects. M1 segments widely patent without stenosis or occlusion. Distal MCA branches well opacified and symmetric. Distal small vessel atheromatous  irregularity. POSTERIOR  CIRCULATION: Vertebral arteries patent to the vertebrobasilar junction. Right vertebral artery tortuous crossing the midline prior to the vertebrobasilar junction. Posterior inferior cerebral arteries patent proximally. The basilar artery mildly tortuous but widely patent to its distal aspect. Superior cerebral arteries patent bilaterally. Both of the posterior cerebral arteries supplied mainly via the basilar artery and are widely patent to their distal aspects. No aneurysm or vascular malformation. IMPRESSION: MRI HEAD IMPRESSION: 1. No acute intracranial infarct or other process identified. 2. Moderate chronic microvascular ischemic disease. MRA HEAD IMPRESSION: 1. Mild to moderate atheromatous narrowing within the cavernous left ICA. No other high-grade or correctable stenosis within the intracranial circulation. 2. Distal small vessel atheromatous irregularity throughout the intracranial circulation. Electronically Signed   By: Jeannine Boga M.D.   On: 01/16/2017 20:31   Ct Head Code Stroke Wo Contrast  Result Date: 01/16/2017 CLINICAL DATA:  Code stroke. EXAM: CT HEAD WITHOUT CONTRAST TECHNIQUE: Contiguous axial images were obtained from the base of the skull through the vertex without intravenous contrast. COMPARISON:  07/20/2016 CT head FINDINGS: Brain: No evidence of acute infarction, hemorrhage, hydrocephalus, extra-axial collection or mass lesion/mass effect. Stable mild-to-moderate chronic microvascular ischemic changes and mild brain parenchymal volume loss. Vascular: Moderate calcific atherosclerosis of cavernous and paraclinoid internal carotid arteries. Skull: Midline parietal scalp lesion measuring 13 x 22 mm with calcifications, probably a calcified dermal cyst there are no displaced calvarial fracture. Sinuses/Orbits: No acute finding. Other: None. ASPECTS [facility] Stroke Program Early CT Score) - Ganglionic level infarction (caudate, lentiform nuclei, internal  capsule, insula, M1-M3 cortex): 7 - Supraganglionic infarction (M4-M6 cortex): 3 Total score (0-10 with 10 being normal): 10 IMPRESSION: 1. No acute intracranial abnormality identified. 2. Mild-to-moderate chronic microvascular ischemic changes and mild parenchymal volume loss of the brain. 3. ASPECTS is 10 4. Stable midline parietal scalp lesion, likely a calcified dermal cyst. These results were called by telephone at the time of interpretation on 01/16/2017 at 6:08 pm to Dr. Sallyanne Havers , who verbally acknowledged these results. Electronically Signed   By: Kristine Garbe M.D.   On: 01/16/2017 18:11    ____________________________________________   PROCEDURES  Procedure(s) performed:   Procedures  None ____________________________________________   INITIAL IMPRESSION / ASSESSMENT AND PLAN / ED COURSE  Pertinent labs & imaging results that were available during my care of the patient were reviewed by me and considered in my medical decision making (see chart for details).  Code stroke activated from triage. Patient airway is patent. Symptoms seem to be resolving. Will sent for CT draw labs. EKG is unremarkable. With resolving symptoms feel that tPA will not be indicated.   06:27 PM Neurology is seeing the patient. Recommends admission for TIA workup. No tPA.   06:35 PM Discussed patient's case with hospitalist, Dr. Olevia Bowens. Patient and family (if present) updated with plan. Care transferred to hospitalist service.  I reviewed all nursing notes, vitals, pertinent old records, EKGs, labs, imaging (as available).   ____________________________________________  FINAL CLINICAL IMPRESSION(S) / ED DIAGNOSES  Final diagnoses:  Other specified transient cerebral ischemias     MEDICATIONS GIVEN DURING THIS VISIT:  Medications  acetaminophen (TYLENOL) tablet 650 mg (not administered)    Or  acetaminophen (TYLENOL) solution 650 mg (not administered)    Or  acetaminophen  (TYLENOL) suppository 650 mg (not administered)  enoxaparin (LOVENOX) injection 40 mg (40 mg Subcutaneous Given 01/16/17 2239)  sodium chloride flush (NS) 0.9 % injection 3 mL (3 mLs Intravenous Not Given 01/17/17 1000)  aspirin tablet 325  mg (325 mg Oral Given 01/17/17 0816)  atorvastatin (LIPITOR) tablet 40 mg (not administered)  diphenhydrAMINE (BENADRYL) capsule 25 mg (not administered)  nicotine polacrilex (COMMIT) lozenge 2 mg (not administered)  oxybutynin (DITROPAN-XL) 24 hr tablet 5 mg (5 mg Oral Given 01/17/17 0057)  alum & mag hydroxide-simeth (MAALOX/MYLANTA) 200-200-20 MG/5ML suspension 30 mL (30 mLs Oral Given by Other 01/16/17 2359)  LORazepam (ATIVAN) tablet 1 mg (1 mg Oral Given 01/16/17 1901)   stroke: mapping our early stages of recovery book ( Does not apply Given 01/16/17 2239)     NEW OUTPATIENT MEDICATIONS STARTED DURING THIS VISIT:  None   Note:  This document was prepared using Dragon voice recognition software and may include unintentional dictation errors.  Nanda Quinton, MD Emergency Medicine   Margette Fast, MD 01/17/17 1017
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
## 2017-01-16 NOTE — ED Notes (Signed)
Patient transported to MRI 

## 2017-01-16 NOTE — ED Notes (Signed)
Activated code stroke @ 17:42
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
## 2017-01-17 ENCOUNTER — Encounter (HOSPITAL_COMMUNITY): Payer: Self-pay | Admitting: General Practice

## 2017-01-17 ENCOUNTER — Observation Stay (HOSPITAL_BASED_OUTPATIENT_CLINIC_OR_DEPARTMENT_OTHER): Payer: Medicare HMO

## 2017-01-17 DIAGNOSIS — I10 Essential (primary) hypertension: Secondary | ICD-10-CM | POA: Diagnosis not present

## 2017-01-17 DIAGNOSIS — G459 Transient cerebral ischemic attack, unspecified: Secondary | ICD-10-CM

## 2017-01-17 DIAGNOSIS — E785 Hyperlipidemia, unspecified: Secondary | ICD-10-CM | POA: Diagnosis not present

## 2017-01-17 DIAGNOSIS — R911 Solitary pulmonary nodule: Secondary | ICD-10-CM

## 2017-01-17 DIAGNOSIS — G458 Other transient cerebral ischemic attacks and related syndromes: Secondary | ICD-10-CM | POA: Diagnosis not present

## 2017-01-17 DIAGNOSIS — I674 Hypertensive encephalopathy: Secondary | ICD-10-CM

## 2017-01-17 LAB — VAS US CAROTID
LCCADDIAS: -16 cm/s
LCCADSYS: -98 cm/s
LCCAPSYS: 128 cm/s
LEFT ECA DIAS: -13 cm/s
LEFT VERTEBRAL DIAS: -10 cm/s
LICADSYS: -48 cm/s
Left CCA prox dias: 20 cm/s
Left ICA dist dias: -18 cm/s
Left ICA prox dias: -22 cm/s
Left ICA prox sys: -78 cm/s
RCCADSYS: -68 cm/s
RCCAPDIAS: 15 cm/s
RIGHT ECA DIAS: -9 cm/s
RIGHT VERTEBRAL DIAS: -13 cm/s
Right CCA prox sys: 92 cm/s

## 2017-01-17 LAB — LIPID PANEL
Cholesterol: 93 mg/dL (ref 0–200)
HDL: 40 mg/dL — ABNORMAL LOW (ref >40)
LDL Cholesterol: 30 mg/dL (ref 0–99)
TRIGLYCERIDES: 113 mg/dL (ref <150)
Total CHOL/HDL Ratio: 2.3 RATIO
VLDL: 23 mg/dL (ref 0–40)

## 2017-01-17 LAB — ECHOCARDIOGRAM COMPLETE
Height: 62 in
Weight: 2799.98 oz

## 2017-01-17 MED ORDER — CARBAMIDE PEROXIDE 6.5 % OT SOLN
5.0000 [drp] | Freq: Two times a day (BID) | OTIC | Status: DC
  Administered 2017-01-17: 5 [drp] via OTIC
  Filled 2017-01-17: qty 15

## 2017-01-17 MED ORDER — CARBAMIDE PEROXIDE 6.5 % OT SOLN: 5.0000 [drp] | Freq: Two times a day (BID) | OTIC | 0 refills | Status: DC

## 2017-01-17 NOTE — Progress Notes (Signed)
Pt arrived around 2100, alert and oriented [place] 0 all neuro symptoms have re, no complaints of pain started Q 2 neuro cheeks with vitals will continue to montor.

## 2017-01-17 NOTE — Progress Notes (Signed)
STROKE TEAM PROGRESS NOTE   HISTORY OF PRESENT ILLNESS (per record) Kathleen Guerrero is a 71 y.o. female who fell asleep inside a car for approx 1-1.5h and when she woke up she started having difficulty coming up with the right words to say.  This lasted for ever 30 minutes and then slowly resolved.  By the time she presented to out ED all her symptoms had resolved and her NIHSS was zero.  Therefore the code stroke was called off.  Her only other complaint was blurred vision, which was monocular in right eye.  It should be noted that pt has been very hypertensive in the last few days and that her doctor started a new medicine yesterday - pt does not remember the name of that medicine.   LKW: 2pm tpa given?: no, NIHSS = 0   SUBJECTIVE (INTERVAL HISTORY) No family is at the bedside.  She is back to baseline. She stated her word finding difficulty lasted about 20-65mn. Her BP was very high at that time. She said her PCP just changed her BP meds recently.    OBJECTIVE Temp:  [98.1 F (36.7 C)-99.1 F (37.3 C)] 98.8 F (37.1 C) (03/16 0646) Pulse Rate:  [53-75] 60 (03/16 0646) Cardiac Rhythm: Normal sinus rhythm (03/16 0700) Resp:  [12-18] 17 (03/16 0646) BP: (109-157)/(62-81) 130/62 (03/16 0646) SpO2:  [94 %-100 %] 96 % (03/16 0646) Weight:  [79.4 kg (175 lb)] 79.4 kg (175 lb) (03/16 0400)  CBC:   Recent Labs Lab 01/16/17 1743 01/16/17 1751  WBC 7.5  --   NEUTROABS 3.2  --   HGB 13.6 13.6  HCT 41.2 40.0  MCV 91.2  --   PLT 221  --     Basic Metabolic Panel:   Recent Labs Lab 01/16/17 1743 01/16/17 1751  NA 141 142  K 3.6 3.9  CL 103 100*  CO2 28  --   GLUCOSE 100* 98  BUN 14 18  CREATININE 1.17* 1.30*  CALCIUM 9.7  --     Lipid Panel:     Component Value Date/Time   CHOL 93 01/17/2017 0257   TRIG 113 01/17/2017 0257   HDL 40 (L) 01/17/2017 0257   CHOLHDL 2.3 01/17/2017 0257   VLDL 23 01/17/2017 0257   LDLCALC 30 01/17/2017 0257   HgbA1c:  Lab Results   Component Value Date   HGBA1C 5.7 (H) 07/21/2016   Urine Drug Screen: No results found for: LABOPIA, COCAINSCRNUR, LABBENZ, AMPHETMU, THCU, LABBARB    IMAGING I have personally reviewed the radiological images below and agree with the radiology interpretations.  Mr Kathleen Guerrero Contrast 01/16/2017 MRI HEAD  1. No acute intracranial infarct or other process identified.  2. Moderate chronic microvascular ischemic disease.  MRA HEAD  1. Mild to moderate atheromatous narrowing within the cavernous left ICA. No other high-grade or correctable stenosis within the intracranial circulation.  2. Distal small vessel atheromatous irregularity throughout the intracranial circulation.   Ct Head Code Stroke Guerrero Contrast 01/16/2017 1. No acute intracranial abnormality identified.  2. Mild-to-moderate chronic microvascular ischemic changes and mild parenchymal volume loss of the brain.  3. ASPECTS is 10  4. Stable midline parietal scalp lesion, likely a calcified dermal cyst.   CUS - Right - 1% to 39% ICA stenosis. Vertebral artery flow is antegrade. Left - No evidence of ICA stenosis however is quite tortuous. Vertebral artery flow is antegrade  TTE - Normal LV size with EF 60-65%. Normal RV size and systolic  function. No significant valvular abnormalities.   PHYSICAL EXAM  Temp:  [98.2 F (36.8 C)-98.8 F (37.1 C)] 98.6 F (37 C) (03/16 1330) Pulse Rate:  [60-75] 70 (03/16 1330) Resp:  [14-18] 18 (03/16 1330) BP: (109-157)/(62-86) 122/86 (03/16 1330) SpO2:  [94 %-96 %] 96 % (03/16 1330) Weight:  [175 lb (79.4 kg)] 175 lb (79.4 kg) (03/16 0400)  General - Well nourished, well developed, in no apparent distress.  Ophthalmologic - Sharp disc margins OU.   Cardiovascular - Regular rate and rhythm.  Mental Status -  Level of arousal and orientation to time, place, and person were intact. Language including expression, naming, repetition, comprehension was assessed and found  intact. Attention span and concentration were normal. Fund of Knowledge was assessed and was intact.  Cranial Nerves II - XII - II - Visual field intact OU. III, IV, VI - Extraocular movements intact. V - Facial sensation intact bilaterally. VII - Facial movement intact bilaterally. VIII - Hearing & vestibular intact bilaterally. X - Palate elevates symmetrically. XI - Chin turning & shoulder shrug intact bilaterally. XII - Tongue protrusion intact.  Motor Strength - The patient's strength was normal in all extremities and pronator drift was absent.  Bulk was normal and fasciculations were absent.   Motor Tone - Muscle tone was assessed at the neck and appendages and was normal.  Reflexes - The patient's reflexes were 1+ in all extremities and she had no pathological reflexes.  Sensory - Light touch, temperature/pinprick were assessed and were symmetrical.    Coordination - The patient had normal movements in the hands and feet with no ataxia or dysmetria.  Tremor was absent.  Gait and Station - deferred   ASSESSMENT/PLAN Kathleen Guerrero is a 71 y.o. female with history of previous stroke, tobacco use, and hypertension  Presenting with transient speech difficulties, blurred vision in the right eye, and elevated blood pressure.  She did not receive IV t-PA due to resolution of deficits.  hypertensive encephalopathy vs. anxiety:    Resultant  No neurodeficit  MRI - No acute intracranial infarct or other process identified.   MRA - Mild to moderate atheromatous narrowing within the cavernous left ICA.  Carotid Doppler - unremarkable.  2D Echo - EF 60-65%. No cardiac source of emboli identified.  Loop recorder interrogation no afib  LDL - 30  HgbA1c 5.4  VTE prophylaxis - Lovenox Diet Heart Room service appropriate? Yes; Fluid consistency: Thin  aspirin 325 mg daily prior to admission, now on aspirin 325 mg daily. Continue ASA on discharge.  Patient counseled to be  compliant with her antithrombotic medications  Ongoing aggressive stroke risk factor management  Therapy recommendations: No needs identified  Disposition:  Pending  Hx stroke   07/2016   right CR - TEE negative - on loop recorder - A1C 5.7, LDL 95, on ASA and lipitor  Followed with Dr. Leonie Man at [facility]  Hypertension  Stable  Long-term BP goal normotensive  Hyperlipidemia  Home meds:  Lipitor 40 mg daily resumed in hospital  LDL 30, goal < 70  Continue statin at discharge  Other Stroke Risk Factors  Advanced age  quit smoking 5 months ago.  ETOH use, advised to drink no more than 1 - 2 drink(s) a day  Obesity, Body mass index is 32.01 kg/m., recommend weight loss, diet and exercise as appropriate   Other Active Problems  Pulmonary nodule being followed by Dr Melvyn Novas.  Creatinine - 1.30  Hospital day # 0  Neurology will sign off. Please call with questions. Pt will follow up with Dr. Leonie Man at [facility] in about 6 weeks. Thanks for the consult.  Rosalin Hawking, MD PhD Stroke Neurology 01/18/2017 7:24 AM  To contact Stroke Continuity provider, please refer to [email or web address] After hours, contact General Neurology

## 2017-01-17 NOTE — Progress Notes (Signed)
VASCULAR LAB PRELIMINARY  PRELIMINARY  PRELIMINARY  PRELIMINARY  Carotid duplex completed.    Preliminary report: Right - 1% to 39% ICA stenosis. Vertebral artery flow is antegrade. Left - No evidence of ICA stenosis however is quite tortuous. Vertebral artery flow is antegrade  Landy Mace, RVS 01/17/2017, 10:35 AM

## 2017-01-17 NOTE — Discharge Summary (Signed)
Physician Discharge Summary  Kathleen Guerrero HKV:[id number] DOB: 1946/04/23 DOA: 01/16/2017  PCP: Mauricio Po, FNP  Admit date: 01/16/2017 Discharge date: 01/17/2017  Admitted From: Home Disposition:  Home  Recommendations for Outpatient Follow-up:  1. Follow up with PCP in 1-2 weeks 2. Follow up with Dr. Leonie Man as scheduled  Discharge Condition:Stable CODE STATUS:Full Diet recommendation: Heart healthy   Brief/Interim Summary: 71 y.o. female with medical history significant of hypertension, CVA who was waiting for her grandkids inside her vehicle in the school parking lot at around 1400. She says that she fell asleep and woke up about 1445 with slurred speech and blurred vision. She subsequently drove the kids home, noticed that her speech was improved, but still have blurred vision. She may have had a mild right facial droop on arrival per ED nursing staff, which is not noticeable at this time and the patient was unaware of. She denies headache, dizziness, double vision, extremity numbness or weakness.  Principal Problem:   TIA (transient ischemic attack) Neurology consulted. Medication changes noted on med-rec 2d echo echocardiogram and carotid Doppler performed with no ICA stenosis, EF noted to be 60-65% Continue aspirin 325 mg by mouth daily. Neurology is following. Discussed with Dr. Erlinda Hong. OK to d/c home with continued home meds and close follow up with Dr. Leonie Man in 4 weeks  Active Problems:   Essential hypertension Continued metoprolol 50 mg by mouth twice a day. BP well controlled thus far. Would hold off on valsartan per home regimen    Solitary pulmonary nodule The patient is following with pulmonology on a regular basis.    Overactive bladder Continue Ditropan XL 5 mg by mouth every 24 hours of bedtime.    Dyslipidemia, goal LDL below 70 D atorvastatin 40 mg by mouth daily. Monitor LFTs and fasting lipids periodically.  Discharge Diagnoses:  Principal Problem:    TIA (transient ischemic attack) Active Problems:   Essential hypertension   Solitary pulmonary nodule   Overactive bladder   Dyslipidemia, goal LDL below 70    Discharge Instructions   Allergies as of 01/17/2017   No Known Allergies     Medication List    STOP taking these medications   valsartan 160 MG tablet Commonly known as:  DIOVAN     TAKE these medications   aspirin 325 MG tablet TAKE 1 TABLET BY MOUTH EVERY DAY   atorvastatin 40 MG tablet Commonly known as:  LIPITOR TAKE 1 TABLET BY MOUTH EVERY DAY at 6 p.m.   carbamide peroxide 6.5 % otic solution Commonly known as:  DEBROX Place 5 drops into both ears 2 (two) times daily.   [employer] NICOTINE 2 MG lozenge Generic drug:  nicotine polacrilex Take 2 mg by mouth as needed for smoking cessation.   diphenhydrAMINE 25 MG tablet Commonly known as:  BENADRYL Take 25 mg by mouth at bedtime.   metoprolol 50 MG tablet Commonly known as:  LOPRESSOR Take 1 tablet (50 mg total) by mouth 2 (two) times daily.   oxybutynin 5 MG 24 hr tablet Commonly known as:  DITROPAN-XL TAKE 1 TABLET BY MOUTH AT BEDTIME      Follow-up Information    Mauricio Po, FNP. Schedule an appointment as soon as possible for a visit in 2 week(s).   Specialty:  Family Medicine Contact information: [place] [address] [phone]        SETHI,PRAMOD, MD. Schedule an appointment as soon as possible for a visit in 4 week(s).  Specialties:  Neurology, Radiology Contact information: [address] [place] [address] [phone]          No Known Allergies  Consultations:  Neurology  Procedures/Studies: Ct Chest Wo Contrast  Result Date: 01/02/2017 CLINICAL DATA:  Followup indeterminate right upper lobe pulmonary nodule. EXAM: CT CHEST WITHOUT CONTRAST TECHNIQUE: Multidetector CT imaging of the chest was performed following the standard protocol without IV contrast. COMPARISON:  10/02/2016 and  07/22/2016 FINDINGS: Cardiovascular: No acute findings. Aortic and coronary artery atherosclerosis. Mediastinum/Nodes: No masses or pathologically enlarged lymph nodes identified on this unenhanced exam. Lungs/Pleura: 8 x 14 mm ground-glass nodule in the peripheral right upper lobe on image 44/3 remains stable compared to previous studies. This is suspicious for low-grade adenocarcinoma. No other suspicious pulmonary nodules or masses are identified. No evidence of pulmonary infiltrate or pleural effusion. Upper Abdomen: Tiny hiatal hernia. Tiny nonobstructing left renal calculi. Musculoskeletal:  No suspicious bone lesions. IMPRESSION: Stable 8 x 14 mm ground-glass pulmonary nodule in peripheral right upper lobe. This remains suspicious for low-grade adenocarcinoma. Thoracic surgical consultation is recommended for consideration of biopsy or surgical resection. No evidence of lymphadenopathy or pleural effusion. Incidental findings including aortic and coronary atherosclerosis, tiny hiatal hernia, and left nephrolithiasis. Electronically Signed   By: Earle Gell M.D.   On: 01/02/2017 08:54   Mr [name] Head Wo Contrast  Result Date: 01/16/2017 CLINICAL DATA:  Initial evaluation for acute speech difficulty, now resolved. Blurry vision in right eye. EXAM: MRI HEAD WITHOUT CONTRAST MRA HEAD WITHOUT CONTRAST TECHNIQUE: Multiplanar, multiecho pulse sequences of the brain and surrounding structures were obtained without intravenous contrast. Angiographic images of the head were obtained using MRA technique without contrast. COMPARISON:  Prior CT from earlier the same day. FINDINGS: MRI HEAD FINDINGS Brain: Diffuse prominence of the CSF containing spaces is compatible with generalized cerebral atrophy. Patchy and confluent T2/FLAIR hyperintensity within the periventricular and deep white matter both cerebral hemispheres most compatible chronic microvascular ischemic disease, moderate nature. Superimposed remote lacunar  infarct present within the deep/subcortical white matter of the posterior right centrum semi ovale (series 8, image 19). No abnormal foci of restricted diffusion to suggest acute ischemic infarct. Gray-white matter differentiation maintained. No susceptibility artifact to suggest acute or chronic intracranial hemorrhage. No mass lesion, midline shift or mass effect. No hydrocephalus. No extra-axial fluid collection. Major dural sinuses are grossly patent. Pituitary gland and suprasellar region within normal limits. Vascular: Major intracranial vascular flow voids maintained. Skull and upper cervical spine: Craniocervical junction within normal limits. Visualized upper cervical spine unremarkable. Bone marrow signal intensity within normal limits. 2 cm well-circumscribed soft tissue nodule at the posterior scalp, likely a sebaceous cyst. Scalp soft tissues otherwise unremarkable. Sinuses/Orbits: Globes and orbital soft tissues grossly unremarkable, although evaluation mildly limited by motion. Paranasal sinuses are clear. No mastoid effusion. Inner ear structures normal. Other: None. MRA HEAD FINDINGS ANTERIOR CIRCULATION: Distal cervical segments of the internal carotid arteries are patent with antegrade flow. Petrous segments widely patent. Atheromatous irregularity with mild to moderate narrowing present within the cavernous left ICA and (series 4, image 71). Cavernous right ICA widely patent. ICA termini patent. A1 segments well opacified. Anterior communicating artery within normal limits. Anterior cerebral arteries patent to their distal aspects. M1 segments widely patent without stenosis or occlusion. Distal MCA branches well opacified and symmetric. Distal small vessel atheromatous irregularity. POSTERIOR CIRCULATION: Vertebral arteries patent to the vertebrobasilar junction. Right vertebral artery tortuous crossing the midline prior to the vertebrobasilar junction. Posterior  inferior cerebral arteries patent  proximally. The basilar artery mildly tortuous but widely patent to its distal aspect. Superior cerebral arteries patent bilaterally. Both of the posterior cerebral arteries supplied mainly via the basilar artery and are widely patent to their distal aspects. No aneurysm or vascular malformation. IMPRESSION: MRI HEAD IMPRESSION: 1. No acute intracranial infarct or other process identified. 2. Moderate chronic microvascular ischemic disease. MRA HEAD IMPRESSION: 1. Mild to moderate atheromatous narrowing within the cavernous left ICA. No other high-grade or correctable stenosis within the intracranial circulation. 2. Distal small vessel atheromatous irregularity throughout the intracranial circulation. Electronically Signed   By: Jeannine Boga M.D.   On: 01/16/2017 20:31   Mr Brain Wo Contrast  Result Date: 01/16/2017 CLINICAL DATA:  Initial evaluation for acute speech difficulty, now resolved. Blurry vision in right eye. EXAM: MRI HEAD WITHOUT CONTRAST MRA HEAD WITHOUT CONTRAST TECHNIQUE: Multiplanar, multiecho pulse sequences of the brain and surrounding structures were obtained without intravenous contrast. Angiographic images of the head were obtained using MRA technique without contrast. COMPARISON:  Prior CT from earlier the same day. FINDINGS: MRI HEAD FINDINGS Brain: Diffuse prominence of the CSF containing spaces is compatible with generalized cerebral atrophy. Patchy and confluent T2/FLAIR hyperintensity within the periventricular and deep white matter both cerebral hemispheres most compatible chronic microvascular ischemic disease, moderate nature. Superimposed remote lacunar infarct present within the deep/subcortical white matter of the posterior right centrum semi ovale (series 8, image 19). No abnormal foci of restricted diffusion to suggest acute ischemic infarct. Gray-white matter differentiation maintained. No susceptibility artifact to suggest acute or chronic intracranial hemorrhage. No  mass lesion, midline shift or mass effect. No hydrocephalus. No extra-axial fluid collection. Major dural sinuses are grossly patent. Pituitary gland and suprasellar region within normal limits. Vascular: Major intracranial vascular flow voids maintained. Skull and upper cervical spine: Craniocervical junction within normal limits. Visualized upper cervical spine unremarkable. Bone marrow signal intensity within normal limits. 2 cm well-circumscribed soft tissue nodule at the posterior scalp, likely a sebaceous cyst. Scalp soft tissues otherwise unremarkable. Sinuses/Orbits: Globes and orbital soft tissues grossly unremarkable, although evaluation mildly limited by motion. Paranasal sinuses are clear. No mastoid effusion. Inner ear structures normal. Other: None. MRA HEAD FINDINGS ANTERIOR CIRCULATION: Distal cervical segments of the internal carotid arteries are patent with antegrade flow. Petrous segments widely patent. Atheromatous irregularity with mild to moderate narrowing present within the cavernous left ICA and (series 4, image 71). Cavernous right ICA widely patent. ICA termini patent. A1 segments well opacified. Anterior communicating artery within normal limits. Anterior cerebral arteries patent to their distal aspects. M1 segments widely patent without stenosis or occlusion. Distal MCA branches well opacified and symmetric. Distal small vessel atheromatous irregularity. POSTERIOR CIRCULATION: Vertebral arteries patent to the vertebrobasilar junction. Right vertebral artery tortuous crossing the midline prior to the vertebrobasilar junction. Posterior inferior cerebral arteries patent proximally. The basilar artery mildly tortuous but widely patent to its distal aspect. Superior cerebral arteries patent bilaterally. Both of the posterior cerebral arteries supplied mainly via the basilar artery and are widely patent to their distal aspects. No aneurysm or vascular malformation. IMPRESSION: MRI HEAD  IMPRESSION: 1. No acute intracranial infarct or other process identified. 2. Moderate chronic microvascular ischemic disease. MRA HEAD IMPRESSION: 1. Mild to moderate atheromatous narrowing within the cavernous left ICA. No other high-grade or correctable stenosis within the intracranial circulation. 2. Distal small vessel atheromatous irregularity throughout the intracranial circulation. Electronically Signed   By: Jeannine Boga M.D.   On: 01/16/2017  20:31   Ct Head Code Stroke Wo Contrast  Result Date: 01/16/2017 CLINICAL DATA:  Code stroke. EXAM: CT HEAD WITHOUT CONTRAST TECHNIQUE: Contiguous axial images were obtained from the base of the skull through the vertex without intravenous contrast. COMPARISON:  07/20/2016 CT head FINDINGS: Brain: No evidence of acute infarction, hemorrhage, hydrocephalus, extra-axial collection or mass lesion/mass effect. Stable mild-to-moderate chronic microvascular ischemic changes and mild brain parenchymal volume loss. Vascular: Moderate calcific atherosclerosis of cavernous and paraclinoid internal carotid arteries. Skull: Midline parietal scalp lesion measuring 13 x 22 mm with calcifications, probably a calcified dermal cyst there are no displaced calvarial fracture. Sinuses/Orbits: No acute finding. Other: None. ASPECTS [facility] Stroke Program Early CT Score) - Ganglionic level infarction (caudate, lentiform nuclei, internal capsule, insula, M1-M3 cortex): 7 - Supraganglionic infarction (M4-M6 cortex): 3 Total score (0-10 with 10 being normal): 10 IMPRESSION: 1. No acute intracranial abnormality identified. 2. Mild-to-moderate chronic microvascular ischemic changes and mild parenchymal volume loss of the brain. 3. ASPECTS is 10 4. Stable midline parietal scalp lesion, likely a calcified dermal cyst. These results were called by telephone at the time of interpretation on 01/16/2017 at 6:08 pm to Dr. Sallyanne Havers , who verbally acknowledged these results. Electronically  Signed   By: Kristine Garbe M.D.   On: 01/16/2017 18:11    Subjective: Eager to go home  Discharge Exam: Vitals:   01/17/17 0646 01/17/17 1330  BP: 130/62 122/86  Pulse: 60 70  Resp: 17 18  Temp: 98.8 F (37.1 C) 98.6 F (37 C)   Vitals:   01/17/17 0400 01/17/17 0500 01/17/17 0646 01/17/17 1330  BP:  109/63 130/62 122/86  Pulse:  66 60 70  Resp:  '15 17 18  '$ Temp:  98.4 F (36.9 C) 98.8 F (37.1 C) 98.6 F (37 C)  TempSrc:  Oral Oral Oral  SpO2:  94% 96% 96%  Weight: 79.4 kg (175 lb)     Height: '5\' 2"'$  (1.575 m)       General: Pt is alert, awake, not in acute distress Cardiovascular: RRR, S1/S2 +, no rubs, no gallops Respiratory: CTA bilaterally, no wheezing, no rhonchi Abdominal: Soft, NT, ND, bowel sounds + Extremities: no edema, no cyanosis   The results of significant diagnostics from this hospitalization (including imaging, microbiology, ancillary and laboratory) are listed below for reference.     Microbiology: No results found for this or any previous visit (from the past 240 hour(s)).   Labs: BNP (last 3 results) No results for input(s): BNP in the last 8760 hours. Basic Metabolic Panel:  Recent Labs Lab 01/16/17 1743 01/16/17 1751  NA 141 142  K 3.6 3.9  CL 103 100*  CO2 28  --   GLUCOSE 100* 98  BUN 14 18  CREATININE 1.17* 1.30*  CALCIUM 9.7  --    Liver Function Tests:  Recent Labs Lab 01/16/17 1743  AST 22  ALT 27  ALKPHOS 50  BILITOT 0.5  PROT 7.1  ALBUMIN 3.9   No results for input(s): LIPASE, AMYLASE in the last 168 hours. No results for input(s): AMMONIA in the last 168 hours. CBC:  Recent Labs Lab 01/16/17 1743 01/16/17 1751  WBC 7.5  --   NEUTROABS 3.2  --   HGB 13.6 13.6  HCT 41.2 40.0  MCV 91.2  --   PLT 221  --    Cardiac Enzymes: No results for input(s): CKTOTAL, CKMB, CKMBINDEX, TROPONINI in the last 168 hours. BNP: Invalid input(s): POCBNP CBG:  Recent  Labs Lab 01/16/17 1742 01/16/17 1806   GLUCAP 100* 103*   D-Dimer No results for input(s): DDIMER in the last 72 hours. Hgb A1c No results for input(s): HGBA1C in the last 72 hours. Lipid Profile  Recent Labs  01/17/17 0257  CHOL 93  HDL 40*  LDLCALC 30  TRIG 113  CHOLHDL 2.3   Thyroid function studies No results for input(s): TSH, T4TOTAL, T3FREE, THYROIDAB in the last 72 hours.  Invalid input(s): FREET3 Anemia work up No results for input(s): VITAMINB12, FOLATE, FERRITIN, TIBC, IRON, RETICCTPCT in the last 72 hours. Urinalysis No results found for: COLORURINE, APPEARANCEUR, LABSPEC, [place], GLUCOSEU, HGBUR, BILIRUBINUR, KETONESUR, PROTEINUR, UROBILINOGEN, NITRITE, LEUKOCYTESUR Sepsis Labs Invalid input(s): PROCALCITONIN,  WBC,  LACTICIDVEN Microbiology No results found for this or any previous visit (from the past 240 hour(s)).   SIGNED:   Donne Hazel, MD  [facility] 01/17/2017, 3:37 PM  If 7PM-7AM, please contact night-coverage [email or web address] Password [email or web address]

## 2017-01-17 NOTE — Progress Notes (Signed)
Echocardiogram 2D Echocardiogram has been performed.  Kathleen Guerrero 01/17/2017, 11:01 AM

## 2017-01-17 NOTE — Care Management Note (Signed)
Case Management Note  Patient Details  Name: Kathleen Guerrero MRN: [id number] Date of Birth: June 13, 1946  Subjective/Objective:                    Action/Plan: Pt discharging home with self care. Pt states she has transportation home. Pt has insurance and PCP. No further needs per CM.   Expected Discharge Date:                  Expected Discharge Plan:  Home/Self Care  In-House Referral:     Discharge planning Services     Post Acute Care Choice:    Choice offered to:     DME Arranged:    DME Agency:     HH Arranged:    [place] Agency:     Status of Service:  Completed, signed off  If discussed at [employer] of Stay Meetings, dates discussed:    Additional Comments:  Pollie Friar, RN 01/17/2017, 3:26 PM

## 2017-01-17 NOTE — Care Management Obs Status (Signed)
[facility] NOTIFICATION   Patient Details  Name: Kathleen Guerrero MRN: [id number] Date of Birth: 1946/09/10   Medicare Observation Status Notification Given:  Yes    Pollie Friar, RN 01/17/2017, 3:25 PM

## 2017-01-17 NOTE — Progress Notes (Signed)
RN discussed discharge instructions with patient including f/u appts, discharge medication changes, pt aware of when next dosage of all meds is due, verbalized understanding of s/sx of stroke. Neuro assessment unchanged, denies any pain. Patient is dressing, patient called transportation for ride

## 2017-01-17 NOTE — Progress Notes (Signed)
SLP Cancellation Note  Patient Details Name: Kathleen Guerrero MRN: [id number] DOB: 1946-08-07   Cancelled treatment:       Reason Eval/Treat Not Completed: SLP screened, no needs identified, will sign off. Pt without acute infarct on MRI and reports all initial symptoms have resolved. She declines SLP evaluation at this time. Please reorder if needed.   Germain Osgood 01/17/2017, 3:24 PM  Germain Osgood, M.A. CCC-SLP [phone]

## 2017-01-17 NOTE — Progress Notes (Signed)
PT Cancellation Note  Patient Details Name: Kathleen Guerrero MRN: [id number] DOB: [birth year]   Cancelled Treatment:    Reason Eval/Treat Not Completed: PT screened, no needs identified, will sign off. Pt is independent and moving around room without assistance. Pt reports not functional loss since episode. PT will sign off. Please re-order if anything changes.    Scheryl Marten PT, DPT  [phone]  01/17/2017, 2:30 PM

## 2017-01-17 NOTE — Evaluation (Signed)
Occupational Therapy Evaluation and Discharge Patient Details Name: Kathleen Guerrero MRN: [id number] DOB: 02/25/1946 Today's Date: 01/17/2017    History of Present Illness Kathleen Guerrero is a 72 y.o. female with medical history significant of hypertension, CVA who was waiting for her grandkids inside her vehicle in the school parking lot at around 1400. She says that she fell asleep and woke up about 1445 with slurred speech and blurred vision. She subsequently drove the kids home, noticed that her speech was improved, but still have blurred vision which was monocular in right eye. By the time she presented to out ED all her symptoms had resolved. CT scan of the head and MRI/MRA of the brain did not show any acute abnormality.   Clinical Impression   PTA Pt independent in DL and mobility. 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 Pt back at baseline, and independent in room (Please see performance level below) OT assessment and education complete. OT to sign off at this point, thank you for the referral.    Follow Up Recommendations  No OT follow up    Equipment Recommendations  None recommended by OT    Recommendations for Other Services       Precautions / Restrictions Restrictions Weight Bearing Restrictions: No      Mobility Bed Mobility Overal bed mobility: Independent             General bed mobility comments: no problems  Transfers Overall transfer level: Independent Equipment used: None                  Balance Overall balance assessment: No apparent balance deficits (not formally assessed)                                          ADL Overall ADL's : Independent                                       General ADL Comments: Pt able to perform transfers, toileting (and peri care), sink level grooming, problem solve and read from menu with no LOB, no  assist needed     Vision Baseline Vision/History: Wears glasses Wears Glasses: Reading only Patient Visual Report: No change from baseline (Initial issues have resolved per Pt) Vision Assessment?: Yes Eye Alignment: Within Functional Limits Ocular Range of Motion: Within Functional Limits Alignment/Gaze Preference: Within Defined Limits Tracking/Visual Pursuits: Able to track stimulus in all quads without difficulty Saccades: Within functional limits Convergence: Within functional limits Visual Fields: No apparent deficits     Perception     Praxis      Pertinent Vitals/Pain Pain Assessment: No/denies pain     Hand Dominance Right   Extremity/Trunk Assessment Upper Extremity Assessment Upper Extremity Assessment: Overall WFL for tasks assessed   Lower Extremity Assessment Lower Extremity Assessment: Overall WFL for tasks assessed       Communication Communication Communication: No difficulties   Cognition Arousal/Alertness: Awake/alert Behavior During Therapy: WFL for tasks assessed/performed Overall Cognitive Status: Within Functional Limits for tasks assessed                 General Comments: Pt able to problem solve, oriented x4   General Comments  Exercises       Shoulder Instructions      Home Living Family/patient expects to be discharged to:: Private residence Living Arrangements: Alone Available Help at Discharge: Friend(s);Available PRN/intermittently Type of Home: Other(Comment) (Condo) Home Access: Stairs to enter Entrance Stairs-Number of Steps: 5 Entrance Stairs-Rails: Right;Left;Can reach both Home Layout: One level     Bathroom Shower/Tub: Tub/shower unit Shower/tub characteristics: Architectural technologist: Standard Bathroom Accessibility: No   Home Equipment: Grab bars - toilet;Grab bars - tub/shower;Walker - 2 wheels;Cane - single point;Shower seat          Prior Functioning/Environment Level of Independence:  Independent        Comments: 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; drives        OT Problem List: Impaired vision/perception;Impaired balance (sitting and/or standing)      OT Treatment/Interventions:      OT Goals(Current goals can be found in the care plan section) Acute Rehab OT Goals Patient Stated Goal: To get home OT Goal Formulation: With patient Time For Goal Achievement: 01/24/17 Potential to Achieve Goals: Good  OT Frequency:     Barriers to D/C:            Co-evaluation              End of Session Nurse Communication: Mobility status  Activity Tolerance: Patient tolerated treatment well Patient left: in bed;with call bell/phone within reach  OT Visit Diagnosis: Other symptoms and signs involving the nervous system (R29.898);Other symptoms and signs involving cognitive function;Other (comment) (Blurred Vision)                ADL either performed or assessed with clinical judgement  Time: 1331-1400 OT Time Calculation (min): 29 min Charges:  OT General Charges $OT Visit: 1 Procedure OT Evaluation $OT Eval Low Complexity: 1 Procedure OT Treatments $Therapeutic Activity: 8-22 mins G-Codes: OT G-codes **NOT FOR INPATIENT CLASS** Functional Assessment Tool Used: AM-PAC 6 Clicks Daily Activity Functional Limitation: Self care Self Care Current Status (X1155): 0 percent impaired, limited or restricted Self Care Goal Status (M0802): 0 percent impaired, limited or restricted Self Care Discharge Status (M3361): 0 percent impaired, limited or restricted   Hulda Humphrey OTR/L [place] 01/17/2017, 2:06 PM

## 2017-01-18 ENCOUNTER — Other Ambulatory Visit: Payer: Self-pay | Admitting: Neurology

## 2017-01-18 DIAGNOSIS — Z8673 Personal history of transient ischemic attack (TIA), and cerebral infarction without residual deficits: Secondary | ICD-10-CM

## 2017-01-18 LAB — HEMOGLOBIN A1C
Hgb A1c MFr Bld: 5.4 % (ref 4.8–5.6)
MEAN PLASMA GLUCOSE: 108 mg/dL

## 2017-01-18 MED ORDER — VALSARTAN 160 MG PO TABS: 160.0000 mg | ORAL_TABLET | Freq: Every day | ORAL | 3 refills | Status: DC

## 2017-01-18 NOTE — Assessment & Plan Note (Signed)
Mild to mod persistent uncontrolled, to add diovan 160,  to f/u any worsening symptoms or concerns

## 2017-01-20 ENCOUNTER — Other Ambulatory Visit: Payer: Self-pay | Admitting: *Deleted

## 2017-01-20 ENCOUNTER — Ambulatory Visit (INDEPENDENT_AMBULATORY_CARE_PROVIDER_SITE_OTHER): Payer: Medicare HMO | Admitting: *Deleted

## 2017-01-20 DIAGNOSIS — I639 Cerebral infarction, unspecified: Secondary | ICD-10-CM

## 2017-01-20 MED ORDER — ATORVASTATIN CALCIUM 40 MG PO TABS: ORAL_TABLET | ORAL | 1 refills | Status: DC

## 2017-01-20 NOTE — Progress Notes (Signed)
[facility] Summary Report / Loop Recorder 

## 2017-01-21 ENCOUNTER — Encounter: Payer: Self-pay | Admitting: Family

## 2017-01-21 ENCOUNTER — Telehealth: Payer: Self-pay | Admitting: Family

## 2017-01-21 ENCOUNTER — Ambulatory Visit (INDEPENDENT_AMBULATORY_CARE_PROVIDER_SITE_OTHER): Payer: Medicare HMO | Admitting: Family

## 2017-01-21 VITALS — BP 172/100 | HR 57 | Temp 98.1°F | Resp 16 | Ht 62.0 in | Wt 177.0 lb

## 2017-01-21 DIAGNOSIS — I10 Essential (primary) hypertension: Secondary | ICD-10-CM | POA: Diagnosis not present

## 2017-01-21 MED ORDER — AMLODIPINE BESYLATE 10 MG PO TABS: 10.0000 mg | ORAL_TABLET | Freq: Every day | ORAL | 1 refills | Status: DC

## 2017-01-21 NOTE — Telephone Encounter (Signed)
Pt called in stating that her BP is "all over the place".  BP was 195/149 when she woke up this morning.  After meds it came down to 144/88 which was an hour after she got up.  BP is currently 177/94.  Pt took the medicine at 8am.

## 2017-01-21 NOTE — Patient Instructions (Addendum)
Thank you for choosing [facility].  SUMMARY AND INSTRUCTIONS:  Please continue to take the metoprolol twice daily.  Start the amlodipine daily.  Take your blood pressure at different times throughout the day.  It may take a few days for the new medication to work as it builds up in your system.  Try [place] ear wax kit to help with ear wax removal.   Medication:  Your prescription(s) have been submitted to your pharmacy or been printed and provided for you. Please take as directed and contact our office if you believe you are having problem(s) with the medication(s) or have any questions.  Follow up:  If your symptoms worsen or fail to improve, please contact our office for further instruction, or in case of emergency go directly to the emergency room at the closest medical facility.

## 2017-01-21 NOTE — Telephone Encounter (Signed)
Seen in the office

## 2017-01-21 NOTE — Assessment & Plan Note (Signed)
Blood pressure remains elevated above goal 140/90 with current medication regimen. Does have mild headache denies worse headache of life. No new symptoms of end organ damage noted on physical exam. Discontinue valsartan given questionable side effects. Start amlodipine. Continue current dosage of metoprolol. Monitor blood pressure at home sporadically throughout the day and record values. Blood pressure cuff accuracy check today about 10 point differential from manual blood pressure cuff reading. Follow-up in one week or sooner if symptoms worsen or do not improve.

## 2017-01-21 NOTE — Progress Notes (Signed)
Subjective:    Patient ID: Kathleen Guerrero, female    DOB: 12-17-1945, 71 y.o.   MRN: [id number]  Chief Complaint  Patient presents with  . Hypertension    aspirin, metoprolol, and valsartan this morning, side affects from valsartan     HPI:  Kathleen Guerrero is a 71 y.o. female who  has a past medical history of Hypertension; Stroke ([place]); and Tobacco use. and presents today for an office follow up.   Hypertension - Currently maintained on metoprolol. Reports taking the medication as prescribed and has concerns for side effects from the valsartan. She was noted to have elevated blood pressure readings of 195/149 and 177/94. Denies worst headache of life with no symptoms of end organ damage. She was recently admitted to the hospital with concern for TIA. This morning she has taken her metoprolol.  BP Readings from Last 3 Encounters:  01/21/17 (!) 172/100  01/17/17 122/86  01/15/17 (!) 158/110     No Known Allergies    Outpatient Medications Prior to Visit  Medication Sig Dispense Refill  . aspirin 325 MG tablet TAKE 1 TABLET BY MOUTH EVERY DAY 30 tablet 11  . atorvastatin (LIPITOR) 40 MG tablet TAKE 1 TABLET BY MOUTH EVERY DAY at 6 p.m. 90 tablet 1  . carbamide peroxide (DEBROX) 6.5 % otic solution Place 5 drops into both ears 2 (two) times daily. 15 mL 0  . diphenhydrAMINE (BENADRYL) 25 MG tablet Take 25 mg by mouth at bedtime.     . metoprolol (LOPRESSOR) 50 MG tablet Take 1 tablet (50 mg total) by mouth 2 (two) times daily. 60 tablet 11  . nicotine polacrilex ([facility] NICOTINE) 2 MG lozenge Take 2 mg by mouth as needed for smoking cessation.    Marland Kitchen oxybutynin (DITROPAN-XL) 5 MG 24 hr tablet TAKE 1 TABLET BY MOUTH AT BEDTIME 30 tablet 0  . valsartan (DIOVAN) 160 MG tablet Take 1 tablet (160 mg total) by mouth daily. 90 tablet 3   No facility-administered medications prior to visit.      Review of Systems  Constitutional: Negative for chills and fever.  Eyes:       Negative for  changes in vision  Respiratory: Negative for cough, chest tightness and wheezing.   Cardiovascular: Negative for chest pain, palpitations and leg swelling.  Neurological: Negative for dizziness, weakness and light-headedness.      Objective:    BP (!) 172/100 (BP Location: Left Arm, Patient Position: Sitting, Cuff Size: Large)   Pulse (!) 57   Temp 98.1 F (36.7 C) (Oral)   Resp 16   Ht '5\' 2"'$  (1.575 m)   Wt 177 lb (80.3 kg)   SpO2 96%   BMI 32.37 kg/m  Nursing note and vital signs reviewed.  Physical Exam  Constitutional: She is oriented to person, place, and time. She appears well-developed and well-nourished. No distress.  Cardiovascular: Normal rate, regular rhythm, normal heart sounds and intact distal pulses.   Pulmonary/Chest: Effort normal and breath sounds normal.  Neurological: She is alert and oriented to person, place, and time.  Skin: Skin is warm and dry.  Psychiatric: She has a normal mood and affect. Her behavior is normal. Judgment and thought content normal.       Assessment & Plan:   Problem List Items Addressed This Visit      Cardiovascular and Mediastinum   Essential hypertension - Primary    Blood pressure remains elevated above goal 140/90 with current medication regimen. Does  have mild headache denies worse headache of life. No new symptoms of end organ damage noted on physical exam. Discontinue valsartan given questionable side effects. Start amlodipine. Continue current dosage of metoprolol. Monitor blood pressure at home sporadically throughout the day and record values. Blood pressure cuff accuracy check today about 10 point differential from manual blood pressure cuff reading. Follow-up in one week or sooner if symptoms worsen or do not improve.      Relevant Medications   amLODipine (NORVASC) 10 MG tablet       I have discontinued [name] valsartan. I am also having her start on amLODipine. Additionally, I am having her maintain her  nicotine polacrilex, aspirin, oxybutynin, diphenhydrAMINE, metoprolol, carbamide peroxide, and atorvastatin.   Meds ordered this encounter  Medications  . amLODipine (NORVASC) 10 MG tablet    Sig: Take 1 tablet (10 mg total) by mouth daily.    Dispense:  30 tablet    Refill:  1    Order Specific Question:   Supervising Provider    Answer:   Pricilla Holm A [[id number]]     Follow-up: Return in about 1 week (around 01/28/2017), or if symptoms worsen or fail to improve.  Mauricio Po, FNP

## 2017-01-23 ENCOUNTER — Other Ambulatory Visit: Payer: Self-pay | Admitting: Family

## 2017-01-24 ENCOUNTER — Telehealth: Payer: Self-pay | Admitting: Family

## 2017-01-24 NOTE — Telephone Encounter (Signed)
Patient states she was to call back today to give her bp readings to see if she needed to be seen today by Marya Amsler per her last OV.   Patients readings: Thurs: 145/88 -am 171/88 - pm (thinks took med before this reading)  Fri: 145/75 - am   Patient states if she needs to be seen she needs a visit before 12.

## 2017-01-24 NOTE — Telephone Encounter (Signed)
Please advise 

## 2017-01-24 NOTE — Telephone Encounter (Signed)
Called pt to let her know that BP was much better and continue with meds as prescribed and follow up Monday. Pt understood.

## 2017-01-24 NOTE — Telephone Encounter (Signed)
Blood pressures look good - continue current medication. Follow up on Monday as scheduled.

## 2017-01-27 ENCOUNTER — Encounter: Payer: Self-pay | Admitting: Family

## 2017-01-27 ENCOUNTER — Ambulatory Visit (INDEPENDENT_AMBULATORY_CARE_PROVIDER_SITE_OTHER): Payer: Medicare HMO | Admitting: Family

## 2017-01-27 VITALS — BP 124/74 | HR 59 | Temp 99.0°F | Resp 16 | Ht 62.0 in | Wt 176.0 lb

## 2017-01-27 DIAGNOSIS — I10 Essential (primary) hypertension: Secondary | ICD-10-CM

## 2017-01-27 MED ORDER — AMLODIPINE BESYLATE 10 MG PO TABS: 10.0000 mg | ORAL_TABLET | Freq: Every day | ORAL | 0 refills | Status: DC

## 2017-01-27 NOTE — Patient Instructions (Signed)
Thank you for choosing [facility].  SUMMARY AND INSTRUCTIONS:  Continue to monitor blood pressure at home.  Follow a low sodium diet.   Nurse visit for blood pressure check.   Medication:  Please continue to take medications as prescribed.   Your prescription(s) have been submitted to your pharmacy or been printed and provided for you. Please take as directed and contact our office if you believe you are having problem(s) with the medication(s) or have any questions.  Follow up:  If your symptoms worsen or fail to improve, please contact our office for further instruction, or in case of emergency go directly to the emergency room at the closest medical facility.

## 2017-01-27 NOTE — Assessment & Plan Note (Signed)
Blood pressure with improved control with Lopressor and amlodipine. Reports no adverse side effects or hypotensive readings. Denies worse headache of life with no new symptoms of end organ damage noted on physical exam. Continue current dosage of amlodipine and metoprolol. Encouraged to follow low-sodium diet and to monitor blood pressure at home. Follow-up for nurse visit for blood pressure check

## 2017-01-27 NOTE — Progress Notes (Signed)
Subjective:    Patient ID: Kathleen Guerrero, female    DOB: 01-21-1946, 71 y.o.   MRN: [id number]  Chief Complaint  Patient presents with  . Follow-up    blood pressure     HPI:  Kathleen Guerrero is a 71 y.o. female who  has a past medical history of Hypertension; Stroke ([place]); and Tobacco use. and presents today for a follow up office visit.  1.) Hypertension - Currently maintained on amlodipine and metoprolol.  and reports taking the medications as prescribed and denies adverse side effects or hypotensive readings. Blood pressures at home have been well controlled and below goal.. Denies changes in vision, worst headache of life or new symptoms of end organ damage. Working on following a low sodium diet. Physical activity level   BP Readings from Last 3 Encounters:  01/27/17 124/74  01/21/17 (!) 172/100  01/17/17 122/86    No Known Allergies   Outpatient Medications Prior to Visit  Medication Sig Dispense Refill  . aspirin 325 MG tablet TAKE 1 TABLET BY MOUTH EVERY DAY 30 tablet 11  . atorvastatin (LIPITOR) 40 MG tablet TAKE 1 TABLET BY MOUTH EVERY DAY at 6 p.m. 90 tablet 1  . carbamide peroxide (DEBROX) 6.5 % otic solution Place 5 drops into both ears 2 (two) times daily. 15 mL 0  . diphenhydrAMINE (BENADRYL) 25 MG tablet Take 25 mg by mouth at bedtime.     . metoprolol (LOPRESSOR) 50 MG tablet Take 1 tablet (50 mg total) by mouth 2 (two) times daily. 60 tablet 11  . nicotine polacrilex ([employer] NICOTINE) 2 MG lozenge Take 2 mg by mouth as needed for smoking cessation.    Marland Kitchen oxybutynin (DITROPAN-XL) 5 MG 24 hr tablet TAKE 1 TABLET BY MOUTH AT BEDTIME 90 tablet 0  . amLODipine (NORVASC) 10 MG tablet Take 1 tablet (10 mg total) by mouth daily. 30 tablet 1  . GNP ASPIRIN 325 MG EC tablet TAKE 1 TABLET BY MOUTH daily 90 tablet 0   No facility-administered medications prior to visit.     Review of Systems  Constitutional: Negative for chills and fever.  Eyes:       Negative for  changes in vision  Respiratory: Negative for cough, chest tightness and wheezing.   Cardiovascular: Negative for chest pain, palpitations and leg swelling.  Neurological: Negative for dizziness, weakness and light-headedness.      Objective:    BP 124/74 (BP Location: Left Arm, Patient Position: Sitting, Cuff Size: Large)   Pulse (!) 59   Temp 99 F (37.2 C) (Oral)   Resp 16   Ht '5\' 2"'$  (1.575 m)   Wt 176 lb (79.8 kg)   SpO2 97%   BMI 32.19 kg/m  Nursing note and vital signs reviewed.  Physical Exam  Constitutional: She is oriented to person, place, and time. She appears well-developed and well-nourished. No distress.  Cardiovascular: Normal rate, regular rhythm, normal heart sounds and intact distal pulses.   Pulmonary/Chest: Effort normal and breath sounds normal.  Neurological: She is alert and oriented to person, place, and time.  Skin: Skin is warm and dry.  Psychiatric: She has a normal mood and affect. Her behavior is normal. Judgment and thought content normal.       Assessment & Plan:   Problem List Items Addressed This Visit      Cardiovascular and Mediastinum   Essential hypertension - Primary    Blood pressure with improved control with Lopressor and amlodipine. Reports  no adverse side effects or hypotensive readings. Denies worse headache of life with no new symptoms of end organ damage noted on physical exam. Continue current dosage of amlodipine and metoprolol. Encouraged to follow low-sodium diet and to monitor blood pressure at home. Follow-up for nurse visit for blood pressure check      Relevant Medications   amLODipine (NORVASC) 10 MG tablet      I have discontinued Ms. Markgraf's GNP ASPIRIN. I am also having her maintain her nicotine polacrilex, aspirin, diphenhydrAMINE, metoprolol, carbamide peroxide, atorvastatin, oxybutynin, and amLODipine.   Meds ordered this encounter  Medications  . amLODipine (NORVASC) 10 MG tablet    Sig: Take 1 tablet (10  mg total) by mouth daily.    Dispense:  90 tablet    Refill:  0    Please fill at next request for refill.    Order Specific Question:   Supervising Provider    Answer:   Pricilla Holm A [[id number]]     Follow-up: Return in about 3 months (around 04/29/2017), or if symptoms worsen or fail to improve.  Mauricio Po, FNP

## 2017-01-29 LAB — CUP PACEART REMOTE DEVICE CHECK
MDC IDC PG IMPLANT DT: 20170919
MDC IDC SESS DTM: 20180318233738

## 2017-02-18 ENCOUNTER — Ambulatory Visit (INDEPENDENT_AMBULATORY_CARE_PROVIDER_SITE_OTHER): Payer: Medicare HMO | Admitting: *Deleted

## 2017-02-18 DIAGNOSIS — I639 Cerebral infarction, unspecified: Secondary | ICD-10-CM | POA: Diagnosis not present

## 2017-02-19 ENCOUNTER — Other Ambulatory Visit: Payer: Self-pay | Admitting: Family

## 2017-02-19 NOTE — Progress Notes (Signed)
[facility] Summary Report / Loop Recorder 

## 2017-03-05 LAB — CUP PACEART REMOTE DEVICE CHECK
Implantable Pulse Generator Implant Date: 20170919
MDC IDC SESS DTM: 20180417234118

## 2017-03-20 ENCOUNTER — Ambulatory Visit (INDEPENDENT_AMBULATORY_CARE_PROVIDER_SITE_OTHER): Payer: Medicare HMO | Admitting: *Deleted

## 2017-03-20 DIAGNOSIS — I639 Cerebral infarction, unspecified: Secondary | ICD-10-CM

## 2017-03-21 NOTE — Progress Notes (Signed)
[facility] Summary Report / Loop Recorder 

## 2017-03-24 IMAGING — CT CT CHEST W/O CM
2 of 3 series · 15 of 36 positions shown, 18 images · non-contrast
Comparison: CT 07/22/2016

CLINICAL DATA: Followup pulmonary nodule

EXAM:
CT CHEST WITHOUT CONTRAST
TECHNIQUE: Multidetector CT imaging of the chest was performed following the
standard protocol without IV contrast.

[Series 2: thorax · axial · 0.78mm/px · z∈[-306,-38]mm · 12 of 158 slices shown, 15 images]
[im 12/158  mediastinal]
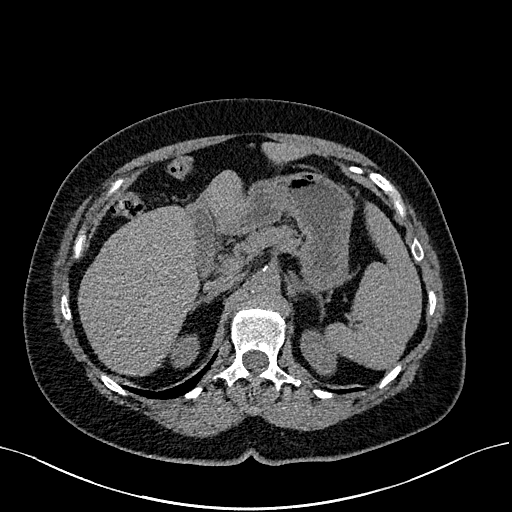
[im 12/158  lung]
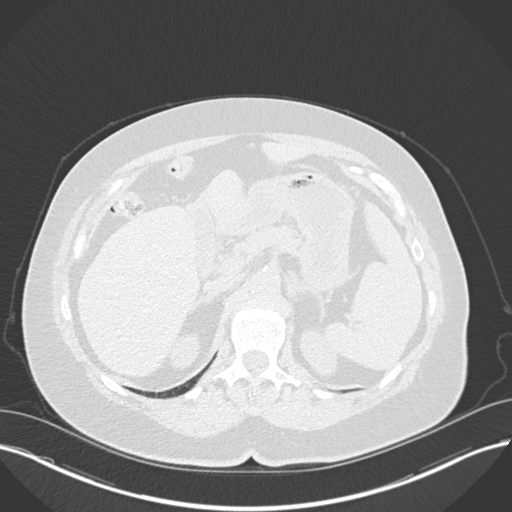
[im 24/158  lung]
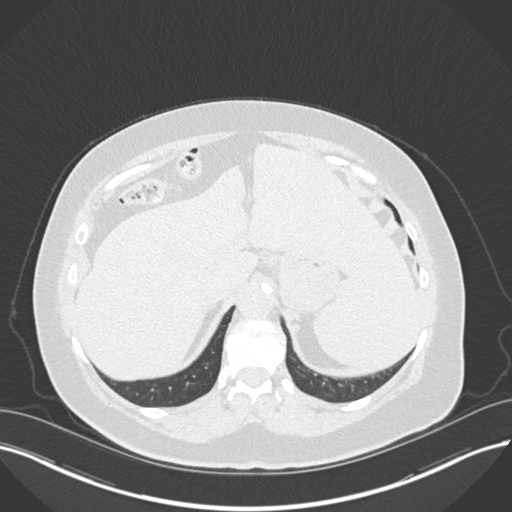
[im 35/158  lung]
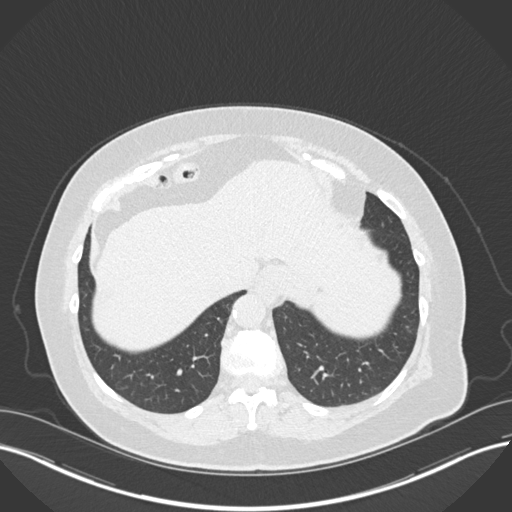
[im 47/158  lung]
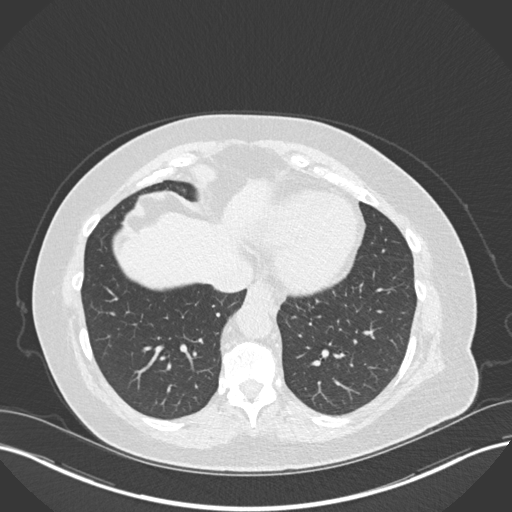
[im 59/158  mediastinal]
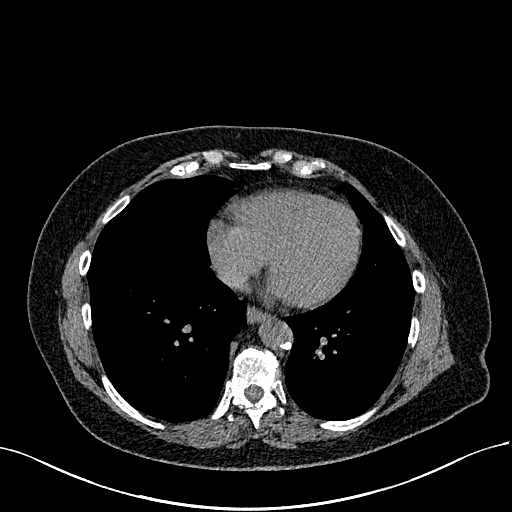
[im 59/158  lung]
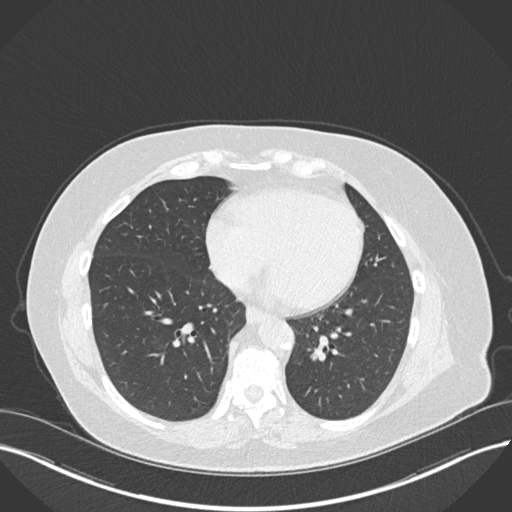
[im 70/158  lung]
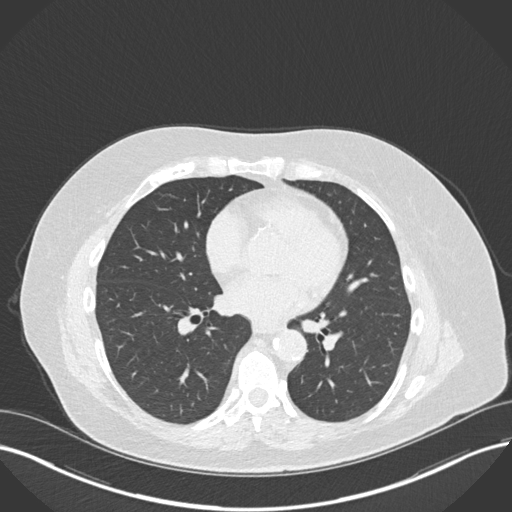
[im 88/158  lung]
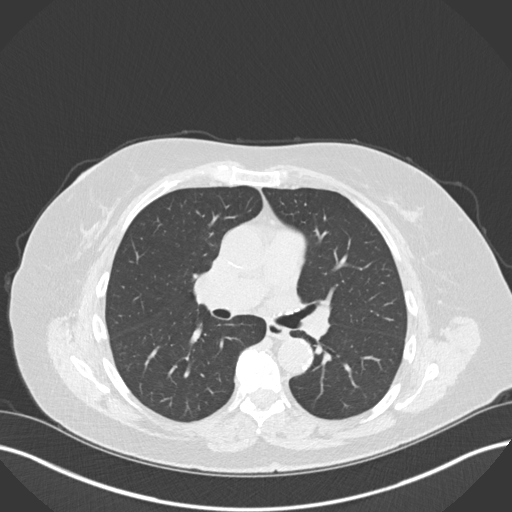
[im 99/158  lung]
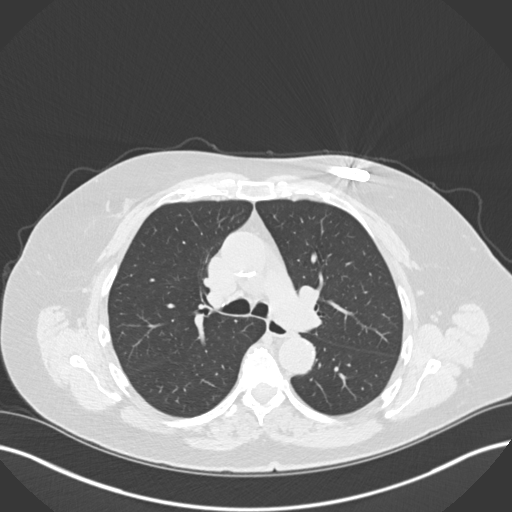
[im 111/158  mediastinal]
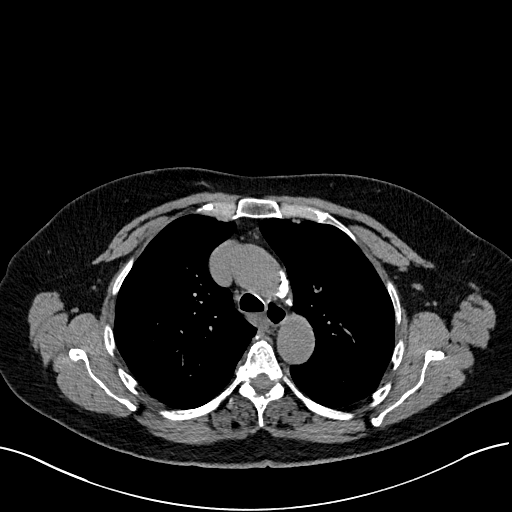
[im 111/158  lung]
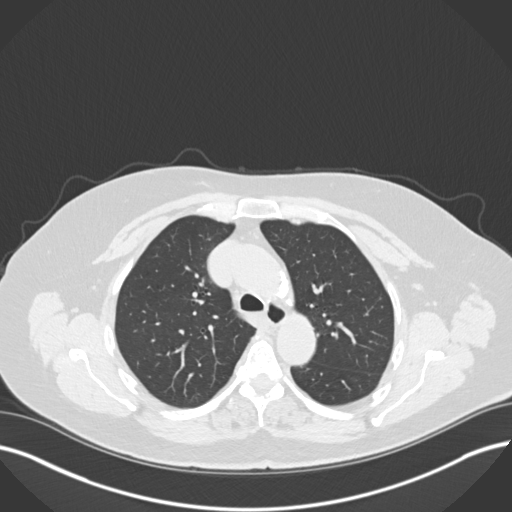
[im 123/158  lung]
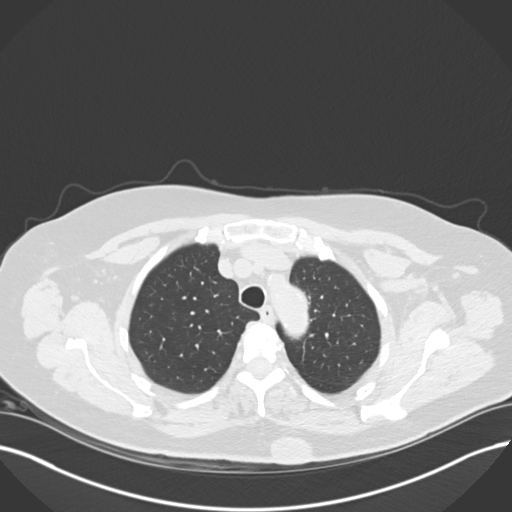
[im 134/158  lung]
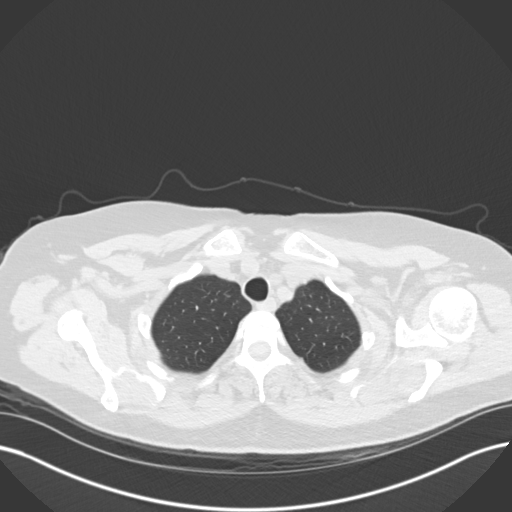
[im 146/158  lung]
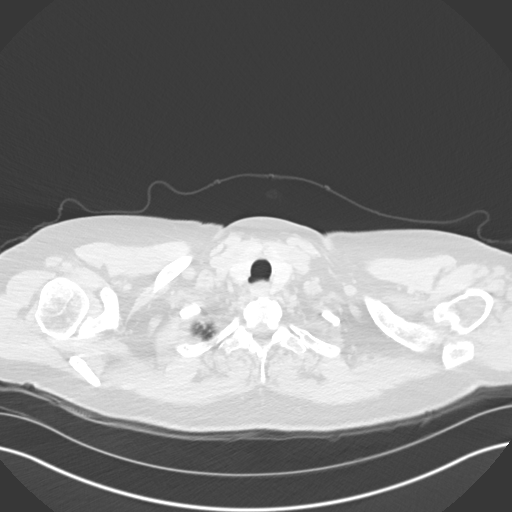

[Series 5: coronal · coronal · 0.62mm/px · 3 of 120 slices shown]
[im 24/120  lung]
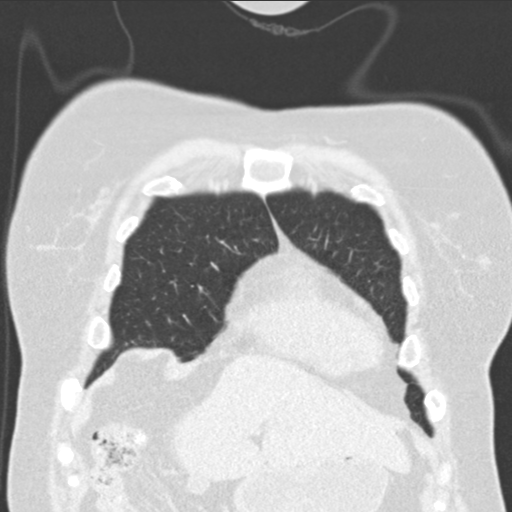
[im 48/120  lung]
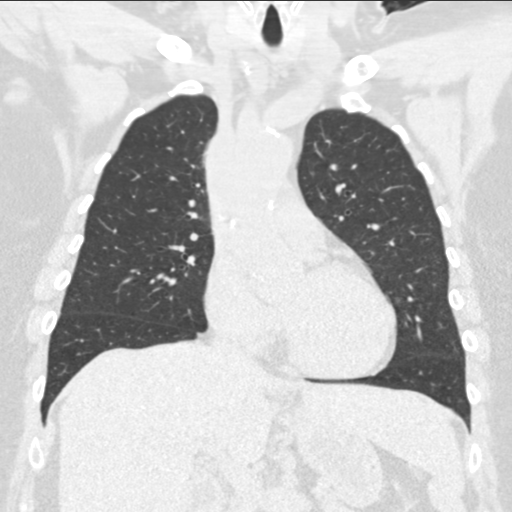
[im 72/120  lung]
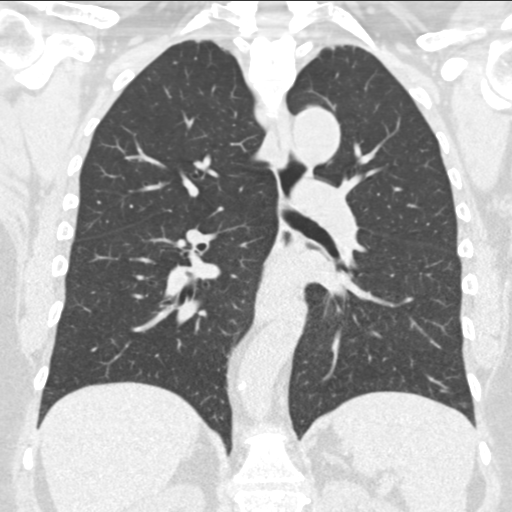

[15 of 36 positions shown; findings below may reference images not displayed]

FINDINGS: Cardiovascular: Coronary artery calcification and aortic
atherosclerotic calcification.

Mediastinum/Nodes: No axillary supraclavicular adenopathy. No
mediastinal hilar adenopathy. No pericardial fluid.

Lungs/Pleura: Ground-glass nodule in the lateral aspect of the RIGHT
upper lobe measures 14 mm 8 mm go unchanged from 13 mm x 9 mm.
Lesion has an irregular border suspicious for lepidic growth (image
41, series 3). No new pulmonary nodule.

Upper Abdomen: Limited view of the liver, kidneys, pancreas are
unremarkable. Normal adrenal glands.

Musculoskeletal: Degenerative osteophytosis of the spine.
Benign-appearing subcutaneous lesion in the mid LEFT back measures 3
cm.

[EMAIL]
[EMAIL]

1. Stable ground-glass nodule in the RIGHT upper lobe over 2 month
interval. Lesion has suspicious morphology for a low-grade
adenocarcinoma (atypical adenomatous hyperplasia) but nonspecific.
Recommend follow-up CT in 3-6 months and if lesion persists or
enlarges consider biopsy / resection. Per Fleischner criteria.
2. Coronary artery calcification and aortic atherosclerotic
calcification.
These results will be called to the ordering clinician or
representative by the Radiologist Assistant, and communication
documented in the PACS or zVision Dashboard.

## 2017-03-31 LAB — CUP PACEART REMOTE DEVICE CHECK
Date Time Interrogation Session: 20180518000712
MDC IDC PG IMPLANT DT: 20170919

## 2017-04-02 ENCOUNTER — Encounter: Payer: Self-pay | Admitting: Internal Medicine

## 2017-04-09 ENCOUNTER — Ambulatory Visit (INDEPENDENT_AMBULATORY_CARE_PROVIDER_SITE_OTHER): Payer: Medicare HMO | Admitting: Nurse Practitioner

## 2017-04-09 ENCOUNTER — Encounter: Payer: Self-pay | Admitting: Nurse Practitioner

## 2017-04-09 VITALS — BP 127/75 | HR 55 | Ht 62.0 in | Wt 181.4 lb

## 2017-04-09 DIAGNOSIS — G459 Transient cerebral ischemic attack, unspecified: Secondary | ICD-10-CM | POA: Diagnosis not present

## 2017-04-09 DIAGNOSIS — I10 Essential (primary) hypertension: Secondary | ICD-10-CM

## 2017-04-09 DIAGNOSIS — I639 Cerebral infarction, unspecified: Secondary | ICD-10-CM | POA: Diagnosis not present

## 2017-04-09 NOTE — Patient Instructions (Signed)
Stressed the importance of management of risk factors to prevent further stroke Continue Aspirin for secondary stroke prevention Maintain strict control of hypertension with blood pressure goal below 130/90, today's reading 127/75 continue antihypertensive medications Control of diabetes with hemoglobin A1c below 6.5 followed by primary care most recent hemoglobin A1c5.4 Cholesterol with LDL cholesterol less than 70, followed by primary care,  most recent 30 continue statin drugs Lipitor Exercise by walking, slowly increase eat healthy diet with whole grains,  fresh fruits and vegetables Congratulated on smoking cessation Follow up in 4 months

## 2017-04-09 NOTE — Progress Notes (Signed)
[facility]  PATIENT: Kathleen Guerrero DOB: 08/21/1946   REASON FOR VISIT: Hospital follow-up for TIA HISTORY FROM: Patient    HISTORY OF PRESENT ILLNESS:(per record)Kathleen Guerrero a 71 y.o.femalewho fell asleep inside a car for approx 1-1.5h and when she woke up she started having difficulty coming up with the right words to say. This lasted for ever 30 minutes and then slowly resolved. By the time she presented to out ED all her symptoms had resolved and her NIHSS was zero. Therefore the code stroke was called off. Her only other complaint was blurred vision, which was monocular in right eye. It should be noted that pt has been very hypertensive in the last few days and that her doctor started a new medicine yesterday - pt does not remember the name of that medicine. She did not receive IV TPA due to resolution of deficits. MRI no acute intracranial infarct carotid Doppler unremarkable 2-D echo 60-65%. Loop recorder interrogation of atrial fibrillation. LDL 30 hemoglobin A1c 5.4 04/09/17 CMShe returns to the stroke clinic today for follow-up she remains on aspirin for secondary stroke prevention without further stroke TIA symptoms she has minimal bruising no bleeding. She remains on Lipitor without myalgias. Blood pressure in the office today 127/75. She does not exercise. She quit smoking. She returns for reevaluation  10/09/16 PSMs Kathleen Guerrero is a 85 year Caucasian lady who is seen today for first office follow-up visit after hospital admission for stroke in September 2017. Kathleen Guerrero a 71 y.o.femalewent to bed sometime between 9 and 10 last night, woke in the middle the night with left leg weakness.She states that she tossed and turned, and finally came in the emergency department today after calling her friend. She states that it seems to be a stable deficits started. An MRI was performed in the emergency department which shows a subcortical stroke on the  right. LKW: 9 PM 9/15 tpa given?: no, out of window. CT scan of the head showed no acute abnormality and CT angiogram showed no significant large vessel extracranial or intracranial stenosis. An incidental finding of 15 mm right upper lobe pulmonary noted was noted. CT scan of the chest was done to follow this and etiology of this is indeterminate. MRI scan of the brain subsequently showed 1.3 cm right corona radiata infarct near the motor strip. Etiology of this was indeterminate lacunar versus cryptogenic hence patient underwent transesophageal echocardiogram which showed no cardiac source of embolism. Transthoracic echo showed normal ejection fraction. Patient had loop recorder inserted and so to fibrillation have not yet been found. Area cholesterol 95 mg percent and hemoglobin A1c was 5.7. She'll start an aspirin and Lipitor. Patient has finished physical and occupation therapy. She has quit smoking. She is tolerating aspirin well with only minor bruising. She is eating healthy and walks 30 minutes every day and started doing yoga. She hasn't fact lost 9 pounds and is quite proud of this fact. She has seen pulmonologist [name] who did a follow-up CT scan chest which shows stable appearance of the nodule and he has recommended conservative follow-up for now. Patient's blood pressure is elevated today in office in 171/99 and she blames this on an excitement of the visit. I counseled the patient to keep track of her blood pressure regularly and discuss medication treatment when she sees her primary care physician next. REVIEW OF SYSTEMS: Full 14 system review of systems performed and notable only for those listed, all others are neg:  Constitutional: neg  Cardiovascular: neg Ear/Nose/Throat: neg  Skin: neg Eyes: neg Respiratory: neg Gastroitestinal: neg  Hematology/Lymphatic: neg  Endocrine: neg Musculoskeletal:neg Allergy/Immunology: neg Neurological: neg Psychiatric: neg Sleep :  neg   ALLERGIES: No Known Allergies  HOME MEDICATIONS: Outpatient Medications Prior to Visit  Medication Sig Dispense Refill  . amLODipine (NORVASC) 10 MG tablet TAKE 1 TABLET BY MOUTH EVERY DAY 30 tablet 3  . aspirin 325 MG tablet TAKE 1 TABLET BY MOUTH EVERY DAY 30 tablet 11  . atorvastatin (LIPITOR) 40 MG tablet TAKE 1 TABLET BY MOUTH EVERY DAY at 6 p.m. 90 tablet 1  . carbamide peroxide (DEBROX) 6.5 % otic solution Place 5 drops into both ears 2 (two) times daily. 15 mL 0  . diphenhydrAMINE (BENADRYL) 25 MG tablet Take 25 mg by mouth at bedtime.     . metoprolol (LOPRESSOR) 50 MG tablet Take 1 tablet (50 mg total) by mouth 2 (two) times daily. 60 tablet 11  . nicotine polacrilex ([employer] NICOTINE) 2 MG lozenge Take 2 mg by mouth as needed for smoking cessation.    Marland Kitchen oxybutynin (DITROPAN-XL) 5 MG 24 hr tablet TAKE 1 TABLET BY MOUTH AT BEDTIME 90 tablet 0  . amLODipine (NORVASC) 10 MG tablet Take 1 tablet (10 mg total) by mouth daily. 90 tablet 0   No facility-administered medications prior to visit.     PAST MEDICAL HISTORY: Past Medical History:  Diagnosis Date  . Hypertension   . Stroke ([place])   . Tobacco use     PAST SURGICAL HISTORY: Past Surgical History:  Procedure Laterality Date  . DERMOID CYST  EXCISION    . EP IMPLANTABLE DEVICE N/A 07/23/2016   Procedure: Loop Recorder Insertion;  Surgeon: Thompson Grayer, MD;  Location: [facility];  Service: Cardiovascular;  Laterality: N/A;  . Intestinal obstruction    . TEE WITHOUT CARDIOVERSION N/A 07/23/2016   Procedure: TRANSESOPHAGEAL ECHOCARDIOGRAM (TEE);  Surgeon: Lelon Perla, MD;  Location: [facility];  Service: Cardiovascular;  Laterality: N/A;    FAMILY HISTORY: Family History  Problem Relation Age of Onset  . Kidney disease Maternal Grandmother   . Cancer Maternal Grandfather     SOCIAL HISTORY: Social History   Social History  . Marital status: Divorced    Spouse name: N/A  . Number of children: 1   . Years of education: 19   Occupational History  . Retired    Social History Main Topics  . Smoking status: Former Smoker    Packs/day: 1.00    Years: 50.00    Types: Cigarettes    Quit date: 07/20/2016  . Smokeless tobacco: Never Used  . Alcohol use Yes     Comment: Rarely  . Drug use: No  . Sexual activity: Not on file   Other Topics Concern  . Not on file   Social History Narrative   Fun: Read   Denies abuse and feels safe at home.      PHYSICAL EXAM  Vitals:   04/09/17 0917  BP: 127/75  Pulse: (!) 55  Weight: 181 lb 6.4 oz (82.3 kg)  Height: 5\' 2"  (1.575 m)   Body mass index is 33.18 kg/m.  Generalized: Well developed, Obese female in no acute distress  Head: normocephalic and atraumatic,. Oropharynx benign  Neck: Supple, no carotid bruits  Cardiac: Regular rate rhythm, no murmur  Musculoskeletal: No deformity   Neurological examination   Mentation: Alert oriented to time, place, history taking. Attention span and concentration appropriate.  Recent and remote memory intact.  Follows all commands speech and language fluent.   Cranial nerve II-XII: Fundoscopic exam reveals sharp disc margins.Pupils were equal round reactive to light extraocular movements were full, visual field were full on confrontational test. Facial sensation and strength were normal. hearing was intact to finger rubbing bilaterally. Uvula tongue midline. head turning and shoulder shrug were normal and symmetric.Tongue protrusion into cheek strength was normal. Motor: normal bulk and tone, full strength in the BUE, BLE, fine finger movements normal, no pronator drift. No focal weakness Sensory: normal and symmetric to light touch, pinprick, and  Vibration, in the upper and lower extremities Coordination: finger-nose-finger, heel-to-shin bilaterally, no dysmetria Reflexes: 1+ upper lower and symmetric, plantar responses were flexor bilaterally. Gait and Station: Rising up from seated position  without assistance, normal stance,  moderate stride, good arm swing, smooth turning, able to perform tiptoe, and heel walking without difficulty. Tandem gait is mildly unsteady  DIAGNOSTIC DATA (LABS, IMAGING, TESTING) - I reviewed patient records, labs, notes, testing and imaging myself where available.  Lab Results  Component Value Date   WBC 7.5 01/16/2017   HGB 13.6 01/16/2017   HCT 40.0 01/16/2017   MCV 91.2 01/16/2017   PLT 221 01/16/2017      Component Value Date/Time   NA 142 01/16/2017 1751   K 3.9 01/16/2017 1751   CL 100 (L) 01/16/2017 1751   CO2 28 01/16/2017 1743   GLUCOSE 98 01/16/2017 1751   BUN 18 01/16/2017 1751   CREATININE 1.30 (H) 01/16/2017 1751   CALCIUM 9.7 01/16/2017 1743   PROT 7.1 01/16/2017 1743   ALBUMIN 3.9 01/16/2017 1743   AST 22 01/16/2017 1743   ALT 27 01/16/2017 1743   ALKPHOS 50 01/16/2017 1743   BILITOT 0.5 01/16/2017 1743   GFRNONAA 47 (L) 01/16/2017 1743   GFRAA 54 (L) 01/16/2017 1743   Lab Results  Component Value Date   CHOL 93 01/17/2017   HDL 40 (L) 01/17/2017   LDLCALC 30 01/17/2017   TRIG 113 01/17/2017   CHOLHDL 2.3 01/17/2017   Lab Results  Component Value Date   HGBA1C 5.4 01/17/2017    ASSESSMENT AND PLAN 38 year Caucasian lady with right brain subcortical infarct in September 2017 etiology indeterminate lacunar versus cryptogenic given large size. Vascular risk factors of hyperlipidemia only. Readmitted for TIA in March 2018   PLAN: Stressed the importance of management of risk factors to prevent further stroke Continue Aspirin for secondary stroke prevention Maintain strict control of hypertension with blood pressure goal below 130/90, today's reading 127/75 continue antihypertensive medications Control of diabetes with hemoglobin A1c below 6.5 followed by primary care most recent hemoglobin A1c5.4 Cholesterol with LDL cholesterol less than 70, followed by primary care,  most recent 30 continue statin drugs  Lipitor Exercise by walking, slowly increase eat healthy diet with whole grains,  fresh fruits and vegetables Congratulated on smoking cessation Follow up in 4 months Discussed risk for recurrent stroke/ TIA and answered additional questions This was a  visit requiring 30 minutes and medical decision making of high complexity with extensive review of history, hospital chart, counseling and answering questions Dennie Bible, [facility], [facility], APRN  [facility] [address], [place], [place] [address] [phone]

## 2017-04-10 NOTE — Progress Notes (Signed)
I agree with the above plan 

## 2017-04-21 ENCOUNTER — Other Ambulatory Visit: Payer: Self-pay | Admitting: Family

## 2017-04-21 ENCOUNTER — Ambulatory Visit (INDEPENDENT_AMBULATORY_CARE_PROVIDER_SITE_OTHER): Payer: Medicare HMO | Admitting: *Deleted

## 2017-04-21 DIAGNOSIS — I639 Cerebral infarction, unspecified: Secondary | ICD-10-CM

## 2017-04-21 NOTE — Progress Notes (Signed)
[facility] Summary Report / Loop Recorder 

## 2017-04-29 LAB — CUP PACEART REMOTE DEVICE CHECK
Date Time Interrogation Session: 20180617002238
MDC IDC PG IMPLANT DT: 20170919

## 2017-04-30 ENCOUNTER — Encounter: Payer: Self-pay | Admitting: Family

## 2017-04-30 ENCOUNTER — Ambulatory Visit (INDEPENDENT_AMBULATORY_CARE_PROVIDER_SITE_OTHER): Payer: Medicare HMO | Admitting: Family

## 2017-04-30 ENCOUNTER — Other Ambulatory Visit (INDEPENDENT_AMBULATORY_CARE_PROVIDER_SITE_OTHER): Payer: Medicare HMO

## 2017-04-30 VITALS — BP 122/80 | HR 55 | Temp 99.2°F | Resp 16 | Ht 62.0 in | Wt 184.9 lb

## 2017-04-30 DIAGNOSIS — I10 Essential (primary) hypertension: Secondary | ICD-10-CM | POA: Diagnosis not present

## 2017-04-30 DIAGNOSIS — R600 Localized edema: Secondary | ICD-10-CM | POA: Diagnosis not present

## 2017-04-30 LAB — COMPREHENSIVE METABOLIC PANEL
ALBUMIN: 4 g/dL (ref 3.5–5.2)
ALT: 21 U/L (ref 0–35)
AST: 19 U/L (ref 0–37)
Alkaline Phosphatase: 57 U/L (ref 39–117)
BUN: 12 mg/dL (ref 6–23)
CHLORIDE: 103 meq/L (ref 96–112)
CO2: 31 mEq/L (ref 19–32)
CREATININE: 0.87 mg/dL (ref 0.40–1.20)
Calcium: 10 mg/dL (ref 8.4–10.5)
GFR: 68.59 mL/min (ref >60.00)
Glucose, Bld: 106 mg/dL — ABNORMAL HIGH (ref 70–99)
Potassium: 4 mEq/L (ref 3.5–5.1)
SODIUM: 139 meq/L (ref 135–145)
TOTAL PROTEIN: 7.2 g/dL (ref 6.0–8.3)
Total Bilirubin: 0.8 mg/dL (ref 0.2–1.2)

## 2017-04-30 NOTE — Progress Notes (Signed)
Subjective:    Patient ID: Kathleen Guerrero, female    DOB: [birth year], 71 y.o.   MRN: [id number]  Chief Complaint  Patient presents with  . Joint Swelling    ankle and feet swelling that she has noticed since last bp medication was added    HPI:  Kathleen Guerrero is a 71 y.o. female who  has a past medical history of Hypertension; Stroke ([place]); and Tobacco use. and presents today for an office visit.  This is a new problem. Associated symptom of swelling located in her bilateral ankles and feet have been going on since her previous blood pressure medication increase. Currently maintained on metoprolol and amlodipine. Course of the symptoms has worsened since onset. Denies any shortness of breath, chest pain or paroxsymal nocturnal dyspnea.    No Known Allergies    Outpatient Medications Prior to Visit  Medication Sig Dispense Refill  . amLODipine (NORVASC) 10 MG tablet TAKE 1 TABLET BY MOUTH EVERY DAY 30 tablet 3  . aspirin 325 MG tablet TAKE 1 TABLET BY MOUTH EVERY DAY 30 tablet 11  . atorvastatin (LIPITOR) 40 MG tablet TAKE 1 TABLET BY MOUTH EVERY DAY AT 6 p.m. 90 tablet 0  . carbamide peroxide (DEBROX) 6.5 % otic solution Place 5 drops into both ears 2 (two) times daily. 15 mL 0  . diphenhydrAMINE (BENADRYL) 25 MG tablet Take 25 mg by mouth at bedtime.     . metoprolol (LOPRESSOR) 50 MG tablet Take 1 tablet (50 mg total) by mouth 2 (two) times daily. 60 tablet 11  . nicotine polacrilex ([employer] NICOTINE) 2 MG lozenge Take 2 mg by mouth as needed for smoking cessation.    Marland Kitchen oxybutynin (DITROPAN-XL) 5 MG 24 hr tablet TAKE 1 TABLET BY MOUTH AT BEDTIME 90 tablet 0   No facility-administered medications prior to visit.      Past Medical History:  Diagnosis Date  . Hypertension   . Stroke ([place])   . Tobacco use      Review of Systems  Constitutional: Negative for chills and fever.  Eyes:       Negative for changes in vision  Respiratory: Negative for cough, chest tightness and  wheezing.   Cardiovascular: Negative for chest pain, palpitations and leg swelling.  Neurological: Negative for dizziness, weakness and light-headedness.      Objective:    BP 122/80 (BP Location: Left Arm, Patient Position: Sitting, Cuff Size: Large)   Pulse (!) 55   Temp 99.2 F (37.3 C) (Oral)   Resp 16   Ht 5\' 2"  (1.575 m)   Wt 184 lb 14.4 oz (83.9 kg)   SpO2 97%   BMI 33.82 kg/m  Nursing note and vital signs reviewed.  Physical Exam  Constitutional: She is oriented to person, place, and time. She appears well-developed and well-nourished. No distress.  Cardiovascular: Normal rate, regular rhythm, normal heart sounds and intact distal pulses.   Mild/moderate non-pitting bilateral lower extremity edema. Pulses intake and appropriate.   Pulmonary/Chest: Effort normal and breath sounds normal.  Neurological: She is alert and oriented to person, place, and time.  Skin: Skin is warm and dry.  Psychiatric: She has a normal mood and affect. Her behavior is normal. Judgment and thought content normal.       Assessment & Plan:   Problem List Items Addressed This Visit      Cardiovascular and Mediastinum   Essential hypertension - Primary    Blood pressure well-controlled and below goal  140/90 with current medication regimen. Does have lower extremity edema as described. Continue current dosage of amlodipine and metoprolol. Encouraged to monitor blood pressure at home and follow low-sodium diet. Continue to monitor.      Relevant Orders   Comprehensive metabolic panel (Completed)     Other   Lower extremity edema    Bilateral lower extremity edema with concern for medication side effect. Discussed options of changing medication and patient wishes to continue with amlodipine at this time as her blood pressure is well controlled. Treat conservatively with leg elevation, decrease sodium in diet, and compression socks as needed. She will also discontinue her oxybutynin. No evidence  of DVT, obtain CMET to check liver and kidney function.          I have discontinued Ms. Laskaris's oxybutynin. I am also having her maintain her nicotine polacrilex, aspirin, diphenhydrAMINE, metoprolol tartrate, carbamide peroxide, amLODipine, and atorvastatin.   Follow-up: Return in about 3 months (around 07/31/2017), or if symptoms worsen or fail to improve.  Mauricio Po, FNP

## 2017-04-30 NOTE — Assessment & Plan Note (Signed)
Bilateral lower extremity edema with concern for medication side effect. Discussed options of changing medication and patient wishes to continue with amlodipine at this time as her blood pressure is well controlled. Treat conservatively with leg elevation, decrease sodium in diet, and compression socks as needed. She will also discontinue her oxybutynin. No evidence of DVT, obtain CMET to check liver and kidney function.

## 2017-04-30 NOTE — Assessment & Plan Note (Signed)
Blood pressure well-controlled and below goal 140/90 with current medication regimen. Does have lower extremity edema as described. Continue current dosage of amlodipine and metoprolol. Encouraged to monitor blood pressure at home and follow low-sodium diet. Continue to monitor.

## 2017-04-30 NOTE — Patient Instructions (Signed)
Thank you for choosing [facility].  SUMMARY AND INSTRUCTIONS:  Please stop taking the oxybutinin.   Elevate your legs when seated.  Decrease sodium intake.  Compression socks as needed.  Follow up if symptoms do not improve.   Medication:  Your prescription(s) have been submitted to your pharmacy or been printed and provided for you. Please take as directed and contact our office if you believe you are having problem(s) with the medication(s) or have any questions.  Labs:  Please stop by the lab on the lower level of the building for your blood work. Your results will be released to [facility] (or called to you) after review, usually within 72 hours after test completion. If any changes need to be made, you will be notified at that same time.  1.) The lab is open from 7:30am to 5:30 pm Monday-Friday 2.) No appointment is necessary 3.) Fasting (if needed) is 6-8 hours after food and drink; black coffee and water are okay   Follow up:  If your symptoms worsen or fail to improve, please contact our office for further instruction, or in case of emergency go directly to the emergency room at the closest medical facility.

## 2017-05-16 ENCOUNTER — Other Ambulatory Visit: Payer: Self-pay | Admitting: Internal Medicine

## 2017-05-16 DIAGNOSIS — R911 Solitary pulmonary nodule: Secondary | ICD-10-CM

## 2017-05-19 ENCOUNTER — Ambulatory Visit (INDEPENDENT_AMBULATORY_CARE_PROVIDER_SITE_OTHER): Payer: Medicare HMO | Admitting: *Deleted

## 2017-05-19 DIAGNOSIS — I639 Cerebral infarction, unspecified: Secondary | ICD-10-CM | POA: Diagnosis not present

## 2017-05-20 NOTE — Progress Notes (Signed)
[facility] Summary Report / Loop Recorder 

## 2017-05-29 LAB — CUP PACEART REMOTE DEVICE CHECK
MDC IDC PG IMPLANT DT: 20170919
MDC IDC SESS DTM: 20180717003945

## 2017-05-29 NOTE — Progress Notes (Signed)
Carelink summary report received. Battery status OK. Normal device function. No new symptom episodes, tachy episodes, brady, or pause episodes. No new AF episodes. Monthly summary reports and ROV/PRN 

## 2017-06-12 ENCOUNTER — Encounter: Payer: Self-pay | Admitting: Family

## 2017-06-12 ENCOUNTER — Telehealth: Payer: Self-pay | Admitting: Family

## 2017-06-12 ENCOUNTER — Ambulatory Visit (INDEPENDENT_AMBULATORY_CARE_PROVIDER_SITE_OTHER): Payer: Medicare HMO | Admitting: Family

## 2017-06-12 VITALS — BP 130/84 | HR 65 | Temp 99.2°F | Resp 16 | Ht 62.0 in | Wt 185.8 lb

## 2017-06-12 DIAGNOSIS — I10 Essential (primary) hypertension: Secondary | ICD-10-CM

## 2017-06-12 NOTE — Progress Notes (Signed)
Subjective:    Patient ID: Kathleen Guerrero, female    DOB: [birth year], 71 y.o.   MRN: [id number]  Chief Complaint  Patient presents with  . Hypertension    woke up with a headache this morning, checked BP 178/96, states she his not really feeling well today     HPI:  Kathleen Guerrero is a 71 y.o. female who  has a past medical history of Hypertension; Stroke ([place]); and Tobacco use. and presents today for an acute office visit.  Hypertension -  Currently maintained on metoprolol and amlodipine. Reports taking medication as prescribed and denies adverse side effects. We'll cup this morning with a headache and noted her blood pressure to be 178/96. She called the triage service with concern that she has had previous stroke. During the conversation she was noted be a little lightheaded. Her blood pressure improved 147/96. Not currently checked blood pressures at home. Does have a slight headache but denies worst headache of life. Working on a low sodium diet.   BP Readings from Last 3 Encounters:  06/12/17 130/84  04/30/17 122/80  04/09/17 127/75   .  No Known Allergies    Outpatient Medications Prior to Visit  Medication Sig Dispense Refill  . amLODipine (NORVASC) 10 MG tablet TAKE 1 TABLET BY MOUTH EVERY DAY 30 tablet 3  . aspirin 325 MG tablet TAKE 1 TABLET BY MOUTH EVERY DAY 30 tablet 11  . atorvastatin (LIPITOR) 40 MG tablet TAKE 1 TABLET BY MOUTH EVERY DAY AT 6 p.m. 90 tablet 0  . carbamide peroxide (DEBROX) 6.5 % otic solution Place 5 drops into both ears 2 (two) times daily. 15 mL 0  . diphenhydrAMINE (BENADRYL) 25 MG tablet Take 25 mg by mouth at bedtime.     . metoprolol (LOPRESSOR) 50 MG tablet Take 1 tablet (50 mg total) by mouth 2 (two) times daily. 60 tablet 11  . nicotine polacrilex ([employer] NICOTINE) 2 MG lozenge Take 2 mg by mouth as needed for smoking cessation.     No facility-administered medications prior to visit.      Past Medical History:  Diagnosis Date  .  Hypertension   . Stroke ([place])   . Tobacco use     Review of Systems  Constitutional: Negative for chills and fever.  Eyes:       Negative for changes in vision  Respiratory: Negative for cough, chest tightness and wheezing.   Cardiovascular: Negative for chest pain, palpitations and leg swelling.  Neurological: Negative for dizziness, weakness and light-headedness.      Objective:    BP 130/84 (BP Location: Left Arm, Patient Position: Sitting, Cuff Size: Large)   Pulse 65   Temp 99.2 F (37.3 C) (Oral)   Resp 16   Ht 5\' 2"  (1.575 m)   Wt 185 lb 12.8 oz (84.3 kg)   SpO2 96%   BMI 33.98 kg/m  Nursing note and vital signs reviewed.  Physical Exam  Constitutional: She is oriented to person, place, and time. She appears well-developed and well-nourished. No distress.  Cardiovascular: Normal rate, regular rhythm, normal heart sounds and intact distal pulses.   Pulmonary/Chest: Effort normal and breath sounds normal.  Neurological: She is alert and oriented to person, place, and time.  Skin: Skin is warm and dry.  Psychiatric: She has a normal mood and affect. Her behavior is normal. Judgment and thought content normal.       Assessment & Plan:   Problem List Items Addressed This  Visit      Cardiovascular and Mediastinum   Essential hypertension - Primary    Mild increase and blood pressure earlier today with mild headache and no other symptoms of end organ damage. Denies worse headache of life. Normal neurological exam and blood pressure appears adequately controlled. Continue current dosage of metoprolol and amlodipine. Does have mild lower extremity edema. Encouraged to monitor blood pressure at home and follow low-sodium diet.          I am having Ms. Sonn maintain her nicotine polacrilex, aspirin, diphenhydrAMINE, metoprolol tartrate, carbamide peroxide, amLODipine, and atorvastatin.   Follow-up: Return in about 3 months (around 09/12/2017), or if symptoms worsen  or fail to improve.  Mauricio Po, FNP

## 2017-06-12 NOTE — Patient Instructions (Addendum)
Thank you for choosing [facility].  SUMMARY AND INSTRUCTIONS:  Continue to take your medication as prescribed.  Monitor your blood pressure at home periodically.  Watch your sodium intake.    Follow up:  If your symptoms worsen or fail to improve, please contact our office for further instruction, or in case of emergency go directly to the emergency room at the closest medical facility.

## 2017-06-12 NOTE — Telephone Encounter (Signed)
Patient Name: Kathleen Guerrero  DOB: [birth year]    Initial Comment Caller states she's being treated for high BP- 177/96. She's having head pain. She had a stroke last September.   Nurse Assessment  Nurse: Raphael Gibney, RN, Vanita Ingles Date/Time (Eastern Time): 06/12/2017 10:38:36 AM  Confirm and document reason for call. If symptomatic, describe symptoms. ---Caller states she is being treated for HTN. BP 177/96 earlier. BP 147/96. Has a headache which was severe earlier and it is better now. She is a little lightheaded. has taken ASA and metoprolol today.  Does the patient have any new or worsening symptoms? ---Yes  Will a triage be completed? ---Yes  Related visit to physician within the last 2 weeks? ---No  Does the PT have any chronic conditions? (i.e. diabetes, asthma, etc.) ---Yes  List chronic conditions. ---HTN  Is this a behavioral health or substance abuse call? ---No     Guidelines    Guideline Title Affirmed Question Affirmed Notes  High Blood Pressure Systolic BP >= 737 OR Diastolic >= 106    Final Disposition User   See PCP When Office is Open (within 3 days) Raphael Gibney, RN, Vanita Ingles    Comments  appt scheduled for 06/12/2017 at 11:15 am with Dr. Mauricio Po   Referrals  REFERRED TO PCP OFFICE   Disagree/Comply: Comply

## 2017-06-12 NOTE — Assessment & Plan Note (Addendum)
Mild increase and blood pressure earlier today with mild headache and no other symptoms of end organ damage. Denies worse headache of life. Normal neurological exam and blood pressure appears adequately controlled. Continue current dosage of metoprolol and amlodipine. Does have mild lower extremity edema. Encouraged to monitor blood pressure at home and follow low-sodium diet.

## 2017-06-16 ENCOUNTER — Telehealth: Payer: Self-pay | Admitting: Family

## 2017-06-16 NOTE — Telephone Encounter (Signed)
Notified pt rx ready for pick-up.../lmb pt w/Greg response...Johny Chess

## 2017-06-16 NOTE — Telephone Encounter (Signed)
Patient states that her bp since last OV has been in the 130's.  States that her BP this morning is 178/84.  States this reading is before she has taken her bp medication.  Patient would like a call back with advise on what to do.

## 2017-06-16 NOTE — Telephone Encounter (Signed)
Continue to monitor and if the AVERAGE blood pressure is elevated we will make adjustments. Also she may need to verify her cuff readings with a nurse visit.

## 2017-06-18 ENCOUNTER — Ambulatory Visit (INDEPENDENT_AMBULATORY_CARE_PROVIDER_SITE_OTHER): Payer: Medicare HMO | Admitting: *Deleted

## 2017-06-18 DIAGNOSIS — I639 Cerebral infarction, unspecified: Secondary | ICD-10-CM | POA: Diagnosis not present

## 2017-06-19 ENCOUNTER — Other Ambulatory Visit: Payer: Self-pay | Admitting: Family

## 2017-06-24 NOTE — Telephone Encounter (Signed)
Error

## 2017-06-26 LAB — CUP PACEART REMOTE DEVICE CHECK
Date Time Interrogation Session: 20180816014035
MDC IDC PG IMPLANT DT: 20170919

## 2017-06-26 NOTE — Progress Notes (Signed)
Loop recorder summary report

## 2017-07-08 ENCOUNTER — Other Ambulatory Visit: Payer: Medicare HMO

## 2017-07-09 ENCOUNTER — Ambulatory Visit (INDEPENDENT_AMBULATORY_CARE_PROVIDER_SITE_OTHER)
Admission: RE | Admit: 2017-07-09 | Discharge: 2017-07-09 | Disposition: A | Discharge status: Home / Self Care | Payer: Medicare HMO | Source: Ambulatory Visit | Attending: Internal Medicine | Admitting: Internal Medicine

## 2017-07-09 ENCOUNTER — Encounter: Payer: Self-pay | Admitting: Internal Medicine

## 2017-07-09 ENCOUNTER — Ambulatory Visit (INDEPENDENT_AMBULATORY_CARE_PROVIDER_SITE_OTHER): Payer: Medicare HMO | Admitting: Internal Medicine

## 2017-07-09 VITALS — BP 116/80 | HR 66 | Ht 62.75 in | Wt 190.0 lb

## 2017-07-09 DIAGNOSIS — R911 Solitary pulmonary nodule: Secondary | ICD-10-CM

## 2017-07-09 DIAGNOSIS — Z23 Encounter for immunization: Secondary | ICD-10-CM | POA: Diagnosis not present

## 2017-07-09 NOTE — Progress Notes (Signed)
Subjective:     Patient ID: Kathleen Guerrero, female   DOB: [birth year],   MRN: [id number]     Brief patient profile:  73 yowf quit smoking 07/2016 p admit for cva / left leg weakness resolved with PT but referred to pulmonary clinic 09/06/2016 by Dr   Leonie Man for incidental SPN     History of Present Illness  09/06/2016 1st [place] [facility] office visit/ Dyllan Hughett   Chief Complaint  Patient presents with  . Pulmonary Consult    Referred by Dr Mauricio Po for eval of pulmonary nodule. The pt denies any respiratory co's today.   Not limited by breathing from desired activities  rec Please see patient coordinator before you leave today  to schedule  a non contrasted CT chest after Thanksgiving and I will call you with the next step    bp elevation noted, rec avoid salt and self monitor with f/u with PC    CT chest 01/02/17 Stable 8 x 14 mm ground-glass pulmonary nodule in peripheral right upper lobe. This remains suspicious for low-grade adenocarcinoma. Thoracic surgical consultation is recommended for consideration of biopsy or surgical resection.  No evidence of lymphadenopathy or pleural effusion.  Incidental findings including aortic and coronary atherosclerosis, tiny hiatal hernia, and left nephrolithiasis.    01/02/2017  f/u ov/Angel Weedon re:  RUL spn /hbp Chief Complaint  Patient presents with  . Follow-up    CT Chest repeated this am.    Not limited by breathing from desired activities  / still not smoking / declined referral to T surgery  rec Start lopressor 25 mg twice daily until you see your Primary care provider   07/09/2017  f/u ov/Drezden Seitzinger re: RUL spn with serial growth  Chief Complaint  Patient presents with  . Follow-up    Here to review ct chest done this morning. Pt states doing well and no new co's.    does housework, no HC parking, one flight of steps ok but not regular walking  No obvious   excess/ purulent sputum or mucus plugs or hemoptysis or cp or chest tightness,  subjective wheeze or overt sinus or hb symptoms. No unusual exp hx or h/o childhood pna/ asthma or knowledge of premature birth.  Sleeping ok without nocturnal  or early am exacerbation  of respiratory  c/o's or need for noct saba. Also denies any obvious fluctuation of symptoms with weather or environmental changes or other aggravating or alleviating factors except as outlined above   Current Medications, Allergies, Complete Past Medical History, Past Surgical History, Family History, and Social History were reviewed in [employer] record.  ROS  The following are not active complaints unless bolded sore throat, dysphagia, dental problems, itching, sneezing,  nasal congestion or excess/ purulent secretions, ear ache,   fever, chills, sweats, unintended wt loss, classically pleuritic or exertional cp,  orthopnea pnd or leg swelling, presyncope, palpitations, abdominal pain, anorexia, nausea, vomiting, diarrhea  or change in bowel or bladder habits, change in stools or urine, dysuria,hematuria,  rash, arthralgias, visual complaints, headache, numbness, weakness or ataxia or problems with walking or coordination,  change in mood/affect or memory.                  Objective:   Physical Exam   amb slt anxious wf nad   07/09/2017         190   01/02/2017        177  09/06/16 176 lb (79.8 kg)  07/26/16  182 lb (82.6 kg)  07/22/16 192 lb 4.8 oz (87.2 kg)    Vital signs reviewed - - Note on arrival 02 sats  95% on RA    HEENT: nl dentition, turbinates, and oropharynx. Nl external ear canals without cough reflex   NECK :  without JVD/Nodes/TM/ nl carotid upstrokes bilaterally   LUNGS: no acc muscle use,  Nl contour chest which is clear to A and P bilaterally without cough on insp or exp maneuvers   CV:  RRR  no s3 or murmur or increase in P2, no edema   ABD:  soft and nontender with nl inspiratory excursion in the supine position. No bruits or organomegaly, bowel  sounds nl  MS:  Nl gait/ ext warm without deformities, calf tenderness, cyanosis or clubbing No obvious joint restrictions   SKIN: warm and dry with silver dollar sized lipoma high T spine   NEURO:  alert, approp, nl sensorium with  no motor deficits      I personally reviewed images and agree with radiology impression as follows:  CT  Chest     07/09/2017  Interval progression of the non solid right upper lobe pulmonary nodule measuring 20 mm maximum dimension today compared to 14 mm previously.      Assessment:

## 2017-07-09 NOTE — Patient Instructions (Addendum)
Please see patient coordinator before you leave today  to schedule thoracic surgery eval by Dr Roxan Hockey   Pulmonary follow up is as needed

## 2017-07-09 NOTE — Assessment & Plan Note (Signed)
See  Incidental CT neck 07/22/16  RUL spiculated s/p smoking cessation 07/2016  - Spirometry 09/06/2016  wnl - CT chest  10/02/2016 Stable ground-glass nodule in the RIGHT upper lobe over 2 month interval. Lesion has suspicious morphology for a low-grade adenocarcinoma (atypical adenomatous hyperplasia) but nonspecific. Recommend follow-up CT 01/02/2017 > no change/ pt refused T surg eval> f/u  07/09/2017 now 4mm consider navigational bx vs excisional bx / RULobectomy   I have not done pet due to freq false positives in non-solid tumors of this size and agree with radiology that based on interval growth chances are better than now this is a slow growing adenoca > rec T surgery directly to let Dr Roxan Hockey decide on best approach - I went over all the usual scenarios with her and she wants to put this off unitl next year but I strongly rec she at least hear what T surgery has to say but she could clearly tolerate a RULobectomy if needed   Discussed in detail all the  indications, usual  risks and alternatives  relative to the benefits with patient who agrees to proceed with w/u as outlined    Pulmonary f/u can be prn

## 2017-07-17 ENCOUNTER — Other Ambulatory Visit: Payer: Self-pay | Admitting: *Deleted

## 2017-07-17 ENCOUNTER — Institutional Professional Consult (permissible substitution) (INDEPENDENT_AMBULATORY_CARE_PROVIDER_SITE_OTHER): Payer: Medicare HMO | Admitting: Thoracic Surgery (Cardiothoracic Vascular Surgery)

## 2017-07-17 ENCOUNTER — Encounter: Payer: Self-pay | Admitting: Thoracic Surgery (Cardiothoracic Vascular Surgery)

## 2017-07-17 VITALS — BP 136/90 | HR 70 | Resp 20 | Ht 62.75 in | Wt 185.0 lb

## 2017-07-17 DIAGNOSIS — R911 Solitary pulmonary nodule: Secondary | ICD-10-CM

## 2017-07-17 NOTE — Progress Notes (Signed)
PCP is Golden Circle, FNP Referring Provider is Golden Circle, FNP  Chief Complaint  Patient presents with  . Lung Lesion    Surgical eval, Chest CT 07/09/17    HPI: Kathleen Guerrero is sent for consultation regarding a right upper lobe nodule.  Kathleen Guerrero is a 71 year old woman with a past medical she significant for tobacco abuse (one pack per day 50 years), hypertension, dyslipidemia, and a stroke. She had a stroke affecting her left leg in September 2017. As part of her evaluation she had a CT of the neck. There was a right upper lobe groundglass opacity noted. A CT of the chest showed a 13 x 9 x 8 mm groundglass opacity in follow-up was recommended. She was seen in consultation by Dr. Melvyn Novas. Follow-up CTs and November and March showed no significant change. She recently had another follow-up CT which showed an increase in size to 13 x 20 mm. Dr. Melvyn Novas referred her for consideration for surgical resection.  She has had a nearly complete recovery from her stroke. She is able to ambulate without difficulty. She denies any chest pain, pressure, or tightness at rest or with exertion. She denies shortness of breath at rest or with exertion. She denies cough, hemoptysis, and wheezing. She still works. She lives by herself. She has not had any change in appetite or weight loss. She has not had any headaches or visual changes. Zubrod Score: At the time of surgery this patient's most appropriate activity status/level should be described as: [x]     0    Normal activity, no symptoms []     1    Restricted in physical strenuous activity but ambulatory, able to do out light work []     2    Ambulatory and capable of self care, unable to do work activities, up and about >50 % of waking hours                              []     3    Only limited self care, in bed greater than 50% of waking hours []     4    Completely disabled, no self care, confined to bed or chair []     5    Moribund   Past Medical  History:  Diagnosis Date  . Hypertension   . Stroke ([place])   . Tobacco use     Past Surgical History:  Procedure Laterality Date  . DERMOID CYST  EXCISION    . EP IMPLANTABLE DEVICE N/A 07/23/2016   Procedure: Loop Recorder Insertion;  Surgeon: Thompson Grayer, MD;  Location: [facility];  Service: Cardiovascular;  Laterality: N/A;  . Intestinal obstruction    . TEE WITHOUT CARDIOVERSION N/A 07/23/2016   Procedure: TRANSESOPHAGEAL ECHOCARDIOGRAM (TEE);  Surgeon: Lelon Perla, MD;  Location: [facility];  Service: Cardiovascular;  Laterality: N/A;    Family History  Problem Relation Age of Onset  . Kidney disease Maternal Grandmother   . Cancer Maternal Grandfather     Social History Social History  Substance Use Topics  . Smoking status: Former Smoker    Packs/day: 1.00    Years: 50.00    Types: Cigarettes    Quit date: 07/20/2016  . Smokeless tobacco: Never Used  . Alcohol use Yes     Comment: Rarely    Current Outpatient Prescriptions  Medication Sig Dispense Refill  . amLODipine (NORVASC) 10 MG tablet  TAKE 1 TABLET BY MOUTH EVERY DAY 90 tablet 0  . aspirin 325 MG tablet TAKE 1 TABLET BY MOUTH EVERY DAY 30 tablet 11  . atorvastatin (LIPITOR) 40 MG tablet TAKE 1 TABLET BY MOUTH EVERY DAY AT 6 p.m. 90 tablet 0  . diphenhydrAMINE (BENADRYL) 25 MG tablet Take 25 mg by mouth at bedtime.     . metoprolol (LOPRESSOR) 50 MG tablet Take 1 tablet (50 mg total) by mouth 2 (two) times daily. 60 tablet 11  . nicotine polacrilex ([employer] NICOTINE) 2 MG lozenge Take 2 mg by mouth as needed for smoking cessation.     No current facility-administered medications for this visit.     No Known Allergies  Review of Systems  Constitutional: Negative for activity change, appetite change, fatigue, fever and unexpected weight change.       Rash  HENT: Negative for trouble swallowing and voice change.   Eyes: Negative for visual disturbance.  Respiratory: Negative for cough, shortness  of breath and wheezing.   Cardiovascular: Negative for chest pain, palpitations and leg swelling.  Gastrointestinal: Negative for abdominal pain and blood in stool.  Genitourinary: Negative for difficulty urinating and dysuria.  Musculoskeletal: Negative for arthralgias and joint swelling.  Neurological: Negative for seizures, speech difficulty and headaches. Weakness: Left leg, resolved.  Hematological: Negative for adenopathy. Does not bruise/bleed easily.    BP 136/90   Pulse 70   Resp 20   Ht 5' 2.75" (1.594 m)   Wt 185 lb (83.9 kg)   SpO2 98% Comment: RA  BMI 33.03 kg/m  Physical Exam   Diagnostic Tests: CT CHEST WITHOUT CONTRAST  TECHNIQUE: Multidetector CT imaging of the chest was performed following the standard protocol without IV contrast.  COMPARISON:  01/02/2017  FINDINGS: Cardiovascular: The heart size is normal. No pericardial effusion. Coronary artery calcification is evident. Atherosclerotic calcification is noted in the wall of the thoracic aorta.  Mediastinum/Nodes: No mediastinal lymphadenopathy. No evidence for gross hilar lymphadenopathy although assessment is limited by the lack of intravenous contrast on today's study. The esophagus has normal imaging features. Small hiatal hernia noted. There is no axillary lymphadenopathy.  Lungs/Pleura: Peripheral right upper lobe sub solid nodule has progressed in the interval measuring 13 x 20 mm today compared to 8 x 14 mm previously. No other suspicious pulmonary nodule or mass. No focal airspace consolidation. No pulmonary edema or pleural effusion.  Upper Abdomen: Small stones are seen in the upper pole of each kidney, incompletely visualized.  Musculoskeletal: Bone windows reveal no worrisome lytic or sclerotic osseous lesions.  IMPRESSION: 1. Interval progression of the non solid right upper lobe pulmonary nodule measuring 20 mm maximum dimension today compared to 14 mm previously.  Adenocarcinoma remains a concern. Thoracic surgical consultation suggested. 2. Thoracic aortic atherosclerosis. 3. Bilateral nephrolithiasis.   Electronically Signed   By: Misty Stanley M.D.   On: 07/09/2017 10:13 I personally reviewed the CT images and concur with the findings noted above. There has been a significant increase in comparison to her films from March of this year and prior to that.  Pulmonary function tests November 2017 FVC 3.19 (100%) FEV1 2.36 (97%)  Impression: Mrs. Guerrero is a 71 year old former smoker (50 pack years) who was incidentally found to have a right upper lobe soft solid nodule on a CT of the neck done during the evaluation of her stroke a year ago. This was followed initially but on her recent CT has increased in size significantly from 8 x  14 mm to 13 x 20 mm. In addition on the soft tissue windows are does appear to be more solid component than was present previously. These findings are highly suspicious for a low-grade adenocarcinoma. Infectious or inflammatory nodules are also possible, although in my opinion far less likely. Given the size increase it is imperative that we make a definitive diagnosis and treat appropriately.  We discussed potential treatment options including surgical resection and stereotactic radiation. I informed her of the relative advantages and disadvantages to each approach. She would be at shouldn't pursuing surgical resection. We discussed potential was negative definitive diagnosis including CT-guided biopsy, bronchoscopic biopsy, or VATS with wedge resection. Of those the wedge resection as definitive. In her case if either of the other modalities were positive I would recommend surgery and if they were negative would not rule out the possibility of cancer because of the relatively high incidence of false negative biopsies.  I recommended that we proceed with right VATS for wedge resection and possible right upper lobectomy. She  understands the decision as to whether to proceed with a larger resection would be dependent on the intraoperative frozen section. We discussed the general nature of the operation including the need for general anesthesia, incisions to be used, the use of a drainage tube postoperatively, the expected hospital stay, and the overall recovery. I informed her the indications, risks, benefits, and alternatives. She understands the risk include, but are not limited to death, MI, DVT, PE, stroke, infection, bleeding, possible need for transfusion, prolonged air leak, arrhythmias, as well as possibility of other unforeseeable complications. She understands that while still low risk for stroke is higher because of her history of stroke a year ago.  She accepts the risks and wishes to proceed.  Tobacco abuse- has not smoked in a year. Does still use nicotine lozenges.  Plan:  Right VATS, wedge resection, possible right upper lobectomy on Wednesday, 07/30/2017  [name], MD [facility] [phone]

## 2017-07-18 ENCOUNTER — Ambulatory Visit (INDEPENDENT_AMBULATORY_CARE_PROVIDER_SITE_OTHER): Payer: Medicare HMO | Admitting: *Deleted

## 2017-07-18 DIAGNOSIS — I639 Cerebral infarction, unspecified: Secondary | ICD-10-CM

## 2017-07-21 ENCOUNTER — Encounter: Payer: Medicare HMO | Admitting: Thoracic Surgery (Cardiothoracic Vascular Surgery)

## 2017-07-21 NOTE — Progress Notes (Signed)
[facility] Summary Report / Loop Recorder 

## 2017-07-22 LAB — CUP PACEART REMOTE DEVICE CHECK
Date Time Interrogation Session: 20180915021707
MDC IDC PG IMPLANT DT: 20170919

## 2017-07-28 ENCOUNTER — Ambulatory Visit (HOSPITAL_COMMUNITY)
Admission: RE | Admit: 2017-07-28 | Discharge: 2017-07-28 | Disposition: A | Discharge status: Home / Self Care | Payer: Medicare HMO | Source: Ambulatory Visit | Attending: Thoracic Surgery (Cardiothoracic Vascular Surgery) | Admitting: Thoracic Surgery (Cardiothoracic Vascular Surgery)

## 2017-07-28 ENCOUNTER — Encounter (HOSPITAL_COMMUNITY)
Admission: RE | Admit: 2017-07-28 | Discharge: 2017-07-28 | Disposition: A | Discharge status: Home / Self Care | Payer: Medicare HMO | Source: Ambulatory Visit | Attending: Thoracic Surgery (Cardiothoracic Vascular Surgery) | Admitting: Thoracic Surgery (Cardiothoracic Vascular Surgery)

## 2017-07-28 ENCOUNTER — Encounter (HOSPITAL_COMMUNITY): Payer: Self-pay

## 2017-07-28 DIAGNOSIS — Z01818 Encounter for other preprocedural examination: Secondary | ICD-10-CM

## 2017-07-28 DIAGNOSIS — R911 Solitary pulmonary nodule: Secondary | ICD-10-CM

## 2017-07-28 DIAGNOSIS — R6521 Severe sepsis with septic shock: Secondary | ICD-10-CM | POA: Diagnosis not present

## 2017-07-28 DIAGNOSIS — J9382 Other air leak: Secondary | ICD-10-CM | POA: Diagnosis not present

## 2017-07-28 DIAGNOSIS — Z01810 Encounter for preprocedural cardiovascular examination: Secondary | ICD-10-CM

## 2017-07-28 DIAGNOSIS — J95821 Acute postprocedural respiratory failure: Secondary | ICD-10-CM | POA: Diagnosis not present

## 2017-07-28 DIAGNOSIS — C3411 Malignant neoplasm of upper lobe, right bronchus or lung: Secondary | ICD-10-CM | POA: Diagnosis not present

## 2017-07-28 DIAGNOSIS — E872 Acidosis: Secondary | ICD-10-CM | POA: Diagnosis not present

## 2017-07-28 DIAGNOSIS — R69 Illness, unspecified: Secondary | ICD-10-CM | POA: Diagnosis not present

## 2017-07-28 DIAGNOSIS — N179 Acute kidney failure, unspecified: Secondary | ICD-10-CM | POA: Diagnosis not present

## 2017-07-28 DIAGNOSIS — J690 Pneumonitis due to inhalation of food and vomit: Secondary | ICD-10-CM | POA: Diagnosis not present

## 2017-07-28 DIAGNOSIS — A419 Sepsis, unspecified organism: Secondary | ICD-10-CM | POA: Diagnosis not present

## 2017-07-28 DIAGNOSIS — Z01812 Encounter for preprocedural laboratory examination: Secondary | ICD-10-CM

## 2017-07-28 DIAGNOSIS — E440 Moderate protein-calorie malnutrition: Secondary | ICD-10-CM | POA: Diagnosis not present

## 2017-07-28 DIAGNOSIS — I700 Atherosclerosis of aorta: Secondary | ICD-10-CM | POA: Insufficient documentation

## 2017-07-28 HISTORY — DX: Inflammatory liver disease, unspecified: K75.9

## 2017-07-28 HISTORY — DX: Other specified postprocedural states: Z98.890

## 2017-07-28 HISTORY — DX: Personal history of other diseases of the digestive system: Z87.19

## 2017-07-28 HISTORY — DX: Unspecified osteoarthritis, unspecified site: M19.90

## 2017-07-28 HISTORY — DX: Gastro-esophageal reflux disease without esophagitis: K21.9

## 2017-07-28 HISTORY — DX: Headache: R51

## 2017-07-28 HISTORY — DX: Headache, unspecified: R51.9

## 2017-07-28 LAB — CBC
HEMATOCRIT: 41.3 % (ref 36.0–46.0)
HEMOGLOBIN: 13.6 g/dL (ref 12.0–15.0)
MCH: 29.3 pg (ref 26.0–34.0)
MCHC: 32.9 g/dL (ref 30.0–36.0)
MCV: 89 fL (ref 78.0–100.0)
Platelets: 202 10*3/uL (ref 150–400)
RBC: 4.64 MIL/uL (ref 3.87–5.11)
RDW: 14.6 % (ref 11.5–15.5)
WBC: 6.8 10*3/uL (ref 4.0–10.5)

## 2017-07-28 LAB — BLOOD GAS, ARTERIAL
Acid-Base Excess: 0.8 mmol/L (ref 0.0–2.0)
BICARBONATE: 24.4 mmol/L (ref 20.0–28.0)
DRAWN BY: 470591
FIO2: 21
O2 Saturation: 96.8 %
PATIENT TEMPERATURE: 98.6
PCO2 ART: 35.9 mmHg (ref 32.0–48.0)
PH ART: 7.447 (ref 7.350–7.450)
pO2, Arterial: 86.6 mmHg (ref 83.0–108.0)

## 2017-07-28 LAB — SURGICAL PCR SCREEN
MRSA, PCR: NEGATIVE
Staphylococcus aureus: NEGATIVE

## 2017-07-28 LAB — URINALYSIS, ROUTINE W REFLEX MICROSCOPIC
Bilirubin Urine: NEGATIVE
Glucose, UA: NEGATIVE mg/dL
Hgb urine dipstick: NEGATIVE
KETONES UR: NEGATIVE mg/dL
LEUKOCYTES UA: NEGATIVE
NITRITE: NEGATIVE
PH: 6 (ref 5.0–8.0)
Protein, ur: NEGATIVE mg/dL
SPECIFIC GRAVITY, URINE: 1.014 (ref 1.005–1.030)

## 2017-07-28 LAB — COMPREHENSIVE METABOLIC PANEL
ALBUMIN: 3.8 g/dL (ref 3.5–5.0)
ALK PHOS: 57 U/L (ref 38–126)
ALT: 25 U/L (ref 14–54)
AST: 24 U/L (ref 15–41)
Anion gap: 9 (ref 5–15)
BILIRUBIN TOTAL: 0.9 mg/dL (ref 0.3–1.2)
BUN: 11 mg/dL (ref 6–20)
CALCIUM: 9.6 mg/dL (ref 8.9–10.3)
CO2: 22 mmol/L (ref 22–32)
CREATININE: 0.77 mg/dL (ref 0.44–1.00)
Chloride: 106 mmol/L (ref 101–111)
GFR calc Af Amer: >60 mL/min (ref >60)
GFR calc non Af Amer: >60 mL/min (ref >60)
GLUCOSE: 106 mg/dL — AB (ref 65–99)
Potassium: 3.8 mmol/L (ref 3.5–5.1)
Sodium: 137 mmol/L (ref 135–145)
TOTAL PROTEIN: 7.4 g/dL (ref 6.5–8.1)

## 2017-07-28 LAB — PROTIME-INR
INR: 0.99
Prothrombin Time: 13 seconds (ref 11.4–15.2)

## 2017-07-28 LAB — ABO/RH: ABO/RH(D): A POS

## 2017-07-28 LAB — APTT: APTT: 26 s (ref 24–36)

## 2017-07-28 NOTE — Pre-Procedure Instructions (Addendum)
Kathleen Guerrero  07/28/2017      [facility] [place] - [place], [place] - [address] [place] [address] Phone: [phone] Fax: [phone]    Your procedure is scheduled on 07/30/17  Report to [facility] at 530 A.M.  Call this number if you have problems the morning of surgery:  [phone]   Remember:  Do not eat food or drink liquids after midnight.  Take these medicines the morning of surgery with A SIP OF WATER   Amlodipine(norvasc),metropolol(lopressor)  STOP all herbel meds, nsaids (aleve,naproxen,advil,ibuprofen) prior to surgery starting today 07/28/17 including all vitamins/supplements, fish oil, goodys, BC powders  Stop aspirin per dr   Lazaro Arms not wear jewelry, make-up or nail polish.  Do not wear lotions, powders, or perfumes, or deoderant.  Do not shave 48 hours prior to surgery.  Men may shave face and neck.  Do not bring valuables to the hospital.  [facility] is not responsible for any belongings or valuables.  Contacts, dentures or bridgework may not be worn into surgery.  Leave your suitcase in the car.  After surgery it may be brought to your room.  For patients admitted to the hospital, discharge time will be determined by your treatment team.  Patients discharged the day of surgery will not be allowed to drive home.   Special instructions:   Special Instructions:  - Preparing for Surgery  Before surgery, you can play an important role.  Because skin is not sterile, your skin needs to be as free of germs as possible.  You can reduce the number of germs on you skin by washing with CHG (chlorahexidine gluconate) soap before surgery.  CHG is an antiseptic cleaner which kills germs and bonds with the skin to continue killing germs even after washing.  Please DO NOT use if you have an allergy to CHG or antibacterial soaps.  If your skin becomes reddened/irritated stop using the CHG and inform  your nurse when you arrive at Short Stay.  Do not shave (including legs and underarms) for at least 48 hours prior to the first CHG shower.  You may shave your face.  Please follow these instructions carefully:   1.  Shower with CHG Soap the night before surgery and the morning of Surgery.  2.  If you choose to wash your hair, wash your hair first as usual with your normal shampoo.  3.  After you shampoo, rinse your hair and body thoroughly to remove the Shampoo.  4.  Use CHG as you would any other liquid soap.  You can apply chg directly  to the skin and wash gently with scrungie or a clean washcloth.  5.  Apply the CHG Soap to your body ONLY FROM THE NECK DOWN.  Do not use on open wounds or open sores.  Avoid contact with your eyes ears, mouth and genitals (private parts).  Wash genitals (private parts)       with your normal soap.  6.  Wash thoroughly, paying special attention to the area where your surgery will be performed.  7.  Thoroughly rinse your body with warm water from the neck down.  8.  DO NOT shower/wash with your normal soap after using and rinsing off the CHG Soap.  9.  Pat yourself dry with a clean towel.            10.  Wear clean pajamas.  11.  Place clean sheets on your bed the night of your first shower and do not sleep with pets.  Day of Surgery  Do not apply any lotions/deodorants the morning of surgery.  Please wear clean clothes to the hospital/surgery center.  Please read over the  fact sheets that you were given.

## 2017-07-29 NOTE — Anesthesia Preprocedure Evaluation (Addendum)
Anesthesia Evaluation  Patient identified by MRN, date of birth, ID band Patient awake    Reviewed: Allergy & Precautions, NPO status , Patient's Chart, lab work & pertinent test results, reviewed documented beta blocker date and time   History of Anesthesia Complications Negative for: history of anesthetic complications  Airway Mallampati: IV  TM Distance: <3 FB Neck ROM: Full    Dental  (+) Dental Advisory Given, Poor Dentition   Pulmonary neg pulmonary ROS, former smoker,    breath sounds clear to auscultation       Cardiovascular hypertension, Pt. on medications and Pt. on home beta blockers (-) anginanegative cardio ROS   Rhythm:Regular Rate:Normal  Loop recorder: has not shown any dysrhythmia  Impressions:  - Normal LV size with EF 60-65%. Normal RV size and systolic   function. No significant valvular abnormalities.    Neuro/Psych TIACVA, No Residual Symptoms negative psych ROS   GI/Hepatic Neg liver ROS, hiatal hernia, GERD  Controlled,  Endo/Other  negative endocrine ROS  Renal/GU negative Renal ROS     Musculoskeletal negative musculoskeletal ROS (+)   Abdominal (+) + obese,   Peds  Hematology negative hematology ROS (+)   Anesthesia Other Findings Day of surgery medications reviewed with the patient.  Reproductive/Obstetrics                            Anesthesia Physical Anesthesia Plan  ASA: III  Anesthesia Plan: General   Post-op Pain Management:    Induction: Intravenous  PONV Risk Score and Plan: 3 and Ondansetron, Dexamethasone and Diphenhydramine  Airway Management Planned: Double Lumen EBT  Additional Equipment: Arterial line, CVP and Ultrasound Guidance Line Placement  Intra-op Plan:   Post-operative Plan: Possible Post-op intubation/ventilation  Informed Consent: I have reviewed the patients History and Physical, chart, labs and discussed the  procedure including the risks, benefits and alternatives for the proposed anesthesia with the patient or authorized representative who has indicated his/her understanding and acceptance.   Dental advisory given  Plan Discussed with: CRNA  Anesthesia Plan Comments:        Anesthesia Quick Evaluation

## 2017-07-29 NOTE — Progress Notes (Signed)
Anesthesia Chart Review:  Pt is a 71 year old female scheduled for R VATS, wedge resection, possible lobectomy on 07/30/2017 with Modesto Charon, MD  PMH includes:  HTN, stroke (07/2016), loop recorder (inserted 07/23/16; last device check 07/19/17), hepatitis (30 years ago), GERD. Former smoker. BMI 34  Medications include: amlodipine, ASA 325mg , lipitor, metoprolol  Preoperative labs reviewed.    CXR 07/28/17:  1. No acute findings.  No evidence of pneumonia or pulmonary edema. 2. Right upper lobe pulmonary nodule, as described on recent chest CT. 3. Aortic atherosclerosis.  CT chest 07/09/17:  1. Interval progression of the non solid right upper lobe pulmonary nodule measuring 20 mm maximum dimension today compared to 14 mm previously. Adenocarcinoma remains a concern. Thoracic surgical consultation suggested. 2. Thoracic aortic atherosclerosis. 3. Bilateral nephrolithiasis.  EKG 07/28/17: NSR  Echo 01/17/17:  - Left ventricle: The cavity size was normal. Wall thickness was normal. Systolic function was normal. The estimated ejection fraction was in the range of 60% to 65%. Wall motion was normal; there were no regional wall motion abnormalities. Doppler parameters are consistent with abnormal left ventricular relaxation (grade 1 diastolic dysfunction). - Aortic valve: There was no stenosis. - Mitral valve: There was no significant regurgitation. - Right ventricle: The cavity size was normal. Systolic function was normal. - Tricuspid valve: Peak RV-RA gradient (S): 24 mm Hg. - Pulmonary arteries: PA peak pressure: 27 mm Hg (S). - Inferior vena cava: The vessel was normal in size. The respirophasic diameter changes were in the normal range (>= 50%), consistent with normal central venous pressure. - Impressions: Normal LV size with EF 60-65%. Normal RV size and systolic function. No significant valvular abnormalities.  Carotid duplex 01/17/17:  - Right - 1% to 39% ICA stenosis. Vertebral  artery flow is antegrade. - Left - No evidence of ICA stenosis however tortuous. Vertebral artery flow is antegrade.  If no changes, I anticipate pt can proceed with surgery as scheduled.   Willeen Cass, FNP-BC [facility]/Anesthesiology Phone: [phone] 07/29/2017 10:30 AM

## 2017-07-30 ENCOUNTER — Inpatient Hospital Stay (HOSPITAL_COMMUNITY): Payer: Medicare HMO | Admitting: Emergency Medicine

## 2017-07-30 ENCOUNTER — Inpatient Hospital Stay (HOSPITAL_COMMUNITY): Payer: Medicare HMO | Admitting: Anesthesiology

## 2017-07-30 ENCOUNTER — Inpatient Hospital Stay (HOSPITAL_COMMUNITY): Payer: Medicare HMO

## 2017-07-30 ENCOUNTER — Inpatient Hospital Stay (HOSPITAL_COMMUNITY)
Admission: RE | Admit: 2017-07-30 | Discharge: 2017-08-20 | DRG: 163 | Disposition: A | Discharge status: Rehabilitation Facility | Payer: Medicare HMO | Source: Ambulatory Visit | Attending: Thoracic Surgery (Cardiothoracic Vascular Surgery) | Admitting: Thoracic Surgery (Cardiothoracic Vascular Surgery)

## 2017-07-30 ENCOUNTER — Encounter (HOSPITAL_COMMUNITY): Payer: Self-pay | Admitting: *Deleted

## 2017-07-30 ENCOUNTER — Encounter (HOSPITAL_COMMUNITY)
Admission: RE | Disposition: A | Discharge status: Rehabilitation Facility | Payer: Self-pay | Source: Ambulatory Visit | Attending: Thoracic Surgery (Cardiothoracic Vascular Surgery)

## 2017-07-30 DIAGNOSIS — F05 Delirium due to known physiological condition: Secondary | ICD-10-CM | POA: Diagnosis not present

## 2017-07-30 DIAGNOSIS — Z9689 Presence of other specified functional implants: Secondary | ICD-10-CM

## 2017-07-30 DIAGNOSIS — R0603 Acute respiratory distress: Secondary | ICD-10-CM | POA: Diagnosis not present

## 2017-07-30 DIAGNOSIS — J939 Pneumothorax, unspecified: Secondary | ICD-10-CM

## 2017-07-30 DIAGNOSIS — I10 Essential (primary) hypertension: Secondary | ICD-10-CM | POA: Diagnosis present

## 2017-07-30 DIAGNOSIS — Z978 Presence of other specified devices: Secondary | ICD-10-CM

## 2017-07-30 DIAGNOSIS — R739 Hyperglycemia, unspecified: Secondary | ICD-10-CM | POA: Diagnosis not present

## 2017-07-30 DIAGNOSIS — F329 Major depressive disorder, single episode, unspecified: Secondary | ICD-10-CM

## 2017-07-30 DIAGNOSIS — Z781 Physical restraint status: Secondary | ICD-10-CM

## 2017-07-30 DIAGNOSIS — J9690 Respiratory failure, unspecified, unspecified whether with hypoxia or hypercapnia: Secondary | ICD-10-CM

## 2017-07-30 DIAGNOSIS — E440 Moderate protein-calorie malnutrition: Secondary | ICD-10-CM | POA: Diagnosis not present

## 2017-07-30 DIAGNOSIS — N390 Urinary tract infection, site not specified: Secondary | ICD-10-CM | POA: Diagnosis not present

## 2017-07-30 DIAGNOSIS — Z902 Acquired absence of lung [part of]: Secondary | ICD-10-CM

## 2017-07-30 DIAGNOSIS — Z452 Encounter for adjustment and management of vascular access device: Secondary | ICD-10-CM | POA: Diagnosis not present

## 2017-07-30 DIAGNOSIS — N179 Acute kidney failure, unspecified: Secondary | ICD-10-CM | POA: Diagnosis not present

## 2017-07-30 DIAGNOSIS — Z4682 Encounter for fitting and adjustment of non-vascular catheter: Secondary | ICD-10-CM | POA: Diagnosis not present

## 2017-07-30 DIAGNOSIS — Z720 Tobacco use: Secondary | ICD-10-CM | POA: Diagnosis not present

## 2017-07-30 DIAGNOSIS — E871 Hypo-osmolality and hyponatremia: Secondary | ICD-10-CM | POA: Diagnosis not present

## 2017-07-30 DIAGNOSIS — E876 Hypokalemia: Secondary | ICD-10-CM | POA: Diagnosis not present

## 2017-07-30 DIAGNOSIS — R451 Restlessness and agitation: Secondary | ICD-10-CM

## 2017-07-30 DIAGNOSIS — R6521 Severe sepsis with septic shock: Secondary | ICD-10-CM | POA: Diagnosis not present

## 2017-07-30 DIAGNOSIS — R079 Chest pain, unspecified: Secondary | ICD-10-CM | POA: Diagnosis not present

## 2017-07-30 DIAGNOSIS — R4182 Altered mental status, unspecified: Secondary | ICD-10-CM | POA: Diagnosis not present

## 2017-07-30 DIAGNOSIS — E46 Unspecified protein-calorie malnutrition: Secondary | ICD-10-CM | POA: Diagnosis not present

## 2017-07-30 DIAGNOSIS — C3411 Malignant neoplasm of upper lobe, right bronchus or lung: Principal | ICD-10-CM | POA: Diagnosis present

## 2017-07-30 DIAGNOSIS — R69 Illness, unspecified: Secondary | ICD-10-CM | POA: Diagnosis not present

## 2017-07-30 DIAGNOSIS — J860 Pyothorax with fistula: Secondary | ICD-10-CM | POA: Diagnosis not present

## 2017-07-30 DIAGNOSIS — Z8673 Personal history of transient ischemic attack (TIA), and cerebral infarction without residual deficits: Secondary | ICD-10-CM

## 2017-07-30 DIAGNOSIS — Z7982 Long term (current) use of aspirin: Secondary | ICD-10-CM | POA: Diagnosis not present

## 2017-07-30 DIAGNOSIS — E870 Hyperosmolality and hypernatremia: Secondary | ICD-10-CM | POA: Diagnosis not present

## 2017-07-30 DIAGNOSIS — Z01810 Encounter for preprocedural cardiovascular examination: Secondary | ICD-10-CM

## 2017-07-30 DIAGNOSIS — R1310 Dysphagia, unspecified: Secondary | ICD-10-CM | POA: Diagnosis not present

## 2017-07-30 DIAGNOSIS — T85598A Other mechanical complication of other gastrointestinal prosthetic devices, implants and grafts, initial encounter: Secondary | ICD-10-CM

## 2017-07-30 DIAGNOSIS — T380X5A Adverse effect of glucocorticoids and synthetic analogues, initial encounter: Secondary | ICD-10-CM | POA: Diagnosis not present

## 2017-07-30 DIAGNOSIS — Z87891 Personal history of nicotine dependence: Secondary | ICD-10-CM

## 2017-07-30 DIAGNOSIS — A419 Sepsis, unspecified organism: Secondary | ICD-10-CM | POA: Diagnosis not present

## 2017-07-30 DIAGNOSIS — R490 Dysphonia: Secondary | ICD-10-CM | POA: Diagnosis not present

## 2017-07-30 DIAGNOSIS — J9382 Other air leak: Secondary | ICD-10-CM | POA: Diagnosis not present

## 2017-07-30 DIAGNOSIS — Z4659 Encounter for fitting and adjustment of other gastrointestinal appliance and device: Secondary | ICD-10-CM

## 2017-07-30 DIAGNOSIS — J690 Pneumonitis due to inhalation of food and vomit: Secondary | ICD-10-CM | POA: Diagnosis not present

## 2017-07-30 DIAGNOSIS — E785 Hyperlipidemia, unspecified: Secondary | ICD-10-CM | POA: Diagnosis not present

## 2017-07-30 DIAGNOSIS — I700 Atherosclerosis of aorta: Secondary | ICD-10-CM | POA: Diagnosis present

## 2017-07-30 DIAGNOSIS — Z01818 Encounter for other preprocedural examination: Secondary | ICD-10-CM

## 2017-07-30 DIAGNOSIS — G4733 Obstructive sleep apnea (adult) (pediatric): Secondary | ICD-10-CM | POA: Diagnosis present

## 2017-07-30 DIAGNOSIS — R0989 Other specified symptoms and signs involving the circulatory and respiratory systems: Secondary | ICD-10-CM | POA: Diagnosis not present

## 2017-07-30 DIAGNOSIS — Z79899 Other long term (current) drug therapy: Secondary | ICD-10-CM | POA: Diagnosis not present

## 2017-07-30 DIAGNOSIS — D72829 Elevated white blood cell count, unspecified: Secondary | ICD-10-CM

## 2017-07-30 DIAGNOSIS — Z01812 Encounter for preprocedural laboratory examination: Secondary | ICD-10-CM

## 2017-07-30 DIAGNOSIS — D62 Acute posthemorrhagic anemia: Secondary | ICD-10-CM

## 2017-07-30 DIAGNOSIS — R5381 Other malaise: Secondary | ICD-10-CM

## 2017-07-30 DIAGNOSIS — Z8709 Personal history of other diseases of the respiratory system: Secondary | ICD-10-CM

## 2017-07-30 DIAGNOSIS — F419 Anxiety disorder, unspecified: Secondary | ICD-10-CM

## 2017-07-30 DIAGNOSIS — J189 Pneumonia, unspecified organism: Secondary | ICD-10-CM | POA: Diagnosis not present

## 2017-07-30 DIAGNOSIS — J810 Acute pulmonary edema: Secondary | ICD-10-CM | POA: Diagnosis not present

## 2017-07-30 DIAGNOSIS — F3341 Major depressive disorder, recurrent, in partial remission: Secondary | ICD-10-CM | POA: Diagnosis not present

## 2017-07-30 DIAGNOSIS — G7281 Critical illness myopathy: Secondary | ICD-10-CM | POA: Diagnosis not present

## 2017-07-30 DIAGNOSIS — J029 Acute pharyngitis, unspecified: Secondary | ICD-10-CM | POA: Diagnosis not present

## 2017-07-30 DIAGNOSIS — R0602 Shortness of breath: Secondary | ICD-10-CM | POA: Diagnosis not present

## 2017-07-30 DIAGNOSIS — Z9289 Personal history of other medical treatment: Secondary | ICD-10-CM

## 2017-07-30 DIAGNOSIS — R402434 Glasgow coma scale score 3-8, 24 hours or more after hospital admission: Secondary | ICD-10-CM | POA: Diagnosis not present

## 2017-07-30 DIAGNOSIS — J9601 Acute respiratory failure with hypoxia: Secondary | ICD-10-CM | POA: Diagnosis not present

## 2017-07-30 DIAGNOSIS — R0902 Hypoxemia: Secondary | ICD-10-CM | POA: Diagnosis not present

## 2017-07-30 DIAGNOSIS — E872 Acidosis: Secondary | ICD-10-CM | POA: Diagnosis not present

## 2017-07-30 DIAGNOSIS — I480 Paroxysmal atrial fibrillation: Secondary | ICD-10-CM

## 2017-07-30 DIAGNOSIS — J90 Pleural effusion, not elsewhere classified: Secondary | ICD-10-CM | POA: Diagnosis not present

## 2017-07-30 DIAGNOSIS — I639 Cerebral infarction, unspecified: Secondary | ICD-10-CM | POA: Diagnosis not present

## 2017-07-30 DIAGNOSIS — J9811 Atelectasis: Secondary | ICD-10-CM | POA: Diagnosis not present

## 2017-07-30 DIAGNOSIS — E8770 Fluid overload, unspecified: Secondary | ICD-10-CM | POA: Diagnosis not present

## 2017-07-30 DIAGNOSIS — J95821 Acute postprocedural respiratory failure: Secondary | ICD-10-CM | POA: Diagnosis not present

## 2017-07-30 DIAGNOSIS — R350 Frequency of micturition: Secondary | ICD-10-CM | POA: Diagnosis not present

## 2017-07-30 DIAGNOSIS — R140 Abdominal distension (gaseous): Secondary | ICD-10-CM | POA: Diagnosis not present

## 2017-07-30 DIAGNOSIS — C801 Malignant (primary) neoplasm, unspecified: Secondary | ICD-10-CM | POA: Diagnosis not present

## 2017-07-30 DIAGNOSIS — G9340 Encephalopathy, unspecified: Secondary | ICD-10-CM | POA: Diagnosis not present

## 2017-07-30 DIAGNOSIS — G459 Transient cerebral ischemic attack, unspecified: Secondary | ICD-10-CM | POA: Diagnosis not present

## 2017-07-30 DIAGNOSIS — I4891 Unspecified atrial fibrillation: Secondary | ICD-10-CM | POA: Diagnosis not present

## 2017-07-30 DIAGNOSIS — R911 Solitary pulmonary nodule: Secondary | ICD-10-CM | POA: Diagnosis not present

## 2017-07-30 DIAGNOSIS — R197 Diarrhea, unspecified: Secondary | ICD-10-CM | POA: Diagnosis not present

## 2017-07-30 DIAGNOSIS — K219 Gastro-esophageal reflux disease without esophagitis: Secondary | ICD-10-CM | POA: Diagnosis present

## 2017-07-30 DIAGNOSIS — F411 Generalized anxiety disorder: Secondary | ICD-10-CM | POA: Diagnosis not present

## 2017-07-30 DIAGNOSIS — J439 Emphysema, unspecified: Secondary | ICD-10-CM | POA: Diagnosis not present

## 2017-07-30 DIAGNOSIS — F32A Depression, unspecified: Secondary | ICD-10-CM

## 2017-07-30 DIAGNOSIS — R159 Full incontinence of feces: Secondary | ICD-10-CM | POA: Diagnosis not present

## 2017-07-30 HISTORY — PX: VIDEO ASSISTED THORACOSCOPY (VATS)/WEDGE RESECTION: SHX6174

## 2017-07-30 HISTORY — PX: LOBECTOMY: SHX5089

## 2017-07-30 LAB — CBC
HEMATOCRIT: 29.3 % — AB (ref 36.0–46.0)
Hemoglobin: 9.5 g/dL — ABNORMAL LOW (ref 12.0–15.0)
MCH: 29.1 pg (ref 26.0–34.0)
MCHC: 32.4 g/dL (ref 30.0–36.0)
MCV: 89.6 fL (ref 78.0–100.0)
PLATELETS: 181 10*3/uL (ref 150–400)
RBC: 3.27 MIL/uL — ABNORMAL LOW (ref 3.87–5.11)
RDW: 14.7 % (ref 11.5–15.5)
WBC: 20.9 10*3/uL — AB (ref 4.0–10.5)

## 2017-07-30 LAB — POCT I-STAT 7, (LYTES, BLD GAS, ICA,H+H)
ACID-BASE DEFICIT: 2 mmol/L (ref 0.0–2.0)
ACID-BASE DEFICIT: 4 mmol/L — AB (ref 0.0–2.0)
Acid-base deficit: 4 mmol/L — ABNORMAL HIGH (ref 0.0–2.0)
Acid-base deficit: 2 mmol/L (ref 0.0–2.0)
BICARBONATE: 22.8 mmol/L (ref 20.0–28.0)
Bicarbonate: 23.1 mmol/L (ref 20.0–28.0)
Bicarbonate: 24 mmol/L (ref 20.0–28.0)
Bicarbonate: 24.4 mmol/L (ref 20.0–28.0)
CALCIUM ION: 1.15 mmol/L (ref 1.15–1.40)
Calcium, Ion: 1.13 mmol/L — ABNORMAL LOW (ref 1.15–1.40)
Calcium, Ion: 1.17 mmol/L (ref 1.15–1.40)
Calcium, Ion: 1.11 mmol/L — ABNORMAL LOW (ref 1.15–1.40)
HCT: 30 % — ABNORMAL LOW (ref 36.0–46.0)
HCT: 30 % — ABNORMAL LOW (ref 36.0–46.0)
HCT: 30 % — ABNORMAL LOW (ref 36.0–46.0)
HEMATOCRIT: 28 % — AB (ref 36.0–46.0)
HEMOGLOBIN: 10.2 g/dL — AB (ref 12.0–15.0)
Hemoglobin: 10.2 g/dL — ABNORMAL LOW (ref 12.0–15.0)
Hemoglobin: 10.2 g/dL — ABNORMAL LOW (ref 12.0–15.0)
Hemoglobin: 9.5 g/dL — ABNORMAL LOW (ref 12.0–15.0)
O2 SAT: 94 %
O2 SAT: 98 %
O2 SAT: 99 %
O2 Saturation: 99 %
PCO2 ART: 45.4 mmHg (ref 32.0–48.0)
PCO2 ART: 47.9 mmHg (ref 32.0–48.0)
PCO2 ART: 48.7 mmHg — AB (ref 32.0–48.0)
PH ART: 7.273 — AB (ref 7.350–7.450)
PO2 ART: 104 mmHg (ref 83.0–108.0)
PO2 ART: 123 mmHg — AB (ref 83.0–108.0)
PO2 ART: 164 mmHg — AB (ref 83.0–108.0)
POTASSIUM: 3.6 mmol/L (ref 3.5–5.1)
POTASSIUM: 4.1 mmol/L (ref 3.5–5.1)
Patient temperature: 35.4
Patient temperature: 35.6
Patient temperature: 35.6
Potassium: 4.1 mmol/L (ref 3.5–5.1)
Potassium: 4.4 mmol/L (ref 3.5–5.1)
SODIUM: 142 mmol/L (ref 135–145)
Sodium: 140 mmol/L (ref 135–145)
Sodium: 140 mmol/L (ref 135–145)
Sodium: 140 mmol/L (ref 135–145)
TCO2: 25 mmol/L (ref 22–32)
TCO2: 25 mmol/L (ref 22–32)
TCO2: 26 mmol/L (ref 22–32)
TCO2: 24 mmol/L (ref 22–32)
pCO2 arterial: 47.4 mmHg (ref 32.0–48.0)
pH, Arterial: 7.288 — ABNORMAL LOW (ref 7.350–7.450)
pH, Arterial: 7.308 — ABNORMAL LOW (ref 7.350–7.450)
pH, Arterial: 7.324 — ABNORMAL LOW (ref 7.350–7.450)
pO2, Arterial: 73 mmHg — ABNORMAL LOW (ref 83.0–108.0)

## 2017-07-30 LAB — PREPARE RBC (CROSSMATCH)

## 2017-07-30 LAB — POCT I-STAT 3, ART BLOOD GAS (G3+)
Acid-base deficit: 9 mmol/L — ABNORMAL HIGH (ref 0.0–2.0)
Bicarbonate: 16.5 mmol/L — ABNORMAL LOW (ref 20.0–28.0)
O2 SAT: 97 %
TCO2: 17 mmol/L — AB (ref 22–32)
pCO2 arterial: 31.5 mmHg — ABNORMAL LOW (ref 32.0–48.0)
pH, Arterial: 7.326 — ABNORMAL LOW (ref 7.350–7.450)
pO2, Arterial: 99 mmHg (ref 83.0–108.0)

## 2017-07-30 SURGERY — VIDEO ASSISTED THORACOSCOPY (VATS)/WEDGE RESECTION
Anesthesia: General | Site: Chest | Laterality: Right

## 2017-07-30 MED ORDER — SUCCINYLCHOLINE CHLORIDE 20 MG/ML IJ SOLN
INTRAMUSCULAR | Status: DC | PRN
  Administered 2017-07-30: 140 mg via INTRAVENOUS

## 2017-07-30 MED ORDER — SODIUM CHLORIDE 0.9% FLUSH
10.0000 mL | INTRAVENOUS | Status: DC | PRN
Start: 2017-07-30 — End: 2017-08-20
  Administered 2017-08-17: 10 mL
  Filled 2017-07-30: qty 40

## 2017-07-30 MED ORDER — LEVALBUTEROL HCL 0.63 MG/3ML IN NEBU
0.6300 mg | INHALATION_SOLUTION | Freq: Four times a day (QID) | RESPIRATORY_TRACT | Status: DC
  Administered 2017-07-31: 0.63 mg via RESPIRATORY_TRACT
  Filled 2017-07-30 (×2): qty 3

## 2017-07-30 MED ORDER — HALOPERIDOL LACTATE 5 MG/ML IJ SOLN: 5.0000 mg | Freq: Once | INTRAMUSCULAR | Status: DC

## 2017-07-30 MED ORDER — SODIUM CHLORIDE 0.9 % IV SOLN
0.4000 ug/kg/h | INTRAVENOUS | Status: DC
  Administered 2017-07-30: 0.4 ug/kg/h via INTRAVENOUS
  Administered 2017-07-30: 1 ug/kg/h via INTRAVENOUS
  Filled 2017-07-30 (×6): qty 2

## 2017-07-30 MED ORDER — HALOPERIDOL LACTATE 5 MG/ML IJ SOLN
INTRAMUSCULAR | Status: AC
  Filled 2017-07-30: qty 1

## 2017-07-30 MED ORDER — DEXTROSE 5 % IV SOLN
1.5000 g | Freq: Two times a day (BID) | INTRAVENOUS | Status: AC
  Administered 2017-07-30 – 2017-07-31 (×2): 1.5 g via INTRAVENOUS
  Filled 2017-07-30 (×2): qty 1.5

## 2017-07-30 MED ORDER — ACETAMINOPHEN 160 MG/5ML PO SOLN
1000.0000 mg | Freq: Four times a day (QID) | ORAL | Status: AC
  Administered 2017-08-02 – 2017-08-04 (×9): 1000 mg via ORAL
  Filled 2017-07-30 (×10): qty 40.6

## 2017-07-30 MED ORDER — DEXTROSE 5 % IV SOLN
1.5000 g | INTRAVENOUS | Status: AC
  Administered 2017-07-30: 1.5 g via INTRAVENOUS
  Filled 2017-07-30: qty 1.5

## 2017-07-30 MED ORDER — ONDANSETRON HCL 4 MG/2ML IJ SOLN
INTRAMUSCULAR | Status: DC | PRN
  Administered 2017-07-30: 4 mg via INTRAVENOUS

## 2017-07-30 MED ORDER — LIDOCAINE 2% (20 MG/ML) 5 ML SYRINGE
INTRAMUSCULAR | Status: AC
  Filled 2017-07-30: qty 5

## 2017-07-30 MED ORDER — HEMOSTATIC AGENTS (NO CHARGE) OPTIME
TOPICAL | Status: DC | PRN
  Administered 2017-07-30: 1 via TOPICAL

## 2017-07-30 MED ORDER — METOCLOPRAMIDE HCL 5 MG/ML IJ SOLN
10.0000 mg | Freq: Four times a day (QID) | INTRAMUSCULAR | Status: AC
  Administered 2017-07-30 – 2017-07-31 (×4): 10 mg via INTRAVENOUS
  Filled 2017-07-30 (×4): qty 2

## 2017-07-30 MED ORDER — CALCIUM CARBONATE ANTACID 500 MG PO CHEW: 1.0000 | CHEWABLE_TABLET | Freq: Four times a day (QID) | ORAL | Status: DC | PRN

## 2017-07-30 MED ORDER — PROPOFOL 10 MG/ML IV BOLUS
INTRAVENOUS | Status: AC
  Filled 2017-07-30: qty 20

## 2017-07-30 MED ORDER — ROCURONIUM BROMIDE 10 MG/ML (PF) SYRINGE
PREFILLED_SYRINGE | INTRAVENOUS | Status: AC
  Filled 2017-07-30: qty 5

## 2017-07-30 MED ORDER — MIDAZOLAM HCL 2 MG/2ML IJ SOLN
INTRAMUSCULAR | Status: AC
  Filled 2017-07-30: qty 2

## 2017-07-30 MED ORDER — HYDROMORPHONE 1 MG/ML IV SOLN
INTRAVENOUS | Status: DC
  Administered 2017-07-30: 0.2 mg via INTRAVENOUS
  Administered 2017-07-30: 0.4 mg via INTRAVENOUS
  Administered 2017-07-30: 14:00:00 via INTRAVENOUS
  Administered 2017-07-31: 0 mg via INTRAVENOUS
  Administered 2017-07-31: 0.2 mg via INTRAVENOUS

## 2017-07-30 MED ORDER — SUGAMMADEX SODIUM 200 MG/2ML IV SOLN
INTRAVENOUS | Status: AC
  Filled 2017-07-30: qty 4

## 2017-07-30 MED ORDER — DEXAMETHASONE SODIUM PHOSPHATE 10 MG/ML IJ SOLN
INTRAMUSCULAR | Status: AC
  Filled 2017-07-30: qty 1

## 2017-07-30 MED ORDER — DIPHENHYDRAMINE HCL 25 MG PO CAPS: 50.0000 mg | ORAL_CAPSULE | Freq: Every day | ORAL | Status: DC

## 2017-07-30 MED ORDER — HALOPERIDOL LACTATE 5 MG/ML IJ SOLN
1.0000 mg | Freq: Once | INTRAMUSCULAR | Status: AC
  Administered 2017-07-30: 1 mg via INTRAVENOUS

## 2017-07-30 MED ORDER — BUPIVACAINE 0.5 % ON-Q PUMP SINGLE CATH 400 ML
400.0000 mL | INJECTION | Status: DC
  Filled 2017-07-30: qty 400

## 2017-07-30 MED ORDER — SODIUM CHLORIDE 0.9% FLUSH: 9.0000 mL | INTRAVENOUS | Status: DC | PRN

## 2017-07-30 MED ORDER — ONDANSETRON HCL 4 MG/2ML IJ SOLN
INTRAMUSCULAR | Status: AC
  Filled 2017-07-30: qty 2

## 2017-07-30 MED ORDER — SODIUM BICARBONATE 8.4 % IV SOLN
50.0000 meq | Freq: Once | INTRAVENOUS | Status: AC
  Administered 2017-07-30: 50 meq via INTRAVENOUS

## 2017-07-30 MED ORDER — METOPROLOL TARTRATE 50 MG PO TABS: 50.0000 mg | ORAL_TABLET | Freq: Two times a day (BID) | ORAL | Status: DC

## 2017-07-30 MED ORDER — TRAMADOL HCL 50 MG PO TABS: 50.0000 mg | ORAL_TABLET | Freq: Four times a day (QID) | ORAL | Status: DC | PRN

## 2017-07-30 MED ORDER — SENNOSIDES-DOCUSATE SODIUM 8.6-50 MG PO TABS
1.0000 | ORAL_TABLET | Freq: Every day | ORAL | Status: DC
  Administered 2017-08-04 – 2017-08-07 (×4): 1 via ORAL
  Filled 2017-07-30 (×8): qty 1

## 2017-07-30 MED ORDER — DIPHENHYDRAMINE HCL 50 MG/ML IJ SOLN
INTRAMUSCULAR | Status: AC
  Filled 2017-07-30: qty 1

## 2017-07-30 MED ORDER — MIDAZOLAM HCL 5 MG/5ML IJ SOLN
INTRAMUSCULAR | Status: DC | PRN
  Administered 2017-07-30: 2 mg via INTRAVENOUS

## 2017-07-30 MED ORDER — FENTANYL CITRATE (PF) 100 MCG/2ML IJ SOLN
INTRAMUSCULAR | Status: DC | PRN
  Administered 2017-07-30 (×3): 50 ug via INTRAVENOUS
  Administered 2017-07-30: 150 ug via INTRAVENOUS

## 2017-07-30 MED ORDER — DEXMEDETOMIDINE HCL IN NACL 400 MCG/100ML IV SOLN
0.4000 ug/kg/h | INTRAVENOUS | Status: DC
  Administered 2017-07-30 – 2017-07-31 (×8): 1.2 ug/kg/h via INTRAVENOUS
  Administered 2017-08-01: 0.8 ug/kg/h via INTRAVENOUS
  Administered 2017-08-01 (×2): 1.1 ug/kg/h via INTRAVENOUS
  Administered 2017-08-01: 0.8 ug/kg/h via INTRAVENOUS
  Filled 2017-07-30 (×13): qty 100

## 2017-07-30 MED ORDER — SUCCINYLCHOLINE CHLORIDE 200 MG/10ML IV SOSY
PREFILLED_SYRINGE | INTRAVENOUS | Status: AC
  Filled 2017-07-30: qty 10

## 2017-07-30 MED ORDER — 0.9 % SODIUM CHLORIDE (POUR BTL) OPTIME
TOPICAL | Status: DC | PRN
  Administered 2017-07-30: 2000 mL

## 2017-07-30 MED ORDER — SODIUM CHLORIDE 0.9 % IV SOLN
INTRAVENOUS | Status: DC | PRN
  Administered 2017-07-30: 11:00:00 via INTRAVENOUS

## 2017-07-30 MED ORDER — LORAZEPAM 2 MG/ML IJ SOLN
0.5000 mg | Freq: Four times a day (QID) | INTRAMUSCULAR | Status: DC | PRN
  Administered 2017-07-30 – 2017-07-31 (×5): 0.5 mg via INTRAVENOUS
  Filled 2017-07-30 (×5): qty 1

## 2017-07-30 MED ORDER — NALOXONE HCL 0.4 MG/ML IJ SOLN: 0.4000 mg | INTRAMUSCULAR | Status: DC | PRN

## 2017-07-30 MED ORDER — BUPIVACAINE ON-Q PAIN PUMP (FOR ORDER SET NO CHG)
INJECTION | Status: DC
  Filled 2017-07-30: qty 1

## 2017-07-30 MED ORDER — PHENYLEPHRINE HCL 10 MG/ML IJ SOLN
0.0000 ug/min | INTRAMUSCULAR | Status: DC
  Administered 2017-07-30 (×2): 60 ug/min via INTRAVENOUS
  Administered 2017-07-30: 50 ug/min via INTRAVENOUS
  Filled 2017-07-30 (×3): qty 1

## 2017-07-30 MED ORDER — OXYCODONE HCL 5 MG PO TABS: 5.0000 mg | ORAL_TABLET | ORAL | Status: DC | PRN

## 2017-07-30 MED ORDER — SUGAMMADEX SODIUM 200 MG/2ML IV SOLN
INTRAVENOUS | Status: DC | PRN
  Administered 2017-07-30: 300 mg via INTRAVENOUS

## 2017-07-30 MED ORDER — ATORVASTATIN CALCIUM 40 MG PO TABS
40.0000 mg | ORAL_TABLET | Freq: Every day | ORAL | Status: DC
  Administered 2017-08-02 – 2017-08-20 (×19): 40 mg via ORAL
  Filled 2017-07-30 (×19): qty 1

## 2017-07-30 MED ORDER — CHLORHEXIDINE GLUCONATE CLOTH 2 % EX PADS
6.0000 | MEDICATED_PAD | Freq: Every day | CUTANEOUS | Status: DC
  Administered 2017-07-30 – 2017-08-15 (×16): 6 via TOPICAL

## 2017-07-30 MED ORDER — ASPIRIN 325 MG PO TABS
325.0000 mg | ORAL_TABLET | Freq: Every day | ORAL | Status: DC
  Administered 2017-08-02: 325 mg via ORAL
  Filled 2017-07-30: qty 1

## 2017-07-30 MED ORDER — ACETAMINOPHEN 500 MG PO TABS
1000.0000 mg | ORAL_TABLET | Freq: Four times a day (QID) | ORAL | Status: AC
  Administered 2017-07-30: 1000 mg via ORAL
  Filled 2017-07-30: qty 2

## 2017-07-30 MED ORDER — PHENYLEPHRINE HCL 10 MG/ML IJ SOLN
INTRAVENOUS | Status: DC | PRN
  Administered 2017-07-30: 50 ug/min via INTRAVENOUS

## 2017-07-30 MED ORDER — ALBUMIN HUMAN 5 % IV SOLN
INTRAVENOUS | Status: DC | PRN
  Administered 2017-07-30 (×2): via INTRAVENOUS

## 2017-07-30 MED ORDER — HYDROMORPHONE HCL 1 MG/ML IJ SOLN: 0.2500 mg | INTRAMUSCULAR | Status: DC | PRN

## 2017-07-30 MED ORDER — ALBUMIN HUMAN 5 % IV SOLN
INTRAVENOUS | Status: AC
  Administered 2017-07-30: 12.5 g
  Filled 2017-07-30: qty 250

## 2017-07-30 MED ORDER — PROPOFOL 10 MG/ML IV BOLUS
INTRAVENOUS | Status: DC | PRN
  Administered 2017-07-30: 150 mg via INTRAVENOUS

## 2017-07-30 MED ORDER — PHENYLEPHRINE HCL 10 MG/ML IJ SOLN
INTRAMUSCULAR | Status: AC
  Filled 2017-07-30: qty 3

## 2017-07-30 MED ORDER — DEXAMETHASONE SODIUM PHOSPHATE 10 MG/ML IJ SOLN
INTRAMUSCULAR | Status: DC | PRN
  Administered 2017-07-30: 10 mg via INTRAVENOUS

## 2017-07-30 MED ORDER — ORAL CARE MOUTH RINSE
15.0000 mL | Freq: Two times a day (BID) | OROMUCOSAL | Status: DC
  Administered 2017-07-30 – 2017-08-03 (×6): 15 mL via OROMUCOSAL

## 2017-07-30 MED ORDER — DIPHENHYDRAMINE HCL 50 MG/ML IJ SOLN: 12.5000 mg | Freq: Four times a day (QID) | INTRAMUSCULAR | Status: DC | PRN

## 2017-07-30 MED ORDER — FENTANYL CITRATE (PF) 250 MCG/5ML IJ SOLN
INTRAMUSCULAR | Status: AC
  Filled 2017-07-30: qty 5

## 2017-07-30 MED ORDER — LACTATED RINGERS IV SOLN
INTRAVENOUS | Status: DC | PRN
  Administered 2017-07-30 (×2): via INTRAVENOUS

## 2017-07-30 MED ORDER — CEFUROXIME SODIUM 1.5 G IV SOLR
1.5000 g | INTRAVENOUS | Status: AC
  Administered 2017-07-30: 1.5 g via INTRAVENOUS
  Filled 2017-07-30: qty 1.5

## 2017-07-30 MED ORDER — ALBUMIN HUMAN 5 % IV SOLN: 12.5000 g | Freq: Once | INTRAVENOUS | Status: DC

## 2017-07-30 MED ORDER — HYDROMORPHONE 1 MG/ML IV SOLN
INTRAVENOUS | Status: AC
  Filled 2017-07-30: qty 25

## 2017-07-30 MED ORDER — PHENYLEPHRINE HCL 10 MG/ML IJ SOLN
0.0000 ug/min | INTRAMUSCULAR | Status: DC
  Administered 2017-07-30: 60 ug/min via INTRAVENOUS
  Filled 2017-07-30: qty 4

## 2017-07-30 MED ORDER — ONDANSETRON HCL 4 MG/2ML IJ SOLN: 4.0000 mg | Freq: Four times a day (QID) | INTRAMUSCULAR | Status: DC | PRN

## 2017-07-30 MED ORDER — PHENYLEPHRINE HCL 10 MG/ML IJ SOLN
INTRAMUSCULAR | Status: DC | PRN
  Administered 2017-07-30: 40 ug via INTRAVENOUS
  Administered 2017-07-30: 80 ug via INTRAVENOUS
  Administered 2017-07-30: 40 ug via INTRAVENOUS
  Administered 2017-07-30 (×2): 80 ug via INTRAVENOUS
  Administered 2017-07-30: 40 ug via INTRAVENOUS
  Administered 2017-07-30: 80 ug via INTRAVENOUS

## 2017-07-30 MED ORDER — BUPIVACAINE HCL (PF) 0.5 % IJ SOLN
INTRAMUSCULAR | Status: DC | PRN
  Administered 2017-07-30: 5 mL

## 2017-07-30 MED ORDER — PHENYLEPHRINE 40 MCG/ML (10ML) SYRINGE FOR IV PUSH (FOR BLOOD PRESSURE SUPPORT)
PREFILLED_SYRINGE | INTRAVENOUS | Status: AC
  Filled 2017-07-30: qty 20

## 2017-07-30 MED ORDER — POTASSIUM CHLORIDE 10 MEQ/50ML IV SOLN
10.0000 meq | Freq: Every day | INTRAVENOUS | Status: DC | PRN
  Administered 2017-08-04 – 2017-08-18 (×35): 10 meq via INTRAVENOUS
  Filled 2017-07-30 (×37): qty 50

## 2017-07-30 MED ORDER — SODIUM CHLORIDE 0.9% FLUSH
10.0000 mL | Freq: Two times a day (BID) | INTRAVENOUS | Status: DC
  Administered 2017-07-31 – 2017-08-15 (×21): 10 mL

## 2017-07-30 MED ORDER — PROMETHAZINE HCL 25 MG/ML IJ SOLN: 6.2500 mg | INTRAMUSCULAR | Status: DC | PRN

## 2017-07-30 MED ORDER — AMLODIPINE BESYLATE 10 MG PO TABS: 10.0000 mg | ORAL_TABLET | Freq: Every day | ORAL | Status: DC

## 2017-07-30 MED ORDER — LACTATED RINGERS IV SOLN
INTRAVENOUS | Status: DC | PRN
  Administered 2017-07-30: 07:00:00 via INTRAVENOUS

## 2017-07-30 MED ORDER — LUNG SURGERY BOOK
Freq: Once | Status: AC
  Administered 2017-07-31: 08:00:00
  Filled 2017-07-30: qty 1

## 2017-07-30 MED ORDER — BISACODYL 5 MG PO TBEC
10.0000 mg | DELAYED_RELEASE_TABLET | Freq: Every day | ORAL | Status: DC
  Administered 2017-07-30 – 2017-08-20 (×6): 10 mg via ORAL
  Filled 2017-07-30 (×9): qty 2

## 2017-07-30 MED ORDER — ROCURONIUM BROMIDE 100 MG/10ML IV SOLN
INTRAVENOUS | Status: DC | PRN
  Administered 2017-07-30 (×2): 10 mg via INTRAVENOUS
  Administered 2017-07-30: 20 mg via INTRAVENOUS
  Administered 2017-07-30: 10 mg via INTRAVENOUS
  Administered 2017-07-30: 60 mg via INTRAVENOUS

## 2017-07-30 MED ORDER — LORAZEPAM 2 MG/ML IJ SOLN
INTRAMUSCULAR | Status: AC
  Filled 2017-07-30: qty 1

## 2017-07-30 MED ORDER — LORAZEPAM 2 MG/ML IJ SOLN
1.0000 mg | INTRAMUSCULAR | Status: DC | PRN
  Administered 2017-07-30: 1 mg via INTRAVENOUS

## 2017-07-30 MED ORDER — HALOPERIDOL LACTATE 5 MG/ML IJ SOLN
2.0000 mg | INTRAMUSCULAR | Status: AC | PRN
  Administered 2017-07-30 (×3): 2 mg via INTRAVENOUS
  Filled 2017-07-30: qty 1

## 2017-07-30 MED ORDER — LIDOCAINE HCL (CARDIAC) 20 MG/ML IV SOLN
INTRAVENOUS | Status: DC | PRN
  Administered 2017-07-30: 80 mg via INTRAVENOUS

## 2017-07-30 MED ORDER — BUPIVACAINE HCL (PF) 0.5 % IJ SOLN
INTRAMUSCULAR | Status: AC
  Filled 2017-07-30: qty 30

## 2017-07-30 MED ORDER — PHENYLEPHRINE 40 MCG/ML (10ML) SYRINGE FOR IV PUSH (FOR BLOOD PRESSURE SUPPORT)
PREFILLED_SYRINGE | INTRAVENOUS | Status: AC
  Filled 2017-07-30: qty 10

## 2017-07-30 MED ORDER — DIPHENHYDRAMINE HCL 12.5 MG/5ML PO ELIX: 12.5000 mg | ORAL_SOLUTION | Freq: Four times a day (QID) | ORAL | Status: DC | PRN

## 2017-07-30 MED ORDER — OXYBUTYNIN CHLORIDE 5 MG PO TABS
5.0000 mg | ORAL_TABLET | Freq: Four times a day (QID) | ORAL | Status: AC
  Filled 2017-07-30 (×6): qty 1

## 2017-07-30 MED ORDER — SODIUM BICARBONATE 8.4 % IV SOLN
INTRAVENOUS | Status: AC
  Filled 2017-07-30: qty 50

## 2017-07-30 SURGICAL SUPPLY — 112 items
ADH SKN CLS APL DERMABOND .7 (GAUZE/BANDAGES/DRESSINGS) ×1
ADH SKN CLS LQ APL DERMABOND (GAUZE/BANDAGES/DRESSINGS) ×1
APL SKNCLS STERI-STRIP NONHPOA (GAUZE/BANDAGES/DRESSINGS) ×1
APPLICATOR COTTON TIP 6IN STRL (MISCELLANEOUS) ×1 IMPLANT
BAG SPEC RTRVL LRG 6X4 10 (ENDOMECHANICALS) ×1
BENZOIN TINCTURE PRP APPL 2/3 (GAUZE/BANDAGES/DRESSINGS) ×2 IMPLANT
CANISTER SUCT 3000ML PPV (MISCELLANEOUS) ×4 IMPLANT
CATH KIT ON Q 5IN SLV (PAIN MANAGEMENT) IMPLANT
CATH KIT ON-Q SILVERSOAK 5 (CATHETERS) IMPLANT
CATH KIT ON-Q SILVERSOAK 5IN (CATHETERS) ×2 IMPLANT
CATH THORACIC 28FR (CATHETERS) IMPLANT
CATH THORACIC 36FR (CATHETERS) IMPLANT
CATH THORACIC 36FR RT ANG (CATHETERS) IMPLANT
CLIP VESOCCLUDE MED 24/CT (CLIP) ×1 IMPLANT
CLIP VESOCCLUDE MED 6/CT (CLIP) ×2 IMPLANT
CONN ST 1/4X3/8  BEN (MISCELLANEOUS)
CONN ST 1/4X3/8 BEN (MISCELLANEOUS) IMPLANT
CONN Y 3/8X3/8X3/8  BEN (MISCELLANEOUS)
CONN Y 3/8X3/8X3/8 BEN (MISCELLANEOUS) IMPLANT
CONT SPEC 4OZ CLIKSEAL STRL BL (MISCELLANEOUS) ×10 IMPLANT
COVER SURGICAL LIGHT HANDLE (MISCELLANEOUS) ×1 IMPLANT
DERMABOND ADHESIVE PROPEN (GAUZE/BANDAGES/DRESSINGS) ×1
DERMABOND ADVANCED (GAUZE/BANDAGES/DRESSINGS) ×1
DERMABOND ADVANCED .7 DNX12 (GAUZE/BANDAGES/DRESSINGS) ×1 IMPLANT
DERMABOND ADVANCED .7 DNX6 (GAUZE/BANDAGES/DRESSINGS) IMPLANT
DRAIN CHANNEL 28F RND 3/8 FF (WOUND CARE) IMPLANT
DRAIN CHANNEL 32F RND 10.7 FF (WOUND CARE) IMPLANT
DRAPE LAPAROSCOPIC ABDOMINAL (DRAPES) ×2 IMPLANT
DRAPE WARM FLUID 44X44 (DRAPE) ×1 IMPLANT
ELECT BLADE 6.5 EXT (BLADE) ×2 IMPLANT
ELECT REM PT RETURN 9FT ADLT (ELECTROSURGICAL) ×2
ELECTRODE REM PT RTRN 9FT ADLT (ELECTROSURGICAL) ×1 IMPLANT
FELT TEFLON 1X6 (MISCELLANEOUS) ×1 IMPLANT
GAUZE SPONGE 4X4 12PLY STRL (GAUZE/BANDAGES/DRESSINGS) IMPLANT
GAUZE SPONGE 4X4 12PLY STRL LF (GAUZE/BANDAGES/DRESSINGS) ×1 IMPLANT
GLOVE SURG SIGNA 7.5 PF LTX (GLOVE) ×4 IMPLANT
GOWN STRL REUS W/ TWL LRG LVL3 (GOWN DISPOSABLE) ×2 IMPLANT
GOWN STRL REUS W/ TWL XL LVL3 (GOWN DISPOSABLE) ×2 IMPLANT
GOWN STRL REUS W/TWL LRG LVL3 (GOWN DISPOSABLE) ×12
GOWN STRL REUS W/TWL XL LVL3 (GOWN DISPOSABLE) ×2
HANDLE STAPLE ENDO GIA SHORT (STAPLE)
HEMOSTAT SURGICEL 2X14 (HEMOSTASIS) IMPLANT
KIT BASIN OR (CUSTOM PROCEDURE TRAY) ×2 IMPLANT
KIT ROOM TURNOVER OR (KITS) ×2 IMPLANT
KIT SUCTION CATH 14FR (SUCTIONS) ×2 IMPLANT
NS IRRIG 1000ML POUR BTL (IV SOLUTION) ×6 IMPLANT
PACK CHEST (CUSTOM PROCEDURE TRAY) ×2 IMPLANT
PAD ARMBOARD 7.5X6 YLW CONV (MISCELLANEOUS) ×4 IMPLANT
POUCH ENDO CATCH II 15MM (MISCELLANEOUS) ×2 IMPLANT
POUCH SPECIMEN RETRIEVAL 10MM (ENDOMECHANICALS) ×1 IMPLANT
RELOAD STAPLE 35X2.5 WHT THIN (STAPLE) IMPLANT
RELOAD STAPLE 60 3.8 GOLD REG (STAPLE) IMPLANT
RELOAD STAPLE 60 4.1 GRN THCK (STAPLE) IMPLANT
RELOAD STAPLE 60 BLK VRY/THCK (STAPLE) IMPLANT
RELOAD STAPLER 60MM BLK (STAPLE) ×1 IMPLANT
RELOAD STAPLER GOLD 60MM (STAPLE) ×9 IMPLANT
RELOAD STAPLER GREEN 60MM (STAPLE) ×6 IMPLANT
SCISSORS ENDO CVD 5DCS (MISCELLANEOUS) IMPLANT
SEALANT PROGEL (MISCELLANEOUS) IMPLANT
SEALANT SURG COSEAL 4ML (VASCULAR PRODUCTS) IMPLANT
SEALANT SURG COSEAL 8ML (VASCULAR PRODUCTS) IMPLANT
SHEARS HARMONIC HDI 20CM (ELECTROSURGICAL) ×1 IMPLANT
SOLUTION ANTI FOG 6CC (MISCELLANEOUS) ×2 IMPLANT
SPECIMEN JAR MEDIUM (MISCELLANEOUS) ×1 IMPLANT
SPONGE INTESTINAL PEANUT (DISPOSABLE) ×8 IMPLANT
SPONGE TONSIL 1 RF SGL (DISPOSABLE) ×2 IMPLANT
STAPLE ECHEON FLEX 60 POW ENDO (STAPLE) ×1 IMPLANT
STAPLE RELOAD 2.5MM WHITE (STAPLE) ×14 IMPLANT
STAPLER ENDO GIA 12 SHRT THIN (STAPLE) IMPLANT
STAPLER ENDO GIA 12MM SHORT (STAPLE) IMPLANT
STAPLER RELOAD 60MM BLK (STAPLE) ×2
STAPLER RELOAD GOLD 60MM (STAPLE) ×18
STAPLER RELOAD GREEN 60MM (STAPLE) ×12
STAPLER VASCULAR ECHELON 35 (CUTTER) ×1 IMPLANT
SUT PROLENE 4 0 RB 1 (SUTURE) ×6
SUT PROLENE 4-0 RB1 .5 CRCL 36 (SUTURE) IMPLANT
SUT SILK  1 MH (SUTURE) ×2
SUT SILK 1 MH (SUTURE) ×2 IMPLANT
SUT SILK 1 TIES 10X30 (SUTURE) ×1 IMPLANT
SUT SILK 2 0 SH (SUTURE) IMPLANT
SUT SILK 2 0SH CR/8 30 (SUTURE) IMPLANT
SUT SILK 3 0 SH 30 (SUTURE) IMPLANT
SUT SILK 3 0 SH CR/8 (SUTURE) ×1 IMPLANT
SUT SILK 3 0SH CR/8 30 (SUTURE) ×1 IMPLANT
SUT VIC AB 0 CTX 27 (SUTURE) IMPLANT
SUT VIC AB 1 CTX 27 (SUTURE) ×1 IMPLANT
SUT VIC AB 1 CTX 36 (SUTURE) ×2
SUT VIC AB 1 CTX36XBRD ANBCTR (SUTURE) IMPLANT
SUT VIC AB 2-0 CT1 27 (SUTURE)
SUT VIC AB 2-0 CT1 TAPERPNT 27 (SUTURE) IMPLANT
SUT VIC AB 2-0 CTX 27 (SUTURE) ×1 IMPLANT
SUT VIC AB 2-0 CTX 36 (SUTURE) ×1 IMPLANT
SUT VIC AB 2-0 UR6 27 (SUTURE) ×1 IMPLANT
SUT VIC AB 3-0 MH 27 (SUTURE) IMPLANT
SUT VIC AB 3-0 SH 27 (SUTURE)
SUT VIC AB 3-0 SH 27X BRD (SUTURE) IMPLANT
SUT VIC AB 3-0 X1 27 (SUTURE) ×3 IMPLANT
SUT VICRYL 0 UR6 27IN ABS (SUTURE) IMPLANT
SUT VICRYL 2 TP 1 (SUTURE) IMPLANT
SWAB CULTURE ESWAB REG 1ML (MISCELLANEOUS) IMPLANT
SYR 20ML ECCENTRIC (SYRINGE) ×1 IMPLANT
SYSTEM SAHARA CHEST DRAIN ATS (WOUND CARE) ×2 IMPLANT
TAPE CLOTH SURG 4X10 WHT LF (GAUZE/BANDAGES/DRESSINGS) ×1 IMPLANT
TIP APPLICATOR SPRAY EXTEND 16 (VASCULAR PRODUCTS) IMPLANT
TOWEL GREEN STERILE (TOWEL DISPOSABLE) ×2 IMPLANT
TOWEL GREEN STERILE FF (TOWEL DISPOSABLE) ×2 IMPLANT
TRAY FOLEY W/METER SILVER 16FR (SET/KITS/TRAYS/PACK) ×2 IMPLANT
TROCAR XCEL BLADELESS 5X75MML (TROCAR) ×2 IMPLANT
TROCAR XCEL NON-BLD 5MMX100MML (ENDOMECHANICALS) IMPLANT
TUNNELER SHEATH ON-Q 11GX8 DSP (PAIN MANAGEMENT) ×1 IMPLANT
WATER STERILE IRR 1000ML POUR (IV SOLUTION) ×4 IMPLANT
YANKAUER SUCT BULB TIP NO VENT (SUCTIONS) ×1 IMPLANT

## 2017-07-30 NOTE — Anesthesia Procedure Notes (Signed)
Procedure Name: Intubation Date/Time: 07/30/2017 8:01 AM Performed by: Neldon Newport Pre-anesthesia Checklist: Timeout performed, Patient being monitored, Suction available, Emergency Drugs available and Patient identified Patient Re-evaluated:Patient Re-evaluated prior to induction Oxygen Delivery Method: Circle system utilized Preoxygenation: Pre-oxygenation with 100% oxygen Induction Type: IV induction, Rapid sequence and Cricoid Pressure applied Ventilation: Mask ventilation without difficulty and Oral airway inserted - appropriate to patient size Laryngoscope Size: Mac and 3 Grade View: Grade III Tube type: Oral Endobronchial tube: Left, Double lumen EBT and EBT position confirmed by fiberoptic bronchoscope and 37 Fr Tube size: 7.0 mm Number of attempts: 3 Airway Equipment and Method: Stylet,  Bougie stylet,  Video-laryngoscopy and Fiberoptic brochoscope Placement Confirmation: ETT inserted through vocal cords under direct vision,  positive ETCO2 and breath sounds checked- equal and bilateral Dental Injury: Bloody posterior oropharynx

## 2017-07-30 NOTE — Brief Op Note (Addendum)
07/30/2017  1:15 PM  PATIENT:  Kathleen Guerrero  71 y.o. female  PRE-OPERATIVE DIAGNOSIS:  RUL NODULE  POST-OPERATIVE DIAGNOSIS:  ADENOCARCINOMA- CLINICAL STAGE IA  PROCEDURE:  Procedure(s):  VIDEO ASSISTED THORACOSCOPY  -Right Upper Wedge Resection -Right Upper Lobectomy -Lymph Node Dissection -Insertion of ON-Q Anesthetic Catheter  SURGEON:  Surgeon(s) and Role:    * [name], MD - Primary  PHYSICIAN ASSISTANT: Ellwood Handler PA-C  ANESTHESIA:   general  EBL:  Total I/O In: 2600 [I.V.:1500; Blood:600; IV Piggyback:500] Out: 1275 [Urine:425; Blood:850]  BLOOD ADMINISTERED:2 U CC PRBC  DRAINS: 28 Straight Chest Tube   LOCAL MEDICATIONS USED:  MARCAINE     SPECIMEN:  Source of Specimen:  Right Upper Lobe Wedge Resection, Right Upper Lobe, Lymph Node Dissections  DISPOSITION OF SPECIMEN:  PATHOLOGY  COUNTS:  YES  TOURNIQUET:  * No tourniquets in log *  DICTATION: .Dragon Dictation  PLAN OF CARE: Admit to inpatient   PATIENT DISPOSITION:  ICU - extubated and stable.   Delay start of Pharmacological VTE agent (>24hrs) due to surgical blood loss or risk of bleeding: no

## 2017-07-30 NOTE — Anesthesia Postprocedure Evaluation (Signed)
Anesthesia Post Note  Patient: WAVERLY TARQUINIO  Procedure(s) Performed: Procedure(s) (LRB): VIDEO ASSISTED THORACOSCOPY (VATS)/WEDGE RESECTION (Right) LOBECTOMY (Right)     Patient location during evaluation: PACU Anesthesia Type: General Level of consciousness: sedated Pain management: pain level controlled Vital Signs Assessment: post-procedure vital signs reviewed and stable Respiratory status: spontaneous breathing and respiratory function stable Cardiovascular status: stable Postop Assessment: no apparent nausea or vomiting Anesthetic complications: no    Last Vitals:  Vitals:   07/30/17 1440 07/30/17 1500  BP:  (!) 103/56  Pulse: 69 71  Resp: 12 13  Temp:  (!) 36.1 C  SpO2: 98% 97%    Last Pain:  Vitals:   07/30/17 0601  TempSrc: Oral                 Abrina Petz DANIEL

## 2017-07-30 NOTE — Anesthesia Procedure Notes (Signed)
Arterial Line Insertion Start/End9/26/2018 6:55 AM, 07/30/2017 7:08 AM Performed by: Neldon Newport, CRNA  Patient location: OOR procedure area. Preanesthetic checklist: patient identified, surgical consent, monitors and equipment checked and timeout performed Left, radial was placed Catheter size: 20 G Hand hygiene performed  and maximum sterile barriers used   Attempts: 1 Procedure performed without using ultrasound guided technique. Following insertion, dressing applied and Biopatch. Post procedure assessment: normal and unchanged  Patient tolerated the procedure well with no immediate complications.

## 2017-07-30 NOTE — Progress Notes (Signed)
      [address]       [place] [address]             [phone]      CTSp for worsening agitation  BP (!) 98/56   Pulse 87   Temp (!) 97 F (36.1 C)   Resp 11   SpO2 98%   She remains oriented to person and place but is pulling at tubes and lines She developed an increased air leak Repeat CXR shows no significant change, CT still in place Has received ativan and haldol but still refractory delirium Will try precedex  Remo Lipps C. Roxan Hockey, MD [facility] [phone]

## 2017-07-30 NOTE — Op Note (Signed)
Kathleen Guerrero, Kathleen Guerrero                ACCOUNT NO.:  [id number]  MEDICAL RECORD NO.:  [id number]  LOCATION:  [facility]                         FACILITY:  [facility]  PHYSICIAN:  Revonda Standard. Roxan Hockey, M.D.DATE OF BIRTH:  [birth year]  DATE OF PROCEDURE:  07/30/2017 DATE OF DISCHARGE:                              OPERATIVE REPORT   PREOPERATIVE DIAGNOSIS:  Right upper lobe lung nodule.  POSTOPERATIVE DIAGNOSIS:  Adenocarcinoma, right upper lobe, clinical stage IA.  PROCEDURE:   Right video-assisted thoracoscopy, Wedge resection of right upper lobe nodule, Thoracoscopic right upper lobectomy, Lymph node dissection, and Insertion of On-Q local anesthetic catheter.  SURGEON:  Revonda Standard. Roxan Hockey, MD.  ASSISTANTEllwood Handler, PA.  ANESTHESIA:  General.  FINDINGS:  A 2-cm mass on lateral aspect of the right upper lobe. Frozen section revealed adenocarcinoma, incomplete fissures, multiple enlarged but otherwise benign-appearing lymph nodes.  CLINICAL NOTE:  Kathleen Guerrero is a 71 year old woman with a history of tobacco abuse, who was found to have a right upper lobe ground-glass opacity in 2017 during her evaluation for stroke.  A recent followup CT showed an increase in size and she was referred for surgical resection. The indications, risks, benefits, and alternatives were discussed in detail with the patient.  She understood and accepted the risks and agreed to proceed.  OPERATIVE NOTE:  Kathleen Guerrero was brought to the preoperative holding area on July 30, 2017.  Anesthesia placed a central line and an arterial blood pressure monitoring line.  She was taken to the operating room, anesthetized, and intubated.  Intravenous antibiotics were administered. A Foley catheter was placed.  Sequential compression devices were placed on the calves for DVT prophylaxis.  She was placed in a left lateral decubitus position, and the right chest was prepped and draped in the usual sterile fashion.   Single lung ventilation of the left lung was initiated and was tolerated well throughout the procedure.  An incision was made in the seventh interspace in the midaxillary line. A 5-mm port was inserted, and the thoracoscope was advanced into the chest.  There was no pleural effusion.  There was good isolation of the right lung, although it did take a prolonged period of time for the right lung to deflate.  A working incision was made in the fourth interspace anterolaterally.  No rib spreading was performed during the procedure.  The right upper lobe was inspected.  It was noted that the there was only a hint of a minor fissure.  On the lateral aspect of the right upper lobe, there was a palpable nodule which corresponded to the finding on CT scan.  A wedge resection was performed with sequential firings of an Echelon endoscopic stapler.  A 60-mm stapler was used and gold cartridges were used for the wedge resection.  The specimen was placed into an endoscopic retrieval bag, removed and sent for frozen section.  While awaiting the results of the frozen section, an incision was made posteriorly and an On-Q local anesthetic catheter was tunneled into a subpleural location.  It was secured to the skin with 3-0 silk suture and primed with 5 mL of 0.5% Marcaine.  The frozen section returned showing  adenocarcinoma and decision was made to proceed with right upper lobectomy as discussed with the patient preoperatively.  The inferior ligament was divided with the Harmonic scalpel.  A level 9 node was removed.  All nodes that were encountered were removed and sent for permanent pathology.  Because of the body habitus with a relatively small chest cavity and the slow deflation of the lung, the pleural reflection could not be divided posteriorly at this time.  Anteriorly, it was difficult to visualize the pulmonary veins and the division of the minor fissure was begun with sequential firings of the  Echelon stapler again using gold cartridges.  The right middle lobe vein then was identified and those branches were preserved.  There were multiple upper lobe vein branches into the superior pulmonary vein.  Dissection was begun on these branches.  The smaller branches were divided near the lung with the Harmonic scalpel.  Larger branches were stapled.  The Harmonic scalpel then was used to divide the pleura at the hilum superiorly.  The azygos vein was retracted and multiple right paratracheal lymph nodes were removed.  A large common trunk of the anterior and apical arterial branches to the upper lobe was identified, but not divided at this time.  Nodes were removed from around the pulmonary vein branches.  Attempting to get around the final pulmonary vein branches, bleeding was encountered.  This was quite brisk. Pressure was applied with a gauze on sponge stick.  This was only partially successful.  It was unclear initially whether the bleeding was coming from the superior pulmonary vein or from the main pulmonary artery behind the superior vein branches.  Ultimately, it was determined the bleeding was coming from the superior pulmonary vein.  After holding pressure for 10 minutes when pressure was released, there was continued bleeding.  A smaller feeding branch of the vein was divided with the Harmonic scalpel.  The larger branch was doubly clipped on both sides and divided.  It was clear that the bleeding was coming from the backside of the superior pulmonary vein.  A 4-0 Prolene suture was used to control this bleeding.  The patient did have a drop in blood pressure and did require blood transfusion.  It should be noted that an additional incision was made anterior to the first port incision for retraction and also for placement of the stapler with division of the pulmonary vein branches. The stapler was used to divide the main trunk of the superior pulmonary vein. Inspection of  the major fissure revealed that it also was incomplete with no clear plane to the pulmonary artery from that direction, so the lobectomy was completed working from anterior to posterior.  The anteroapical arterial trunk was divided with the endoscopic vascular stapler.  The dissection was carried into the minor fissure.  The posterior ascending branch was identified.  This was dissected out, encircled and divided with a vascular stapler.  Dissection was carried along the plane of the posterior right upper lobe pulmonary vein.  This vein had already been divided near the hilum, but it was divided again to allow exposure of the right upper lobe bronchus.  The staple line was continued through the portion of the major fissure between the superior segment of the upper lobe.  There were multiple enlarged nodes around the right upper lobe bronchus, which were removed and sent as permanent specimens.  The Echelon stapler with a green cartridge was placed across the right upper lobe bronchus and closed.  A  test inflation showed aeration of both the middle and lower lobes.  The stapler was fired transecting the bronchus. The right upper lobe was placed into an endoscopic retrieval bag and removed and sent for permanent pathology.   Multiple level 7 subcarinal nodes were identified.  These were removed and sent for pathology.  The chest was copiously irrigated with warm saline.  A test inflation showed good aeration of the middle and lower lobes.  The middle lobe was tacked to the lower lobe with a single firing of the Echelon stapler.  There was some air leakage from the parenchyma of the superior segment of the lower lobe.  BioGlue was applied to this area.  A 28-French chest tube was placed through the anterior most port incision, secured with #1 silk suture.  The lower and middle lobes were reinflated.  The original port incision was closed in standard fashion as was the working incision in  the fourth interspace.  The patient was placed back in a supine position. The chest tube was placed to suction.  She was extubated in the operating room and taken to postanesthetic care unit in good condition. All sponge, needle, and instrument counts were correct at the end of the procedure.     Revonda Standard Roxan Hockey, M.D.     SCH/MEDQ  D:  07/30/2017  T:  07/30/2017  Job:  [id number]

## 2017-07-30 NOTE — Progress Notes (Signed)
SPoke with DR singer re: BP being low upon arrival to pacu, Neo & ALbumin orderd

## 2017-07-30 NOTE — Progress Notes (Signed)
      [address]       [place] [address]             [phone]      Became agitated and was pulling at central line  BP (!) 111/51   Pulse 77   Temp (!) 97 F (36.1 C)   Resp 12   SpO2 98%    Intake/Output Summary (Last 24 hours) at 07/30/17 1737 Last data filed at 07/30/17 1700  Gross per 24 hour  Intake          4643.13 ml  Output             1805 ml  Net          2838.13 ml   Oriented to person, place Lungs clear no wheezing  Will give ativan IV  Revonda Standard. Roxan Hockey, MD [facility] [phone]

## 2017-07-30 NOTE — Anesthesia Procedure Notes (Signed)
Central Venous Catheter Insertion Performed by: Annye Asa, anesthesiologist Start/End9/26/2018 6:44 AM, 07/30/2017 6:58 AM Patient location: Pre-op. Preanesthetic checklist: patient identified, IV checked, risks and benefits discussed, surgical consent, monitors and equipment checked, pre-op evaluation, timeout performed and anesthesia consent Position: Trendelenburg Lidocaine 1% used for infiltration and patient sedated Hand hygiene performed , maximum sterile barriers used  and Seldinger technique used Catheter size: 8 Fr Total catheter length 16. Central line was placed.Double lumen Procedure performed using ultrasound guided technique. Ultrasound Notes:anatomy identified, needle tip was noted to be adjacent to the nerve/plexus identified, no ultrasound evidence of intravascular and/or intraneural injection and image(s) printed for medical record Attempts: 1 Following insertion, line sutured, dressing applied and Biopatch. Post procedure assessment: blood return through all ports, free fluid flow and no air  Patient tolerated the procedure well with no immediate complications. Additional procedure comments: CVP: Timeout, sterile prep, drape, FBP R neck.  Trendelenburg position.  1% lido local, finder and trocar RIJ 1st pass with US guidance.  2 lumen placed over J wire. Biopatch and sterile dressing on.  Patient tolerated well.  VSS.  Jenita Seashore, MD.

## 2017-07-30 NOTE — Interval H&P Note (Signed)
History and Physical Interval Note:  07/30/2017 7:30 AM  Kathleen Guerrero  has presented today for surgery, with the diagnosis of RUL NODULE  The various methods of treatment have been discussed with the patient and family. After consideration of risks, benefits and other options for treatment, the patient has consented to  Procedure(s): VIDEO ASSISTED THORACOSCOPY (VATS)/WEDGE RESECTION (Right) possible LOBECTOMY (Right) as a surgical intervention .  The patient's history has been reviewed, patient examined, no change in status, stable for surgery.  I have reviewed the patient's chart and labs.  Questions were answered to the patient's satisfaction.     [name]

## 2017-07-30 NOTE — Transfer of Care (Signed)
Immediate Anesthesia Transfer of Care Note  Patient: Kathleen Guerrero  Procedure(s) Performed: Procedure(s): VIDEO ASSISTED THORACOSCOPY (VATS)/WEDGE RESECTION (Right) LOBECTOMY (Right)  Patient Location: PACU  Anesthesia Type:General  Level of Consciousness: awake and alert   Airway & Oxygen Therapy: Patient Spontanous Breathing and Patient connected to face mask oxygen  Post-op Assessment: Report given to RN, Post -op Vital signs reviewed and stable and Patient moving all extremities X 4  Post vital signs: Reviewed and stable  Last Vitals:  Vitals:   07/30/17 0601  BP: 121/73  Pulse: 60  Resp: 18  Temp: 37.1 C  SpO2: 98%    Last Pain:  Vitals:   07/30/17 0601  TempSrc: Oral      Patients Stated Pain Goal: 3 (52/84/13 2440)  Complications: No apparent anesthesia complications

## 2017-07-30 NOTE — H&P (View-Only) (Signed)
PCP is Golden Circle, FNP Referring Provider is Golden Circle, FNP  Chief Complaint  Patient presents with  . Lung Lesion    Surgical eval, Chest CT 07/09/17    HPI: Kathleen Guerrero is sent for consultation regarding a right upper lobe nodule.  Kathleen Guerrero is a 71 year old woman with a past medical she significant for tobacco abuse (one pack per day 50 years), hypertension, dyslipidemia, and a stroke. She had a stroke affecting her left leg in September 2017. As part of her evaluation she had a CT of the neck. There was a right upper lobe groundglass opacity noted. A CT of the chest showed a 13 x 9 x 8 mm groundglass opacity in follow-up was recommended. She was seen in consultation by Dr. Melvyn Novas. Follow-up CTs and November and March showed no significant change. She recently had another follow-up CT which showed an increase in size to 13 x 20 mm. Dr. Melvyn Novas referred her for consideration for surgical resection.  She has had a nearly complete recovery from her stroke. She is able to ambulate without difficulty. She denies any chest pain, pressure, or tightness at rest or with exertion. She denies shortness of breath at rest or with exertion. She denies cough, hemoptysis, and wheezing. She still works. She lives by herself. She has not had any change in appetite or weight loss. She has not had any headaches or visual changes. Zubrod Score: At the time of surgery this patient's most appropriate activity status/level should be described as: [x]     0    Normal activity, no symptoms []     1    Restricted in physical strenuous activity but ambulatory, able to do out light work []     2    Ambulatory and capable of self care, unable to do work activities, up and about >50 % of waking hours                              []     3    Only limited self care, in bed greater than 50% of waking hours []     4    Completely disabled, no self care, confined to bed or chair []     5    Moribund   Past Medical  History:  Diagnosis Date  . Hypertension   . Stroke ([place])   . Tobacco use     Past Surgical History:  Procedure Laterality Date  . DERMOID CYST  EXCISION    . EP IMPLANTABLE DEVICE N/A 07/23/2016   Procedure: Loop Recorder Insertion;  Surgeon: Thompson Grayer, MD;  Location: [facility];  Service: Cardiovascular;  Laterality: N/A;  . Intestinal obstruction    . TEE WITHOUT CARDIOVERSION N/A 07/23/2016   Procedure: TRANSESOPHAGEAL ECHOCARDIOGRAM (TEE);  Surgeon: Lelon Perla, MD;  Location: [facility]OSCOPY;  Service: Cardiovascular;  Laterality: N/A;    Family History  Problem Relation Age of Onset  . Kidney disease Maternal Grandmother   . Cancer Maternal Grandfather     Social History Social History  Substance Use Topics  . Smoking status: Former Smoker    Packs/day: 1.00    Years: 50.00    Types: Cigarettes    Quit date: 07/20/2016  . Smokeless tobacco: Never Used  . Alcohol use Yes     Comment: Rarely    Current Outpatient Prescriptions  Medication Sig Dispense Refill  . amLODipine (NORVASC) 10 MG tablet  TAKE 1 TABLET BY MOUTH EVERY DAY 90 tablet 0  . aspirin 325 MG tablet TAKE 1 TABLET BY MOUTH EVERY DAY 30 tablet 11  . atorvastatin (LIPITOR) 40 MG tablet TAKE 1 TABLET BY MOUTH EVERY DAY AT 6 p.m. 90 tablet 0  . diphenhydrAMINE (BENADRYL) 25 MG tablet Take 25 mg by mouth at bedtime.     . metoprolol (LOPRESSOR) 50 MG tablet Take 1 tablet (50 mg total) by mouth 2 (two) times daily. 60 tablet 11  . nicotine polacrilex ([employer] NICOTINE) 2 MG lozenge Take 2 mg by mouth as needed for smoking cessation.     No current facility-administered medications for this visit.     No Known Allergies  Review of Systems  Constitutional: Negative for activity change, appetite change, fatigue, fever and unexpected weight change.       Rash  HENT: Negative for trouble swallowing and voice change.   Eyes: Negative for visual disturbance.  Respiratory: Negative for cough, shortness  of breath and wheezing.   Cardiovascular: Negative for chest pain, palpitations and leg swelling.  Gastrointestinal: Negative for abdominal pain and blood in stool.  Genitourinary: Negative for difficulty urinating and dysuria.  Musculoskeletal: Negative for arthralgias and joint swelling.  Neurological: Negative for seizures, speech difficulty and headaches. Weakness: Left leg, resolved.  Hematological: Negative for adenopathy. Does not bruise/bleed easily.    BP 136/90   Pulse 70   Resp 20   Ht 5' 2.75" (1.594 m)   Wt 185 lb (83.9 kg)   SpO2 98% Comment: RA  BMI 33.03 kg/m  Physical Exam   Diagnostic Tests: CT CHEST WITHOUT CONTRAST  TECHNIQUE: Multidetector CT imaging of the chest was performed following the standard protocol without IV contrast.  COMPARISON:  01/02/2017  FINDINGS: Cardiovascular: The heart size is normal. No pericardial effusion. Coronary artery calcification is evident. Atherosclerotic calcification is noted in the wall of the thoracic aorta.  Mediastinum/Nodes: No mediastinal lymphadenopathy. No evidence for gross hilar lymphadenopathy although assessment is limited by the lack of intravenous contrast on today's study. The esophagus has normal imaging features. Small hiatal hernia noted. There is no axillary lymphadenopathy.  Lungs/Pleura: Peripheral right upper lobe sub solid nodule has progressed in the interval measuring 13 x 20 mm today compared to 8 x 14 mm previously. No other suspicious pulmonary nodule or mass. No focal airspace consolidation. No pulmonary edema or pleural effusion.  Upper Abdomen: Small stones are seen in the upper pole of each kidney, incompletely visualized.  Musculoskeletal: Bone windows reveal no worrisome lytic or sclerotic osseous lesions.  IMPRESSION: 1. Interval progression of the non solid right upper lobe pulmonary nodule measuring 20 mm maximum dimension today compared to 14 mm previously.  Adenocarcinoma remains a concern. Thoracic surgical consultation suggested. 2. Thoracic aortic atherosclerosis. 3. Bilateral nephrolithiasis.   Electronically Signed   By: Misty Stanley M.D.   On: 07/09/2017 10:13 I personally reviewed the CT images and concur with the findings noted above. There has been a significant increase in comparison to her films from March of this year and prior to that.  Pulmonary function tests November 2017 FVC 3.19 (100%) FEV1 2.36 (97%)  Impression: Kathleen Guerrero is a 71 year old former smoker (50 pack years) who was incidentally found to have a right upper lobe soft solid nodule on a CT of the neck done during the evaluation of her stroke a year ago. This was followed initially but on her recent CT has increased in size significantly from 8 x  14 mm to 13 x 20 mm. In addition on the soft tissue windows are does appear to be more solid component than was present previously. These findings are highly suspicious for a low-grade adenocarcinoma. Infectious or inflammatory nodules are also possible, although in my opinion far less likely. Given the size increase it is imperative that we make a definitive diagnosis and treat appropriately.  We discussed potential treatment options including surgical resection and stereotactic radiation. I informed her of the relative advantages and disadvantages to each approach. She would be at shouldn't pursuing surgical resection. We discussed potential was negative definitive diagnosis including CT-guided biopsy, bronchoscopic biopsy, or VATS with wedge resection. Of those the wedge resection as definitive. In her case if either of the other modalities were positive I would recommend surgery and if they were negative would not rule out the possibility of cancer because of the relatively high incidence of false negative biopsies.  I recommended that we proceed with right VATS for wedge resection and possible right upper lobectomy. She  understands the decision as to whether to proceed with a larger resection would be dependent on the intraoperative frozen section. We discussed the general nature of the operation including the need for general anesthesia, incisions to be used, the use of a drainage tube postoperatively, the expected hospital stay, and the overall recovery. I informed her the indications, risks, benefits, and alternatives. She understands the risk include, but are not limited to death, MI, DVT, PE, stroke, infection, bleeding, possible need for transfusion, prolonged air leak, arrhythmias, as well as possibility of other unforeseeable complications. She understands that while still low risk for stroke is higher because of her history of stroke a year ago.  She accepts the risks and wishes to proceed.  Tobacco abuse- has not smoked in a year. Does still use nicotine lozenges.  Plan:  Right VATS, wedge resection, possible right upper lobectomy on Wednesday, 07/30/2017  [name], MD [facility] [phone]

## 2017-07-31 ENCOUNTER — Inpatient Hospital Stay (HOSPITAL_COMMUNITY): Payer: Medicare HMO

## 2017-07-31 ENCOUNTER — Encounter (HOSPITAL_COMMUNITY): Payer: Self-pay | Admitting: Thoracic Surgery (Cardiothoracic Vascular Surgery)

## 2017-07-31 LAB — BASIC METABOLIC PANEL
ANION GAP: 8 (ref 5–15)
BUN: 12 mg/dL (ref 6–20)
CO2: 25 mmol/L (ref 22–32)
Calcium: 7.8 mg/dL — ABNORMAL LOW (ref 8.9–10.3)
Chloride: 107 mmol/L (ref 101–111)
Creatinine, Ser: 0.81 mg/dL (ref 0.44–1.00)
GLUCOSE: 158 mg/dL — AB (ref 65–99)
POTASSIUM: 3.8 mmol/L (ref 3.5–5.1)
Sodium: 140 mmol/L (ref 135–145)

## 2017-07-31 LAB — BLOOD GAS, ARTERIAL
Acid-Base Excess: 0.4 mmol/L (ref 0.0–2.0)
Bicarbonate: 23.7 mmol/L (ref 20.0–28.0)
DRAWN BY: 414221
O2 Content: 3 L/min
O2 Saturation: 98.2 %
PH ART: 7.473 — AB (ref 7.350–7.450)
Patient temperature: 98.6
pCO2 arterial: 32.7 mmHg (ref 32.0–48.0)
pO2, Arterial: 106 mmHg (ref 83.0–108.0)

## 2017-07-31 LAB — CBC
HEMATOCRIT: 25.6 % — AB (ref 36.0–46.0)
HEMOGLOBIN: 8.5 g/dL — AB (ref 12.0–15.0)
MCH: 29.1 pg (ref 26.0–34.0)
MCHC: 33.2 g/dL (ref 30.0–36.0)
MCV: 87.7 fL (ref 78.0–100.0)
Platelets: 165 10*3/uL (ref 150–400)
RBC: 2.92 MIL/uL — ABNORMAL LOW (ref 3.87–5.11)
RDW: 15.1 % (ref 11.5–15.5)
WBC: 12.5 10*3/uL — AB (ref 4.0–10.5)

## 2017-07-31 LAB — GLUCOSE, CAPILLARY: Glucose-Capillary: 180 mg/dL — ABNORMAL HIGH (ref 65–99)

## 2017-07-31 MED ORDER — POTASSIUM CHLORIDE 10 MEQ/50ML IV SOLN
10.0000 meq | INTRAVENOUS | Status: AC
  Administered 2017-07-31 (×3): 10 meq via INTRAVENOUS
  Filled 2017-07-31 (×3): qty 50

## 2017-07-31 MED ORDER — ENOXAPARIN SODIUM 40 MG/0.4ML ~~LOC~~ SOLN
40.0000 mg | SUBCUTANEOUS | Status: DC
  Administered 2017-07-31 – 2017-08-02 (×3): 40 mg via SUBCUTANEOUS
  Filled 2017-07-31 (×3): qty 0.4

## 2017-07-31 MED ORDER — KETOROLAC TROMETHAMINE 15 MG/ML IJ SOLN
7.5000 mg | Freq: Four times a day (QID) | INTRAMUSCULAR | Status: AC
  Administered 2017-07-31 (×3): 7.5 mg via INTRAVENOUS
  Filled 2017-07-31 (×3): qty 1

## 2017-07-31 MED ORDER — LEVALBUTEROL HCL 0.63 MG/3ML IN NEBU: 0.6300 mg | INHALATION_SOLUTION | Freq: Four times a day (QID) | RESPIRATORY_TRACT | Status: DC | PRN

## 2017-07-31 MED ORDER — MORPHINE SULFATE (PF) 4 MG/ML IV SOLN
1.0000 mg | INTRAVENOUS | Status: DC | PRN
  Administered 2017-07-31 – 2017-08-01 (×8): 1 mg via INTRAVENOUS
  Filled 2017-07-31 (×8): qty 1

## 2017-07-31 MED ORDER — POTASSIUM CHLORIDE IN NACL 20-0.45 MEQ/L-% IV SOLN
INTRAVENOUS | Status: DC
Start: 2017-07-31 — End: 2017-08-02
  Administered 2017-07-31 – 2017-08-02 (×5): via INTRAVENOUS
  Filled 2017-07-31 (×6): qty 1000

## 2017-07-31 NOTE — Progress Notes (Signed)
1 Day Post-Op Procedure(s) (LRB): VIDEO ASSISTED THORACOSCOPY (VATS)/WEDGE RESECTION (Right) LOBECTOMY (Right) Subjective: Still varying between sedated and agitated "help me"  Objective: Vital signs in last 24 hours: Temp:  [97 F (36.1 C)-99.4 F (37.4 C)] 99.4 F (37.4 C) (09/27 0713) Pulse Rate:  [66-102] 75 (09/27 0700) Cardiac Rhythm: Normal sinus rhythm (09/27 0400) Resp:  [11-27] 24 (09/27 0700) BP: (73-142)/(38-74) 123/64 (09/27 0700) SpO2:  [93 %-100 %] 98 % (09/27 0700) Arterial Line BP: (56-137)/(42-96) 105/81 (09/26 2100)  Hemodynamic parameters for last 24 hours:    Intake/Output from previous day: 09/26 0701 - 09/27 0700 In: 5552.1 [I.V.:4301.1; Blood:601; IV Piggyback:650] Out: 6503 [Urine:2335; Blood:900; Chest Tube:470] Intake/Output this shift: No intake/output data recorded.  General appearance: delirious Neurologic: moving all 4 with good strength Heart: regular rate and rhythm Lungs: rhonchi bilaterally + air leak  Lab Results:  Recent Labs  07/30/17 1404 07/31/17 0545  WBC 20.9* 12.5*  HGB 9.5* 8.5*  HCT 29.3* 25.6*  PLT 181 165   BMET:  Recent Labs  07/28/17 1051  07/30/17 1158 07/31/17 0545  NA 137  < > 140 140  K 3.8  < > 4.4 3.8  CL 106  --   --  107  CO2 22  --   --  25  GLUCOSE 106*  --   --  158*  BUN 11  --   --  12  CREATININE 0.77  --   --  0.81  CALCIUM 9.6  --   --  7.8*  < > = values in this interval not displayed.  PT/INR:  Recent Labs  07/28/17 1051  LABPROT 13.0  INR 0.99   ABG    Component Value Date/Time   PHART 7.473 (H) 07/31/2017 0410   HCO3 23.7 07/31/2017 0410   TCO2 17 (L) 07/30/2017 1739   ACIDBASEDEF 9.0 (H) 07/30/2017 1739   O2SAT 98.2 07/31/2017 0410   CBG (last 3)   Recent Labs  07/30/17 1737  GLUCAP 180*    Assessment/Plan: S/P Procedure(s) (LRB): VIDEO ASSISTED THORACOSCOPY (VATS)/WEDGE RESECTION (Right) LOBECTOMY (Right) -NEURO- postoperative delirium, now on precedex,  still agitated, uncooperative and trying to pull tubes and lines when sedation lightened.   Dc PCA, will give low dose toradol for 24 hours. Low dose morphine if needed  CV- stable  RESP- s/p lobectomy. + air leak, worsened after becoming agitated and combative last night.  ABG OK  Uncooperative with pulmonary hygiene.  Continue nebs  RENAL- creatinine and lytes OK  ENDO- CBG mildly elevated  SCD + enoxaparin for DVT prophylaxis   LOS: 1 day    [name] 07/31/2017

## 2017-07-31 NOTE — Progress Notes (Signed)
Arterial line removed due to malposition. RT to collect ABG. Will continue to monitor.  Sherlie Ban, RN

## 2017-07-31 NOTE — Plan of Care (Signed)
Problem: Pain Managment: Goal: General experience of comfort will improve Outcome: Progressing CPOT 0 with morphine pushes; PCA discontinued

## 2017-07-31 NOTE — Progress Notes (Signed)
Patient ID: Kathleen Guerrero, female   DOB: [birth year], 71 y.o.   MRN: [id number] SICU Evening Rounds:  Has continued to have delirium today requiring restraints for safety. She is either agitated or sedated after pain meds and small doses of Ativan.   Sats 90% on [place]  RR15  Urine output ok CT output low. Still has small air leak.

## 2017-07-31 NOTE — Care Management Note (Signed)
Case Management Note Marvetta Gibbons RN, BSN Unit 4E-Case Manager-- [place] coverage [phone]  Patient Details  Name: Kathleen Guerrero MRN: [id number] Date of Birth: 1946/01/06  Subjective/Objective:  Pt admitted s/p VATS with wedge resection on 07/30/17                 Action/Plan: PTA pt lived at home - CM to follow for d/c needs  Expected Discharge Date:                  Expected Discharge Plan:  Home/Self Care  In-House Referral:     Discharge planning Services  CM Consult  Post Acute Care Choice:    Choice offered to:     DME Arranged:    DME Agency:     HH Arranged:    HH Agency:     Status of Service:  In process, will continue to follow  If discussed at Long Length of Stay Meetings, dates discussed:    Discharge Disposition:   Additional Comments:  Dawayne Patricia, RN 07/31/2017, 10:27 AM

## 2017-08-01 ENCOUNTER — Inpatient Hospital Stay (HOSPITAL_COMMUNITY): Payer: Medicare HMO

## 2017-08-01 ENCOUNTER — Inpatient Hospital Stay (HOSPITAL_COMMUNITY): Payer: Medicare HMO | Admitting: Anesthesiology

## 2017-08-01 LAB — TYPE AND SCREEN
ABO/RH(D): A POS
Antibody Screen: NEGATIVE
UNIT DIVISION: 0
UNIT DIVISION: 0
Unit division: 0
Unit division: 0
Unit division: 0
Unit division: 0

## 2017-08-01 LAB — COMPREHENSIVE METABOLIC PANEL
ALK PHOS: 31 U/L — AB (ref 38–126)
ALT: 15 U/L (ref 14–54)
AST: 21 U/L (ref 15–41)
Albumin: 2.6 g/dL — ABNORMAL LOW (ref 3.5–5.0)
Anion gap: 6 (ref 5–15)
BUN: 11 mg/dL (ref 6–20)
CALCIUM: 7.5 mg/dL — AB (ref 8.9–10.3)
CHLORIDE: 109 mmol/L (ref 101–111)
CO2: 24 mmol/L (ref 22–32)
CREATININE: 0.68 mg/dL (ref 0.44–1.00)
GFR calc Af Amer: >60 mL/min (ref >60)
Glucose, Bld: 115 mg/dL — ABNORMAL HIGH (ref 65–99)
Potassium: 3.9 mmol/L (ref 3.5–5.1)
SODIUM: 139 mmol/L (ref 135–145)
Total Bilirubin: 0.8 mg/dL (ref 0.3–1.2)
Total Protein: 4.7 g/dL — ABNORMAL LOW (ref 6.5–8.1)

## 2017-08-01 LAB — POCT I-STAT 3, ART BLOOD GAS (G3+)
Acid-base deficit: 1 mmol/L (ref 0.0–2.0)
BICARBONATE: 23.6 mmol/L (ref 20.0–28.0)
O2 SAT: 100 %
PCO2 ART: 39.3 mmHg (ref 32.0–48.0)
PO2 ART: 377 mmHg — AB (ref 83.0–108.0)
Patient temperature: 99.5
TCO2: 25 mmol/L (ref 22–32)
pH, Arterial: 7.389 (ref 7.350–7.450)

## 2017-08-01 LAB — PREPARE RBC (CROSSMATCH)

## 2017-08-01 LAB — BPAM RBC
BLOOD PRODUCT EXPIRATION DATE: 201810092359
BLOOD PRODUCT EXPIRATION DATE: 201810092359
BLOOD PRODUCT EXPIRATION DATE: 201810112359
Blood Product Expiration Date: 201810092359
Blood Product Expiration Date: 201810092359
Blood Product Expiration Date: 201810112359
ISSUE DATE / TIME: 201809261011
ISSUE DATE / TIME: 201809261011
ISSUE DATE / TIME: 201809261129
ISSUE DATE / TIME: 201809261129
ISSUE DATE / TIME: 201809261129
ISSUE DATE / TIME: 201809261129
UNIT TYPE AND RH: 6200
UNIT TYPE AND RH: 6200
UNIT TYPE AND RH: 6200
UNIT TYPE AND RH: 6200
Unit Type and Rh: 6200
Unit Type and Rh: 6200

## 2017-08-01 LAB — CBC
HCT: 22.1 % — ABNORMAL LOW (ref 36.0–46.0)
HEMOGLOBIN: 7.2 g/dL — AB (ref 12.0–15.0)
MCH: 29.4 pg (ref 26.0–34.0)
MCHC: 32.6 g/dL (ref 30.0–36.0)
MCV: 90.2 fL (ref 78.0–100.0)
PLATELETS: 143 10*3/uL — AB (ref 150–400)
RBC: 2.45 MIL/uL — AB (ref 3.87–5.11)
RDW: 14.9 % (ref 11.5–15.5)
WBC: 10.8 10*3/uL — AB (ref 4.0–10.5)

## 2017-08-01 MED ORDER — METOPROLOL TARTRATE 50 MG PO TABS: 50.0000 mg | ORAL_TABLET | Freq: Two times a day (BID) | ORAL | Status: DC

## 2017-08-01 MED ORDER — SODIUM CHLORIDE 0.9 % IV SOLN
Freq: Once | INTRAVENOUS | Status: AC
  Administered 2017-08-02: 10 mL/h via INTRAVENOUS

## 2017-08-01 MED ORDER — SODIUM CHLORIDE 0.9 % IV SOLN: INTRAVENOUS | Status: DC | PRN

## 2017-08-01 MED ORDER — DOCUSATE SODIUM 50 MG/5ML PO LIQD
100.0000 mg | Freq: Two times a day (BID) | ORAL | Status: DC | PRN
  Administered 2017-08-02 – 2017-08-05 (×2): 100 mg
  Filled 2017-08-01 (×2): qty 10

## 2017-08-01 MED ORDER — SODIUM CHLORIDE 0.9 % IV SOLN
Freq: Once | INTRAVENOUS | Status: AC
  Administered 2017-08-01: 11:00:00 via INTRAVENOUS

## 2017-08-01 MED ORDER — FENTANYL 2500MCG IN NS 250ML (10MCG/ML) PREMIX INFUSION
25.0000 ug/h | INTRAVENOUS | Status: DC
  Administered 2017-08-01: 250 ug/h via INTRAVENOUS
  Administered 2017-08-02: 75 ug/h via INTRAVENOUS
  Administered 2017-08-03: 150 ug/h via INTRAVENOUS
  Administered 2017-08-03: 100 ug/h via INTRAVENOUS
  Administered 2017-08-05: 75 ug/h via INTRAVENOUS
  Administered 2017-08-05: 100 ug/h via INTRAVENOUS
  Administered 2017-08-06: 175 ug/h via INTRAVENOUS
  Administered 2017-08-06: 100 ug/h via INTRAVENOUS
  Administered 2017-08-07: 25 ug/h via INTRAVENOUS
  Administered 2017-08-08: 50 ug/h via INTRAVENOUS
  Filled 2017-08-01 (×11): qty 250

## 2017-08-01 MED ORDER — ACETAMINOPHEN 650 MG RE SUPP: 650.0000 mg | RECTAL | Status: DC | PRN

## 2017-08-01 MED ORDER — HALOPERIDOL LACTATE 5 MG/ML IJ SOLN
1.0000 mg | Freq: Four times a day (QID) | INTRAMUSCULAR | Status: DC
  Administered 2017-08-01 (×3): 1 mg via INTRAVENOUS
  Filled 2017-08-01 (×3): qty 1

## 2017-08-01 MED ORDER — ACETAMINOPHEN 650 MG RE SUPP
650.0000 mg | RECTAL | Status: DC | PRN
  Administered 2017-08-01: 650 mg via RECTAL
  Filled 2017-08-01: qty 1

## 2017-08-01 MED ORDER — FENTANYL BOLUS VIA INFUSION
25.0000 ug | INTRAVENOUS | Status: DC | PRN
  Filled 2017-08-01: qty 25

## 2017-08-01 MED ORDER — SODIUM CHLORIDE 0.9 % IV BOLUS (SEPSIS)
1000.0000 mL | Freq: Once | INTRAVENOUS | Status: AC
  Administered 2017-08-01: 1000 mL via INTRAVENOUS

## 2017-08-01 MED ORDER — VANCOMYCIN HCL IN DEXTROSE 1-5 GM/200ML-% IV SOLN
1000.0000 mg | Freq: Two times a day (BID) | INTRAVENOUS | Status: DC
  Administered 2017-08-02 – 2017-08-03 (×3): 1000 mg via INTRAVENOUS
  Filled 2017-08-01 (×5): qty 200

## 2017-08-01 MED ORDER — LEVALBUTEROL HCL 0.63 MG/3ML IN NEBU
0.6300 mg | INHALATION_SOLUTION | Freq: Four times a day (QID) | RESPIRATORY_TRACT | Status: DC
  Administered 2017-08-01 – 2017-08-11 (×41): 0.63 mg via RESPIRATORY_TRACT
  Filled 2017-08-01 (×41): qty 3

## 2017-08-01 MED ORDER — NOREPINEPHRINE BITARTRATE 1 MG/ML IV SOLN: 0.0000 ug/min | INTRAVENOUS | Status: DC

## 2017-08-01 MED ORDER — NOREPINEPHRINE BITARTRATE 1 MG/ML IV SOLN
0.0000 ug/min | INTRAVENOUS | Status: DC
  Administered 2017-08-01: 2 ug/min via INTRAVENOUS
  Administered 2017-08-02: 22 ug/min via INTRAVENOUS
  Administered 2017-08-02: 3 ug/min via INTRAVENOUS
  Filled 2017-08-01 (×4): qty 4

## 2017-08-01 MED ORDER — MIDAZOLAM HCL 2 MG/2ML IJ SOLN
1.0000 mg | INTRAMUSCULAR | Status: DC | PRN
  Administered 2017-08-01 – 2017-08-04 (×10): 1 mg via INTRAVENOUS
  Filled 2017-08-01 (×7): qty 2

## 2017-08-01 MED ORDER — DEXTROSE 5 % IV SOLN
1.0000 g | Freq: Three times a day (TID) | INTRAVENOUS | Status: DC
  Administered 2017-08-01 – 2017-08-03 (×6): 1 g via INTRAVENOUS
  Filled 2017-08-01 (×7): qty 1

## 2017-08-01 MED ORDER — SUCCINYLCHOLINE CHLORIDE 20 MG/ML IJ SOLN
INTRAMUSCULAR | Status: DC | PRN
  Administered 2017-08-01: 120 mg via INTRAVENOUS

## 2017-08-01 MED ORDER — FENTANYL CITRATE (PF) 100 MCG/2ML IJ SOLN: 50.0000 ug | Freq: Once | INTRAMUSCULAR | Status: DC

## 2017-08-01 MED ORDER — INSULIN ASPART 100 UNIT/ML ~~LOC~~ SOLN
2.0000 [IU] | SUBCUTANEOUS | Status: DC
  Administered 2017-08-02: 6 [IU] via SUBCUTANEOUS
  Administered 2017-08-02: 2 [IU] via SUBCUTANEOUS
  Administered 2017-08-02: 4 [IU] via SUBCUTANEOUS
  Administered 2017-08-02 (×2): 2 [IU] via SUBCUTANEOUS
  Administered 2017-08-03: 4 [IU] via SUBCUTANEOUS
  Administered 2017-08-03: 2 [IU] via SUBCUTANEOUS
  Administered 2017-08-03 (×2): 4 [IU] via SUBCUTANEOUS
  Administered 2017-08-03: 2 [IU] via SUBCUTANEOUS
  Administered 2017-08-03: 4 [IU] via SUBCUTANEOUS
  Administered 2017-08-04 (×2): 2 [IU] via SUBCUTANEOUS
  Administered 2017-08-04 (×2): 4 [IU] via SUBCUTANEOUS
  Administered 2017-08-04 – 2017-08-05 (×5): 2 [IU] via SUBCUTANEOUS
  Administered 2017-08-06 (×5): 4 [IU] via SUBCUTANEOUS
  Administered 2017-08-06 – 2017-08-10 (×14): 2 [IU] via SUBCUTANEOUS
  Administered 2017-08-10: 4 [IU] via SUBCUTANEOUS
  Administered 2017-08-10 (×3): 2 [IU] via SUBCUTANEOUS
  Administered 2017-08-11 (×4): 4 [IU] via SUBCUTANEOUS
  Administered 2017-08-11 (×2): 2 [IU] via SUBCUTANEOUS
  Administered 2017-08-12 (×2): 4 [IU] via SUBCUTANEOUS
  Administered 2017-08-12 – 2017-08-13 (×4): 2 [IU] via SUBCUTANEOUS
  Administered 2017-08-13: 4 [IU] via SUBCUTANEOUS

## 2017-08-01 MED ORDER — SODIUM CHLORIDE 0.9 % IV BOLUS (SEPSIS)
1000.0000 mL | Freq: Once | INTRAVENOUS | Status: AC
  Administered 2017-08-02: 1000 mL via INTRAVENOUS

## 2017-08-01 MED ORDER — MIDAZOLAM HCL 2 MG/2ML IJ SOLN
1.0000 mg | INTRAMUSCULAR | Status: DC | PRN
  Filled 2017-08-01 (×3): qty 2

## 2017-08-01 MED ORDER — FUROSEMIDE 10 MG/ML IJ SOLN: 20.0000 mg | Freq: Once | INTRAMUSCULAR | Status: DC

## 2017-08-01 MED ORDER — ETOMIDATE 2 MG/ML IV SOLN
INTRAVENOUS | Status: DC | PRN
  Administered 2017-08-01: 12 mg via INTRAVENOUS

## 2017-08-01 MED ORDER — SODIUM CHLORIDE 0.9 % IV SOLN
1500.0000 mg | Freq: Once | INTRAVENOUS | Status: AC
  Administered 2017-08-01: 1500 mg via INTRAVENOUS
  Filled 2017-08-01: qty 1500

## 2017-08-01 MED ORDER — KETOROLAC TROMETHAMINE 15 MG/ML IJ SOLN
15.0000 mg | Freq: Four times a day (QID) | INTRAMUSCULAR | Status: DC
Start: 2017-08-01 — End: 2017-08-02
  Administered 2017-08-01 (×3): 15 mg via INTRAVENOUS
  Filled 2017-08-01 (×3): qty 1

## 2017-08-01 NOTE — Transfer of Care (Signed)
Immediate Anesthesia Transfer of Care Note  Patient: Kathleen Guerrero  Procedure(s) Performed: * No procedures listed *  Patient Location: ICU  Anesthesia Type:General  Level of Consciousness: Patient remains intubated per anesthesia plan  Airway & Oxygen Therapy: Patient remains intubated per anesthesia plan and Patient placed on Ventilator (see vital sign flow sheet for setting)  Post-op Assessment: Report given to RN  Post vital signs: Reviewed and stable  Last Vitals:  Vitals:   08/01/17 1900 08/01/17 1951  BP: (!) 152/82   Pulse: (!) 116   Resp: 17   Temp:    SpO2: 98% 100%    Last Pain:  Vitals:   08/01/17 1600  TempSrc: Axillary  PainSc:       Patients Stated Pain Goal: 3 (79/72/82 0601)  Complications: No apparent anesthesia complications

## 2017-08-01 NOTE — Anesthesia Procedure Notes (Signed)
Procedure Name: Intubation Date/Time: 08/01/2017 10:06 PM Performed by: Maude Leriche D Pre-anesthesia Checklist: Suction available, Emergency Drugs available, Patient identified, Patient being monitored and Timeout performed Patient Re-evaluated:Patient Re-evaluated prior to induction Oxygen Delivery Method: Ambu bag Preoxygenation: Pre-oxygenation with 100% oxygen Induction Type: IV induction Ventilation: Two handed mask ventilation required Laryngoscope Size: Glidescope Grade View: Grade I Tube type: Subglottic suction tube Tube size: 7.5 mm Number of attempts: 1 Airway Equipment and Method: Stylet and Video-laryngoscopy Placement Confirmation: ETT inserted through vocal cords under direct vision,  breath sounds checked- equal and bilateral and CO2 detector Secured at: 22 cm Tube secured with: Tape Dental Injury: Teeth and Oropharynx as per pre-operative assessment

## 2017-08-01 NOTE — Progress Notes (Signed)
Dr. Roxan Hockey paged. Updated of pt condition. Informed that pt developed a fever of 101.2. Clarified blood transfusion with new elevated temperature. Per MD hold blood transfusion at this time. New orders received.

## 2017-08-01 NOTE — Procedures (Signed)
Arterial Catheter Insertion Procedure Note Kathleen Guerrero [id number] 1946/02/03  Procedure: Insertion of Arterial Catheter  Indications: Blood pressure monitoring and Frequent blood sampling  Procedure Details Consent: Risks of procedure as well as the alternatives and risks of each were explained to the (patient/caregiver).  Consent for procedure obtained. Time Out: Verified patient identification, verified procedure, site/side was marked, verified correct patient position, special equipment/implants available, medications/allergies/relevent history reviewed, required imaging and test results available.  Performed  Maximum sterile technique was used including antiseptics, cap, gloves, gown, hand hygiene, mask and sheet. Skin prep: Chlorhexidine; local anesthetic administered 20 gauge catheter was inserted into right radial artery using the Seldinger technique.  Evaluation Blood flow good; BP tracing good. Complications: No apparent complications.  Patient doppler positive for collateral circulation prior to procedure.  Patient tolerated well.  Normal saline flush used, line leveled and zeroed, dressed per RT protocol.  Lacretia Nicks 08/01/2017

## 2017-08-01 NOTE — Progress Notes (Signed)
Pharmacy Antibiotic Note  Kathleen Guerrero is a 71 y.o. female admitted on 07/30/2017 with right upper lobe nodule now s/p VATS.  Pharmacy has been consulted for vancomycin and fortaz dosing empirically for fever.  Febrile this afternoon at 101, afebrile this am. Wbc is trending down from admit 20.9 to 10.8 this morning. Bmet this am was within normal limits. Patient received cefuroxime pre and post-op. New orders to add broad spectrum antibiotics.   Plan: Vancomycin 1500mg  IV x1 then 1000mg   IV every 12 hours.  Goal trough 15-20 mcg/mL. Fortaz 1g q8 hours  Follow bmet periodically  Check Vancomycin trough as indicated Follow up culture results  Height: 5' 2.75" (159.4 cm) Weight: 189 lb (85.7 kg) IBW/kg (Calculated) : 51.83  Temp (24hrs), Avg:99.2 F (37.3 C), Min:98.5 F (36.9 C), Max:101.2 F (38.4 C)   Recent Labs Lab 07/28/17 1051 07/30/17 1404 07/31/17 0545 08/01/17 0320  WBC 6.8 20.9* 12.5* 10.8*  CREATININE 0.77  --  0.81 0.68    Estimated Creatinine Clearance: 68.5 mL/min (by C-G formula based on SCr of 0.68 mg/dL).    No Known Allergies  Antimicrobials this admission: Vanc 9/28>> Tressie Ellis 9/28>>  Microbiology results: 9/28 BCx: sent   Thank you for allowing pharmacy to be a part of this patient's care.  Erin Hearing PharmD., BCPS Clinical Pharmacist Pager [phone] 08/01/2017 12:26 PM

## 2017-08-01 NOTE — Progress Notes (Signed)
2 Days Post-Op Procedure(s) (LRB): VIDEO ASSISTED THORACOSCOPY (VATS)/WEDGE RESECTION (Right) LOBECTOMY (Right) Subjective: Sedated currently. Per RN when sedation lightened she gives short answers and it is hard to tell if she is following commands.   Objective: Vital signs in last 24 hours: Temp:  [98.5 F (36.9 C)-98.9 F (37.2 C)] 98.7 F (37.1 C) (09/28 0400) Pulse Rate:  [73-93] 79 (09/28 0700) Cardiac Rhythm: Normal sinus rhythm (09/28 0400) Resp:  [13-24] 15 (09/28 0700) BP: (94-136)/(51-109) 96/54 (09/28 0700) SpO2:  [91 %-99 %] 91 % (09/28 0700) Weight:  [189 lb (85.7 kg)] 189 lb (85.7 kg) (09/27 2000)  Hemodynamic parameters for last 24 hours:    Intake/Output from previous day: 09/27 0701 - 09/28 0700 In: 2398.7 [I.V.:2198.7; IV Piggyback:200] Out: 7867 [EHMCN:4709; Chest Tube:840] Intake/Output this shift: No intake/output data recorded.  General appearance: delirious Neurologic: no focal deficit Heart: regular rate and rhythm Lungs: rhonchi bilaterally Abdomen: normal findings: soft, non-tender Wound: intact, extensive ecchymosis around incision small air leak, serosaguinous drainage from tube  Lab Results:  Recent Labs  07/31/17 0545 08/01/17 0320  WBC 12.5* 10.8*  HGB 8.5* 7.2*  HCT 25.6* 22.1*  PLT 165 143*   BMET:  Recent Labs  07/31/17 0545 08/01/17 0320  NA 140 139  K 3.8 3.9  CL 107 109  CO2 25 24  GLUCOSE 158* 115*  BUN 12 11  CREATININE 0.81 0.68  CALCIUM 7.8* 7.5*    PT/INR: No results for input(s): LABPROT, INR in the last 72 hours. ABG    Component Value Date/Time   PHART 7.473 (H) 07/31/2017 0410   HCO3 23.7 07/31/2017 0410   TCO2 17 (L) 07/30/2017 1739   ACIDBASEDEF 9.0 (H) 07/30/2017 1739   O2SAT 98.2 07/31/2017 0410   CBG (last 3)   Recent Labs  07/30/17 1737  GLUCAP 180*    Assessment/Plan: S/P Procedure(s) (LRB): VIDEO ASSISTED THORACOSCOPY (VATS)/WEDGE RESECTION (Right) LOBECTOMY (Right) -  NEURO-  delirium persists but agitation may be a little better  Will continue to try to wean precedex  Will give low dose Haldol  CV- stable  RESP_ increasing atelectasis at right hilar area- will NTS until able to cough and participate in pulmonary hygiene  Change nebs to Q6 scheduled  RENAL- creatinine and lytes OK  ENDO- CBG mildly elevated  HEME- anemia- Hct down to 22- no evident bleeding, likely reequilibration  Transfuse 1 unit PRBC  SCD + enoxaparin   LOS: 2 days    [name] 08/01/2017

## 2017-08-01 NOTE — Consult Note (Signed)
..   Name: Kathleen Guerrero MRN: [id number] DOB: 06/11/1946    ADMISSION DATE:  07/30/2017 CONSULTATION DATE:  08/01/17  REFERRING MD :  Roxan Hockey MD  CHIEF COMPLAINT:    BRIEF PATIENT DESCRIPTION: 71 yr old female with PMHx GERD, hiatal hernia, 50 pack yr smoker, HTN, HLD, CVA presented for RUL GGO on CT of the chest 13x9x39mm in size which showed increase in size to 13x20 mm. POD 2 s/p Right sided VATS with RUL lobectomy. Developed acute respiratory distress at 10:45 PM accompanied with new crepitus over right chest wall. S/p intubated and now on PRVC.   SIGNIFICANT EVENTS  POD 2 Right sided VATS s/p RUL Lobectomy.  Acute hypoxic respiratory failure with new subcutaneous emphysema  STUDIES:  PFTs - Pulmonary function tests November 2017 FVC 3.19 (100%) FEV1 2.36 (97%) CT Chest CXR  HISTORY OF PRESENT ILLNESS:  71 yr old female with PMHx GERD, hiatal hernia, 50 pack yr smoker, HTN, HLD, CVA presented for h/o RUL GGO on CT of the chest 13x9x47mm in size which increased in size to 13x20 mm.  At her baseline she is able to ambulate without difficulty and denied any symptoms of hemoptysis wheezing or chest pain on initial presentation. She also denied constitutional symptoms ( weight loss,fever, or change in appetite). Post Op per nurse and CT Surgery pt was increasingly altered required Haldol Ativan and eventually Precedex ggt.   Now on POD 2 s/p Right sided VATS with RUL lobectomy. Earlier patient developed a fever. Previously scheduled transfusion was held due to fever. Later this evening  pt developed acute respiratory distress at 10:45 PM desaturating tachycardic and with new crepitus over right chest wall extending into the neck. Emergent intubation indicated. On my initial exam pt not alert and not responsive. Grade 1 view. Chords visualized using Glideoscope  pt was intubated with 7.5 ETT in at 22 Lip. continues on PRVC.Became hypotensive post intubation requiring IVF bolus and started  on Levophed MAP goal>75mmHg.   PAST MEDICAL HISTORY :   has a past medical history of Arthritis; GERD (gastroesophageal reflux disease); Headache; Hepatitis; History of hiatal hernia; History of loop recorder; Hypertension; Stroke [facility]) (07/2016); and Tobacco use.  has a past surgical history that includes Cardiac catheterization (N/A, 07/23/2016); TEE without cardioversion (N/A, 07/23/2016); Dermoid cyst  excision; Intestinal obstruction (1979); Appendectomy; Video assisted thoracoscopy (vats)/wedge resection (Right, 07/30/2017); and Lobectomy (Right, 07/30/2017). Prior to Admission medications   Medication Sig Start Date End Date Taking? Authorizing Provider  amLODipine (NORVASC) 10 MG tablet TAKE 1 TABLET BY MOUTH EVERY DAY Patient taking differently: TAKE 10 MG BY MOUTH EVERY DAY 06/19/17  Yes Golden Circle, FNP  aspirin 325 MG tablet TAKE 1 TABLET BY MOUTH EVERY DAY 10/24/16  Yes Golden Circle, FNP  atorvastatin (LIPITOR) 40 MG tablet TAKE 1 TABLET BY MOUTH EVERY DAY AT 6 p.m. 04/21/17  Yes Golden Circle, FNP  calcium carbonate (TUMS - DOSED IN MG ELEMENTAL CALCIUM) 500 MG chewable tablet Chew 1 tablet by mouth 4 (four) times daily as needed for indigestion or heartburn.   Yes [provider]  diphenhydrAMINE (BENADRYL) 25 MG tablet Take 50 mg by mouth at bedtime.    Yes [provider]  metoprolol (LOPRESSOR) 50 MG tablet Take 1 tablet (50 mg total) by mouth 2 (two) times daily. 01/15/17  Yes Biagio Borg, MD  nicotine polacrilex ([employer] NICOTINE) 2 MG lozenge Take 1 mg by mouth as needed for smoking cessation (cuts lozenges in  half).    Yes [provider]  acetaminophen (TYLENOL) 325 MG tablet Take 650 mg by mouth daily as needed for mild pain or headache.    [provider]   No Known Allergies  FAMILY HISTORY:  family history includes Cancer in her maternal grandfather; Kidney disease in her maternal grandmother. SOCIAL HISTORY:  reports that she  quit smoking about a year ago. Her smoking use included Cigarettes. She has a 50.00 pack-year smoking history. She has never used smokeless tobacco. She reports that she drinks alcohol. She reports that she does not use drugs.  REVIEW OF SYSTEMS:  Unable to obtain due to medical condition: intubated and sedatied Constitutional: Negative for fever, chills, weight loss, malaise/fatigue and diaphoresis.  HENT: Negative for hearing loss, ear pain, nosebleeds, congestion, sore throat, neck pain, tinnitus and ear discharge.   Eyes: Negative for blurred vision, double vision, photophobia, pain, discharge and redness.  Respiratory: Negative for cough, hemoptysis, sputum production, shortness of breath, wheezing and stridor.   Cardiovascular: Negative for chest pain, palpitations, orthopnea, claudication, leg swelling and PND.  Gastrointestinal: Negative for heartburn, nausea, vomiting, abdominal pain, diarrhea, constipation, blood in stool and melena.  Genitourinary: Negative for dysuria, urgency, frequency, hematuria and flank pain.  Musculoskeletal: Negative for myalgias, back pain, joint pain and falls.  Skin: Negative for itching and rash.  Neurological: Negative for dizziness, tingling, tremors, sensory change, speech change, focal weakness, seizures, loss of consciousness, weakness and headaches.  Endo/Heme/Allergies: Negative for environmental allergies and polydipsia. Does not bruise/bleed easily.  SUBJECTIVE:   VITAL SIGNS: Temp:  [98.5 F (36.9 C)-101.2 F (38.4 C)] 99.4 F (37.4 C) (09/28 1600) Pulse Rate:  [73-116] 116 (09/28 1900) Resp:  [15-26] 17 (09/28 1900) BP: (96-152)/(53-109) 152/82 (09/28 1900) SpO2:  [91 %-100 %] 100 % (09/28 1951)  PHYSICAL EXAMINATION: General:  Sedated in no acute distress Neuro: GCS 9 no focal deficits HEENT: normocephalic atraumatic 7.5 ETT at 22 at lip Cardiovascular: tachycardic regular rhythm Lungs: decreased breath sounds on right in comparison  to left. Right chest tube to suction + crepitus over right lateral chest wall Abdomen:  Soft non-tender + BS Musculoskeletal: no atrophy or deformity noted Skin:  Dry and intact no rashes  Recent Labs Lab 07/28/17 1051  07/30/17 1158 07/31/17 0545 08/01/17 0320  NA 137  < > 140 140 139  K 3.8  < > 4.4 3.8 3.9  CL 106  --   --  107 109  CO2 22  --   --  25 24  BUN 11  --   --  12 11  CREATININE 0.77  --   --  0.81 0.68  GLUCOSE 106*  --   --  158* 115*  < > = values in this interval not displayed.  Recent Labs Lab 07/30/17 1404 07/31/17 0545 08/01/17 0320  HGB 9.5* 8.5* 7.2*  HCT 29.3* 25.6* 22.1*  WBC 20.9* 12.5* 10.8*  PLT 181 165 143*   Dg Chest Port 1 View  Result Date: 08/01/2017 CLINICAL DATA:  Chest tube.  Prior lung surgery. EXAM: PORTABLE CHEST 1 VIEW COMPARISON:  07/31/2017 . FINDINGS: Right chest tube and right IJ line stable position. Stable small right apical pneumothorax . Postsurgical changes right chest again noted. Cardiac monitoring device again noted. Cardiomegaly with normal pulmonary vascularity. No pleural effusion. Right chest wall subcutaneous emphysema again noted IMPRESSION: 1. Right chest tube and right IJ line stable position. Stable small right apical pneumothorax. 2. Postsurgical changes right lung  again noted.  Interim change. 3. Stable cardiomegaly . Electronically Signed   By: Marcello Moores  Register   On: 08/01/2017 08:01   Dg Chest Port 1 View  Result Date: 07/31/2017 CLINICAL DATA:  Status post right lobectomy and wedge resection yesterday. EXAM: PORTABLE CHEST 1 VIEW COMPARISON:  Portable chest x-ray of July 30, 2017 FINDINGS: The right apical pneumothorax persists. The pleural line lies between the third and fourth posterior rib interspace. The right chest tube projects over the posterior aspect of the right fifth rib. There is no pleural effusion. There is mild shift of mediastinum toward the right which is stable. The left lung is clear. The  heart is top-normal in size. There is stable prominence of the right hilar structures. There is calcification in the wall of the aortic arch. IMPRESSION: Persistent approximately 10-15% right apical pneumothorax. The right chest tube projects over the posterior aspect of the fifth rib and is slightly more inferiorly positioned today. Stable mediastinal shift toward the right. Stable right hilar prominence. Electronically Signed   By: David  Martinique M.D.   On: 07/31/2017 07:59    MOST RECENT CXR POST INTUBATION IMPRESSION: 1. Extensive soft tissue emphysema throughout the right chest wall extending in the lower neck partially obscures underlying structures. 2. Endotracheal tube tip projects over the lower trachea, carina is not visualized. 3. Stable position of right-sided chest tube, interval decrease of right pneumothorax.  ASSESSMENT / PLAN:  NEURO: Sedated Altered mental status post VATS She required Haldol, Ativan and precedex GCS 3 on my initial exam prior to intubation Now GCS 9 was noted to be altered post Op- and required Haldol and Ativan Was on Precedex prior to intubation Now intubated will switch to continuous fentanyl with prn versed ICU sedation protocol placed RASS goal -1 Hold Haldol, Ativan  Discontinued bupivacaine Morphine prn for pain- held pt on cont. Fentanyl    CARDIAC: Hypotension  Pt had hypotension post op in PACU and received Neo and Albumin BP dropped acutely in the setting of Intubation  positive pressure decreasing venous return  Anesthetic depth: Level 3 medium plane Pt received etomidate 20, succinylcholine.  Was on precedex ggt - discontinued Was on metorpolol for HTN- held.   Septic Shock IVF started- 30cc/kg bolus already on empiric abx A-line to be inserted Levophed ggt started MAP goal >34mmHg ECHO March 2018 EF 60% F/u lactic and Cortisol level    PULMONARY: Acute hypoxic respiratory failure Difficult intubation- has a prior h/o  OSA Intubated by Anesthesia with 7.5 ETT at 22 at lip Has a lac on  Right posterior pharynx. CXR reviewed demonstrates ETT Mechanical ventilation PRVC 6cc/kg TV- overbreathing vent at 32 RR Reviewed ABG.  Reviewed CXR Trachea deviated to the right Subcutaneous emphysema noted on Right chest wall and extending into neck Right chest tube to suction @-30 mgmt per CT Surgery S/p Right sided VATS with wedge resection - POD 2 well differentiated AdenoCA Stage Ia- pathology of 2 cm RUL mass   ID: Sepsis Had a fever earlier today Blood cx sent Ordered tracheal cx UA negative on 9/24 Pt continues on empiric antibiotic Vancomycin q12 and Ceftazidime q8 Recommend Vanco trough to adjust dosing   Endocrine: No h/o insulin dependence BG range from 27-28 th -> 150 to 115 ICU Glycemic protocol  Desired range BG 140-180 mg/dl Pt currently NPO  GI: NPO Insert OG Tube CXR to follow Will assess status in AM with nutrition PPI for prophylaxis   Heme: Pt has no obvious  signs of active bleeding  Hgb trend 13.6->10.2->9.5->8.5->7.2  Per OR report 900cc Blood loss- received 2 units PRBCs in OR Transfusion was ordered earlier but due to patient's temp it was held Will administer 1 unit now Check CBC in AM post transfusion No cardiac history If Hgb <7 transfuse PRBCs or per CT Surgery's orders SCDs for VTE ppx  RENAL Indwelling foley catheter in place Monitor Is and Os Discontinued toradol  Lab Results  Component Value Date   CREATININE 0.68 08/01/2017   CREATININE 0.81 07/31/2017   CREATININE 0.77 07/28/2017  Corrected Calcium 8.6 Electrolyte replacement as needed  DISPOSITION: continue in ICU 2H Mgmt per CT Surgery with CCM consulted for overall critical care CC Time: 45 mins Prognosis Guarded Family: Husband and sister at bedside- I discussed her current clinical condition and plan of care with them and included RN, and RT    I, Dr Seward Carol have personally  reviewed patient's available data, including medical history, events of note, physical examination and test results as part of my evaluation.  The patient is critically ill with multiple organ systems failure and requires high complexity decision making for assessment and support, frequent evaluation and titration of therapies, application of advanced monitoring technologies and extensive interpretation of multiple databases.  Critical Care Time devoted to patient care services described in this note is 45 Minutes. This time reflects time of care of this signee Dr Seward Carol. This critical care time does not reflect procedure time, or teaching time or supervisory time but could involve care discussion time    Signed Dr Seward Carol Pulmonary Critical Care Locums Pulmonary and [place] Pager: [phone]  08/01/2017, 10:25 PM

## 2017-08-01 NOTE — Progress Notes (Addendum)
Patient ID: Kathleen Guerrero, female   DOB: 27-May-1946, 71 y.o.   MRN: [id number] EVENING ROUNDS NOTE :     [address]       [place] [address]             [phone]                 2 Days Post-Op Procedure(s) (LRB): VIDEO ASSISTED THORACOSCOPY (VATS)/WEDGE RESECTION (Right) LOBECTOMY (Right)  Total Length of Stay:  LOS: 2 days  BP (!) 152/82   Pulse (!) 116   Temp 99.4 F (37.4 C) (Axillary)   Resp 17   Ht 5' 2.75" (1.594 m)   Wt 189 lb (85.7 kg)   SpO2 100%   BMI 33.75 kg/m   .Intake/Output      09/28 0701 - 09/29 0700   I.V. (mL/kg) 849.1 (9.9)   IV Piggyback 550   Total Intake(mL/kg) 1399.1 (16.3)   Urine (mL/kg/hr) 1600 (1.2)   Chest Tube 280   Total Output 1880   Net -480.9         . 0.45 % NaCl with KCl 20 mEq / L 100 mL/hr at 08/01/17 1858  . bupivacaine 0.5 % ON-Q pump SINGLE CATH 400 mL    . bupivacaine ON-Q pain pump    . cefTAZidime (FORTAZ)  IV Stopped (08/01/17 2130)  . dexmedetomidine 0.8 mcg/kg/hr (08/01/17 1857)  . phenylephrine (NEO-SYNEPHRINE) Adult infusion Stopped (07/31/17 0800)  . potassium chloride    . [START ON 08/02/2017] vancomycin       Lab Results  Component Value Date   WBC 10.8 (H) 08/01/2017   HGB 7.2 (L) 08/01/2017   HCT 22.1 (L) 08/01/2017   PLT 143 (L) 08/01/2017   GLUCOSE 115 (H) 08/01/2017   CHOL 93 01/17/2017   TRIG 113 01/17/2017   HDL 40 (L) 01/17/2017   LDLCALC 30 01/17/2017   ALT 15 08/01/2017   AST 21 08/01/2017   NA 139 08/01/2017   K 3.9 08/01/2017   CL 109 08/01/2017   CREATININE 0.68 08/01/2017   BUN 11 08/01/2017   CO2 24 08/01/2017   INR 0.99 07/28/2017   HGBA1C 5.4 01/17/2017   On evening rounds found patient being bagged and nurses noted sub q air  Urgent intubation for respiratory distress cultures done today when was febrile now on fortaz and vanco Ccm consulted    Xray with small apical ptx and extensive sub q air - increase suction on chest tube   Grace Isaac  MD  [place] Office [phone] 08/01/2017 10:38 PM

## 2017-08-02 ENCOUNTER — Inpatient Hospital Stay (HOSPITAL_COMMUNITY): Payer: Medicare HMO

## 2017-08-02 DIAGNOSIS — R6521 Severe sepsis with septic shock: Secondary | ICD-10-CM

## 2017-08-02 DIAGNOSIS — A419 Sepsis, unspecified organism: Secondary | ICD-10-CM

## 2017-08-02 DIAGNOSIS — J189 Pneumonia, unspecified organism: Secondary | ICD-10-CM

## 2017-08-02 DIAGNOSIS — Z902 Acquired absence of lung [part of]: Secondary | ICD-10-CM

## 2017-08-02 LAB — COMPREHENSIVE METABOLIC PANEL
ALT: 16 U/L (ref 14–54)
AST: 24 U/L (ref 15–41)
Albumin: 2.6 g/dL — ABNORMAL LOW (ref 3.5–5.0)
Alkaline Phosphatase: 41 U/L (ref 38–126)
Anion gap: 4 — ABNORMAL LOW (ref 5–15)
BUN: 8 mg/dL (ref 6–20)
CO2: 23 mmol/L (ref 22–32)
Calcium: 6.6 mg/dL — ABNORMAL LOW (ref 8.9–10.3)
Chloride: 111 mmol/L (ref 101–111)
Creatinine, Ser: 0.77 mg/dL (ref 0.44–1.00)
GFR calc Af Amer: >60 mL/min (ref >60)
GFR calc non Af Amer: >60 mL/min (ref >60)
Glucose, Bld: 134 mg/dL — ABNORMAL HIGH (ref 65–99)
Potassium: 4.2 mmol/L (ref 3.5–5.1)
Sodium: 138 mmol/L (ref 135–145)
Total Bilirubin: 0.9 mg/dL (ref 0.3–1.2)
Total Protein: 5.3 g/dL — ABNORMAL LOW (ref 6.5–8.1)

## 2017-08-02 LAB — GLUCOSE, CAPILLARY
GLUCOSE-CAPILLARY: 121 mg/dL — AB (ref 65–99)
GLUCOSE-CAPILLARY: 140 mg/dL — AB (ref 65–99)
GLUCOSE-CAPILLARY: 157 mg/dL — AB (ref 65–99)
Glucose-Capillary: 149 mg/dL — ABNORMAL HIGH (ref 65–99)
Glucose-Capillary: 90 mg/dL (ref 65–99)
Glucose-Capillary: 214 mg/dL — ABNORMAL HIGH (ref 65–99)
Glucose-Capillary: 161 mg/dL — ABNORMAL HIGH (ref 65–99)

## 2017-08-02 LAB — CBC
HCT: 26.8 % — ABNORMAL LOW (ref 36.0–46.0)
Hemoglobin: 8.4 g/dL — ABNORMAL LOW (ref 12.0–15.0)
MCH: 28.8 pg (ref 26.0–34.0)
MCHC: 31.3 g/dL (ref 30.0–36.0)
MCV: 91.8 fL (ref 78.0–100.0)
PLATELETS: 212 10*3/uL (ref 150–400)
RBC: 2.92 MIL/uL — ABNORMAL LOW (ref 3.87–5.11)
RDW: 14.7 % (ref 11.5–15.5)
WBC: 20.1 10*3/uL — ABNORMAL HIGH (ref 4.0–10.5)

## 2017-08-02 LAB — PROCALCITONIN: Procalcitonin: <0.1 ng/mL

## 2017-08-02 LAB — LACTIC ACID, PLASMA
LACTIC ACID, VENOUS: 0.7 mmol/L (ref 0.5–1.9)
Lactic Acid, Venous: 0.9 mmol/L (ref 0.5–1.9)

## 2017-08-02 LAB — CORTISOL: Cortisol, Plasma: 8.9 ug/dL

## 2017-08-02 MED ORDER — DEXTROSE 5 % IV SOLN
0.0000 ug/min | INTRAVENOUS | Status: DC
  Administered 2017-08-02: 22 ug/min via INTRAVENOUS
  Administered 2017-08-03: 30 ug/min via INTRAVENOUS
  Administered 2017-08-04: 35 ug/min via INTRAVENOUS
  Filled 2017-08-02 (×4): qty 16

## 2017-08-02 MED ORDER — HYDROCORTISONE NA SUCCINATE PF 100 MG IJ SOLR
50.0000 mg | Freq: Four times a day (QID) | INTRAMUSCULAR | Status: DC
  Administered 2017-08-02 – 2017-08-07 (×20): 50 mg via INTRAVENOUS
  Filled 2017-08-02 (×20): qty 2

## 2017-08-02 MED ORDER — CHLORHEXIDINE GLUCONATE 0.12% ORAL RINSE (MEDLINE KIT)
15.0000 mL | Freq: Two times a day (BID) | OROMUCOSAL | Status: DC
  Administered 2017-08-02 – 2017-08-13 (×23): 15 mL via OROMUCOSAL

## 2017-08-02 MED ORDER — ORAL CARE MOUTH RINSE
15.0000 mL | Freq: Four times a day (QID) | OROMUCOSAL | Status: DC
  Administered 2017-08-02 – 2017-08-04 (×7): 15 mL via OROMUCOSAL

## 2017-08-02 MED ORDER — PANTOPRAZOLE SODIUM 40 MG IV SOLR
40.0000 mg | Freq: Every day | INTRAVENOUS | Status: DC
  Administered 2017-08-02: 40 mg via INTRAVENOUS
  Filled 2017-08-02: qty 40

## 2017-08-02 NOTE — Progress Notes (Signed)
Initial Nutrition Assessment  DOCUMENTATION CODES:   Obesity unspecified  INTERVENTION:  If pt to remain intubated > 24 hours, recommend Vital High Protein @ 50 mL/hr. This regimen will provide 1200 kcal, 105 grams of protein, and 1003 mL free water.    NUTRITION DIAGNOSIS:   Inadequate oral intake related to inability to eat as evidenced by NPO status.  GOAL:   Provide needs based on ASPEN/SCCM guidelines  MONITOR:   Vent status, Weight trends, Labs, Skin  REASON FOR ASSESSMENT:   Ventilator  ASSESSMENT:   71 yr old female with PMHx GERD, hiatal hernia, 50 pack yr smoker, HTN, HLD, CVA presented for RUL GGO on CT of the chest 13x9x27mm in size which showed increase in size to 13x20 mm. POD 2 s/p Right sided VATS with RUL lobectomy. Developed acute respiratory distress at 10:45 PM on 9/28 accompanied with new crepitus over right chest wall. S/p intubated and now on PRVC.   Pt seen for new vent. BMI indicates obesity. Pt was intubated last night and OGT placed at that time. She is POD #2 R-sided VATS and RUL lobectomy (as outlined above). Family at bedside, including her daughter who lives out of state. Daughter states she talks to her mom on the phone often. Pt has always had a good appetite although appetite has decreased some in the past 1 year following a stroke. No difficulties with chewing or swallowing following the stroke. Pt still eats full meals and usually eats several meals a day. Daughter states "she has never been one to eat like a bird." She reports that pt had bowel obstruction in her 64s but no residual issues related to this although she states pt has had "undiagnosed GI issues" throughout her life but daughter does not feel that this has necessarily affected PO intakes or food/beverage choices.   Physical assessment shows no muscle or fat wasting. Mild edema to extremities noted. Daughter reports that BLE tend to always have slight swelling. Per chart review, weight  is +8 lbs since 6/6; will continue to monitor weight during hospitalization.   Patient is currently intubated on ventilator support MV: 7.7 L/min Temp (24hrs), Avg:99.5 F (37.5 C), Min:97.5 F (36.4 C), Max:101.2 F (38.4 C) Propofol: none BP: 111/45 and MAP: 63  Medications reviewed; 10 mg Dulcolax per OGT/day, 50 mg Solu-cortef QID, sliding scale Novolog, 1 tablet Senokot per OGT/day. Labs reviewed; CBGs: 121, 149, and 90 mg/dL this AM, Ca: 6.6 mg/dL.  IVF: NS-20 mEq KCl @ 100 mL/hr. Drips: Fentanyl @ 75 mcg/hr, Levo @ 15 mcg/min.    Diet Order:  Diet NPO time specified  Skin:  Wound (see comment) (R chest incision from 9/26)  Last BM:  PTA/unknown  Height:   Ht Readings from Last 1 Encounters:  07/31/17 5' 2.75" (1.594 m)    Weight:   Wt Readings from Last 1 Encounters:  07/31/17 189 lb (85.7 kg)    Ideal Body Weight:  51.7 kg  BMI:  Body mass index is 33.75 kg/m.  Estimated Nutritional Needs:   Kcal:  1607-3710 (12-15 kcal/kg)  Protein:  103 grams (2 grams/kg IBW)  Fluid:  >/= 1.5 L/day  EDUCATION NEEDS:   No education needs identified at this time    Jarome Matin, MS, RD, LDN, CNSC Inpatient Clinical Dietitian Pager # [phone] After hours/weekend pager # [phone]

## 2017-08-02 NOTE — Progress Notes (Signed)
Patient ID: Kathleen Guerrero, female   DOB: 1946/09/24, 71 y.o.   MRN: [id number] EVENING ROUNDS NOTE :     [address]       [place] [address]             [phone]                 3 Days Post-Op Procedure(s) (LRB): VIDEO ASSISTED THORACOSCOPY (VATS)/WEDGE RESECTION (Right) LOBECTOMY (Right)  Total Length of Stay:  LOS: 3 days  BP (!) 109/51   Pulse 97   Temp 98.3 F (36.8 C) (Oral)   Resp (!) 26   Ht 5' 2.75" (1.594 m)   Wt 189 lb (85.7 kg)   SpO2 100%   BMI 33.75 kg/m   .Intake/Output      09/29 0701 - 09/30 0700   I.V. (mL/kg) 660.5 (7.7)   IV Piggyback 250   Total Intake(mL/kg) 910.5 (10.6)   Urine (mL/kg/hr) 260 (0.2)   Emesis/NG output 400   Chest Tube 30   Total Output 690   Net +220.5         . sodium chloride    . cefTAZidime (FORTAZ)  IV Stopped (08/02/17 1356)  . fentaNYL infusion INTRAVENOUS 100 mcg/hr (08/02/17 1023)  . norepinephrine (LEVOPHED) Adult infusion    . potassium chloride    . vancomycin Stopped (08/02/17 1727)     Lab Results  Component Value Date   WBC 20.1 (H) 08/02/2017   HGB 8.4 (L) 08/02/2017   HCT 26.8 (L) 08/02/2017   PLT 212 08/02/2017   GLUCOSE 134 (H) 08/02/2017   CHOL 93 01/17/2017   TRIG 113 01/17/2017   HDL 40 (L) 01/17/2017   LDLCALC 30 01/17/2017   ALT 16 08/02/2017   AST 24 08/02/2017   NA 138 08/02/2017   K 4.2 08/02/2017   CL 111 08/02/2017   CREATININE 0.77 08/02/2017   BUN 8 08/02/2017   CO2 23 08/02/2017   INR 0.99 07/28/2017   HGBA1C 5.4 01/17/2017   BP has been up and down today Sub q air present some in arm , not around eyes Sedated  Small air leak from chest tube   Grace Isaac MD  [facility] [phone] Office [phone] 08/02/2017 8:09 PM

## 2017-08-02 NOTE — Progress Notes (Addendum)
2130 - Assessed patient, thick tenacious secretions noted. Suctioned via NT approach 2135 - Asked patient if she was in pain. Patient respond with "yes", unable to point to location of pain. Proceeded to administer morphine 1mg  IV. 2145 - Patient with sustaining HR of 150bpm. Dypneic. O2 saturation maintaining >95% on 2L nasal cannula. Charge nurse notified. 2147 - Stridor assessed. O2 saturation now 79%. New subcutaneous emphysema palpated on neck. Maramec notified. Ambubag initiated. Orders received for intubation. 2206 - Intubated. Precedex discontinued. Fentanyl gtt started. 2221 - BP 79/47, orders received to initiate levophed and insert arterial line.  2230 - BP 116/63 on 10 of levo. Fentanyl at 250.

## 2017-08-02 NOTE — Consult Note (Signed)
..   Name: Kathleen Guerrero MRN: [id number] DOB: [birth year]    ADMISSION DATE:  07/30/2017 CONSULTATION DATE:  08/01/17  REFERRING MD :  Roxan Hockey MD  CHIEF COMPLAINT:    BRIEF PATIENT DESCRIPTION: 71 yr old female with PMHx GERD, hiatal hernia, 50 pack yr smoker, HTN, HLD, CVA presented for RUL GGO on CT of the chest 13x9x53mm in size which showed increase in size to 13x20 mm. POD 2 s/p Right sided VATS with RUL lobectomy. Developed acute respiratory distress at 10:45 PM accompanied with new crepitus over right chest wall. S/p intubated and now on PRVC.   SIGNIFICANT EVENTS  9/26> Right sided VATS s/p RUL Lobectomy.  9/28 >Acute hypoxic respiratory failure with new subcutaneous emphysema>intubated /vent   STUDIES:  PFTs - Pulmonary function tests November 2017 FVC 3.19 (100%) FEV1 2.36 (97%) CT Chest 07/09/17> interval progression RUL nodule 11mm to 41mm   Cultures :  Sputum 9/28 >> [facility] 9/28 >>   HISTORY OF PRESENT ILLNESS:  71 yr old female with PMHx GERD, hiatal hernia, 50 pack yr smoker, HTN, HLD, CVA presented for h/o RUL GGO on CT of the chest 13x9x75mm in size which increased in size to 13x20 mm.  At her baseline she is able to ambulate without difficulty and denied any symptoms of hemoptysis wheezing or chest pain on initial presentation. She also denied constitutional symptoms ( weight loss,fever, or change in appetite). Post Op per nurse and CT Surgery pt was increasingly altered required Haldol Ativan and eventually Precedex ggt.   Now on POD 2 s/p Right sided VATS with RUL lobectomy. Earlier patient developed a fever. Previously scheduled transfusion was held due to fever. Later this evening  pt developed acute respiratory distress at 10:45 PM desaturating tachycardic and with new crepitus over right chest wall extending into the neck. Emergent intubation indicated. On my initial exam pt not alert and not responsive. Grade 1 view. Chords visualized using Glideoscope  pt was  intubated with 7.5 ETT in at 22 Lip. continues on PRVC.Became hypotensive post intubation requiring IVF bolus and started on Levophed MAP goal>42mmHg.   SUBJECTIVE:  Episodes of severe agitation overnight requiring restraints , improved Fent gtt  Remains on pressors with levophed at 72mcg . B/p with wide flucuations.  Cortisol 8.9  Not following commands.  Family updated at bedside.    VITAL SIGNS: Temp:  [97.5 F (36.4 C)-101.2 F (38.4 C)] 101.1 F (38.4 C) (09/29 0751) Pulse Rate:  [72-129] 106 (09/29 0900) Resp:  [12-26] 17 (09/29 0900) BP: (83-156)/(44-112) 107/48 (09/29 0900) SpO2:  [91 %-100 %] 100 % (09/29 0900) Arterial Line BP: (95-174)/(41-72) 101/44 (09/29 0900) FiO2 (%):  [60 %-100 %] 60 % (09/29 0732)  PHYSICAL EXAMINATION: General: sedated on vent  Neuro: sedated with intermittent agitation .  HEENT: ETT  Cardiovascular: ST  Lungs: decreased BS , R CT to sxn , +air leak, + crepitus on right .decreased breath sounds on right in comparison to left. Right chest tube to suction + crepitus over right lateral chest wall Abdomen:  Soft ,+BS , OG to sxn  Musculoskeletal:intact  Skin:  Dry /no rash .   Recent Labs Lab 07/31/17 0545 08/01/17 0320 08/02/17 0418  NA 140 139 138  K 3.8 3.9 4.2  CL 107 109 111  CO2 25 24 23   BUN 12 11 8   CREATININE 0.81 0.68 0.77  GLUCOSE 158* 115* 134*    Recent Labs Lab 07/31/17 0545 08/01/17 0320 08/02/17 0418  HGB 8.5*  7.2* 8.4*  HCT 25.6* 22.1* 26.8*  WBC 12.5* 10.8* 20.1*  PLT 165 143* 212   Dg Chest Port 1 View  Result Date: 08/02/2017 CLINICAL DATA:  Status post lobectomy on the right EXAM: PORTABLE CHEST 1 VIEW COMPARISON:  Yesterday FINDINGS: Stable positioning of right-sided chest tube with small right apical pneumothorax, less than 10%. Unchanged positioning of the endotracheal tube relative to the clavicular heads. The carina is not visible. Right IJ central line with tip near the upper cavoatrial junction.  There is an implantable loop recorder. New orogastric tube that reaches the stomach. Chest wall emphysema that is progressive in the left breast. Cardiomegaly and postoperative distortion of mediastinal contours. IMPRESSION: 1. Unchanged small right apical pneumothorax. Chest wall emphysema with progression into the left breast. 2. Nonvisualized carina. Unchanged positioning of endotracheal tube relative to the clavicular heads. 3. New orogastric tube which reaches the stomach. Electronically Signed   By: Monte Fantasia M.D.   On: 08/02/2017 07:48   Dg Chest Port 1 View  Result Date: 08/01/2017 CLINICAL DATA:  71 y/o F; ETT and chest tube placement. Right-sided colectomy. EXAM: PORTABLE CHEST 1 VIEW COMPARISON:  08/01/2017 chest radiograph. FINDINGS: Stable position of right-sided chest tube. Endotracheal tube tip in the lower trachea, the carina is not visualized. The trachea is deviated rightward due to volume loss in the right hemithorax. Right central venous catheter tip projects over lower SVC. Stable cardiac silhouette given projection and technique. Marked interval increase in subcutaneous emphysema throughout the right chest wall extending into the lower neck. Interval decrease in right-sided pneumothorax. No new acute osseous abnormality identified. IMPRESSION: 1. Extensive soft tissue emphysema throughout the right chest wall extending in the lower neck partially obscures underlying structures. 2. Endotracheal tube tip projects over the lower trachea, carina is not visualized. 3. Stable position of right-sided chest tube, interval decrease of right pneumothorax. Electronically Signed   By: Kristine Garbe M.D.   On: 08/01/2017 22:47   Dg Chest Port 1 View  Result Date: 08/01/2017 CLINICAL DATA:  Chest tube.  Prior lung surgery. EXAM: PORTABLE CHEST 1 VIEW COMPARISON:  07/31/2017 . FINDINGS: Right chest tube and right IJ line stable position. Stable small right apical pneumothorax .  Postsurgical changes right chest again noted. Cardiac monitoring device again noted. Cardiomegaly with normal pulmonary vascularity. No pleural effusion. Right chest wall subcutaneous emphysema again noted IMPRESSION: 1. Right chest tube and right IJ line stable position. Stable small right apical pneumothorax. 2. Postsurgical changes right lung again noted.  Interim change. 3. Stable cardiomegaly . Electronically Signed   By: Marcello Moores  Register   On: 08/01/2017 08:01   Dg Abd Portable 1v  Result Date: 08/02/2017 CLINICAL DATA:  OG tube placement. EXAM: PORTABLE ABDOMEN - 1 VIEW COMPARISON:  Chest radiograph yesterday. FINDINGS: Tip of the enteric tube in the midesophagus, advancement of at least 15 cm recommended. Gaseous gastric distention in the upper abdomen is partially included. Chest tube in place. Rather extensive subcutaneous emphysema about the chest wall, right greater than left. IMPRESSION: Tip of the enteric tube in the midesophagus. Recommend advancement of at least 15 cm to place the tip below the diaphragm. There is gaseous gastric distention. Electronically Signed   By: Jeb Levering M.D.   On: 08/02/2017 02:54     ASSESSMENT / PLAN:  NEURO: Sedated Altered mental status post VATS   Plan  D/c haldol and ativan .  Cont w/ ICU sedation with Fent  Versed As needed  CARDIAC: Hypotension -Septic Shock  Lactate /PCT nml . Echo 01/2017 EF 60%. Cortisol 8.9 9/29   Plan  Pressors w/ Levophed for MAP >65  Add stress dose steroids  Check CVP   PULMONARY: Acute hypoxic respiratory failure Difficult intubation- has a prior h/o OSA Intubated by Anesthesia with 7.5 ETT at 22 at lip Has a lac on  Right posterior pharynx. Trachea deviated to right , subcutaneous emphysema  S/p Right sided VATS with wedge resection 9/26  well differentiated AdenoCA Stage Ia- pathology of 2 cm RUL mass   Plan  Mechanical ventilation PRVC Right chest tube to suction @-30 mgmt per CT  Surgery Daily SBT  VAP  Wean as able.   ID: Sepsis  Plan  Cx pending  Cont Vanc /Fortaz   Endocrine: No h/o insulin dependence   Plan  ICU Glycemic protocol    GI: NPO OG Tube to sxn  Will assess status in AM with nutrition PPI for prophylaxis   Heme: Anemia with sig drop in Hb -Pt has no obvious signs of active bleeding  Hgb trend 13.6->10.2->9.5->8.5->7.2 >8.4 Per OR report 900cc Blood loss- received 2 units PRBCs in OR 9/26   s/p 1 u PRBC 9/28    Plan  If Hgb <7 transfuse PRBCs or per CT Surgery's orders SCDs for VTE ppx  RENAL Indwelling foley catheter in place      Lab Results  Component Value Date   CREATININE 0.77 08/02/2017   CREATININE 0.68 08/01/2017   CREATININE 0.81 07/31/2017    Plan  I/O  Electrolyte replacement as needed IVF    DISPOSITION: continue in ICU 2H Mgmt per CT Surgery with CCM consulted for overall critical care   Prognosis Guarded Family: family updated at bedside 08/02/17    ,Tammy Parrett NP-C  [facility]  [phone]   08/02/2017, 9:27 AM  Attending Note:  I have examined patient, reviewed labs, studies and notes. I have discussed the case with T Parrett, and I agree with the data and plans as amended above. 71 year old woman with probable COPD, GERD, hiatal hernia, history of stroke. She underwent VATS right upper lobectomy for a groundglass nodule that ultimately proved to be adenocarcinoma. She had a complicated postoperative course that included delirium and agitation, followed by accumulation of subcutaneous air. She was intubated 9/28. She has been febrile with evolving hypotension and treated for presumed HCAP. Stress dose steroids were added given her low random cortisol. On my evaluation she is currently well sedated, mechanically ventilated. She has coarse bilateral breath sounds. Heart is distant without a murmur. Abdomen is benign, hypoactive bowel sounds. She has trace bilateral lower  extremity edema. Suspect that her risk for decompensation was due to both agitation and evolving sepsis, probably pneumonia as the source. She continues to have subcutaneous air in absence of an air leak. Question whether she may need a second right-sided chest tube. Unclear at this time, we will need to follow. Plan to lighten sedation on 9/30, assess her for possible spontaneous breathing trial at that time. Independent critical care time is 34 minutes.   Baltazar Apo, MD, PhD 08/02/2017, 1:05 PM [facility] [phone] or if no answer [phone]

## 2017-08-02 NOTE — Progress Notes (Signed)
On beginning assessment, pt became extremely agitated with minimal interaction. BP to 200's, HR to 130's, asynchrony with vent. Levo stopped. Fent increased to 100. Family at bedside and aware.

## 2017-08-02 NOTE — Progress Notes (Signed)
Dr Vaughan Browner notified of UO 260 for shift and CVP 13. No new orders given.

## 2017-08-02 NOTE — Progress Notes (Signed)
Dilaudid PCA 20 ml wasted in trash with Arnel Melvyn Neth RN

## 2017-08-02 NOTE — Progress Notes (Addendum)
Patient ID: Kathleen Guerrero STAPLES, female   DOB: August 18, 1946, 71 y.o.   MRN: [id number] TCTS DAILY ICU PROGRESS NOTE                   [address]athleen [address]            [name]          [phone]   3 Days Post-Op Procedure(s) (LRB): VIDEO ASSISTED THORACOSCOPY (VATS)/WEDGE RESECTION (Right) LOBECTOMY (Right)  Total Length of Stay:  LOS: 3 days   Subjective: After events of yesterday , remains sedated on vent   Objective: Vital signs in last 24 hours: Temp:  [97.5 F (36.4 C)-101.2 F (38.4 C)] 101.1 F (38.4 C) (09/29 0751) Pulse Rate:  [72-129] 106 (09/29 0900) Cardiac Rhythm: Normal sinus rhythm;Sinus tachycardia (09/29 0730) Resp:  [12-24] 17 (09/29 0900) BP: (83-156)/(44-112) 107/48 (09/29 0900) SpO2:  [91 %-100 %] 100 % (09/29 0900) Arterial Line BP: (95-174)/(41-72) 101/44 (09/29 0900) FiO2 (%):  [60 %-100 %] 60 % (09/29 0732)  Filed Weights   07/31/17 2000  Weight: 189 lb (85.7 kg)    Weight change:    Hemodynamic parameters for last 24 hours:    Intake/Output from previous day: 09/28 0701 - 09/29 0700 In: 5887.8 [I.V.:2722.8; Blood:315; IV [id number]:9935] Out: 2695 [Urine:2285; Emesis/NG output:100; Chest Tube:310]  Intake/Output this shift: No intake/output data recorded.  Current Meds: Scheduled Meds: . acetaminophen  1,000 mg Oral Q6H   Or  . acetaminophen (TYLENOL) oral liquid 160 mg/5 mL  1,000 mg Oral Q6H  . aspirin  325 mg Oral Daily  . atorvastatin  40 mg Oral q1800  . bisacodyl  10 mg Oral Daily  . chlorhexidine gluconate (MEDLINE KIT)  15 mL Mouth Rinse BID  . Chlorhexidine Gluconate Cloth  6 each Topical Daily  . enoxaparin (LOVENOX) injection  40 mg Subcutaneous Q24H  . fentaNYL (SUBLIMAZE) injection  50 mcg Intravenous Once  . furosemide  20 mg Intravenous Once  . hydrocortisone sodium succinate  50 mg Intravenous Q6H  . insulin aspart  2-6 Units Subcutaneous Q4H  . levalbuterol  0.63 mg Nebulization Q6H  . mouth rinse   15 mL Mouth Rinse BID  . mouth rinse  15 mL Mouth Rinse QID  . pantoprazole (PROTONIX) IV  40 mg Intravenous Daily  . senna-docusate  1 tablet Oral QHS  . sodium chloride flush  10-40 mL Intracatheter Q12H   Continuous Infusions: . sodium chloride    . cefTAZidime (FORTAZ)  IV Stopped (08/02/17 0622)  . fentaNYL infusion INTRAVENOUS 100 mcg/hr (08/02/17 1023)  . norepinephrine (LEVOPHED) Adult infusion 15 mcg/min (08/02/17 0905)  . potassium chloride    . vancomycin Stopped (08/02/17 0319)   PRN Meds:.Place/Maintain arterial line **AND** sodium chloride, acetaminophen, calcium carbonate, docusate, fentaNYL, midazolam, midazolam, ondansetron (ZOFRAN) IV, potassium chloride, sodium chloride flush  General appearance: alert and cooperative Neurologic: intact Heart: regular rate and rhythm, S1, S2 normal, no murmur, click, rub or gallop Lungs: diminished breath sounds bilaterally Abdomen: mild distention with sub q air over chest left reater then right  Extremities: extremities normal, atraumatic, no cyanosis or edema and Homans sign is negative, no sign of DVT Wound: small air leak   Lab Results: CBC: Recent Labs  08/01/17 0320 08/02/17 0418  WBC 10.8* 20.1*  HGB 7.2* 8.4*  HCT 22.1* 26.8*  PLT 143* 212   BMET:  Recent Labs  08/01/17 0320 08/02/17 0418  NA 139 138  K 3.9 4.2  CL 109 111  CO2 24 23  GLUCOSE 115* 134*  BUN 11 8  CREATININE 0.68 0.77  CALCIUM 7.5* 6.6*    CMET: Lab Results  Component Value Date   WBC 20.1 (H) 08/02/2017   HGB 8.4 (L) 08/02/2017   HCT 26.8 (L) 08/02/2017   PLT 212 08/02/2017   GLUCOSE 134 (H) 08/02/2017   CHOL 93 01/17/2017   TRIG 113 01/17/2017   HDL 40 (L) 01/17/2017   LDLCALC 30 01/17/2017   ALT 16 08/02/2017   AST 24 08/02/2017   NA 138 08/02/2017   K 4.2 08/02/2017   CL 111 08/02/2017   CREATININE 0.77 08/02/2017   BUN 8 08/02/2017   CO2 23 08/02/2017   INR 0.99 07/28/2017   HGBA1C 5.4 01/17/2017      PT/INR:  No results for input(s): LABPROT, INR in the last 72 hours. Radiology: Dg Chest Port 1 View  Result Date: 08/02/2017 CLINICAL DATA:  Status post lobectomy on the right EXAM: PORTABLE CHEST 1 VIEW COMPARISON:  Yesterday FINDINGS: Stable positioning of right-sided chest tube with small right apical pneumothorax, less than 10%. Unchanged positioning of the endotracheal tube relative to the clavicular heads. The carina is not visible. Right IJ central line with tip near the upper cavoatrial junction. There is an implantable loop recorder. New orogastric tube that reaches the stomach. Chest wall emphysema that is progressive in the left breast. Cardiomegaly and postoperative distortion of mediastinal contours. IMPRESSION: 1. Unchanged small right apical pneumothorax. Chest wall emphysema with progression into the left breast. 2. Nonvisualized carina. Unchanged positioning of endotracheal tube relative to the clavicular heads. 3. New orogastric tube which reaches the stomach. Electronically Signed   By: Monte Fantasia M.D.   On: 08/02/2017 07:48   Dg Chest Port 1 View  Result Date: 08/01/2017 CLINICAL DATA:  71 y/o F; ETT and chest tube placement. Right-sided colectomy. EXAM: PORTABLE CHEST 1 VIEW COMPARISON:  08/01/2017 chest radiograph. FINDINGS: Stable position of right-sided chest tube. Endotracheal tube tip in the lower trachea, the carina is not visualized. The trachea is deviated rightward due to volume loss in the right hemithorax. Right central venous catheter tip projects over lower SVC. Stable cardiac silhouette given projection and technique. Marked interval increase in subcutaneous emphysema throughout the right chest wall extending into the lower neck. Interval decrease in right-sided pneumothorax. No new acute osseous abnormality identified. IMPRESSION: 1. Extensive soft tissue emphysema throughout the right chest wall extending in the lower neck partially obscures underlying structures. 2.  Endotracheal tube tip projects over the lower trachea, carina is not visualized. 3. Stable position of right-sided chest tube, interval decrease of right pneumothorax. Electronically Signed   By: Kathleen Guerrero M.D.   On: 08/01/2017 22:47   Dg Abd Portable 1v  Result Date: 08/02/2017 CLINICAL DATA:  OG tube placement. EXAM: PORTABLE ABDOMEN - 1 VIEW COMPARISON:  Chest radiograph yesterday. FINDINGS: Tip of the enteric tube in the midesophagus, advancement of at least 15 cm recommended. Gaseous gastric distention in the upper abdomen is partially included. Chest tube in place. Rather extensive subcutaneous emphysema about the chest wall, right greater than left. IMPRESSION: Tip of the enteric tube in the midesophagus. Recommend advancement of at least 15 cm to place the tip below the diaphragm. There is gaseous gastric distention. Electronically Signed   By: Jeb Levering M.D.   On: 08/02/2017 02:54     Assessment/Plan: S/P Procedure(s) (LRB): VIDEO ASSISTED THORACOSCOPY (VATS)/WEDGE RESECTION (Right) LOBECTOMY (Right) Neuro : sedated  on vent postop delirium noted first post op day Cardiac : on pressors now  Infection: cultures pending , on vanco fortaz starting yesterday , lactate 0.9 prolactin <0.10  Acute hypoxic respiatory failure  H/h 8.4/26 WBC to 20,000 Leave chest tube for now   Grace Isaac 08/02/2017 10:38 AM

## 2017-08-03 ENCOUNTER — Inpatient Hospital Stay (HOSPITAL_COMMUNITY): Payer: Medicare HMO

## 2017-08-03 ENCOUNTER — Inpatient Hospital Stay (HOSPITAL_COMMUNITY): Payer: Medicare HMO | Admitting: Certified Registered"

## 2017-08-03 DIAGNOSIS — J939 Pneumothorax, unspecified: Secondary | ICD-10-CM

## 2017-08-03 DIAGNOSIS — J9601 Acute respiratory failure with hypoxia: Secondary | ICD-10-CM

## 2017-08-03 LAB — POCT I-STAT 3, ART BLOOD GAS (G3+)
ACID-BASE DEFICIT: 3 mmol/L — AB (ref 0.0–2.0)
Acid-base deficit: 5 mmol/L — ABNORMAL HIGH (ref 0.0–2.0)
Acid-base deficit: 4 mmol/L — ABNORMAL HIGH (ref 0.0–2.0)
Bicarbonate: 23.2 mmol/L (ref 20.0–28.0)
Bicarbonate: 23.5 mmol/L (ref 20.0–28.0)
Bicarbonate: 23.7 mmol/L (ref 20.0–28.0)
O2 SAT: 88 %
O2 SAT: 97 %
O2 Saturation: 93 %
PCO2 ART: 65 mmHg — AB (ref 32.0–48.0)
PCO2 ART: 56.2 mmHg — AB (ref 32.0–48.0)
PH ART: 7.228 — AB (ref 7.350–7.450)
PO2 ART: 87 mmHg (ref 83.0–108.0)
PO2 ART: 63 mmHg — AB (ref 83.0–108.0)
PO2 ART: 105 mmHg (ref 83.0–108.0)
Patient temperature: 98.9
Patient temperature: 97.8
TCO2: 25 mmol/L (ref 22–32)
TCO2: 25 mmol/L (ref 22–32)
TCO2: 25 mmol/L (ref 22–32)
pCO2 arterial: 53.9 mmHg — ABNORMAL HIGH (ref 32.0–48.0)
pH, Arterial: 7.162 — CL (ref 7.350–7.450)
pH, Arterial: 7.253 — ABNORMAL LOW (ref 7.350–7.450)

## 2017-08-03 LAB — GLUCOSE, CAPILLARY
GLUCOSE-CAPILLARY: 155 mg/dL — AB (ref 65–99)
Glucose-Capillary: 130 mg/dL — ABNORMAL HIGH (ref 65–99)
Glucose-Capillary: 132 mg/dL — ABNORMAL HIGH (ref 65–99)
Glucose-Capillary: 161 mg/dL — ABNORMAL HIGH (ref 65–99)
Glucose-Capillary: 149 mg/dL — ABNORMAL HIGH (ref 65–99)
Glucose-Capillary: 155 mg/dL — ABNORMAL HIGH (ref 65–99)

## 2017-08-03 LAB — CBC
HEMATOCRIT: 25.9 % — AB (ref 36.0–46.0)
HEMATOCRIT: 25.1 % — AB (ref 36.0–46.0)
HEMATOCRIT: 22.2 % — AB (ref 36.0–46.0)
HEMOGLOBIN: 8.4 g/dL — AB (ref 12.0–15.0)
HEMOGLOBIN: 8 g/dL — AB (ref 12.0–15.0)
Hemoglobin: 7.1 g/dL — ABNORMAL LOW (ref 12.0–15.0)
MCH: 30.5 pg (ref 26.0–34.0)
MCH: 29.4 pg (ref 26.0–34.0)
MCH: 29.5 pg (ref 26.0–34.0)
MCHC: 32.4 g/dL (ref 30.0–36.0)
MCHC: 31.9 g/dL (ref 30.0–36.0)
MCHC: 32 g/dL (ref 30.0–36.0)
MCV: 94.2 fL (ref 78.0–100.0)
MCV: 92.3 fL (ref 78.0–100.0)
MCV: 92.1 fL (ref 78.0–100.0)
PLATELETS: 286 10*3/uL (ref 150–400)
Platelets: 250 10*3/uL (ref 150–400)
Platelets: 318 10*3/uL (ref 150–400)
RBC: 2.75 MIL/uL — ABNORMAL LOW (ref 3.87–5.11)
RBC: 2.72 MIL/uL — AB (ref 3.87–5.11)
RBC: 2.41 MIL/uL — ABNORMAL LOW (ref 3.87–5.11)
RDW: 15.9 % — ABNORMAL HIGH (ref 11.5–15.5)
RDW: 15.3 % (ref 11.5–15.5)
RDW: 15 % (ref 11.5–15.5)
WBC: 22.9 10*3/uL — AB (ref 4.0–10.5)
WBC: 24.5 10*3/uL — AB (ref 4.0–10.5)
WBC: 17.7 10*3/uL — ABNORMAL HIGH (ref 4.0–10.5)

## 2017-08-03 LAB — POCT I-STAT, CHEM 8
BUN: 20 mg/dL (ref 6–20)
CALCIUM ION: 1.08 mmol/L — AB (ref 1.15–1.40)
CHLORIDE: 104 mmol/L (ref 101–111)
Creatinine, Ser: 1.9 mg/dL — ABNORMAL HIGH (ref 0.44–1.00)
GLUCOSE: 151 mg/dL — AB (ref 65–99)
HCT: 26 % — ABNORMAL LOW (ref 36.0–46.0)
Hemoglobin: 8.8 g/dL — ABNORMAL LOW (ref 12.0–15.0)
Potassium: 3.8 mmol/L (ref 3.5–5.1)
SODIUM: 137 mmol/L (ref 135–145)
TCO2: 25 mmol/L (ref 22–32)

## 2017-08-03 LAB — COMPREHENSIVE METABOLIC PANEL
ALT: 16 U/L (ref 14–54)
AST: 23 U/L (ref 15–41)
Albumin: 2.4 g/dL — ABNORMAL LOW (ref 3.5–5.0)
Alkaline Phosphatase: 50 U/L (ref 38–126)
Anion gap: 4 — ABNORMAL LOW (ref 5–15)
BUN: 12 mg/dL (ref 6–20)
CO2: 24 mmol/L (ref 22–32)
Calcium: 6.9 mg/dL — ABNORMAL LOW (ref 8.9–10.3)
Chloride: 106 mmol/L (ref 101–111)
Creatinine, Ser: 1.69 mg/dL — ABNORMAL HIGH (ref 0.44–1.00)
GFR calc Af Amer: 35 mL/min — ABNORMAL LOW (ref >60)
GFR calc non Af Amer: 30 mL/min — ABNORMAL LOW (ref >60)
Glucose, Bld: 151 mg/dL — ABNORMAL HIGH (ref 65–99)
Potassium: 4.3 mmol/L (ref 3.5–5.1)
Sodium: 134 mmol/L — ABNORMAL LOW (ref 135–145)
Total Bilirubin: 0.5 mg/dL (ref 0.3–1.2)
Total Protein: 5.6 g/dL — ABNORMAL LOW (ref 6.5–8.1)

## 2017-08-03 LAB — BASIC METABOLIC PANEL
ANION GAP: 8 (ref 5–15)
ANION GAP: 6 (ref 5–15)
BUN: 14 mg/dL (ref 6–20)
BUN: 16 mg/dL (ref 6–20)
CALCIUM: 7.2 mg/dL — AB (ref 8.9–10.3)
CO2: 22 mmol/L (ref 22–32)
CO2: 23 mmol/L (ref 22–32)
CREATININE: 2.04 mg/dL — AB (ref 0.44–1.00)
Calcium: 7 mg/dL — ABNORMAL LOW (ref 8.9–10.3)
Chloride: 105 mmol/L (ref 101–111)
Chloride: 105 mmol/L (ref 101–111)
Creatinine, Ser: 2.01 mg/dL — ABNORMAL HIGH (ref 0.44–1.00)
GFR calc Af Amer: 28 mL/min — ABNORMAL LOW (ref >60)
GFR, EST AFRICAN AMERICAN: 27 mL/min — AB (ref >60)
GFR, EST NON AFRICAN AMERICAN: 24 mL/min — AB (ref >60)
GFR, EST NON AFRICAN AMERICAN: 24 mL/min — AB (ref >60)
GLUCOSE: 172 mg/dL — AB (ref 65–99)
Glucose, Bld: 153 mg/dL — ABNORMAL HIGH (ref 65–99)
POTASSIUM: 3.5 mmol/L (ref 3.5–5.1)
Potassium: 4 mmol/L (ref 3.5–5.1)
SODIUM: 135 mmol/L (ref 135–145)
Sodium: 134 mmol/L — ABNORMAL LOW (ref 135–145)

## 2017-08-03 LAB — LACTIC ACID, PLASMA: Lactic Acid, Venous: 0.8 mmol/L (ref 0.5–1.9)

## 2017-08-03 LAB — VANCOMYCIN, TROUGH: Vancomycin Tr: 29 ug/mL (ref 15–20)

## 2017-08-03 LAB — TROPONIN I: Troponin I: 0.19 ng/mL (ref <0.03)

## 2017-08-03 LAB — OCCULT BLOOD X 1 CARD TO LAB, STOOL: Fecal Occult Bld: POSITIVE — AB

## 2017-08-03 LAB — MAGNESIUM: Magnesium: 1.9 mg/dL (ref 1.7–2.4)

## 2017-08-03 MED ORDER — PROPOFOL 10 MG/ML IV BOLUS
INTRAVENOUS | Status: DC | PRN
  Administered 2017-08-03: 30 mg via INTRAVENOUS

## 2017-08-03 MED ORDER — CHLORHEXIDINE GLUCONATE 0.12 % MT SOLN
OROMUCOSAL | Status: AC
  Filled 2017-08-03: qty 15

## 2017-08-03 MED ORDER — PANTOPRAZOLE SODIUM 40 MG IV SOLR: 40.0000 mg | Freq: Two times a day (BID) | INTRAVENOUS | Status: DC

## 2017-08-03 MED ORDER — VANCOMYCIN HCL 10 G IV SOLR
1250.0000 mg | INTRAVENOUS | Status: DC
  Administered 2017-08-04 – 2017-08-05 (×2): 1250 mg via INTRAVENOUS
  Filled 2017-08-03 (×2): qty 1250

## 2017-08-03 MED ORDER — LIDOCAINE HCL (PF) 1 % IJ SOLN
10.0000 mL | Freq: Once | INTRAMUSCULAR | Status: AC
  Administered 2017-08-03: 10 mL via INTRADERMAL

## 2017-08-03 MED ORDER — DEXTROSE 5 % IV SOLN
1.0000 g | Freq: Two times a day (BID) | INTRAVENOUS | Status: DC
  Administered 2017-08-03 – 2017-08-04 (×2): 1 g via INTRAVENOUS
  Filled 2017-08-03 (×3): qty 1

## 2017-08-03 MED ORDER — SODIUM CHLORIDE 0.9 % IV BOLUS (SEPSIS)
1000.0000 mL | Freq: Once | INTRAVENOUS | Status: AC
  Administered 2017-08-03: 1000 mL via INTRAVENOUS

## 2017-08-03 MED ORDER — LIDOCAINE HCL (PF) 1 % IJ SOLN
INTRAMUSCULAR | Status: AC
  Filled 2017-08-03: qty 10

## 2017-08-03 MED ORDER — ROCURONIUM BROMIDE 100 MG/10ML IV SOLN
INTRAVENOUS | Status: DC | PRN
  Administered 2017-08-03: 30 mg via INTRAVENOUS

## 2017-08-03 MED ORDER — AMIODARONE HCL IN DEXTROSE 360-4.14 MG/200ML-% IV SOLN
60.0000 mg/h | INTRAVENOUS | Status: AC
  Administered 2017-08-03 – 2017-08-04 (×2): 60 mg/h via INTRAVENOUS
  Filled 2017-08-03 (×2): qty 200

## 2017-08-03 MED ORDER — AMIODARONE HCL IN DEXTROSE 360-4.14 MG/200ML-% IV SOLN
30.0000 mg/h | INTRAVENOUS | Status: AC
  Administered 2017-08-04 – 2017-08-08 (×9): 30 mg/h via INTRAVENOUS
  Filled 2017-08-03 (×10): qty 200

## 2017-08-03 MED ORDER — PANTOPRAZOLE SODIUM 40 MG IV SOLR
40.0000 mg | Freq: Two times a day (BID) | INTRAVENOUS | Status: DC
  Administered 2017-08-03 – 2017-08-19 (×33): 40 mg via INTRAVENOUS
  Filled 2017-08-03 (×33): qty 40

## 2017-08-03 MED ORDER — AMIODARONE LOAD VIA INFUSION
150.0000 mg | Freq: Once | INTRAVENOUS | Status: AC
  Administered 2017-08-03: 150 mg via INTRAVENOUS
  Filled 2017-08-03: qty 83.34

## 2017-08-03 NOTE — Progress Notes (Signed)
CRITICAL VALUE ALERT  Critical Value: Troponin 0.19   Date & Time Notied:  08/03/17 2340  Provider Notified: CCM RN Eddie Dibbles made aware- will give message to Dr. Jimmy Footman   Orders Received/Actions taken:

## 2017-08-03 NOTE — Progress Notes (Addendum)
TCTS DAILY ICU PROGRESS NOTE                   [address]            [place] [address]          [phone]   4 Days Post-Op Procedure(s) (LRB): VIDEO ASSISTED THORACOSCOPY (VATS)/WEDGE RESECTION (Right) LOBECTOMY (Right)  Total Length of Stay:  LOS: 4 days   Subjective:  Patient remains on vent, family at bedside.. She has responded some to commands.  Agitation is improved.  She required exchange of ET tube this morning.  Objective: Vital signs in last 24 hours: Temp:  [98.3 F (36.8 C)-99.8 F (37.7 C)] 98.3 F (36.8 C) (09/30 0715) Pulse Rate:  [95-139] 103 (09/30 1000) Cardiac Rhythm: Sinus tachycardia (09/30 0800) Resp:  [12-27] 22 (09/30 1000) BP: (96-153)/(42-91) 115/55 (09/30 1000) SpO2:  [82 %-100 %] 97 % (09/30 1000) Arterial Line BP: (86-224)/(41-89) 99/44 (09/30 1000) FiO2 (%):  [40 %-50 %] 50 % (09/30 0800)  Filed Weights   07/31/17 2000  Weight: 189 lb (85.7 kg)    Weight change:    Hemodynamic parameters for last 24 hours: CVP:  [11 mmHg-13 mmHg] 13 mmHg  Intake/Output from previous day: 09/29 0701 - 09/30 0700 In: 1684.1 [I.V.:1244.1; IV Piggyback:440] Out: 1140 [Urine:610; Emesis/NG output:400; Chest Tube:130]  Intake/Output this shift: Total I/O In: 59 [I.V.:59] Out: -   Current Meds: Scheduled Meds: . acetaminophen  1,000 mg Oral Q6H   Or  . acetaminophen (TYLENOL) oral liquid 160 mg/5 mL  1,000 mg Oral Q6H  . aspirin  325 mg Oral Daily  . atorvastatin  40 mg Oral q1800  . bisacodyl  10 mg Oral Daily  . chlorhexidine gluconate (MEDLINE KIT)  15 mL Mouth Rinse BID  . Chlorhexidine Gluconate Cloth  6 each Topical Daily  . fentaNYL (SUBLIMAZE) injection  50 mcg Intravenous Once  . furosemide  20 mg Intravenous Once  . hydrocortisone sodium succinate  50 mg Intravenous Q6H  . insulin aspart  2-6 Units Subcutaneous Q4H  . levalbuterol  0.63 mg Nebulization Q6H  . mouth rinse  15 mL Mouth Rinse BID  . mouth rinse  15  mL Mouth Rinse QID  . pantoprazole (PROTONIX) IV  40 mg Intravenous Q12H  . senna-docusate  1 tablet Oral QHS  . sodium chloride flush  10-40 mL Intracatheter Q12H   Continuous Infusions: . sodium chloride    . cefTAZidime (FORTAZ)  IV    . fentaNYL infusion INTRAVENOUS 100 mcg/hr (08/03/17 0700)  . norepinephrine (LEVOPHED) Adult infusion 15 mcg/min (08/03/17 0915)  . potassium chloride    . vancomycin Stopped (08/03/17 0345)   PRN Meds:.Place/Maintain arterial line **AND** sodium chloride, acetaminophen, calcium carbonate, docusate, fentaNYL, midazolam, midazolam, ondansetron (ZOFRAN) IV, potassium chloride, sodium chloride flush  General appearance: alert, cooperative and no distress Heart: regular rate and rhythm Lungs: diminished breath sounds bibasilar Abdomen: soft, non-tender; bowel sounds normal; no masses,  no organomegaly Extremities: extremities normal, atraumatic, no cyanosis or edema Wound: clean and dry, + air leak... extensvie sub q air across chest, right side, arm, and abdomen  Lab Results: CBC: Recent Labs  08/02/17 0418 08/03/17 0353  WBC 20.1* 22.9*  HGB 8.4* 8.4*  HCT 26.8* 25.9*  PLT 212 250   BMET:  Recent Labs  08/02/17 0418 08/03/17 0353  NA 138 134*  K 4.2 4.3  CL 111 106  CO2 23 24  GLUCOSE 134* 151*  BUN 8 12  CREATININE 0.77 1.69*  CALCIUM 6.6* 6.9*    CMET: Lab Results  Component Value Date   WBC 22.9 (H) 08/03/2017   HGB 8.4 (L) 08/03/2017   HCT 25.9 (L) 08/03/2017   PLT 250 08/03/2017   GLUCOSE 151 (H) 08/03/2017   CHOL 93 01/17/2017   TRIG 113 01/17/2017   HDL 40 (L) 01/17/2017   LDLCALC 30 01/17/2017   ALT 16 08/03/2017   AST 23 08/03/2017   NA 134 (L) 08/03/2017   K 4.3 08/03/2017   CL 106 08/03/2017   CREATININE 1.69 (H) 08/03/2017   BUN 12 08/03/2017   CO2 24 08/03/2017   INR 0.99 07/28/2017   HGBA1C 5.4 01/17/2017      PT/INR: No results for input(s): LABPROT, INR in the last 72 hours. Radiology: Dg Chest  Port 1 View  Result Date: 08/03/2017 CLINICAL DATA:  New endotracheal tube placement. EXAM: PORTABLE CHEST 1 VIEW COMPARISON:  1 hour prior. FINDINGS: Endotracheal tube tip just distal to the clavicular heads, the carina is not well visualized. Enteric tube remains in place. Right internal jugular central venous catheter tip in the mid SVC. Right chest tube in place. Previous right pneumothorax is not confidently visualized. Massive subcutaneous emphysema again seen. Mild leftward mediastinal shift which is similar to prior. IMPRESSION: 1. Endotracheal tube tip just distal clavicular heads, above the carina which is not well-defined. 2. Massive subcutaneous emphysema. Right pneumothorax on prior exam is not confidently visualized, obscured by subcutaneous emphysema. Mild leftward mediastinal shift persists. Electronically Signed   By: Jeb Levering M.D.   On: 08/03/2017 06:12   Dg Chest Port 1 View  Result Date: 08/03/2017 CLINICAL DATA:  Respiratory failure. EXAM: PORTABLE CHEST 1 VIEW COMPARISON:  Radiograph yesterday at 0523 hour FINDINGS: Progressive massive subcutaneous emphysema which obscures evaluation. Endotracheal tube in place, carina is not well-defined. Right central line and endotracheal tube remain in place. Suspect increasing size of right pneumothorax, however difficult to differentiate from adjacent overlying monitoring devices. Right chest tube remains in place. Leftward mediastinal shift may be due to patient rotation or tension. IMPRESSION: 1. Suspect worsening right pneumothorax, obscured by overlying monitoring devices and massive subcutaneous emphysema. Leftward mediastinal shift from prior exam may be due to tension or patient rotation. 2. Increased subcutaneous emphysema throughout the chest. 3. Endotracheal tube, enteric tube, and right central line remain in place. These results will be called to the ordering clinician or representative by the Radiologist Assistant, and  communication documented in the PACS or zVision Dashboard. Electronically Signed   By: Jeb Levering M.D.   On: 08/03/2017 05:17     Assessment/Plan: S/P Procedure(s) (LRB): VIDEO ASSISTED THORACOSCOPY (VATS)/WEDGE RESECTION (Right) LOBECTOMY (Right)  1. Neuro- on vent, weaning sedation as tolerated, patient is following commands at time 2. Chest tube- + air leak, massive sub q air on CXR, pneumothorax appears a little larger to me this morning, but very difficulty to identify with extent of air present 3. CV- remains on levophed CCM weaning 4. ID- + leukocytosis, afebrile, respiratory culture remains pending, blood cultures no growth so far 5. GI- coffee grounds appearance of NG tube, Lovenox D/c per CCM, protonix increased, Hgb stable at 8.4 6. Pulm- CCM managing vent 7. dispo- patient remains critically ill, continue current workup/care... May need new chest tube with increase in sub q air and possible enlargement of pneumothorax... Will discuss with Dr. Haynes Bast, [facility] 08/03/2017 11:04 AM   Acute Kidney Injury (any  one)  Increase in SCr by > 0.3 within 48 hours  Increase SCr to > 1.5 times baseline  Urine volume < 0.5 ml/kg/h for 6 hrs  ?Stage 1 - Increase in serum creatinine to 1.5 to 1.9 times baseline, or increase in serum creatinine by ?0.3 mg/dL (?26.5 micromol/L), or reduction in urine output to <0.5 mL/kg per hour for 6 to 12 hours.  ?Stage 2 - Increase in serum creatinine to 2.0 to 2.9 times baseline, or reduction in urine output to <0.5 mL/kg per hour for ?12 hours.  ?Stage 3 - Increase in serum creatinine to 3.0 times baseline, or increase in serum creatinine to ?4.0 mg/dL (?353.6 micromol/L), or reduction in urine output to <0.3 mL/kg per hour for ?24 hours, or anuria for ?12 hours, or the initiation of renal replacement therapy, or, in patients <18 years, decrease in eGFR to <35 mL   Lab Results  Component Value Date   CREATININE 2.04 (H) 08/03/2017    Estimated Creatinine Clearance: 26.9 mL/min (A) (by C-G formula based on SCr of 2.04 mg/dL (H)).   AKI  sgate 2  Small air leak from chest tube but sub q air has increased  Discussed with patients daughter placing an additional chest tube , risks and options reviewed with her. Daughter is agreeable and has signed permit for mother who is sedated  I have seen and examined Kathleen Guerrero and agree with the above assessment  and plan.  Grace Isaac MD Beeper [phone] Office [phone] 08/03/2017 6:17 PM

## 2017-08-03 NOTE — Progress Notes (Signed)
Elink MD made aware of NG output, 1179mL. Orders received to send BMET, CBC.  Will continue to monitor.

## 2017-08-03 NOTE — Procedures (Signed)
Chest Tube Insertion Procedure Note  Indications:  Clinically significant Pneumothorax and sub q air   Pre-operative Diagnosis: Pneumothorax  Post-operative Diagnosis: Pneumothorax  Procedure Details  Informed consent was obtained for the procedure, including sedation.  Risks of lung perforation, hemorrhage, arrhythmia, and adverse drug reaction were discussed with patients daughter , patient sedated on vent   After sterile skin prep, using standard technique, a 20 French tube was placed in the right anterior 4 rib space.  Findings: Air returned   Estimated Blood Loss:  Minimal         Specimens:  None              Complications:  None; patient tolerated the procedure well.         Disposition: ICU - intubated and critically ill.         Condition: stable  Attending Attestation: I performed the procedure.

## 2017-08-03 NOTE — Progress Notes (Signed)
Pharmacy Antibiotic Note  Kathleen Guerrero is a 71 y.o. female admitted on 07/30/2017 with right upper lobe nodule now s/p VATS.  Pharmacy has been consulted for vancomycin and fortaz dosing empirically for fever.  Febrile this morning at 101. Wbc is trending up to 22. Scr doubled overnight was normal yesterday, uop down.   Vancomycin level was 29, will adjust dose.  Plan: Hold afternoon vancomycin dose and change to 1250mg  IV q24 - start tomorrow Decrease Fortaz to 1g q12 hours  Follow bmet closely Recheck Vancomycin trough as indicated Follow up culture results  Height: 5' 2.75" (159.4 cm) Weight: 189 lb (85.7 kg) IBW/kg (Calculated) : 51.83  Temp (24hrs), Avg:98.8 F (37.1 C), Min:98.3 F (36.8 C), Max:99.8 F (37.7 C)   Recent Labs Lab 07/28/17 1051 07/30/17 1404 07/31/17 0545 08/01/17 0320 08/02/17 0038 08/02/17 0418 08/03/17 0353  WBC 6.8 20.9* 12.5* 10.8*  --  20.1* 22.9*  CREATININE 0.77  --  0.81 0.68  --  0.77 1.69*  LATICACIDVEN  --   --   --   --  0.7 0.9 0.8    Estimated Creatinine Clearance: 32.4 mL/min (A) (by C-G formula based on SCr of 1.69 mg/dL (H)).    No Known Allergies  Antimicrobials this admission: Vanc 9/28>> Tressie Ellis 9/28>>  Microbiology results: 9/28 bldx2:ngtd 9/28 TA: rare gpr/gpc  Thank you for allowing pharmacy to be a part of this patient's care.  Erin Hearing PharmD., BCPS Clinical Pharmacist Pager [phone] 08/03/2017 10:35 AM

## 2017-08-03 NOTE — Progress Notes (Signed)
..   Name: Kathleen Guerrero MRN: [id number] DOB: [birth year]    ADMISSION DATE:  07/30/2017 CONSULTATION DATE:  08/01/17  REFERRING MD :  Roxan Hockey MD  CHIEF COMPLAINT:    BRIEF PATIENT DESCRIPTION: 71 yr old female with PMHx GERD, hiatal hernia, 50 pack yr smoker, HTN, HLD, CVA presented for RUL GGO on CT of the chest 13x9x40mm in size which showed increase in size to 13x20 mm. POD 2 s/p Right sided VATS with RUL lobectomy. Developed acute respiratory distress at 10:45 PM accompanied with new crepitus over right chest wall. S/p intubated and now on PRVC.   SIGNIFICANT EVENTS  9/26> Right sided VATS s/p RUL Lobectomy.  9/28 >Acute hypoxic respiratory failure with new subcutaneous emphysema>intubated /vent   STUDIES:  PFTs - Pulmonary function tests November 2017 FVC 3.19 (100%) FEV1 2.36 (97%) CT Chest 07/09/17> interval progression RUL nodule 70mm to 39mm   Cultures :  Sputum 9/28 >> [facility] 9/28 >>   HISTORY OF PRESENT ILLNESS:  71 yr old female with PMHx GERD, hiatal hernia, 50 pack yr smoker, HTN, HLD, CVA presented for h/o RUL GGO on CT of the chest 13x9x57mm in size which increased in size to 13x20 mm.  At her baseline she is able to ambulate without difficulty and denied any symptoms of hemoptysis wheezing or chest pain on initial presentation. She also denied constitutional symptoms ( weight loss,fever, or change in appetite). Post Op per nurse and CT Surgery pt was increasingly altered required Haldol Ativan and eventually Precedex ggt.   Now on POD 2 s/p Right sided VATS with RUL lobectomy. Earlier patient developed a fever. Previously scheduled transfusion was held due to fever. Later this evening  pt developed acute respiratory distress at 10:45 PM desaturating tachycardic and with new crepitus over right chest wall extending into the neck. Emergent intubation indicated. On my initial exam pt not alert and not responsive. Grade 1 view. Chords visualized using Glideoscope  pt was  intubated with 7.5 ETT in at 22 Lip. continues on PRVC.Became hypotensive post intubation requiring IVF bolus and started on Levophed MAP goal>55mmHg.   SUBJECTIVE:  Pt with cuff leak this am  requiring ETT change per anesthesia . Reported as difficult airway.  Episodes of severe agitation w/ large b/p swings . Remains on Fent.  Following commands this am .  Remains on pressor support with increased demands.  Episodes of severe agitation overnight requiring restraints , improved Fent gtt  Remains on pressors with levophed at 71mcg . B/p with wide flucuations.  Family updated at bedside.  NGT with coffee ground material   VITAL SIGNS: Temp:  [98.3 F (36.8 C)-99.8 F (37.7 C)] 98.3 F (36.8 C) (09/30 0715) Pulse Rate:  [95-139] 110 (09/30 0930) Resp:  [12-27] 22 (09/30 0930) BP: (96-153)/(42-91) 116/53 (09/30 0930) SpO2:  [82 %-100 %] 96 % (09/30 0930) Arterial Line BP: (86-224)/(41-89) 108/48 (09/30 0930) FiO2 (%):  [40 %-50 %] 50 % (09/30 0800)  PHYSICAL EXAMINATION: General: sedated on vent  Neuro: sedated with intermittent agitation . F/c  HEENT: ETT  Cardiovascular: ST  Lungs: decreased BS , R CT to sxn , +air leak, + increased crepitus on right .  Abdomen:  Soft ,+BS , OG to sxn +coffee ground material  Musculoskeletal:intact  Skin:  Dry /no rash .   Recent Labs Lab 08/01/17 0320 08/02/17 0418 08/03/17 0353  NA 139 138 134*  K 3.9 4.2 4.3  CL 109 111 106  CO2 24 23 24  BUN 11 8 12   CREATININE 0.68 0.77 1.69*  GLUCOSE 115* 134* 151*    Recent Labs Lab 08/01/17 0320 08/02/17 0418 08/03/17 0353  HGB 7.2* 8.4* 8.4*  HCT 22.1* 26.8* 25.9*  WBC 10.8* 20.1* 22.9*  PLT 143* 212 250   Dg Chest Port 1 View  Result Date: 08/03/2017 CLINICAL DATA:  New endotracheal tube placement. EXAM: PORTABLE CHEST 1 VIEW COMPARISON:  1 hour prior. FINDINGS: Endotracheal tube tip just distal to the clavicular heads, the carina is not well visualized. Enteric tube remains in  place. Right internal jugular central venous catheter tip in the mid SVC. Right chest tube in place. Previous right pneumothorax is not confidently visualized. Massive subcutaneous emphysema again seen. Mild leftward mediastinal shift which is similar to prior. IMPRESSION: 1. Endotracheal tube tip just distal clavicular heads, above the carina which is not well-defined. 2. Massive subcutaneous emphysema. Right pneumothorax on prior exam is not confidently visualized, obscured by subcutaneous emphysema. Mild leftward mediastinal shift persists. Electronically Signed   By: Jeb Levering M.D.   On: 08/03/2017 06:12   Dg Chest Port 1 View  Result Date: 08/03/2017 CLINICAL DATA:  Respiratory failure. EXAM: PORTABLE CHEST 1 VIEW COMPARISON:  Radiograph yesterday at 0523 hour FINDINGS: Progressive massive subcutaneous emphysema which obscures evaluation. Endotracheal tube in place, carina is not well-defined. Right central line and endotracheal tube remain in place. Suspect increasing size of right pneumothorax, however difficult to differentiate from adjacent overlying monitoring devices. Right chest tube remains in place. Leftward mediastinal shift may be due to patient rotation or tension. IMPRESSION: 1. Suspect worsening right pneumothorax, obscured by overlying monitoring devices and massive subcutaneous emphysema. Leftward mediastinal shift from prior exam may be due to tension or patient rotation. 2. Increased subcutaneous emphysema throughout the chest. 3. Endotracheal tube, enteric tube, and right central line remain in place. These results will be called to the ordering clinician or representative by the Radiologist Assistant, and communication documented in the PACS or zVision Dashboard. Electronically Signed   By: Jeb Levering M.D.   On: 08/03/2017 05:17   Dg Chest Port 1 View  Result Date: 08/02/2017 CLINICAL DATA:  Status post lobectomy on the right EXAM: PORTABLE CHEST 1 VIEW COMPARISON:   Yesterday FINDINGS: Stable positioning of right-sided chest tube with small right apical pneumothorax, less than 10%. Unchanged positioning of the endotracheal tube relative to the clavicular heads. The carina is not visible. Right IJ central line with tip near the upper cavoatrial junction. There is an implantable loop recorder. New orogastric tube that reaches the stomach. Chest wall emphysema that is progressive in the left breast. Cardiomegaly and postoperative distortion of mediastinal contours. IMPRESSION: 1. Unchanged small right apical pneumothorax. Chest wall emphysema with progression into the left breast. 2. Nonvisualized carina. Unchanged positioning of endotracheal tube relative to the clavicular heads. 3. New orogastric tube which reaches the stomach. Electronically Signed   By: Monte Fantasia M.D.   On: 08/02/2017 07:48   Dg Chest Port 1 View  Result Date: 08/01/2017 CLINICAL DATA:  71 y/o F; ETT and chest tube placement. Right-sided colectomy. EXAM: PORTABLE CHEST 1 VIEW COMPARISON:  08/01/2017 chest radiograph. FINDINGS: Stable position of right-sided chest tube. Endotracheal tube tip in the lower trachea, the carina is not visualized. The trachea is deviated rightward due to volume loss in the right hemithorax. Right central venous catheter tip projects over lower SVC. Stable cardiac silhouette given projection and technique. Marked interval increase in subcutaneous emphysema throughout the right chest  wall extending into the lower neck. Interval decrease in right-sided pneumothorax. No new acute osseous abnormality identified. IMPRESSION: 1. Extensive soft tissue emphysema throughout the right chest wall extending in the lower neck partially obscures underlying structures. 2. Endotracheal tube tip projects over the lower trachea, carina is not visualized. 3. Stable position of right-sided chest tube, interval decrease of right pneumothorax. Electronically Signed   By: Kristine Garbe  M.D.   On: 08/01/2017 22:47   Dg Abd Portable 1v  Result Date: 08/02/2017 CLINICAL DATA:  OG tube placement. EXAM: PORTABLE ABDOMEN - 1 VIEW COMPARISON:  Chest radiograph yesterday. FINDINGS: Tip of the enteric tube in the midesophagus, advancement of at least 15 cm recommended. Gaseous gastric distention in the upper abdomen is partially included. Chest tube in place. Rather extensive subcutaneous emphysema about the chest wall, right greater than left. IMPRESSION: Tip of the enteric tube in the midesophagus. Recommend advancement of at least 15 cm to place the tip below the diaphragm. There is gaseous gastric distention. Electronically Signed   By: Jeb Levering M.D.   On: 08/02/2017 02:54     ASSESSMENT / PLAN:  NEURO: Sedated Altered mental status post VATS   Plan   Cont w/ ICU sedation with Fent  Versed As needed     CARDIAC: Hypotension -Septic Shock  Lactate /PCT nml . Echo 01/2017 EF 60%. Cortisol 8.9 9/29   Plan  Pressors w/ Levophed wean for MAP >65  Cont stress dose steroids (started 9/29 )    PULMONARY: Acute hypoxic respiratory failure Difficult intubation- has a prior h/o OSA Intubated by Anesthesia with 7.5 ETT at 22 at lip, ETT changed 9/30 d/t cuff leak.  Has a reported lac on  Right posterior pharynx. Trachea deviated to right , subcutaneous emphysema -CT w/ airleak  S/p Right sided VATS with wedge resection 9/26  well differentiated AdenoCA Stage Ia- pathology of 2 cm RUL mass   Plan  Mechanical ventilation PRVC Right chest tube to suction @-30 mgmt per CT Surgery Daily SBT  VAP  Wean as able.   ID: Sepsis Possible HCAP   Plan  Cx pending  Cont Vanc /Fortaz   Endocrine: No h/o insulin dependence   Plan  ICU Glycemic protocol    GI: Coffee ground material in OG ? GIB  Plan  NPO OG Tube to sxn  Will assess status in AM with nutrition Increase PPI Twice daily   D/c Lovenox   Heme: Anemia with sig drop in Hb - Hgb trend  13.6->10.2->9.5->8.5->7.2 >8.4>8.4  Per OR report 900cc Blood loss- received 2 units PRBCs in OR 9/26   s/p 1 u PRBC 9/28    Plan  If Hgb <7 transfuse PRBCs or per CT Surgery's orders SCDs for VTE ppx  RENAL Indwelling foley catheter in place Acute renal Failure in setting of sepsis /hypotension       Lab Results  Component Value Date   CREATININE 1.69 (H) 08/03/2017   CREATININE 0.77 08/02/2017   CREATININE 0.68 08/01/2017    Plan  I/O  Electrolyte replacement as needed bmet in am  Avoid nephrotoxins if able     DISPOSITION: continue in ICU 2H Mgmt per CT Surgery with CCM consulted for overall critical care   Prognosis Guarded Family: family updated at bedside 08/03/17   ,Tammy Parrett NP-C  [facility]  [phone]   08/03/2017, 9:48 AM  Attending Note:  I have examined patient, reviewed labs, studies and notes. I have discussed  the case with T Parrett, and I agree with the data and plans as amended above. 71 year old woman with probable COPD, GERD with hiatal hernia, history of stroke. She underwent VATS right upper lobectomy for a groundglass nodule found to be adenocarcinoma the lung, stage I. She has experienced, complicated postoperative course that has included delirium and agitation likely due to evolving respiratory failure, shock. She's been treated for suspected healthcare associated pneumonia.. She has massive subcutaneous air and some probable residual right pneumothorax although difficult to see on chest x-ray. She had a cuff leak last night and her endotracheal tube had to be exchanged. It was noted that her upper airway is irritated and edematous. On evaluation today she is sedated but will wake to voice and stimulation. Her pressors have been titrated up, norepinephrine currently at 28. Her wall suction has been increased to -30 cmH2O. lungs are coarse bilaterally. She has significant subcutaneous air from neck to arm. We will continue her  current antibiotics. I will avoid increasing her PEEP given the evidence for air leak. If the existing chest tube is not adequate to manage the airleak and subcutaneous emphysema increases or chest x-ray changes then she may very well require a second right-sided chest tube. I will repeat a chest tube this afternoon. Independent critical care time is 33 minutes.   Baltazar Apo, MD, PhD 08/03/2017, 2:12 PM [facility] [phone] or if no answer [phone]

## 2017-08-03 NOTE — Anesthesia Procedure Notes (Signed)
Procedure Name: Intubation Date/Time: 08/03/2017 5:35 AM Performed by: Manuela Schwartz B Pre-anesthesia Checklist: Patient identified, Emergency Drugs available, Suction available and Patient being monitored Patient Re-evaluated:Patient Re-evaluated prior to induction Oxygen Delivery Method: Ambu bag Preoxygenation: Pre-oxygenation with 100% oxygen Induction Type: IV induction Grade View: Grade I Tube type: Subglottic suction tube Tube size: 7.5 mm Number of attempts: 1 Airway Equipment and Method: Oral airway,  Video-laryngoscopy,  Bougie stylet and Stylet Placement Confirmation: ETT inserted through vocal cords under direct vision,  breath sounds checked- equal and bilateral and CO2 detector Secured at: 21 cm Tube secured with: Tape Dental Injury: Teeth and Oropharynx as per pre-operative assessment

## 2017-08-03 NOTE — Progress Notes (Signed)
[facility] Progress Note Patient Name: Kathleen Guerrero DOB: [birth year] MRN: [id number]   Date of Service  08/03/2017  HPI/Events of Note  Notified by respiratory therapy that patient has a cuff leak & endotracheal tube is not maintaining cuff pressure. Ventilator shows decreased volume on exhalation compared with inspiratory tidal volume. Intensivists currently at [facility] placing a chest tube.   eICU Interventions  1. Contacted in the hospital operator for emergent anesthesia need for tube change 2. Notified bedside nurse of plan     Intervention Category Major Interventions: Respiratory failure - evaluation and management  Tera Partridge 08/03/2017, 5:09 AM

## 2017-08-03 NOTE — Progress Notes (Signed)
eLink Physician-Brief Progress Note Patient Name: Kathleen Guerrero DOB: Jan 28, 1946 MRN: [id number]   Date of Service  08/03/2017  HPI/Events of Note  A fib with RVR.  eICU Interventions  Start amiodarone.  Check BMET, Mg, troponin.     Intervention Category Major Interventions: Other:  Akif Weldy 08/03/2017, 10:23 PM

## 2017-08-03 NOTE — Progress Notes (Signed)
  Amiodarone Drug - Drug Interaction Consult Note  Recommendations: Ensure potassium stays in range with Lasix Watch for signs of myopathy with atorvastatin  Amiodarone is metabolized by the cytochrome P450 system and therefore has the potential to cause many drug interactions. Amiodarone has an average plasma half-life of 50 days (range 20 to 100 days).   There is potential for drug interactions to occur several weeks or months after stopping treatment and the onset of drug interactions may be slow after initiating amiodarone.   [x]  Statins: Increased risk of myopathy. Simvastatin- restrict dose to 20mg  daily. Other statins: counsel patients to report any muscle pain or weakness immediately.  []  Anticoagulants: Amiodarone can increase anticoagulant effect. Consider warfarin dose reduction. Patients should be monitored closely and the dose of anticoagulant altered accordingly, remembering that amiodarone levels take several weeks to stabilize.  []  Antiepileptics: Amiodarone can increase plasma concentration of phenytoin, the dose should be reduced. Note that small changes in phenytoin dose can result in large changes in levels. Monitor patient and counsel on signs of toxicity.  []  Beta blockers: increased risk of bradycardia, AV block and myocardial depression. Sotalol - avoid concomitant use.  []   Calcium channel blockers (diltiazem and verapamil): increased risk of bradycardia, AV block and myocardial depression.  []   Cyclosporine: Amiodarone increases levels of cyclosporine. Reduced dose of cyclosporine is recommended.  []  Digoxin dose should be halved when amiodarone is started.  [x]  Diuretics: increased risk of cardiotoxicity if hypokalemia occurs.  []  Oral hypoglycemic agents (glyburide, glipizide, glimepiride): increased risk of hypoglycemia. Patient's glucose levels should be monitored closely when initiating amiodarone therapy.   []  Drugs that prolong the QT interval:  Torsades  de pointes risk may be increased with concurrent use - avoid if possible.  Monitor QTc, also keep magnesium/potassium WNL if concurrent therapy can't be avoided. Marland Kitchen Antibiotics: e.g. fluoroquinolones, erythromycin. . Antiarrhythmics: e.g. quinidine, procainamide, disopyramide, sotalol. . Antipsychotics: e.g. phenothiazines, haloperidol.  . Lithium, tricyclic antidepressants, and methadone.  Thank You,  Mailin Coglianese  08/03/2017 10:26 PM

## 2017-08-04 ENCOUNTER — Inpatient Hospital Stay (HOSPITAL_COMMUNITY): Payer: Medicare HMO

## 2017-08-04 DIAGNOSIS — J860 Pyothorax with fistula: Secondary | ICD-10-CM

## 2017-08-04 DIAGNOSIS — G9340 Encephalopathy, unspecified: Secondary | ICD-10-CM

## 2017-08-04 LAB — BASIC METABOLIC PANEL
ANION GAP: 5 (ref 5–15)
BUN: 17 mg/dL (ref 6–20)
CHLORIDE: 108 mmol/L (ref 101–111)
CO2: 23 mmol/L (ref 22–32)
Calcium: 6.9 mg/dL — ABNORMAL LOW (ref 8.9–10.3)
Creatinine, Ser: 1.99 mg/dL — ABNORMAL HIGH (ref 0.44–1.00)
GFR calc non Af Amer: 24 mL/min — ABNORMAL LOW (ref >60)
GFR, EST AFRICAN AMERICAN: 28 mL/min — AB (ref >60)
Glucose, Bld: 173 mg/dL — ABNORMAL HIGH (ref 65–99)
POTASSIUM: 3.6 mmol/L (ref 3.5–5.1)
SODIUM: 136 mmol/L (ref 135–145)

## 2017-08-04 LAB — CBC
HCT: 22.1 % — ABNORMAL LOW (ref 36.0–46.0)
HEMOGLOBIN: 7.2 g/dL — AB (ref 12.0–15.0)
MCH: 30 pg (ref 26.0–34.0)
MCHC: 32.6 g/dL (ref 30.0–36.0)
MCV: 92.1 fL (ref 78.0–100.0)
Platelets: 303 10*3/uL (ref 150–400)
RBC: 2.4 MIL/uL — AB (ref 3.87–5.11)
RDW: 15.4 % (ref 11.5–15.5)
WBC: 15.9 10*3/uL — AB (ref 4.0–10.5)

## 2017-08-04 LAB — GLUCOSE, CAPILLARY
GLUCOSE-CAPILLARY: 193 mg/dL — AB (ref 65–99)
GLUCOSE-CAPILLARY: 144 mg/dL — AB (ref 65–99)
Glucose-Capillary: 147 mg/dL — ABNORMAL HIGH (ref 65–99)
Glucose-Capillary: 124 mg/dL — ABNORMAL HIGH (ref 65–99)
Glucose-Capillary: 136 mg/dL — ABNORMAL HIGH (ref 65–99)

## 2017-08-04 LAB — CULTURE, RESPIRATORY
CULTURE: NORMAL
SPECIAL REQUESTS: NORMAL

## 2017-08-04 LAB — TROPONIN I
TROPONIN I: 0.17 ng/mL — AB (ref <0.03)
Troponin I: 0.12 ng/mL (ref <0.03)

## 2017-08-04 LAB — POCT I-STAT 3, ART BLOOD GAS (G3+)
Acid-base deficit: 1 mmol/L (ref 0.0–2.0)
BICARBONATE: 25.9 mmol/L (ref 20.0–28.0)
O2 SAT: 95 %
PCO2 ART: 58 mmHg — AB (ref 32.0–48.0)
Patient temperature: 98.9
TCO2: 28 mmol/L (ref 22–32)
pH, Arterial: 7.259 — ABNORMAL LOW (ref 7.350–7.450)
pO2, Arterial: 88 mmHg (ref 83.0–108.0)

## 2017-08-04 LAB — PHOSPHORUS
Phosphorus: 1.5 mg/dL — ABNORMAL LOW (ref 2.5–4.6)
Phosphorus: <1 mg/dL — CL (ref 2.5–4.6)

## 2017-08-04 LAB — MAGNESIUM
MAGNESIUM: 2.5 mg/dL — AB (ref 1.7–2.4)
Magnesium: 2.4 mg/dL (ref 1.7–2.4)

## 2017-08-04 LAB — LACTIC ACID, PLASMA: Lactic Acid, Venous: 1.3 mmol/L (ref 0.5–1.9)

## 2017-08-04 LAB — PREPARE RBC (CROSSMATCH)

## 2017-08-04 LAB — PROCALCITONIN: PROCALCITONIN: 0.29 ng/mL

## 2017-08-04 MED ORDER — QUETIAPINE FUMARATE 25 MG PO TABS: 25.0000 mg | ORAL_TABLET | Freq: Two times a day (BID) | ORAL | Status: DC

## 2017-08-04 MED ORDER — ORAL CARE MOUTH RINSE
15.0000 mL | OROMUCOSAL | Status: DC
  Administered 2017-08-04 – 2017-08-14 (×103): 15 mL via OROMUCOSAL

## 2017-08-04 MED ORDER — CEFTAZIDIME 1 G IJ SOLR
1.0000 g | INTRAMUSCULAR | Status: DC
  Administered 2017-08-05: 1 g via INTRAVENOUS
  Filled 2017-08-04: qty 1

## 2017-08-04 MED ORDER — SODIUM CHLORIDE 0.9 % IV SOLN: Freq: Once | INTRAVENOUS | Status: AC

## 2017-08-04 MED ORDER — AMIODARONE IV BOLUS ONLY 150 MG/100ML: 150.0000 mg | Freq: Once | INTRAVENOUS | Status: DC

## 2017-08-04 MED ORDER — DEXMEDETOMIDINE HCL IN NACL 400 MCG/100ML IV SOLN
0.4000 ug/kg/h | INTRAVENOUS | Status: DC
  Administered 2017-08-04: 1 ug/kg/h via INTRAVENOUS
  Administered 2017-08-05: 1.2 ug/kg/h via INTRAVENOUS
  Administered 2017-08-05 (×2): 1 ug/kg/h via INTRAVENOUS
  Administered 2017-08-05: 1.2 ug/kg/h via INTRAVENOUS
  Administered 2017-08-05: 1 ug/kg/h via INTRAVENOUS
  Administered 2017-08-06 (×5): 1.2 ug/kg/h via INTRAVENOUS
  Administered 2017-08-07 (×2): 1 ug/kg/h via INTRAVENOUS
  Administered 2017-08-07 (×2): 1.2 ug/kg/h via INTRAVENOUS
  Administered 2017-08-07 – 2017-08-08 (×3): 1 ug/kg/h via INTRAVENOUS
  Administered 2017-08-08: 0.6 ug/kg/h via INTRAVENOUS
  Administered 2017-08-08: 1 ug/kg/h via INTRAVENOUS
  Administered 2017-08-09 (×2): 0.8 ug/kg/h via INTRAVENOUS
  Administered 2017-08-10 (×2): 0.5 ug/kg/h via INTRAVENOUS
  Administered 2017-08-10: 0.6 ug/kg/h via INTRAVENOUS
  Administered 2017-08-11: 0.5 ug/kg/h via INTRAVENOUS
  Filled 2017-08-04 (×32): qty 100

## 2017-08-04 MED ORDER — POTASSIUM PHOSPHATES 15 MMOLE/5ML IV SOLN
40.0000 meq | Freq: Once | INTRAVENOUS | Status: AC
  Administered 2017-08-04: 40 meq via INTRAVENOUS
  Filled 2017-08-04: qty 9.09

## 2017-08-04 MED ORDER — POTASSIUM CHLORIDE 10 MEQ/50ML IV SOLN
10.0000 meq | INTRAVENOUS | Status: AC
  Filled 2017-08-04: qty 50

## 2017-08-04 MED ORDER — PRO-STAT SUGAR FREE PO LIQD
30.0000 mL | Freq: Two times a day (BID) | ORAL | Status: DC
  Administered 2017-08-04: 30 mL

## 2017-08-04 MED ORDER — VITAL HIGH PROTEIN PO LIQD: 1000.0000 mL | ORAL | Status: DC

## 2017-08-04 MED ORDER — SODIUM CHLORIDE 0.9% FLUSH
10.0000 mL | Freq: Two times a day (BID) | INTRAVENOUS | Status: DC
  Administered 2017-08-04 – 2017-08-13 (×17): 10 mL

## 2017-08-04 MED ORDER — AMIODARONE LOAD VIA INFUSION
150.0000 mg | Freq: Once | INTRAVENOUS | Status: AC
  Administered 2017-08-04: 150 mg via INTRAVENOUS
  Filled 2017-08-04: qty 83.34

## 2017-08-04 MED ORDER — MAGNESIUM SULFATE 2 GM/50ML IV SOLN
2.0000 g | Freq: Once | INTRAVENOUS | Status: AC
  Administered 2017-08-04: 2 g via INTRAVENOUS
  Filled 2017-08-04: qty 50

## 2017-08-04 MED ORDER — SODIUM CHLORIDE 0.9% FLUSH
10.0000 mL | INTRAVENOUS | Status: DC | PRN
  Filled 2017-08-04: qty 40

## 2017-08-04 MED ORDER — MIDAZOLAM HCL 2 MG/2ML IJ SOLN
1.0000 mg | INTRAMUSCULAR | Status: DC | PRN
  Administered 2017-08-04 – 2017-08-07 (×17): 2 mg via INTRAVENOUS
  Filled 2017-08-04 (×19): qty 2

## 2017-08-04 MED ORDER — VITAL HIGH PROTEIN PO LIQD
1000.0000 mL | ORAL | Status: DC
  Administered 2017-08-04: 1000 mL

## 2017-08-04 MED ORDER — CHLORHEXIDINE GLUCONATE CLOTH 2 % EX PADS
6.0000 | MEDICATED_PAD | Freq: Every day | CUTANEOUS | Status: DC
  Administered 2017-08-04 – 2017-08-16 (×9): 6 via TOPICAL

## 2017-08-04 MED ORDER — PRO-STAT SUGAR FREE PO LIQD
30.0000 mL | Freq: Two times a day (BID) | ORAL | Status: DC
  Administered 2017-08-04: 30 mL
  Filled 2017-08-04: qty 30

## 2017-08-04 NOTE — Progress Notes (Signed)
5 Days Post-Op Procedure(s) (LRB): VIDEO ASSISTED THORACOSCOPY (VATS)/WEDGE RESECTION (Right) LOBECTOMY (Right) Subjective: Intubated, mildly agitated but does follow some commands  Objective: Vital signs in last 24 hours: Temp:  [97.7 F (36.5 C)-98.9 F (37.2 C)] 98.9 F (37.2 C) (10/01 0400) Pulse Rate:  [67-164] 87 (10/01 0715) Cardiac Rhythm: Normal sinus rhythm (10/01 0630) Resp:  [15-31] 28 (10/01 0715) BP: (53-187)/(33-150) 133/60 (10/01 0715) SpO2:  [92 %-100 %] 100 % (10/01 0715) Arterial Line BP: (51-180)/(35-84) 68/61 (10/01 0715) FiO2 (%):  [50 %-60 %] 50 % (10/01 0700)  Hemodynamic parameters for last 24 hours:    Intake/Output from previous day: 09/30 0701 - 10/01 0700 In: 1659.4 [I.V.:1509.4; IV Piggyback:150] Out: 3748 [Urine:530; Emesis/NG output:1100; Chest Tube:190] Intake/Output this shift: No intake/output data recorded.  General appearance: mild distress Neurologic: following some simple commands Heart: tachy,regular Lungs: rhonchi bilaterally and wheezes bilaterally Abdomen: normal findings: soft, non-tender + large air leak  Lab Results:  Recent Labs  08/03/17 2047 08/03/17 2225 08/04/17 0417  WBC 17.7*  --  15.9*  HGB 7.1* 8.8* 7.2*  HCT 22.2* 26.0* 22.1*  PLT 286  --  303   BMET:  Recent Labs  08/03/17 2047 08/03/17 2225 08/04/17 0417  NA 134* 137 136  K 3.5 3.8 3.6  CL 105 104 108  CO2 23  --  23  GLUCOSE 172* 151* 173*  BUN 16 20 17   CREATININE 2.01* 1.90* 1.99*  CALCIUM 7.0*  --  6.9*    PT/INR: No results for input(s): LABPROT, INR in the last 72 hours. ABG    Component Value Date/Time   PHART 7.259 (L) 08/04/2017 0424   HCO3 25.9 08/04/2017 0424   TCO2 28 08/04/2017 0424   ACIDBASEDEF 1.0 08/04/2017 0424   O2SAT 95.0 08/04/2017 0424   CBG (last 3)   Recent Labs  08/03/17 1309 08/03/17 1707 08/03/17 2014  GLUCAP 155* 149* 155*    Assessment/Plan: S/P Procedure(s) (LRB): VIDEO ASSISTED THORACOSCOPY  (VATS)/WEDGE RESECTION (Right) LOBECTOMY (Right) -  NEURO- postop delirium  CV- rapid atrial fib over night. Now on amiodarone  RESP- VDRF likely secondary to aspiration pneumonia secondary to delirium  On full vent support- moderate hypercarbia  Vent per CCM  Large air leak with SQ emphysema requiring a second CT last night  Keep both CT to suction for now  On vanco and ceftaz  RENAL- AKI- creatinine stable overnight  ENDO- CBG mildly elevated  HEME- Hct 22- transfuse  Remains critically ill with multiple organ system failure- family at bedside   LOS: 5 days    [name] 08/04/2017

## 2017-08-04 NOTE — Progress Notes (Addendum)
Pharmacy Antibiotic Note  Kathleen Guerrero is a 71 y.o. female admitted on 07/30/2017 with right upper lobe nodule now s/p VATS.  Pharmacy has been consulted for vancomycin and fortaz dosing empirically for fever. -WBC= 15.9, afeb, SCr= 1.99 (up), CrCl ~ 30 -PCT= 0.29  Plan: -No vancomycin changes now (noted for possible d/c in am) -Change fortaz to 1gm IV q24hr -Will follow renal function, cultures and clinical progress   Height: 5' 2.75" (159.4 cm) Weight: 189 lb (85.7 kg) IBW/kg (Calculated) : 51.83  Temp (24hrs), Avg:98.5 F (36.9 C), Min:97.7 F (36.5 C), Max:98.9 F (37.2 C)   Recent Labs Lab 08/02/17 0038 08/02/17 0418 08/03/17 0353 08/03/17 1200 08/03/17 1605 08/03/17 2047 08/03/17 2225 08/04/17 0417 08/04/17 1255  WBC  --  20.1* 22.9*  --  24.5* 17.7*  --  15.9*  --   CREATININE  --  0.77 1.69*  --  2.04* 2.01* 1.90* 1.99*  --   LATICACIDVEN 0.7 0.9 0.8  --   --   --   --   --  1.3  VANCOTROUGH  --   --   --  29*  --   --   --   --   --     Estimated Creatinine Clearance: 27.5 mL/min (A) (by C-G formula based on SCr of 1.99 mg/dL (H)).    No Known Allergies  Antimicrobials this admission: Vanc 9/28>> Tressie Ellis 9/28>>  Microbiology results: 9/28 bldx2:ngtd 9/28 TA: normal flora  Thank you for allowing pharmacy to be a part of this patient's care.  Hildred Laser, Pharm D 08/04/2017 4:50 PM

## 2017-08-04 NOTE — Progress Notes (Signed)
..   Name: Kathleen Guerrero MRN: [id number] DOB: [birth year]    ADMISSION DATE:  07/30/2017 CONSULTATION DATE:  08/01/17  REFERRING MD :  Roxan Hockey MD  CHIEF COMPLAINT:    BRIEF PATIENT DESCRIPTION: 71 yr old female with PMHx GERD, hiatal hernia, 50 pack yr smoker, HTN, HLD, CVA presented for RUL GGO on CT of the chest 13x9x62mm in size which showed increase in size to 13x20 mm. POD 2 s/p Right sided VATS with RUL lobectomy. Developed acute respiratory distress at 10:45 PM accompanied with new crepitus over right chest wall. S/p intubated and now on PRVC.   SIGNIFICANT EVENTS  9/26> Right sided VATS s/p RUL Lobectomy.  9/28 >Acute hypoxic respiratory failure with new subcutaneous emphysema>intubated /vent   STUDIES:  PFTs - Pulmonary function tests November 2017 FVC 3.19 (100%) FEV1 2.36 (97%) CT Chest 07/09/17> interval progression RUL nodule 6mm to 66mm   Cultures :  Sputum 9/28 >>normal flora  BC 9/28 >>  ABX:  Vanc 9/30>>> Ceftaz 9/30>>>  SUBJECTIVE:  AFib with RVR overnight.  BP remains labile, now on levophed 67mcg.  Easily agitated.    VITAL SIGNS: Temp:  [97.7 F (36.5 C)-98.9 F (37.2 C)] 98.6 F (37 C) (10/01 0724) Pulse Rate:  [67-164] 89 (10/01 0724) Resp:  [15-31] 28 (10/01 0715) BP: (53-187)/(33-150) 133/60 (10/01 0724) SpO2:  [92 %-100 %] 100 % (10/01 0724) Arterial Line BP: (51-180)/(35-84) 68/61 (10/01 0715) FiO2 (%):  [50 %-60 %] 50 % (10/01 0724)  PHYSICAL EXAMINATION: General: chronically ill appearing female, sedated on vent  Neuro: sedated with intermittent agitation, current RASS -2. F/c per RN HEENT: ETT, mm moist  Cardiovascular: s1s2 irreg, intermittent AFib  Lungs: resps even non labored on vent, diminished throughout, few scattered rhonchi, R CT x 2, +airleak , +crepitus  Abdomen:  Soft ,+BS  Skin:  No sig edema, Dry /no rash .   Recent Labs Lab 08/03/17 1605 08/03/17 2047 08/03/17 2225 08/04/17 0417  NA 135 134* 137 136  K 4.0  3.5 3.8 3.6  CL 105 105 104 108  CO2 22 23  --  23  BUN 14 16 20 17   CREATININE 2.04* 2.01* 1.90* 1.99*  GLUCOSE 153* 172* 151* 173*    Recent Labs Lab 08/03/17 1605 08/03/17 2047 08/03/17 2225 08/04/17 0417  HGB 8.0* 7.1* 8.8* 7.2*  HCT 25.1* 22.2* 26.0* 22.1*  WBC 24.5* 17.7*  --  15.9*  PLT 318 286  --  303   Dg Chest Port 1 View  Result Date: 08/04/2017 CLINICAL DATA:  Respiratory failure EXAM: PORTABLE CHEST 1 VIEW COMPARISON:  Yesterday FINDINGS: Endotracheal tube tip 7 mm above the carina. An orogastric tube reaches the stomach. Right IJ central line with tip at the upper cavoatrial junction. Postoperative chest with distorted mediastinum that is stable; no ascending aortic aneurysm on recent chest CT. There are 2 right-sided chest tubes with small right apical pneumothorax measuring 3 rib interspaces. Extensive soft tissue emphysema, question decrease. Stable cardiomegaly. Generalized interstitial opacity. Implantable loop recorder over the left chest. Artifact from EKG leads over the chest. IMPRESSION: 1. Small right apical pneumothorax. Question decreasing soft tissue emphysema. 2. Endotracheal tube tip measures 7 mm above the carina. Otherwise stable hardware positioning. 3. Stable lung aeration. Electronically Signed   By: Monte Fantasia M.D.   On: 08/04/2017 07:45   Dg Chest Port 1 View  Result Date: 08/03/2017 CLINICAL DATA:  Pneumothorax EXAM: PORTABLE CHEST 1 VIEW COMPARISON:  Multiple chest radiographs performed on  08/03/2017, the most recent of which was obtained at 3:59 p.m. FINDINGS: Slight retraction of endotracheal tube, now 2.5 cm below the level of the clavicular heads. Right IJ central venous catheter is unchanged in position. A second chest tube has been placed with tip near the right lung apex. Severe subcutaneous emphysema persists, limiting assessment of the lungs. There is increased opacity in the right parahilar region. I am not able to visualize a  pneumothorax. No pleural effusion. Unchanged cardiomediastinal contours. IMPRESSION: 1. Placement of a second chest tube with tip near the right lung apex. 2. Persistent severe subcutaneous emphysema, obscuring the lungs. I am not able to visualize a pneumothorax. Noncontrast chest CT would be better able to assess for the presence of persistent pneumothorax. 3. Increased opacity in the right parahilar region. Electronically Signed   By: Ulyses Jarred M.D.   On: 08/03/2017 19:34   Dg Chest Port 1 View  Result Date: 08/03/2017 CLINICAL DATA:  Pneumothorax EXAM: PORTABLE CHEST 1 VIEW COMPARISON:  Chest radiograph 08/03/2017 at 5:38 a.m. FINDINGS: There is severe subcutaneous emphysema that the obscures detail the lungs. Endotracheal tube tip remains below the level of the clavicles, but above the carina. A could be retracted by 3.8 cm. Right-sided chest tube tip is near the right lung apex. No pleural effusion. No pneumothorax is visualized. Unchanged right IJ central venous catheter. IMPRESSION: Persistent severe subcutaneous emphysema, obscuring pulmonary detail. No visible pneumothorax. Endotracheal tube could be retracted by approximately 3.8 cm. Electronically Signed   By: Ulyses Jarred M.D.   On: 08/03/2017 19:31   Dg Chest Port 1 View  Result Date: 08/03/2017 CLINICAL DATA:  New endotracheal tube placement. EXAM: PORTABLE CHEST 1 VIEW COMPARISON:  1 hour prior. FINDINGS: Endotracheal tube tip just distal to the clavicular heads, the carina is not well visualized. Enteric tube remains in place. Right internal jugular central venous catheter tip in the mid SVC. Right chest tube in place. Previous right pneumothorax is not confidently visualized. Massive subcutaneous emphysema again seen. Mild leftward mediastinal shift which is similar to prior. IMPRESSION: 1. Endotracheal tube tip just distal clavicular heads, above the carina which is not well-defined. 2. Massive subcutaneous emphysema. Right  pneumothorax on prior exam is not confidently visualized, obscured by subcutaneous emphysema. Mild leftward mediastinal shift persists. Electronically Signed   By: Jeb Levering M.D.   On: 08/03/2017 06:12   Dg Chest Port 1 View  Result Date: 08/03/2017 CLINICAL DATA:  Respiratory failure. EXAM: PORTABLE CHEST 1 VIEW COMPARISON:  Radiograph yesterday at 0523 hour FINDINGS: Progressive massive subcutaneous emphysema which obscures evaluation. Endotracheal tube in place, carina is not well-defined. Right central line and endotracheal tube remain in place. Suspect increasing size of right pneumothorax, however difficult to differentiate from adjacent overlying monitoring devices. Right chest tube remains in place. Leftward mediastinal shift may be due to patient rotation or tension. IMPRESSION: 1. Suspect worsening right pneumothorax, obscured by overlying monitoring devices and massive subcutaneous emphysema. Leftward mediastinal shift from prior exam may be due to tension or patient rotation. 2. Increased subcutaneous emphysema throughout the chest. 3. Endotracheal tube, enteric tube, and right central line remain in place. These results will be called to the ordering clinician or representative by the Radiologist Assistant, and communication documented in the PACS or zVision Dashboard. Electronically Signed   By: Jeb Levering M.D.   On: 08/03/2017 05:17     ASSESSMENT / PLAN:   PULMONARY: Acute hypoxic respiratory failure - s/p R VATS and wedge resection  of AdenoCa  Hx OSA  Difficult airway S/p Right sided VATS with wedge resection 9/26  well differentiated AdenoCA Stage Ia- pathology of 2 cm RUL mass  respiratory acidosis  Sub-q emphysema  Plan  Vent support - 8cc/kg  F/u CXR  F/u ABG Right chest tube to suction @-30 mgmt per CT Surgery Daily SBT  VAP prevention  Wean as able.  Increase Vt to 7cc/kg  CVTS primary  May require additional CT if airleak or ptx worsens    NEURO: Sedation needs on vent  Altered mental status post VATS   Plan  rass goal -1  Continue fentanyl gtt  PRN versed  Daily WUA    CARDIAC: Hypotension -Septic Shock - improving - Lactate /PCT nml . AFib with RVR   Plan  Pressors w/ Levophed wean for MAP >65  Cont stress dose steroids (started 9/29 )  Continue amiodarone gtt  Trend troponin  F/u lactate, pct today - ?remains on mod dose pressors  Check CVP    ID: Sepsis Possible HCAP   Plan  Cx pending  Cont Vanc /Fortaz Check pct  Can likely d/c vanc in am if cultures remain neg    Endocrine: Hyperglycemia   Plan  ICU Glycemic protocol    GI: Coffee ground material in OG ? GIB - improved.  Plan  NPO Add TF 10/1 Increase PPI Twice daily   Holding Lovenox   Heme: Anemia  Hgb trend 13.6->10.2->9.5->8.5->7.2 >8.4>8.4  Per OR report 900cc Blood loss- received 2 units PRBCs in OR 9/26   s/p 1 u PRBC 9/28   Plan  If Hgb <7 transfuse PRBCs or per CT Surgery's orders SCDs for VTE ppx   RENAL Acute renal Failure in setting of sepsis /hypotension    Plan  I/O  Electrolyte replacement as needed bmet in am  Avoid nephrotoxins if able   Family:  No family at bedside 10/1   Nickolas Madrid, NP 08/04/2017  10:55 AM Pager: [phone] or [phone]  Attending Note:  71 year old female with stage I adenocarcinoma who presents to PCCM for vent management.  CVTS took patient to the OR and placed a second chest tube.  Patient with respiratory acidosis on labs this AM.  I reviewed CXR myself, apical PTX noted.  On exam, coarse BS diffusely.  Will change to PCV, decreased leak with PCV.  ABG f/u at noon.  Begin TF.  Unable to use anti-psychotics due to QTc of 0.52.  Add precedex.  Double versed.  Would love to extubate to avoid needing surgical interventions for BPF but unable to today.  PCCM will continue to follow.  The patient is critically ill with multiple organ systems failure and requires  high complexity decision making for assessment and support, frequent evaluation and titration of therapies, application of advanced monitoring technologies and extensive interpretation of multiple databases.   Critical Care Time devoted to patient care services described in this note is  35  Minutes. This time reflects time of care of this signee Dr Jennet Maduro. This critical care time does not reflect procedure time, or teaching time or supervisory time of PA/NP/Med student/Med Resident etc but could involve care discussion time.  Rush Farmer, M.D. [facility] Pulmonary/Critical Care Medicine. Pager: [phone]. After hours pager: [phone].

## 2017-08-04 NOTE — Progress Notes (Signed)
[facility] Progress Note Patient Name: Kathleen Guerrero DOB: 13-Mar-1946 MRN: [id number]   Date of Service  08/04/2017  HPI/Events of Note  Phos < 1.0  eICU Interventions  K Phos ordered     Intervention Category Intermediate Interventions: Electrolyte abnormality - evaluation and management  Simonne Maffucci 08/04/2017, 9:53 PM

## 2017-08-04 NOTE — Progress Notes (Signed)
Nutrition Follow-up  DOCUMENTATION CODES:   Obesity unspecified  INTERVENTION:   Vital High Protein @ 50 mL/hr. (1200 ml/day) Provides 1200 kcal, 105 grams of protein, and 1003 mL free water.    NUTRITION DIAGNOSIS:   Inadequate oral intake related to inability to eat as evidenced by NPO status. Ongoing.   GOAL:   Provide needs based on ASPEN/SCCM guidelines Progressing.   MONITOR:   Vent status, Weight trends, Labs, Skin  REASON FOR ASSESSMENT:   Consult Enteral/tube feeding initiation and management  ASSESSMENT:   71 yr old female with PMHx GERD, hiatal hernia, 50 pack yr smoker, HTN, HLD, CVA presented for RUL GGO on CT of the chest 13x9x30mm in size which showed increase in size to 13x20 mm. POD 2 s/p Right sided VATS with RUL lobectomy. Developed acute respiratory distress at 10:45 PM on 9/28 accompanied with new crepitus over right chest wall. S/p intubated and now on PRVC.   9/28 intubated on ventilator support Pathology: well differentiated adeno Ca stage 1a OG with coffee ground material improved, plan to start TF today Cortrak placed today, spoke with RN at bedside.   R CT x 2:190 ml out x 24 hours   Medications reviewed and include: dulcolax, solu-cortef, senokot-s, levophed Labs reviewed CBG's: 193 MAP: 80  Diet Order:  Diet NPO time specified  Skin:  Wound (see comment) (R chest incision from 9/26)  Last BM:  PTA  Height:   Ht Readings from Last 1 Encounters:  07/31/17 5' 2.75" (1.594 m)    Weight:   Wt Readings from Last 1 Encounters:  07/31/17 189 lb (85.7 kg)    Ideal Body Weight:  51.7 kg  BMI:  Body mass index is 33.75 kg/m.  Estimated Nutritional Needs:   Kcal:  2979-8921 (12-15 kcal/kg)  Protein:  103 grams (2 grams/kg IBW)  Fluid:  >/= 1.5 L/day  EDUCATION NEEDS:   No education needs identified at this time  [place], [place], [place] Pager [phone] After Hours Pager

## 2017-08-04 NOTE — Progress Notes (Signed)
Dr. Lamonte Sakai made aware of ABG results. FIO2 increased to 60%

## 2017-08-04 NOTE — Progress Notes (Signed)
eLink Physician-Brief Progress Note Patient Name: Kathleen Guerrero DOB: 01-26-1946 MRN: [id number]   Date of Service  08/04/2017  HPI/Events of Note  AF/RVR with associated hypotension earlier.  Placed on Amio bolus/gtt.  Now with persistent increases in HR to 170s/180s.  Labs show K of 3.9 and Mag of 1.9.  Trop of 0.19 but this is down from 0.32.  Hgb of 7.1 down form 14 earlier.  eICU Interventions  Plan: Additional amio bolus Replace mag Nurse to contact CVTS regarding if they would like to transfuse patient.     Intervention Category Intermediate Interventions: Arrhythmia - evaluation and management  Torres Hardenbrook 08/04/2017, 12:23 AM

## 2017-08-04 NOTE — Progress Notes (Signed)
Cortrak Tube Team Note:  Consult received to place a Cortrak feeding tube.   A 10 F Cortrak tube was placed in the L nare and secured with a nasal bridle at 87 cm. Per the Cortrak monitor reading the tube tip is just post pyloric.   X-ray is required, abdominal x-ray has been ordered by the Cortrak team. Please confirm tube placement before using the Cortrak tube.   If the tube becomes dislodged please keep the tube and contact the Cortrak team at [email or web address] (password [email or web address]) for replacement.  If after hours and replacement cannot be delayed, place a NG tube and confirm placement with an abdominal x-ray.    [place], [place], [place] Pager [phone] After Hours Pager

## 2017-08-04 NOTE — Progress Notes (Signed)
Peripherally Inserted Central Catheter/Midline Placement  The IV Nurse has discussed with the patient and/or persons authorized to consent for the patient, the purpose of this procedure and the potential benefits and risks involved with this procedure.  The benefits include less needle sticks, lab draws from the catheter, and the patient may be discharged home with the catheter. Risks include, but not limited to, infection, bleeding, blood clot (thrombus formation), and puncture of an artery; nerve damage and irregular heartbeat and possibility to perform a PICC exchange if needed/ordered by physician.  Alternatives to this procedure were also discussed.  Bard Power PICC patient education guide, fact sheet on infection prevention and patient information card has been provided to patient /or left at bedside.    PICC/Midline Placement Documentation     Telephone Consent   Kathleen Guerrero 08/04/2017, 10:12 AM

## 2017-08-04 NOTE — Progress Notes (Signed)
CT surgery p.m. Rounds  Sedated on ventilator with good oxygenation Positive airleak from medial chest tube Stable hemodynamics

## 2017-08-05 ENCOUNTER — Inpatient Hospital Stay (HOSPITAL_COMMUNITY): Payer: Medicare HMO

## 2017-08-05 LAB — CBC
HCT: 25.4 % — ABNORMAL LOW (ref 36.0–46.0)
HEMOGLOBIN: 8.4 g/dL — AB (ref 12.0–15.0)
MCH: 28.6 pg (ref 26.0–34.0)
MCHC: 33.1 g/dL (ref 30.0–36.0)
MCV: 86.4 fL (ref 78.0–100.0)
Platelets: 278 10*3/uL (ref 150–400)
RBC: 2.94 MIL/uL — AB (ref 3.87–5.11)
RDW: 16.7 % — ABNORMAL HIGH (ref 11.5–15.5)
WBC: 10.6 10*3/uL — AB (ref 4.0–10.5)

## 2017-08-05 LAB — BLOOD GAS, ARTERIAL
ACID-BASE DEFICIT: 1.4 mmol/L (ref 0.0–2.0)
Acid-base deficit: 2.1 mmol/L — ABNORMAL HIGH (ref 0.0–2.0)
Bicarbonate: 22.6 mmol/L (ref 20.0–28.0)
Bicarbonate: 21.8 mmol/L (ref 20.0–28.0)
DRAWN BY: 41422
FIO2: 40
FIO2: 40
LHR: 30 {breaths}/min
O2 SAT: 95.9 %
O2 Saturation: 97.2 %
PATIENT TEMPERATURE: 98.6
PCO2 ART: 30.5 mmHg — AB (ref 32.0–48.0)
PEEP: 5 cmH2O
PEEP: 5 cmH2O
PH ART: 7.353 (ref 7.350–7.450)
PH ART: 7.468 — AB (ref 7.350–7.450)
PIP: 20 cmH2O
PIP: 20 cmH2O
PO2 ART: 85.1 mmHg (ref 83.0–108.0)
PRESSURE CONTROL: 30 cmH2O
Patient temperature: 98.6
RATE: 30 resp/min
pCO2 arterial: 41.7 mmHg (ref 32.0–48.0)
pO2, Arterial: 73 mmHg — ABNORMAL LOW (ref 83.0–108.0)

## 2017-08-05 LAB — COMPREHENSIVE METABOLIC PANEL
ALBUMIN: 1.9 g/dL — AB (ref 3.5–5.0)
ALK PHOS: 61 U/L (ref 38–126)
ALT: 15 U/L (ref 14–54)
AST: 18 U/L (ref 15–41)
Anion gap: 8 (ref 5–15)
BUN: 22 mg/dL — AB (ref 6–20)
CALCIUM: 7.1 mg/dL — AB (ref 8.9–10.3)
CO2: 22 mmol/L (ref 22–32)
CREATININE: 1.65 mg/dL — AB (ref 0.44–1.00)
Chloride: 107 mmol/L (ref 101–111)
GFR calc Af Amer: 36 mL/min — ABNORMAL LOW (ref >60)
GFR, EST NON AFRICAN AMERICAN: 31 mL/min — AB (ref >60)
GLUCOSE: 145 mg/dL — AB (ref 65–99)
POTASSIUM: 3.8 mmol/L (ref 3.5–5.1)
Sodium: 137 mmol/L (ref 135–145)
TOTAL PROTEIN: 4.8 g/dL — AB (ref 6.5–8.1)
Total Bilirubin: 0.6 mg/dL (ref 0.3–1.2)

## 2017-08-05 LAB — GLUCOSE, CAPILLARY
GLUCOSE-CAPILLARY: 121 mg/dL — AB (ref 65–99)
GLUCOSE-CAPILLARY: 97 mg/dL (ref 65–99)
GLUCOSE-CAPILLARY: 124 mg/dL — AB (ref 65–99)
Glucose-Capillary: 132 mg/dL — ABNORMAL HIGH (ref 65–99)
Glucose-Capillary: 143 mg/dL — ABNORMAL HIGH (ref 65–99)
Glucose-Capillary: 175 mg/dL — ABNORMAL HIGH (ref 65–99)

## 2017-08-05 LAB — TYPE AND SCREEN
ABO/RH(D): A POS
Antibody Screen: NEGATIVE
UNIT DIVISION: 0
UNIT DIVISION: 0
Unit division: 0

## 2017-08-05 LAB — BPAM RBC
BLOOD PRODUCT EXPIRATION DATE: 201809281457
BLOOD PRODUCT EXPIRATION DATE: 201810072359
BLOOD PRODUCT EXPIRATION DATE: 201810092359
ISSUE DATE / TIME: 201809282359
ISSUE DATE / TIME: 201810011200
ISSUE DATE / TIME: 201809281057
UNIT TYPE AND RH: 6200
UNIT TYPE AND RH: 6200
Unit Type and Rh: 6200

## 2017-08-05 LAB — PHOSPHORUS: Phosphorus: 2.5 mg/dL (ref 2.5–4.6)

## 2017-08-05 LAB — PROCALCITONIN: PROCALCITONIN: 0.16 ng/mL

## 2017-08-05 LAB — MAGNESIUM: Magnesium: 2.2 mg/dL (ref 1.7–2.4)

## 2017-08-05 MED ORDER — IOPAMIDOL (ISOVUE-300) INJECTION 61%
INTRAVENOUS | Status: AC
  Administered 2017-08-05: 10 mL
  Filled 2017-08-05: qty 150

## 2017-08-05 MED ORDER — IOPAMIDOL (ISOVUE-300) INJECTION 61%
50.0000 mL | Freq: Once | INTRAVENOUS | Status: AC | PRN
  Administered 2017-08-05: 10 mL

## 2017-08-05 MED ORDER — VITAL HIGH PROTEIN PO LIQD
1000.0000 mL | ORAL | Status: DC
  Administered 2017-08-06 – 2017-08-10 (×6): 1000 mL
  Filled 2017-08-05 (×3): qty 1000

## 2017-08-05 MED ORDER — POTASSIUM CHLORIDE 20 MEQ/15ML (10%) PO SOLN
40.0000 meq | Freq: Three times a day (TID) | ORAL | Status: AC
  Administered 2017-08-05 (×2): 40 meq
  Filled 2017-08-05 (×2): qty 30

## 2017-08-05 MED ORDER — ENOXAPARIN SODIUM 40 MG/0.4ML ~~LOC~~ SOLN
40.0000 mg | SUBCUTANEOUS | Status: DC
  Administered 2017-08-05 – 2017-08-20 (×16): 40 mg via SUBCUTANEOUS
  Filled 2017-08-05 (×16): qty 0.4

## 2017-08-05 MED ORDER — FUROSEMIDE 10 MG/ML IJ SOLN
40.0000 mg | Freq: Four times a day (QID) | INTRAMUSCULAR | Status: AC
  Administered 2017-08-05 (×3): 40 mg via INTRAVENOUS
  Filled 2017-08-05 (×4): qty 4

## 2017-08-05 MED ORDER — LIDOCAINE VISCOUS 2 % MT SOLN
5.0000 mL | Freq: Once | OROMUCOSAL | Status: AC
  Administered 2017-08-05: 3 mL via OROMUCOSAL
  Filled 2017-08-05: qty 15

## 2017-08-05 MED ORDER — DEXTROSE 5 % IV SOLN
1.0000 g | Freq: Two times a day (BID) | INTRAVENOUS | Status: DC
  Administered 2017-08-05 – 2017-08-10 (×9): 1 g via INTRAVENOUS
  Filled 2017-08-05 (×11): qty 1

## 2017-08-05 NOTE — Progress Notes (Signed)
6 Days Post-Op Procedure(s) (LRB): VIDEO ASSISTED THORACOSCOPY (VATS)/WEDGE RESECTION (Right) LOBECTOMY (Right) Subjective: Intubated, sedated, was agitated earlier  Objective: Vital signs in last 24 hours: Temp:  [98.2 F (36.8 C)-98.8 F (37.1 C)] 98.7 F (37.1 C) (10/02 0400) Pulse Rate:  [64-147] 70 (10/02 0630) Cardiac Rhythm: Normal sinus rhythm;Sinus tachycardia (10/02 0400) Resp:  [12-32] 26 (10/02 0630) BP: (93-160)/(44-106) 130/71 (10/02 0630) SpO2:  [92 %-100 %] 100 % (10/02 0630) Arterial Line BP: (56-186)/(45-152) 127/78 (10/02 0630) FiO2 (%):  [40 %] 40 % (10/02 0309) Weight:  [205 lb 14.6 oz (93.4 kg)] 205 lb 14.6 oz (93.4 kg) (10/02 0530)  Hemodynamic parameters for last 24 hours: CVP:  [4 mmHg-15 mmHg] 14 mmHg  Intake/Output from previous day: 10/01 0701 - 10/02 0700 In: 3137.5 [I.V.:1278.8; NG/GT:839.6; IV Piggyback:1019.1] Out: 2145 [Urine:1215; Emesis/NG output:450; Chest Tube:480] Intake/Output this shift: No intake/output data recorded.  General appearance: intubated Neurologic: agitation persists, moves all 4 ext Heart: regular rate and rhythm Lungs: rhonchi bilaterally Abdomen: normal findings: soft, non-tender + air leak from both tubes  Lab Results:  Recent Labs  08/04/17 0417 08/05/17 0336  WBC 15.9* 10.6*  HGB 7.2* 8.4*  HCT 22.1* 25.4*  PLT 303 278   BMET:  Recent Labs  08/04/17 0417 08/05/17 0336  NA 136 137  K 3.6 3.8  CL 108 107  CO2 23 22  GLUCOSE 173* 145*  BUN 17 22*  CREATININE 1.99* 1.65*  CALCIUM 6.9* 7.1*    PT/INR: No results for input(s): LABPROT, INR in the last 72 hours. ABG    Component Value Date/Time   PHART 7.468 (H) 08/05/2017 0406   HCO3 21.8 08/05/2017 0406   TCO2 28 08/04/2017 0424   ACIDBASEDEF 1.4 08/05/2017 0406   O2SAT 95.9 08/05/2017 0406   CBG (last 3)   Recent Labs  08/04/17 1548 08/04/17 2309 08/05/17 0331  GLUCAP 124* 136* 143*    Assessment/Plan: S/P Procedure(s)  (LRB): VIDEO ASSISTED THORACOSCOPY (VATS)/WEDGE RESECTION (Right) LOBECTOMY (Right) -  NEURO- postop delirium. Trying to minimize sedation as much as possible without increasing risk. Was just sedated this AM  CV- maintaining SR on amiodarone  RESP/ID- VDRF presumed aspiration pneumonia- CXR shows likely LLL infiltrate  On vanco and ceftazidime. WBC and procalcitonin trending down  Keep CT to suction  RENAL- creatinine improving, lytes OK  ENDO- CBG reasonably well controlled  DVT prophylaxis- SCD in place. Enoxaparin was held Sunday due to concern for possible GIB. Will restart enoxaparin due to high risk of DVT and follow closely  GI/ Nutrition- moderate protein calorie malnutrition- will resume TF once Cor track is working  Remains critically ill with multiple organ system dysfunction, but has improved in some respects over the past 48 hours. Mental status remains primary concern   LOS: 6 days    [name] 08/05/2017

## 2017-08-05 NOTE — Progress Notes (Signed)
Pt traveling to CT with Airline pilot.

## 2017-08-05 NOTE — Progress Notes (Signed)
Report received from [place], [place].  I will now assume care of patient. Agree with previous assessments.

## 2017-08-05 NOTE — Progress Notes (Signed)
      [address]       [place] [address]             [phone]      Still sedated and intubated  Failed SBT earlier today- apparently family was very upset  BP (!) 118/58   Pulse 79   Temp 99.2 F (37.3 C) (Oral)   Resp 20   Ht 5' 2.75" (1.594 m)   Wt 205 lb 14.6 oz (93.4 kg)   SpO2 99%   BMI 36.77 kg/m    Intake/Output Summary (Last 24 hours) at 08/05/17 1909 Last data filed at 08/05/17 1700  Gross per 24 hour  Intake          2925.38 ml  Output             2865 ml  Net            60.38 ml   Tube feeds initiated  Explained to family that it may be several more days before she can complete a SBT and be extubated.  Overall has had a fairly stable day  Remo Lipps C. Roxan Hockey, MD [facility] [phone]

## 2017-08-05 NOTE — Progress Notes (Signed)
..   Name: Kathleen Guerrero MRN: [id number] DOB: 25-Apr-1946    ADMISSION DATE:  07/30/2017 CONSULTATION DATE:  08/01/17  REFERRING MD :  Roxan Hockey MD  CHIEF COMPLAINT:    BRIEF PATIENT DESCRIPTION: 71 yr old female with PMHx GERD, hiatal hernia, 50 pack yr smoker, HTN, HLD, CVA presented for RUL GGO on CT of the chest 13x9x23m in size which showed increase in size to 13x20 mm. POD 2 s/p Right sided VATS with RUL lobectomy. Developed acute respiratory distress at 10:45 PM accompanied with new crepitus over right chest wall. S/p intubated and now on PRVC.   SIGNIFICANT EVENTS  9/26> Right sided VATS s/p RUL Lobectomy.  9/28 >Acute hypoxic respiratory failure with new subcutaneous emphysema>intubated /vent   STUDIES:  PFTs - Pulmonary function tests November 2017 FVC 3.19 (100%) FEV1 2.36 (97%) CT Chest 07/09/17> interval progression RUL nodule 141mto 2061m Cultures :  Sputum 9/28 >>normal flora  BC 9/28 >>   ABX:  Vanc 9/30>>> Ceftaz 9/30>>>  SUBJECTIVE:  AFib with RVR overnight > started on amio. Chest tubes increased to -40cm suction overnight.  Both with air leak this AM, lateral moreso than medial.   VITAL SIGNS: Temp:  [98.2 F (36.8 C)-98.8 F (37.1 C)] 98.7 F (37.1 C) (10/02 0700) Pulse Rate:  [64-147] 70 (10/02 0630) Resp:  [12-32] 26 (10/02 0630) BP: (93-160)/(49-106) 130/71 (10/02 0630) SpO2:  [92 %-100 %] 100 % (10/02 0630) Arterial Line BP: (61-186)/(45-152) 127/78 (10/02 0630) FiO2 (%):  [40 %] 40 % (10/02 0309) Weight:  [93.4 kg (205 lb 14.6 oz)] 93.4 kg (205 lb 14.6 oz) (10/02 0530)  PHYSICAL EXAMINATION:  General: chronically ill appearing female, sedated on vent  Neuro: sedated, current RASS -2 HEENT: ETT, mm moist  Cardiovascular: s1s2 irreg, intermittent AFib  Lungs: resps even non labored on vent, diminished throughout, few scattered rhonchi, R CT x 2, +airleak , +crepitus  Abdomen:  Soft ,+BS  Skin:  Dry /no rash, crepitus throughout neck,  chest, arms  Recent Labs Lab 08/03/17 2047 08/03/17 2225 08/04/17 0417 08/05/17 0336  NA 134* 137 136 137  K 3.5 3.8 3.6 3.8  CL 105 104 108 107  CO2 23  --  23 22  BUN 16 20 17  22*  CREATININE 2.01* 1.90* 1.99* 1.65*  GLUCOSE 172* 151* 173* 145*    Recent Labs Lab 08/03/17 2047 08/03/17 2225 08/04/17 0417 08/05/17 0336  HGB 7.1* 8.8* 7.2* 8.4*  HCT 22.2* 26.0* 22.1* 25.4*  WBC 17.7*  --  15.9* 10.6*  PLT 286  --  303 278   Dg Chest Port 1 View  Result Date: 08/05/2017 CLINICAL DATA:  Intubated patient, respiratory failure, chest tube treatment of pneumothorax on the right. EXAM: PORTABLE CHEST 1 VIEW COMPARISON:  Portable chest x-ray of August 04, 2017 FINDINGS: A large amount of subcutaneous emphysema is present. The pleural line demonstrated on yesterday's study in the right apex is not clearly evident today. The 2 right-sided chest tubes are in stable position. The left lung is well-expanded. The interstitial markings are coarse in the left lung. There is stable right hilar soft tissue prominence. The heart is normal in size. There is calcification in the wall of the thoracic aorta. The esophagogastric tube and feeding tube tips project below the inferior margin of the image. The endotracheal tube tip lies approximately 1.5 cm above the carina. The right internal jugular venous catheter tip projects over the midportion of the SVC. IMPRESSION: Persistent extensive  subcutaneous emphysema which limits the sensitivity of the study. No definite residual right-sided pneumothorax. Coarse interstitial densities on the left are in part due to the overlying subcutaneous emphysema but there is likely mild interstitial edema or infiltrate in the left mid and lower lung. There is persistent left lower lobe atelectasis. Low positioning of the endotracheal tube. Withdrawal by 2 cm is recommended to avoid accidental mainstem bronchus intubation with patient movement. The other support tubes are in  reasonable position. Thoracic aortic atherosclerosis. Electronically Signed   By: David  Martinique M.D.   On: 08/05/2017 07:50   Dg Chest Port 1 View  Result Date: 08/04/2017 CLINICAL DATA:  Respiratory failure EXAM: PORTABLE CHEST 1 VIEW COMPARISON:  Yesterday FINDINGS: Endotracheal tube tip 7 mm above the carina. An orogastric tube reaches the stomach. Right IJ central line with tip at the upper cavoatrial junction. Postoperative chest with distorted mediastinum that is stable; no ascending aortic aneurysm on recent chest CT. There are 2 right-sided chest tubes with small right apical pneumothorax measuring 3 rib interspaces. Extensive soft tissue emphysema, question decrease. Stable cardiomegaly. Generalized interstitial opacity. Implantable loop recorder over the left chest. Artifact from EKG leads over the chest. IMPRESSION: 1. Small right apical pneumothorax. Question decreasing soft tissue emphysema. 2. Endotracheal tube tip measures 7 mm above the carina. Otherwise stable hardware positioning. 3. Stable lung aeration. Electronically Signed   By: Monte Fantasia M.D.   On: 08/04/2017 07:45   Dg Chest Port 1 View  Result Date: 08/03/2017 CLINICAL DATA:  Pneumothorax EXAM: PORTABLE CHEST 1 VIEW COMPARISON:  Multiple chest radiographs performed on 08/03/2017, the most recent of which was obtained at 3:59 p.m. FINDINGS: Slight retraction of endotracheal tube, now 2.5 cm below the level of the clavicular heads. Right IJ central venous catheter is unchanged in position. A second chest tube has been placed with tip near the right lung apex. Severe subcutaneous emphysema persists, limiting assessment of the lungs. There is increased opacity in the right parahilar region. I am not able to visualize a pneumothorax. No pleural effusion. Unchanged cardiomediastinal contours. IMPRESSION: 1. Placement of a second chest tube with tip near the right lung apex. 2. Persistent severe subcutaneous emphysema, obscuring the  lungs. I am not able to visualize a pneumothorax. Noncontrast chest CT would be better able to assess for the presence of persistent pneumothorax. 3. Increased opacity in the right parahilar region. Electronically Signed   By: Ulyses Jarred M.D.   On: 08/03/2017 19:34   Dg Chest Port 1 View  Result Date: 08/03/2017 CLINICAL DATA:  Pneumothorax EXAM: PORTABLE CHEST 1 VIEW COMPARISON:  Chest radiograph 08/03/2017 at 5:38 a.m. FINDINGS: There is severe subcutaneous emphysema that the obscures detail the lungs. Endotracheal tube tip remains below the level of the clavicles, but above the carina. A could be retracted by 3.8 cm. Right-sided chest tube tip is near the right lung apex. No pleural effusion. No pneumothorax is visualized. Unchanged right IJ central venous catheter. IMPRESSION: Persistent severe subcutaneous emphysema, obscuring pulmonary detail. No visible pneumothorax. Endotracheal tube could be retracted by approximately 3.8 cm. Electronically Signed   By: Ulyses Jarred M.D.   On: 08/03/2017 19:31   Dg Abd Portable 1v  Result Date: 08/04/2017 CLINICAL DATA:  Check feeding catheter placement EXAM: PORTABLE ABDOMEN - 1 VIEW COMPARISON:  08/02/2017 FINDINGS: Feeding catheter is now noted within the stomach directed towards the pylorus. The tip may actually lie in the duodenal wall. Nasogastric catheter is noted within the stomach. Considerable  subcutaneous emphysema is noted bilaterally but stable. IMPRESSION: Feeding catheter and nasogastric catheter as described. Electronically Signed   By: Inez Catalina M.D.   On: 08/04/2017 16:40     ASSESSMENT / PLAN:   PULMONARY: Acute hypoxic respiratory failure - s/p R VATS and wedge resection of AdenoCa  Hx OSA  Difficult airway S/p Right sided VATS with wedge resection 9/26  well differentiated AdenoCA Stage Ia- pathology of 2 cm RUL mass  Sub-q emphysema  Plan  Vent support - 8cc/kg ABG noted - resp alk, rate dropped from 30 to 24 Daily SBT    VAP prevention  Follow CXR Chest tube management per CVTS  NEURO: Sedation needs on vent  Altered mental status post VATS Plan  rass goal -1  Continue fentanyl gtt / precedex gtt PRN versed  Daily WUA   CARDIAC: Hypotension - Septic Shock - improving - Lactate /PCT nml . AFib with RVR  Plan  Pressors w/ Levophed wean for MAP >65 - off as of AM 10/2. Cont stress dose steroids (started 9/29 )  Continue amiodarone gtt  Trend troponin  Follow CVP  ID: Sepsis Possible HCAP Plan  Cx pending  Cont Vanc /Fortaz Consider d/c vanc in AM if cultures remain neg   Endocrine: Hyperglycemia  Plan  ICU Glycemic protocol    GI: Coffee ground material in OG 10/1 - improved.  Plan  NPO TF added 10/1 - holding this AM given kinked cortrak Increase PPI Twice daily    Heme: Anemia - Per OR report 900cc Blood loss- received 2 units PRBCs in OR 9/26,  s/p 1 u PRBC 9/28  Plan  If Hgb <7 transfuse PRBCs or per CT Surgery's orders SCDs for VTE ppx  RENAL Acute renal Failure in setting of sepsis /hypotension  Plan  I/O  Electrolyte replacement as needed bmet in am  Avoid nephrotoxins if able    CC time: 30 min.   Montey Hora, [facility] Pager: [phone]  or [phone] 08/05/2017, 9:05 AM  Attending Note:  72 year old female with stage one adeno that has a persistent air leak.  Renal function is improving on labs.  On exam, she is arousable but remains sedate.  I reviewed CXR myself, ETT ok and CT in good position.  Will proceed with weaning after stopping fentanyl and decreasing dex by half.  Doubt will extubate today but would like to start exercising patient today.  Proceed with diureses and will realistically attempt extubation in AM.  The patient is critically ill with multiple organ systems failure and requires high complexity decision making for assessment and support, frequent evaluation and titration of therapies,  application of advanced monitoring technologies and extensive interpretation of multiple databases.   Critical Care Time devoted to patient care services described in this note is  35  Minutes. This time reflects time of care of this signee Dr Jennet Maduro. This critical care time does not reflect procedure time, or teaching time or supervisory time of PA/NP/Med student/Med Resident etc but could involve care discussion time.  Rush Farmer, M.D. [facility] Pulmonary/Critical Care Medicine. Pager: [phone]. After hours pager: [phone].

## 2017-08-05 NOTE — Progress Notes (Signed)
This RN took over care at 2330. Upon assessment, patient has crepitus down arms bilaterally. Both CTs to -30cm suction per CVTS note, noted air leak in the lateral CT that has not changed. Pt tolerating vent and settings well. Paged Dr. Prescott Gum to make aware, MD reviewed vent settings, no changes to these; verbal orders to increase suction of both CTs to -40cm suction. Will implement changes and continue to monitor closely.

## 2017-08-06 ENCOUNTER — Inpatient Hospital Stay (HOSPITAL_COMMUNITY): Payer: Medicare HMO

## 2017-08-06 DIAGNOSIS — J810 Acute pulmonary edema: Secondary | ICD-10-CM

## 2017-08-06 LAB — COMPREHENSIVE METABOLIC PANEL
ALK PHOS: 59 U/L (ref 38–126)
ALT: 16 U/L (ref 14–54)
AST: 17 U/L (ref 15–41)
Albumin: 2.2 g/dL — ABNORMAL LOW (ref 3.5–5.0)
Anion gap: 10 (ref 5–15)
BUN: 30 mg/dL — AB (ref 6–20)
CALCIUM: 7.1 mg/dL — AB (ref 8.9–10.3)
CHLORIDE: 107 mmol/L (ref 101–111)
CO2: 25 mmol/L (ref 22–32)
CREATININE: 1.6 mg/dL — AB (ref 0.44–1.00)
GFR, EST AFRICAN AMERICAN: 37 mL/min — AB (ref >60)
GFR, EST NON AFRICAN AMERICAN: 32 mL/min — AB (ref >60)
Glucose, Bld: 157 mg/dL — ABNORMAL HIGH (ref 65–99)
Potassium: 3.1 mmol/L — ABNORMAL LOW (ref 3.5–5.1)
Sodium: 142 mmol/L (ref 135–145)
Total Bilirubin: 0.5 mg/dL (ref 0.3–1.2)
Total Protein: 5.4 g/dL — ABNORMAL LOW (ref 6.5–8.1)

## 2017-08-06 LAB — GLUCOSE, CAPILLARY
GLUCOSE-CAPILLARY: 153 mg/dL — AB (ref 65–99)
GLUCOSE-CAPILLARY: 148 mg/dL — AB (ref 65–99)
GLUCOSE-CAPILLARY: 137 mg/dL — AB (ref 65–99)
GLUCOSE-CAPILLARY: 154 mg/dL — AB (ref 65–99)
Glucose-Capillary: 156 mg/dL — ABNORMAL HIGH (ref 65–99)

## 2017-08-06 LAB — BLOOD GAS, ARTERIAL
Acid-Base Excess: 2.4 mmol/L — ABNORMAL HIGH (ref 0.0–2.0)
Bicarbonate: 26.1 mmol/L (ref 20.0–28.0)
DRAWN BY: 414221
FIO2: 40
O2 SAT: 97.8 %
PATIENT TEMPERATURE: 98.6
PCO2 ART: 38.8 mmHg (ref 32.0–48.0)
PEEP: 5 cmH2O
PH ART: 7.443 (ref 7.350–7.450)
Pressure control: 20 cmH2O
RATE: 30 resp/min
pO2, Arterial: 106 mmHg (ref 83.0–108.0)

## 2017-08-06 LAB — BASIC METABOLIC PANEL
ANION GAP: 10 (ref 5–15)
BUN: 39 mg/dL — ABNORMAL HIGH (ref 6–20)
CALCIUM: 7.3 mg/dL — AB (ref 8.9–10.3)
CO2: 27 mmol/L (ref 22–32)
CREATININE: 1.63 mg/dL — AB (ref 0.44–1.00)
Chloride: 105 mmol/L (ref 101–111)
GFR calc Af Amer: 36 mL/min — ABNORMAL LOW (ref >60)
GFR calc non Af Amer: 31 mL/min — ABNORMAL LOW (ref >60)
Glucose, Bld: 162 mg/dL — ABNORMAL HIGH (ref 65–99)
Potassium: 3.3 mmol/L — ABNORMAL LOW (ref 3.5–5.1)
SODIUM: 142 mmol/L (ref 135–145)

## 2017-08-06 LAB — CBC
HEMATOCRIT: 23.7 % — AB (ref 36.0–46.0)
HEMOGLOBIN: 7.9 g/dL — AB (ref 12.0–15.0)
MCH: 29.2 pg (ref 26.0–34.0)
MCHC: 33.3 g/dL (ref 30.0–36.0)
MCV: 87.5 fL (ref 78.0–100.0)
PLATELETS: 306 10*3/uL (ref 150–400)
RBC: 2.71 MIL/uL — AB (ref 3.87–5.11)
RDW: 17.1 % — ABNORMAL HIGH (ref 11.5–15.5)
WBC: 13.5 10*3/uL — AB (ref 4.0–10.5)

## 2017-08-06 LAB — CULTURE, BLOOD (ROUTINE X 2)
CULTURE: NO GROWTH
CULTURE: NO GROWTH
SPECIAL REQUESTS: ADEQUATE
SPECIAL REQUESTS: ADEQUATE

## 2017-08-06 LAB — POCT I-STAT, CHEM 8
BUN: 33 mg/dL — AB (ref 6–20)
CHLORIDE: 105 mmol/L (ref 101–111)
Calcium, Ion: 0.98 mmol/L — ABNORMAL LOW (ref 1.15–1.40)
Creatinine, Ser: 1.5 mg/dL — ABNORMAL HIGH (ref 0.44–1.00)
Glucose, Bld: 158 mg/dL — ABNORMAL HIGH (ref 65–99)
HEMATOCRIT: 22 % — AB (ref 36.0–46.0)
Hemoglobin: 7.5 g/dL — ABNORMAL LOW (ref 12.0–15.0)
POTASSIUM: 3.4 mmol/L — AB (ref 3.5–5.1)
SODIUM: 146 mmol/L — AB (ref 135–145)
TCO2: 25 mmol/L (ref 22–32)

## 2017-08-06 LAB — MAGNESIUM: Magnesium: 2 mg/dL (ref 1.7–2.4)

## 2017-08-06 LAB — PROCALCITONIN: Procalcitonin: 0.12 ng/mL

## 2017-08-06 LAB — PHOSPHORUS: PHOSPHORUS: 1.7 mg/dL — AB (ref 2.5–4.6)

## 2017-08-06 MED ORDER — POTASSIUM CHLORIDE 20 MEQ/15ML (10%) PO SOLN
40.0000 meq | Freq: Three times a day (TID) | ORAL | Status: AC
Start: 2017-08-06 — End: 2017-08-06
  Administered 2017-08-06 (×2): 40 meq
  Filled 2017-08-06 (×2): qty 30

## 2017-08-06 MED ORDER — ACETAMINOPHEN 160 MG/5ML PO SOLN
650.0000 mg | Freq: Four times a day (QID) | ORAL | Status: AC
  Administered 2017-08-06 – 2017-08-09 (×12): 650 mg via ORAL
  Filled 2017-08-06 (×11): qty 20.3

## 2017-08-06 MED ORDER — FUROSEMIDE 10 MG/ML IJ SOLN
40.0000 mg | Freq: Once | INTRAMUSCULAR | Status: AC
Start: 2017-08-06 — End: 2017-08-06
  Administered 2017-08-06: 40 mg via INTRAVENOUS
  Filled 2017-08-06: qty 4

## 2017-08-06 MED ORDER — AMLODIPINE BESYLATE 10 MG PO TABS
10.0000 mg | ORAL_TABLET | Freq: Every day | ORAL | Status: DC
  Administered 2017-08-06 – 2017-08-20 (×15): 10 mg via ORAL
  Filled 2017-08-06 (×15): qty 1

## 2017-08-06 MED ORDER — K PHOS MONO-SOD PHOS DI & MONO 155-852-130 MG PO TABS
500.0000 mg | ORAL_TABLET | Freq: Three times a day (TID) | ORAL | Status: DC
  Administered 2017-08-06 – 2017-08-20 (×56): 500 mg via ORAL
  Filled 2017-08-06 (×60): qty 2

## 2017-08-06 MED ORDER — AMLODIPINE 1 MG/ML ORAL SUSPENSION: 10.0000 mg | Freq: Every day | ORAL | Status: DC

## 2017-08-06 MED ORDER — AMLODIPINE 1 MG/ML ORAL SUSPENSION
10.0000 mg | Freq: Every day | ORAL | Status: DC
  Filled 2017-08-06: qty 10

## 2017-08-06 MED ORDER — POTASSIUM CHLORIDE 10 MEQ/50ML IV SOLN
10.0000 meq | INTRAVENOUS | Status: AC
  Administered 2017-08-06 (×3): 10 meq via INTRAVENOUS
  Filled 2017-08-06: qty 50

## 2017-08-06 MED ORDER — RISPERIDONE 1 MG/ML PO SOLN
0.5000 mg | Freq: Two times a day (BID) | ORAL | Status: DC
  Administered 2017-08-06 – 2017-08-07 (×3): 0.5 mg
  Filled 2017-08-06 (×3): qty 0.5

## 2017-08-06 MED ORDER — CLONAZEPAM 1 MG PO TABS
1.0000 mg | ORAL_TABLET | Freq: Two times a day (BID) | ORAL | Status: DC
  Administered 2017-08-06 – 2017-08-08 (×6): 1 mg via ORAL
  Filled 2017-08-06 (×6): qty 1

## 2017-08-06 MED ORDER — POTASSIUM CHLORIDE 10 MEQ/50ML IV SOLN
10.0000 meq | INTRAVENOUS | Status: AC
  Administered 2017-08-07 (×3): 10 meq via INTRAVENOUS
  Filled 2017-08-06 (×3): qty 50

## 2017-08-06 MED ORDER — FUROSEMIDE 10 MG/ML IJ SOLN: 40.0000 mg | Freq: Four times a day (QID) | INTRAMUSCULAR | Status: DC

## 2017-08-06 MED ORDER — FUROSEMIDE 10 MG/ML IJ SOLN
40.0000 mg | Freq: Once | INTRAMUSCULAR | Status: AC
  Administered 2017-08-06: 40 mg via INTRAVENOUS
  Filled 2017-08-06: qty 4

## 2017-08-06 MED ORDER — FUROSEMIDE 10 MG/ML IJ SOLN
40.0000 mg | Freq: Two times a day (BID) | INTRAMUSCULAR | Status: DC
  Administered 2017-08-06: 40 mg via INTRAVENOUS
  Filled 2017-08-06: qty 4

## 2017-08-06 MED ORDER — POTASSIUM PHOSPHATE MONOBASIC 500 MG PO TABS
500.0000 mg | ORAL_TABLET | Freq: Three times a day (TID) | ORAL | Status: DC
  Administered 2017-08-06: 500 mg via ORAL
  Filled 2017-08-06 (×4): qty 1

## 2017-08-06 MED ORDER — HYDRALAZINE HCL 20 MG/ML IJ SOLN
10.0000 mg | Freq: Four times a day (QID) | INTRAMUSCULAR | Status: DC | PRN
  Administered 2017-08-07 – 2017-08-14 (×3): 10 mg via INTRAVENOUS
  Filled 2017-08-06 (×4): qty 1

## 2017-08-06 NOTE — Progress Notes (Signed)
[facility] Progress Note Patient Name: Kathleen Guerrero DOB: 10-26-1946 MRN: [id number]   Date of Service  08/06/2017  HPI/Events of Note  K, PHos low  eICU Interventions  KPHOS     Intervention Category Intermediate Interventions: Electrolyte abnormality - evaluation and management  Simonne Maffucci 08/06/2017, 6:05 AM

## 2017-08-06 NOTE — Progress Notes (Signed)
..   Name: Kathleen Guerrero MRN: [id number] DOB: 1945/12/01    ADMISSION DATE:  07/30/2017 CONSULTATION DATE:  08/01/17  REFERRING MD :  Roxan Hockey MD  CHIEF COMPLAINT:    BRIEF PATIENT DESCRIPTION: 71 yr old female with PMHx GERD, hiatal hernia, 50 pack yr smoker, HTN, HLD, CVA presented for RUL GGO on CT of the chest 13x9x72mm in size which showed increase in size to 13x20 mm. POD 2 s/p Right sided VATS with RUL lobectomy. Developed acute respiratory distress at 10:45 PM accompanied with new crepitus over right chest wall. S/p intubated and now on PRVC.   SIGNIFICANT EVENTS  9/26> Right sided VATS s/p RUL Lobectomy.  9/28 >Acute hypoxic respiratory failure with new subcutaneous emphysema>intubated /vent   STUDIES:  PFTs - Pulmonary function tests November 2017 FVC 3.19 (100%) FEV1 2.36 (97%) CT Chest 07/09/17> interval progression RUL nodule 37mm to 89mm   Cultures :  Sputum 9/28 >>normal flora  BC 9/28 >>   ABX:  Vanc 9/30>>>10/2 Ceftaz 9/30>>>  SUBJECTIVE: BP climbing. No sig change overnight.  Remains easily agitated.    VITAL SIGNS: Temp:  [96.2 F (35.7 C)-99.2 F (37.3 C)] 99.1 F (37.3 C) (10/03 1212) Pulse Rate:  [71-129] 72 (10/03 1349) Resp:  [12-30] 30 (10/03 1349) BP: (105-198)/(52-117) 141/69 (10/03 1349) SpO2:  [95 %-100 %] 100 % (10/03 1350) Arterial Line BP: (94-205)/(40-174) 148/73 (10/03 1300) FiO2 (%):  [40 %] 40 % (10/03 1350) Weight:  [90.9 kg (200 lb 6.4 oz)] 90.9 kg (200 lb 6.4 oz) (10/03 0530)  PHYSICAL EXAMINATION:  General: chronically ill appearing female, sedated on vent  Neuro: sedated, current RASS -2 HEENT: ETT, mm moist  Cardiovascular: s1s2 irreg, intermittent AFib  Lungs: resps even non labored on vent, coarse L>R, R CT x 2, +airleak , +crepitus but improved  Abdomen:  Soft ,+BS  Skin:  Dry /no rash, crepitus throughout neck, chest, arms  Recent Labs Lab 08/04/17 0417 08/05/17 0336 08/06/17 0329  NA 136 137 142  K 3.6 3.8  3.1*  CL 108 107 107  CO2 23 22 25   BUN 17 22* 30*  CREATININE 1.99* 1.65* 1.60*  GLUCOSE 173* 145* 157*    Recent Labs Lab 08/04/17 0417 08/05/17 0336 08/06/17 0329  HGB 7.2* 8.4* 7.9*  HCT 22.1* 25.4* 23.7*  WBC 15.9* 10.6* 13.5*  PLT 303 278 306   Dg Abd 1 View  Result Date: 08/05/2017 CLINICAL DATA:  Advancement of feeding tube EXAM: ABDOMEN - 1 VIEW COMPARISON:  Abdominal radiograph 08/05/2017 FINDINGS: A single fluoroscopic image is provided during enhancement of a nasoenteric feeding tube. Contrast material is seen at the tip of the tube, in the small bowel. 10 mL [id number]-[id number] was used. Fluoroscopy time was reported as 1 minutes 57 seconds. IMPRESSION: Intra procedural fluoroscopy. Electronically Signed   By: Ulyses Jarred M.D.   On: 08/05/2017 14:27   Ct Head Wo Contrast  Result Date: 08/05/2017 CLINICAL DATA:  Altered mental status EXAM: CT HEAD WITHOUT CONTRAST TECHNIQUE: Contiguous axial images were obtained from the base of the skull through the vertex without intravenous contrast. COMPARISON:  Brain MRI 01/16/2017 FINDINGS: Brain: No mass lesion, intraparenchymal hemorrhage or extra-axial collection. No evidence of acute cortical infarct. There is periventricular hypoattenuation compatible with chronic microvascular disease. Vascular: No hyperdense vessel or unexpected calcification. Skull: Normal visualized skull base, calvarium and extracranial soft tissues. There is multifocal soft tissue gas lateral to both maxillary sinuses and adjacent to the right lateral neck muscles. Unchanged  soft tissue lesion of the scalp vertex. Sinuses/Orbits: No sinus fluid levels or advanced mucosal thickening. No mastoid effusion. Normal orbits. IMPRESSION: 1. No acute intracranial abnormality. 2. Multifocal soft tissue gas in keeping with known extensive subcutaneous emphysema. Electronically Signed   By: Ulyses Jarred M.D.   On: 08/05/2017 20:04   Dg Chest Port 1 View  Result Date:  08/06/2017 CLINICAL DATA:  Endotracheal tube.  OG tube. EXAM: PORTABLE CHEST 1 VIEW COMPARISON:  08/05/2017 FINDINGS: Endotracheal tube terminates 2 cm above the carina. 2 enteric tubes are present, both of which course into the left upper abdomen with tips not imaged. Right jugular catheter and left PICC terminate over the lower SVC. Two right-sided chest tubes remain in place. Extensive subcutaneous emphysema remains throughout the chest and lower neck bilaterally. Postoperative changes are again seen in the right hilar region with unchanged soft tissue prominence and convex mediastinal contour at this level. No definite pneumothorax is identified, however sensitivity is reduced by the extensive subcutaneous emphysema. Interstitial lung opacities do not appear significantly changed, most notable in the left mid to lower lung. No large pleural effusion is identified. IMPRESSION: 1. Support devices as above. 2. No definite pneumothorax identified, with evaluation limited by persistent extensive subcutaneous emphysema. 3. Unchanged lung aeration. Electronically Signed   By: Logan Bores M.D.   On: 08/06/2017 07:22   Dg Chest Port 1 View  Result Date: 08/05/2017 CLINICAL DATA:  Intubated patient, respiratory failure, chest tube treatment of pneumothorax on the right. EXAM: PORTABLE CHEST 1 VIEW COMPARISON:  Portable chest x-ray of August 04, 2017 FINDINGS: A large amount of subcutaneous emphysema is present. The pleural line demonstrated on yesterday's study in the right apex is not clearly evident today. The 2 right-sided chest tubes are in stable position. The left lung is well-expanded. The interstitial markings are coarse in the left lung. There is stable right hilar soft tissue prominence. The heart is normal in size. There is calcification in the wall of the thoracic aorta. The esophagogastric tube and feeding tube tips project below the inferior margin of the image. The endotracheal tube tip lies  approximately 1.5 cm above the carina. The right internal jugular venous catheter tip projects over the midportion of the SVC. IMPRESSION: Persistent extensive subcutaneous emphysema which limits the sensitivity of the study. No definite residual right-sided pneumothorax. Coarse interstitial densities on the left are in part due to the overlying subcutaneous emphysema but there is likely mild interstitial edema or infiltrate in the left mid and lower lung. There is persistent left lower lobe atelectasis. Low positioning of the endotracheal tube. Withdrawal by 2 cm is recommended to avoid accidental mainstem bronchus intubation with patient movement. The other support tubes are in reasonable position. Thoracic aortic atherosclerosis. Electronically Signed   By: David  Martinique M.D.   On: 08/05/2017 07:50   Dg Abd Portable 1v  Result Date: 08/05/2017 CLINICAL DATA:  Obstructed feeding tube EXAM: PORTABLE ABDOMEN - 1 VIEW COMPARISON:  August 04, 2017 FINDINGS: Feeding tube is coiled in the stomach. There appears to be in acute bend in the tube in the distal stomach. Nasogastric tube tip and side port in stomach. No bowel dilatation or air-fluid level to suggest bowel obstruction. No free air. There is moderate stool in the colon. There are foci of calcification in both iliac arteries. IMPRESSION: Feeding tube coiled in stomach. There appears to be a bend in the tube in the distal stomach approximately 17 cm proximal to the tip. Nasogastric  tube tip and side port in stomach. No bowel obstruction or free air evident. There is iliac artery atherosclerosis. Electronically Signed   By: Lowella Grip III M.D.   On: 08/05/2017 09:14   Dg Abd Portable 1v  Result Date: 08/04/2017 CLINICAL DATA:  Check feeding catheter placement EXAM: PORTABLE ABDOMEN - 1 VIEW COMPARISON:  08/02/2017 FINDINGS: Feeding catheter is now noted within the stomach directed towards the pylorus. The tip may actually lie in the duodenal wall.  Nasogastric catheter is noted within the stomach. Considerable subcutaneous emphysema is noted bilaterally but stable. IMPRESSION: Feeding catheter and nasogastric catheter as described. Electronically Signed   By: Inez Catalina M.D.   On: 08/04/2017 16:40     ASSESSMENT / PLAN:   PULMONARY: Acute hypoxic respiratory failure - s/p R VATS and wedge resection of AdenoCa  Hx OSA  Difficult airway S/p Right sided VATS with wedge resection 9/26  well differentiated AdenoCA Stage Ia- pathology of 2 cm RUL mass  Sub-q emphysema  Plan  Vent support - 8cc/kg F/u ABG  Mental status barrier to weaning at this point  Daily SBT  VAP prevention  Follow CXR Chest tube management per CVTS  NEURO: Sedation needs on vent  Altered mental status post VATS Plan  rass goal -1  Continue fentanyl gtt / precedex gtt Risperidone added per CVTS  If no improvement overnight consider addition clonazepam/seroquel in am  PRN versed  Daily WUA   CARDIAC: Hypotension - Septic Shock - improving - Lactate /PCT nml . Resolved.  AFib with RVR  HTN  Plan  Cont stress dose steroids (started 9/29 )  Continue amiodarone gtt  Trend troponin  Follow CVP  ID: Sepsis Possible HCAP Plan  Cx pending  Cont Fortaz   Endocrine: Hyperglycemia  Plan  ICU Glycemic protocol    GI: Coffee ground material in OG 10/1 - improved.  Plan  NPO Continue TF  PPI Twice daily    Heme: Anemia - Per OR report 900cc Blood loss- received 2 units PRBCs in OR 9/26,  s/p 1 u PRBC 9/28  Plan  If Hgb <7 transfuse PRBCs or per CT Surgery's orders SCDs for VTE ppx  RENAL Acute renal Failure in setting of sepsis /hypotension  Hypokalemia  Hypophosphatemia  Plan  I/O  K-phos  bmet in am  Avoid nephrotoxins if able   Daughter updated at bedside 10/3.   Nickolas Madrid, NP 08/06/2017  2:53 PM Pager: [phone] or [phone]  Attending Note:  71 year old post op CVTS who now has severe agitation and  acute pulmonary edema.  On exam, diffuse crackles.  I reviewed CXR myself, ETT in good position.  Continue diureses as BP permits.  Clonazepam ordered for agitation as well as seroquel.  Will hopefully get agitation under control and be able to wean in AM.  Diureses as ordered.  PCCM will follow.  The patient is critically ill with multiple organ systems failure and requires high complexity decision making for assessment and support, frequent evaluation and titration of therapies, application of advanced monitoring technologies and extensive interpretation of multiple databases.   Critical Care Time devoted to patient care services described in this note is  35  Minutes. This time reflects time of care of this signee Dr Jennet Maduro. This critical care time does not reflect procedure time, or teaching time or supervisory time of PA/NP/Med student/Med Resident etc but could involve care discussion time.  Rush Farmer, M.D. [facility] Pulmonary/Critical Care  Medicine. Pager: [phone]. After hours pager: [phone].

## 2017-08-06 NOTE — Progress Notes (Signed)
7 Days Post-Op Procedure(s) (LRB): VIDEO ASSISTED THORACOSCOPY (VATS)/WEDGE RESECTION (Right) LOBECTOMY (Right) Subjective: Intubated, sedated  Objective: Vital signs in last 24 hours: Temp:  [96.2 F (35.7 C)-99.8 F (37.7 C)] 98.4 F (36.9 C) (10/03 0700) Pulse Rate:  [71-129] 78 (10/03 0600) Cardiac Rhythm: Normal sinus rhythm;Sinus tachycardia (10/02 2326) Resp:  [12-30] 30 (10/03 0600) BP: (97-182)/(51-117) 139/64 (10/03 0600) SpO2:  [96 %-100 %] 99 % (10/03 0600) Arterial Line BP: (87-205)/(40-174) 158/66 (10/03 0600) FiO2 (%):  [40 %] 40 % (10/03 0515) Weight:  [200 lb 6.4 oz (90.9 kg)] 200 lb 6.4 oz (90.9 kg) (10/03 0530)  Hemodynamic parameters for last 24 hours: CVP:  [5 mmHg-20 mmHg] 5 mmHg  Intake/Output from previous day: 10/02 0701 - 10/03 0700 In: 1371.5 [I.V.:529.5; NG/GT:492; IV Piggyback:350] Out: 5360 [Urine:4600; Emesis/NG output:400; Chest Tube:360] Intake/Output this shift: No intake/output data recorded.  General appearance: intubated Neurologic: sedated currently, agitated earlier Heart: regular rate and rhythm Lungs: CT sounds on right, left clear anteriorly Abdomen: normal findings: soft, non-tender + air leak  Lab Results:  Recent Labs  08/05/17 0336 08/06/17 0329  WBC 10.6* 13.5*  HGB 8.4* 7.9*  HCT 25.4* 23.7*  PLT 278 306   BMET:  Recent Labs  08/05/17 0336 08/06/17 0329  NA 137 142  K 3.8 3.1*  CL 107 107  CO2 22 25  GLUCOSE 145* 157*  BUN 22* 30*  CREATININE 1.65* 1.60*  CALCIUM 7.1* 7.1*    PT/INR: No results for input(s): LABPROT, INR in the last 72 hours. ABG    Component Value Date/Time   PHART 7.443 08/06/2017 0350   HCO3 26.1 08/06/2017 0350   TCO2 28 08/04/2017 0424   ACIDBASEDEF 1.4 08/05/2017 0406   O2SAT 97.8 08/06/2017 0350   CBG (last 3)   Recent Labs  08/05/17 2038 08/05/17 2342 08/06/17 0347  GLUCAP 124* 175* 153*    Assessment/Plan: S/P Procedure(s) (LRB): VIDEO ASSISTED THORACOSCOPY  (VATS)/WEDGE RESECTION (Right) LOBECTOMY (Right) -Remains critically ill  NEURO- agitation when sedation lightened  Will try low dose risperidone  Sedation only as necessary for safety  CT did not show any acute infarct  CV_-in SR, BP Ok  RESP- VDRF, s/p lobectomy, probable aspiration  Good gas exchange on 40% FiO2  Mental status has hampered wean  Remains on ceftaz  RENAL- creatinine continues to improve  Diuresed 4L yesterday  Change lasix from Q6 to BID  Keep Foley  Hypokalemia- supplement  ENDO- CBG moderately elevated on steroids  DVT prophylaxis- SCD + enoxaparin   LOS: 7 days    [name] 08/06/2017

## 2017-08-06 NOTE — Progress Notes (Signed)
Patient ID: Kathleen Guerrero, female   DOB: 28-May-1946, 71 y.o.   MRN: [id number] TCTS evening rounds:  Hemodynamically stable CVP 10  Good urine output  Remains on vent with sats 100% on 40% FIO2

## 2017-08-06 NOTE — Progress Notes (Signed)
[facility] ADULT ICU REPLACEMENT PROTOCOL FOR AM LAB REPLACEMENT ONLY  The patient does not apply for the [facility] Adult ICU Electrolyte Replacment Protocol based on the criteria listed below:    Is GFR >/= 40 ml/min? No.  Patient's GFR today is 32   Abnormal electrolyte(s): K=6.  If a panic level lab has been reported, has the CCM MD in charge been notified? Yes.  .   Physician:  Jerral Ralph, MD  Vear Clock 08/06/2017 6:01 AM

## 2017-08-07 ENCOUNTER — Inpatient Hospital Stay (HOSPITAL_COMMUNITY): Payer: Medicare HMO

## 2017-08-07 DIAGNOSIS — R451 Restlessness and agitation: Secondary | ICD-10-CM

## 2017-08-07 DIAGNOSIS — I10 Essential (primary) hypertension: Secondary | ICD-10-CM

## 2017-08-07 LAB — CBC
HCT: 24.4 % — ABNORMAL LOW (ref 36.0–46.0)
HEMOGLOBIN: 8.1 g/dL — AB (ref 12.0–15.0)
MCH: 29 pg (ref 26.0–34.0)
MCHC: 33.2 g/dL (ref 30.0–36.0)
MCV: 87.5 fL (ref 78.0–100.0)
PLATELETS: 310 10*3/uL (ref 150–400)
RBC: 2.79 MIL/uL — AB (ref 3.87–5.11)
RDW: 16.2 % — AB (ref 11.5–15.5)
WBC: 9.4 10*3/uL (ref 4.0–10.5)

## 2017-08-07 LAB — POCT I-STAT, CHEM 8
BUN: 40 mg/dL — AB (ref 6–20)
CALCIUM ION: 0.96 mmol/L — AB (ref 1.15–1.40)
Chloride: 105 mmol/L (ref 101–111)
Creatinine, Ser: 1.5 mg/dL — ABNORMAL HIGH (ref 0.44–1.00)
GLUCOSE: 129 mg/dL — AB (ref 65–99)
HCT: 22 % — ABNORMAL LOW (ref 36.0–46.0)
Hemoglobin: 7.5 g/dL — ABNORMAL LOW (ref 12.0–15.0)
POTASSIUM: 2.8 mmol/L — AB (ref 3.5–5.1)
Sodium: 148 mmol/L — ABNORMAL HIGH (ref 135–145)
TCO2: 27 mmol/L (ref 22–32)

## 2017-08-07 LAB — BASIC METABOLIC PANEL
Anion gap: 10 (ref 5–15)
BUN: 42 mg/dL — AB (ref 6–20)
CALCIUM: 7.2 mg/dL — AB (ref 8.9–10.3)
CHLORIDE: 105 mmol/L (ref 101–111)
CO2: 29 mmol/L (ref 22–32)
CREATININE: 1.61 mg/dL — AB (ref 0.44–1.00)
GFR, EST AFRICAN AMERICAN: 37 mL/min — AB (ref >60)
GFR, EST NON AFRICAN AMERICAN: 32 mL/min — AB (ref >60)
Glucose, Bld: 163 mg/dL — ABNORMAL HIGH (ref 65–99)
Potassium: 3.3 mmol/L — ABNORMAL LOW (ref 3.5–5.1)
SODIUM: 144 mmol/L (ref 135–145)

## 2017-08-07 LAB — BLOOD GAS, ARTERIAL
Acid-Base Excess: 7.1 mmol/L — ABNORMAL HIGH (ref 0.0–2.0)
Bicarbonate: 30 mmol/L — ABNORMAL HIGH (ref 20.0–28.0)
DRAWN BY: 252031
FIO2: 40
O2 Saturation: 99 %
PEEP: 5 cmH2O
PO2 ART: 122 mmHg — AB (ref 83.0–108.0)
Patient temperature: 98.6
Pressure control: 20 cmH2O
RATE: 30 resp/min
pCO2 arterial: 33.7 mmHg (ref 32.0–48.0)
pH, Arterial: 7.557 — ABNORMAL HIGH (ref 7.350–7.450)

## 2017-08-07 LAB — GLUCOSE, CAPILLARY
GLUCOSE-CAPILLARY: 112 mg/dL — AB (ref 65–99)
GLUCOSE-CAPILLARY: 128 mg/dL — AB (ref 65–99)
GLUCOSE-CAPILLARY: 158 mg/dL — AB (ref 65–99)
Glucose-Capillary: 110 mg/dL — ABNORMAL HIGH (ref 65–99)
Glucose-Capillary: 133 mg/dL — ABNORMAL HIGH (ref 65–99)
Glucose-Capillary: 137 mg/dL — ABNORMAL HIGH (ref 65–99)

## 2017-08-07 LAB — MAGNESIUM: MAGNESIUM: 1.8 mg/dL (ref 1.7–2.4)

## 2017-08-07 LAB — PHOSPHORUS: PHOSPHORUS: 2.5 mg/dL (ref 2.5–4.6)

## 2017-08-07 MED ORDER — POTASSIUM CHLORIDE 10 MEQ/50ML IV SOLN
10.0000 meq | INTRAVENOUS | Status: AC
  Administered 2017-08-07 (×5): 10 meq via INTRAVENOUS
  Filled 2017-08-07 (×3): qty 50

## 2017-08-07 MED ORDER — POTASSIUM CHLORIDE 10 MEQ/50ML IV SOLN
10.0000 meq | INTRAVENOUS | Status: AC
  Administered 2017-08-07 (×3): 10 meq via INTRAVENOUS

## 2017-08-07 MED ORDER — HYDROCORTISONE NA SUCCINATE PF 100 MG IJ SOLR
50.0000 mg | Freq: Two times a day (BID) | INTRAMUSCULAR | Status: DC
  Administered 2017-08-07 – 2017-08-08 (×2): 50 mg via INTRAVENOUS
  Filled 2017-08-07 (×2): qty 2

## 2017-08-07 MED ORDER — RISPERIDONE 1 MG/ML PO SOLN
1.0000 mg | Freq: Two times a day (BID) | ORAL | Status: DC
  Administered 2017-08-07 – 2017-08-12 (×11): 1 mg
  Filled 2017-08-07 (×12): qty 1

## 2017-08-07 MED ORDER — CLONIDINE HCL 0.1 MG PO TABS
0.1000 mg | ORAL_TABLET | Freq: Two times a day (BID) | ORAL | Status: DC
  Administered 2017-08-07 – 2017-08-20 (×27): 0.1 mg via ORAL
  Filled 2017-08-07 (×27): qty 1

## 2017-08-07 MED ORDER — POTASSIUM CHLORIDE 20 MEQ/15ML (10%) PO SOLN
40.0000 meq | Freq: Once | ORAL | Status: AC
  Administered 2017-08-07: 40 meq via ORAL

## 2017-08-07 MED ORDER — POTASSIUM CHLORIDE 20 MEQ/15ML (10%) PO SOLN
ORAL | Status: AC
  Filled 2017-08-07: qty 30

## 2017-08-07 NOTE — Progress Notes (Signed)
Pharmacy Antibiotic Note  Kathleen Guerrero is a 71 y.o. female s/p VATS 9/26 continues on day # 7 ceftazidime for sepsis/possible HCAP. Renal function stable with sCr 1.61 and CrCl ~ 66ml/min.   Plan: 1) Continue ceftazidime 1g IV q12 2) Follow up LOT  Height: 5' 2.75" (159.4 cm) Weight: 193 lb 5.5 oz (87.7 kg) IBW/kg (Calculated) : 51.83  Temp (24hrs), Avg:98.8 F (37.1 C), Min:97.7 F (36.5 C), Max:99.4 F (37.4 C)   Recent Labs Lab 08/02/17 0038 08/02/17 0418 08/03/17 0353 08/03/17 1200  08/03/17 2047  08/04/17 0417 08/04/17 1255 08/05/17 0336 08/06/17 0329 08/06/17 1644 08/06/17 2105 08/07/17 0356  WBC  --  20.1* 22.9*  --   < > 17.7*  --  15.9*  --  10.6* 13.5*  --   --  9.4  CREATININE  --  0.77 1.69*  --   < > 2.01*  < > 1.99*  --  1.65* 1.60* 1.50* 1.63* 1.61*  LATICACIDVEN 0.7 0.9 0.8  --   --   --   --   --  1.3  --   --   --   --   --   VANCOTROUGH  --   --   --  29*  --   --   --   --   --   --   --   --   --   --   < > = values in this interval not displayed.  Estimated Creatinine Clearance: 34.5 mL/min (A) (by C-G formula based on SCr of 1.61 mg/dL (H)).    No Known Allergies  Antimicrobials this admission: Vanc 9/28>>10/2 Tressie Ellis 9/28>> Cefuroxime 9/26>>27  Dose adjustments this admission: 9/30 VT = 29 on 1g q12 > decrease to 1250mg  q24  Microbiology results: 9/28 bldx2:ngtd 9/28 TA: normal flora  Thank you for allowing pharmacy to be a part of this patient's care.  Deboraha Sprang 08/07/2017 9:50 AM

## 2017-08-07 NOTE — Progress Notes (Signed)
..   Name: Kathleen Guerrero MRN: [id number] DOB: 01/14/1946    ADMISSION DATE:  07/30/2017 CONSULTATION DATE:  08/01/17  REFERRING MD :  Roxan Hockey MD  CHIEF COMPLAINT:    BRIEF PATIENT DESCRIPTION: 71 yr old female with PMHx GERD, hiatal hernia, 50 pack yr smoker, HTN, HLD, CVA presented for RUL GGO on CT of the chest 13x9x63mm in size which showed increase in size to 13x20 mm. POD 2 s/p Right sided VATS with RUL lobectomy. Developed acute respiratory distress at 10:45 PM accompanied with new crepitus over right chest wall. S/p intubated and now on PRVC.   SIGNIFICANT EVENTS  9/26> Right sided VATS s/p RUL Lobectomy.  9/28 >Acute hypoxic respiratory failure with new subcutaneous emphysema>intubated /vent   STUDIES:  PFTs - Pulmonary function tests November 2017 FVC 3.19 (100%) FEV1 2.36 (97%) CT Chest 07/09/17> interval progression RUL nodule 92mm to 58mm   Cultures :  Sputum 9/28 >>normal flora  BC 9/28 >> NEG   ABX:  Vanc 9/30>>>10/2 Ceftaz 9/30>>>  SUBJECTIVE: Remains very agitated but slightly improved, recovers more easily.  HTN, tachy this am after turning.  Required PRN hydralazine for HTN.    VITAL SIGNS: Temp:  [97.7 F (36.5 C)-99.4 F (37.4 C)] 97.7 F (36.5 C) (10/04 0803) Pulse Rate:  [63-120] 66 (10/04 0743) Resp:  [19-30] 30 (10/04 0743) BP: (115-198)/(60-90) 151/90 (10/04 0743) SpO2:  [95 %-100 %] 100 % (10/04 0749) Arterial Line BP: (118-192)/(54-112) 168/74 (10/04 0600) FiO2 (%):  [40 %] 40 % (10/04 0749) Weight:  [87.7 kg (193 lb 5.5 oz)] 87.7 kg (193 lb 5.5 oz) (10/04 0500)  PHYSICAL EXAMINATION:  General: chronically ill appearing female, agitated on vent  Neuro: sedated, current RASS +2 HEENT: ETT, mm moist  Cardiovascular: s1s2 irreg, intermittent AFib  Lungs: resps even non labored on vent, coarse L>R, R CT x 2, +airleak , minimal crepitus R arm, R chest  Abdomen:  Soft ,+BS  Skin:  Dry /no rash, crepitus throughout neck, chest,  arms  Recent Labs Lab 08/06/17 0329 08/06/17 1644 08/06/17 2105 08/07/17 0356  NA 142 146* 142 144  K 3.1* 3.4* 3.3* 3.3*  CL 107 105 105 105  CO2 25  --  27 29  BUN 30* 33* 39* 42*  CREATININE 1.60* 1.50* 1.63* 1.61*  GLUCOSE 157* 158* 162* 163*    Recent Labs Lab 08/05/17 0336 08/06/17 0329 08/06/17 1644 08/07/17 0356  HGB 8.4* 7.9* 7.5* 8.1*  HCT 25.4* 23.7* 22.0* 24.4*  WBC 10.6* 13.5*  --  9.4  PLT 278 306  --  310   Dg Abd 1 View  Result Date: 08/05/2017 CLINICAL DATA:  Advancement of feeding tube EXAM: ABDOMEN - 1 VIEW COMPARISON:  Abdominal radiograph 08/05/2017 FINDINGS: A single fluoroscopic image is provided during enhancement of a nasoenteric feeding tube. Contrast material is seen at the tip of the tube, in the small bowel. 10 mL [id number]-300 was used. Fluoroscopy time was reported as 1 minutes 57 seconds. IMPRESSION: Intra procedural fluoroscopy. Electronically Signed   By: Ulyses Jarred M.D.   On: 08/05/2017 14:27   Ct Head Wo Contrast  Result Date: 08/05/2017 CLINICAL DATA:  Altered mental status EXAM: CT HEAD WITHOUT CONTRAST TECHNIQUE: Contiguous axial images were obtained from the base of the skull through the vertex without intravenous contrast. COMPARISON:  Brain MRI 01/16/2017 FINDINGS: Brain: No mass lesion, intraparenchymal hemorrhage or extra-axial collection. No evidence of acute cortical infarct. There is periventricular hypoattenuation compatible with chronic microvascular disease.  Vascular: No hyperdense vessel or unexpected calcification. Skull: Normal visualized skull base, calvarium and extracranial soft tissues. There is multifocal soft tissue gas lateral to both maxillary sinuses and adjacent to the right lateral neck muscles. Unchanged soft tissue lesion of the scalp vertex. Sinuses/Orbits: No sinus fluid levels or advanced mucosal thickening. No mastoid effusion. Normal orbits. IMPRESSION: 1. No acute intracranial abnormality. 2. Multifocal soft  tissue gas in keeping with known extensive subcutaneous emphysema. Electronically Signed   By: Ulyses Jarred M.D.   On: 08/05/2017 20:04   Dg Chest Port 1 View  Result Date: 08/07/2017 CLINICAL DATA:  Respiratory failure, intubated patient. EXAM: PORTABLE CHEST 1 VIEW COMPARISON:  Portable chest x-ray of August 06, 2017 FINDINGS: The lungs are reasonably well inflated. The degree of subcutaneous emphysema has decreased bilaterally. No definite new pneumothorax is observed today. The 2 right chest tubes remain with the lateral chest tube having been retracted inferiorly. The upper chest tube now projects over the posterior aspect of the right fifth rib and the lower chest tube over the posterior aspect of the right seventh rib. There is no significant mediastinal shift. The left lung is clear. The heart and pulmonary vascularity are normal. There is stable soft tissue fullness in the right hilar region. The endotracheal tube tip projects approximately 2.5 cm above the carina. The feeding tube tip and esophagogastric tube tip project below the inferior margin of the image. The proximal port of the esophagogastric tube lies in the gastric cardia. IMPRESSION: Improved appearance of the chest chiefly due to decreased subcutaneous emphysema. No definite pneumothorax. Repositioning of 1 of the right chest tubes. Electronically Signed   By: David  Martinique M.D.   On: 08/07/2017 07:45   Dg Chest Port 1 View  Result Date: 08/06/2017 CLINICAL DATA:  Endotracheal tube.  OG tube. EXAM: PORTABLE CHEST 1 VIEW COMPARISON:  08/05/2017 FINDINGS: Endotracheal tube terminates 2 cm above the carina. 2 enteric tubes are present, both of which course into the left upper abdomen with tips not imaged. Right jugular catheter and left PICC terminate over the lower SVC. Two right-sided chest tubes remain in place. Extensive subcutaneous emphysema remains throughout the chest and lower neck bilaterally. Postoperative changes are again  seen in the right hilar region with unchanged soft tissue prominence and convex mediastinal contour at this level. No definite pneumothorax is identified, however sensitivity is reduced by the extensive subcutaneous emphysema. Interstitial lung opacities do not appear significantly changed, most notable in the left mid to lower lung. No large pleural effusion is identified. IMPRESSION: 1. Support devices as above. 2. No definite pneumothorax identified, with evaluation limited by persistent extensive subcutaneous emphysema. 3. Unchanged lung aeration. Electronically Signed   By: Logan Bores M.D.   On: 08/06/2017 07:22   Dg Abd Portable 1v  Result Date: 08/05/2017 CLINICAL DATA:  Obstructed feeding tube EXAM: PORTABLE ABDOMEN - 1 VIEW COMPARISON:  August 04, 2017 FINDINGS: Feeding tube is coiled in the stomach. There appears to be in acute bend in the tube in the distal stomach. Nasogastric tube tip and side port in stomach. No bowel dilatation or air-fluid level to suggest bowel obstruction. No free air. There is moderate stool in the colon. There are foci of calcification in both iliac arteries. IMPRESSION: Feeding tube coiled in stomach. There appears to be a bend in the tube in the distal stomach approximately 17 cm proximal to the tip. Nasogastric tube tip and side port in stomach. No bowel obstruction or free air  evident. There is iliac artery atherosclerosis. Electronically Signed   By: Lowella Grip III M.D.   On: 08/05/2017 09:14     ASSESSMENT / PLAN:   PULMONARY: Acute hypoxic respiratory failure - s/p R VATS and wedge resection of AdenoCa  Hx OSA  Difficult airway S/p Right sided VATS with wedge resection 9/26  well differentiated AdenoCA Stage Ia- pathology of 2 cm RUL mass  Sub-q emphysema  Plan  Vent support - 8cc/kg F/u ABG  Mental status remains barrier to weaning at this point but improving slowly - see neuro  Daily SBT  VAP prevention  Follow CXR Chest tube management  per CVTS  NEURO: Sedation needs on vent  Altered mental status post VATS Plan  rass goal 0/-1  Continue fentanyl gtt / precedex gtt and wean as able  Continue risperidone - increase to 1mg  BID continue clonazepam   PRN versed  Daily WUA   CARDIAC: Hypotension - Septic Shock - improving - Lactate /PCT nml . Resolved.  AFib with RVR  HTN  Plan  Wean stress dose steroids to 50mg  IV q12  Continue amiodarone gtt  Home norvasc added 10/3 Add clonidine 0.1mg  BID which should also help with agitation a bit  Follow CVP  ID: Sepsis Possible HCAP Plan  Cx neg  Cont Fortaz 9/30>>>   Endocrine: Hyperglycemia  Plan  ICU Glycemic protocol    GI: Coffee ground material in OG 10/1 - resolved.  Plan  NPO Continue TF  PPI Twice daily    Heme: Anemia - Per OR report 900cc Blood loss- received 2 units PRBCs in OR 9/26,  s/p 1 u PRBC 9/28  Plan  If Hgb <7 transfuse PRBCs or per CT Surgery's orders SCDs for VTE ppx  RENAL Acute renal Failure in setting of sepsis /hypotension  Hypokalemia  Hypophosphatemia  Plan  I/O  Replete K as needed  bmet in am  Avoid nephrotoxins if able   Daughter updated at bedside 10/4.   Nickolas Madrid, NP 08/07/2017  9:07 AM Pager: [phone] or [phone]  Attending Note:  71 year old female in respiratory failure with agitation and pulmonary edema who presents to PCCM for persistent respiratory failure.  Agitation is a major obstacle here.  On exam, she is sedate with coarse BS.  Reportedly, the patient had severely injured airway during intubation so maybe reasonable to wait on extubation.  In the meantime, continue weaning, diureses, add clonidine for BP and agiation as a conjunction with precedex, continue clonazepam and seroquel.  Hold off weaning today.  Will start again in AM.  Anticipate no extubation til early next week given airway injury.  The patient is critically ill with multiple organ systems failure and requires high  complexity decision making for assessment and support, frequent evaluation and titration of therapies, application of advanced monitoring technologies and extensive interpretation of multiple databases.   Critical Care Time devoted to patient care services described in this note is  35  Minutes. This time reflects time of care of this signee Dr Jennet Maduro. This critical care time does not reflect procedure time, or teaching time or supervisory time of PA/NP/Med student/Med Resident etc but could involve care discussion time.  Rush Farmer, M.D. [facility] Pulmonary/Critical Care Medicine. Pager: [phone]. After hours pager: [phone].

## 2017-08-07 NOTE — Progress Notes (Signed)
Nutrition Follow-up  DOCUMENTATION CODES:   Obesity unspecified  INTERVENTION:   Continue:  Vital High Protein @ 50 mL/hr. (1200 ml/day) Provides 1200 kcal, 105 grams of protein, and 1003 mL free water.  Recommend bowel regimen   NUTRITION DIAGNOSIS:   Inadequate oral intake related to inability to eat as evidenced by NPO status. Ongoing.   GOAL:   Provide needs based on ASPEN/SCCM guidelines Ongoing.   MONITOR:   Vent status, Weight trends, Labs, Skin  ASSESSMENT:   70 yr old female with PMHx GERD, hiatal hernia, 50 pack yr smoker, HTN, HLD, CVA presented for RUL GGO on CT of the chest 13x9x33mm in size which showed increase in size to 13x20 mm. POD 2 s/p Right sided VATS with RUL lobectomy. Developed acute respiratory distress at 10:45 PM on 9/28 accompanied with new crepitus over right chest wall. S/p intubated and now on PRVC.   Patient is currently intubated on ventilator support 10/1 Cortrak tube placed 10/2 Cortrak tube kinked and adjusted per radiology  Chest tube x 2 with 140 ml output x 24 hrs  Per MD notes mental status and possible airway injury during intubation delaying extubation.  Medications reviewed and include: dulcolax, solu-cortef, senokot-s Labs reviewed: K+ 3.3 (L) CBG's: [phone]   Diet Order:  Diet NPO time specified  Skin:  Wound (see comment) (R chest incision from 9/26)  Last BM:  PTA  Height:   Ht Readings from Last 1 Encounters:  07/31/17 5' 2.75" (1.594 m)    Weight:   Wt Readings from Last 1 Encounters:  08/07/17 193 lb 5.5 oz (87.7 kg)    Ideal Body Weight:  51.7 kg  BMI:  Body mass index is 34.52 kg/m.  Estimated Nutritional Needs:   Kcal:  6160-7371 (12-15 kcal/kg)  Protein:  103 grams (2 grams/kg IBW)  Fluid:  >/= 1.5 L/day  EDUCATION NEEDS:   No education needs identified at this time  [place], [place], [place] Pager [phone] After Hours Pager

## 2017-08-07 NOTE — Progress Notes (Signed)
Patient ID: Kathleen Guerrero, female   DOB: 1946-02-12, 71 y.o.   MRN: [id number] EVENING ROUNDS NOTE :     [address]       [place] [address]             [phone]                 8 Days Post-Op Procedure(s) (LRB): VIDEO ASSISTED THORACOSCOPY (VATS)/WEDGE RESECTION (Right) LOBECTOMY (Right)  Total Length of Stay:  LOS: 8 days  BP (!) 104/53   Pulse 74   Temp 98.6 F (37 C) (Oral)   Resp (!) 30   Ht 5' 2.75" (1.594 m)   Wt 193 lb 5.5 oz (87.7 kg)   SpO2 100%   BMI 34.52 kg/m   .Intake/Output      10/04 0701 - 10/05 0700   I.V. (mL/kg) 358.4 (4.1)   NG/GT 590   IV Piggyback 50   Total Intake(mL/kg) 998.4 (11.4)   Urine (mL/kg/hr) 500 (0.5)   Emesis/NG output 250   Chest Tube 40   Total Output 790   Net +208.4         . sodium chloride    . amiodarone 30 mg/hr (08/07/17 1700)  . cefTAZidime (FORTAZ)  IV Stopped (08/07/17 1630)  . dexmedetomidine (PRECEDEX) IV infusion 1 mcg/kg/hr (08/07/17 1700)  . feeding supplement (VITAL HIGH PROTEIN) 1,000 mL (08/07/17 0022)  . fentaNYL infusion INTRAVENOUS 50 mcg/hr (08/07/17 1300)  . norepinephrine (LEVOPHED) Adult infusion Stopped (08/04/17 1700)  . potassium chloride 10 mEq (08/07/17 1700)  . potassium chloride Stopped (08/07/17 1859)     Lab Results  Component Value Date   WBC 9.4 08/07/2017   HGB 7.5 (L) 08/07/2017   HCT 22.0 (L) 08/07/2017   PLT 310 08/07/2017   GLUCOSE 129 (H) 08/07/2017   CHOL 93 01/17/2017   TRIG 113 01/17/2017   HDL 40 (L) 01/17/2017   LDLCALC 30 01/17/2017   ALT 16 08/06/2017   AST 17 08/06/2017   NA 148 (H) 08/07/2017   K 2.8 (L) 08/07/2017   CL 105 08/07/2017   CREATININE 1.50 (H) 08/07/2017   BUN 40 (H) 08/07/2017   CO2 29 08/07/2017   INR 0.99 07/28/2017   HGBA1C 5.4 01/17/2017   hgb 7.5  Remains on vent   Grace Isaac MD  Beeper [phone] Office [phone] 08/07/2017 7:33 PM
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
## 2017-08-08 ENCOUNTER — Inpatient Hospital Stay (HOSPITAL_COMMUNITY): Payer: Medicare HMO

## 2017-08-08 LAB — GLUCOSE, CAPILLARY
GLUCOSE-CAPILLARY: 111 mg/dL — AB (ref 65–99)
Glucose-Capillary: 141 mg/dL — ABNORMAL HIGH (ref 65–99)
Glucose-Capillary: 148 mg/dL — ABNORMAL HIGH (ref 65–99)
Glucose-Capillary: 149 mg/dL — ABNORMAL HIGH (ref 65–99)
Glucose-Capillary: 128 mg/dL — ABNORMAL HIGH (ref 65–99)

## 2017-08-08 LAB — COMPREHENSIVE METABOLIC PANEL
ALT: 33 U/L (ref 14–54)
AST: 35 U/L (ref 15–41)
Albumin: 1.9 g/dL — ABNORMAL LOW (ref 3.5–5.0)
Alkaline Phosphatase: 54 U/L (ref 38–126)
Anion gap: 9 (ref 5–15)
BUN: 46 mg/dL — ABNORMAL HIGH (ref 6–20)
CO2: 27 mmol/L (ref 22–32)
Calcium: 7.3 mg/dL — ABNORMAL LOW (ref 8.9–10.3)
Chloride: 110 mmol/L (ref 101–111)
Creatinine, Ser: 1.6 mg/dL — ABNORMAL HIGH (ref 0.44–1.00)
GFR calc Af Amer: 37 mL/min — ABNORMAL LOW (ref >60)
GFR calc non Af Amer: 32 mL/min — ABNORMAL LOW (ref >60)
Glucose, Bld: 148 mg/dL — ABNORMAL HIGH (ref 65–99)
Potassium: 3.5 mmol/L (ref 3.5–5.1)
Sodium: 146 mmol/L — ABNORMAL HIGH (ref 135–145)
Total Bilirubin: 0.5 mg/dL (ref 0.3–1.2)
Total Protein: 5.3 g/dL — ABNORMAL LOW (ref 6.5–8.1)

## 2017-08-08 LAB — CBC
HCT: 23.9 % — ABNORMAL LOW (ref 36.0–46.0)
Hemoglobin: 7.6 g/dL — ABNORMAL LOW (ref 12.0–15.0)
MCH: 28.6 pg (ref 26.0–34.0)
MCHC: 31.8 g/dL (ref 30.0–36.0)
MCV: 89.8 fL (ref 78.0–100.0)
PLATELETS: 290 10*3/uL (ref 150–400)
RBC: 2.66 MIL/uL — ABNORMAL LOW (ref 3.87–5.11)
RDW: 16.6 % — AB (ref 11.5–15.5)
WBC: 8.6 10*3/uL (ref 4.0–10.5)

## 2017-08-08 LAB — MAGNESIUM: MAGNESIUM: 1.7 mg/dL (ref 1.7–2.4)

## 2017-08-08 MED ORDER — AMIODARONE HCL 200 MG PO TABS
200.0000 mg | ORAL_TABLET | Freq: Two times a day (BID) | ORAL | Status: DC
  Administered 2017-08-08 – 2017-08-15 (×16): 200 mg via NASOGASTRIC
  Filled 2017-08-08 (×16): qty 1
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
## 2017-08-08 NOTE — Progress Notes (Signed)
PC changed to 16 and RR changed to 16 per MD order.

## 2017-08-08 NOTE — Progress Notes (Signed)
9 Days Post-Op Procedure(s) (LRB): VIDEO ASSISTED THORACOSCOPY (VATS)/WEDGE RESECTION (Right) LOBECTOMY (Right) Subjective: Intubated and sedated Shakes head yes and no to questions  Objective: Vital signs in last 24 hours: Temp:  [98.6 F (37 C)-99.6 F (37.6 C)] 98.8 F (37.1 C) (10/05 0400) Pulse Rate:  [48-92] 89 (10/05 0842) Cardiac Rhythm: Normal sinus rhythm (10/05 0800) Resp:  [24-30] 30 (10/05 0842) BP: (96-158)/(42-115) 120/75 (10/05 0842) SpO2:  [98 %-100 %] 100 % (10/05 0847) Arterial Line BP: (92-178)/(50-77) 164/77 (10/05 0800) FiO2 (%):  [40 %] 40 % (10/05 0847) Weight:  [193 lb 5.5 oz (87.7 kg)] 193 lb 5.5 oz (87.7 kg) (10/05 0412)  Hemodynamic parameters for last 24 hours: CVP:  [7 mmHg-16 mmHg] 9 mmHg  Intake/Output from previous day: 10/04 0701 - 10/05 0700 In: 2325.3 [I.V.:885.3; NG/GT:1390; IV Piggyback:50] Out: 2075 [Urine:1625; Emesis/NG output:250; Chest Tube:200] Intake/Output this shift: Total I/O In: 141.7 [I.V.:41.7; NG/GT:100] Out: 65 [Urine:65]  General appearance: intubated Neurologic: nonfocal, still sedated Heart: regular rate and rhythm Lungs: faint rhonchi on right Abdomen: normal findings: soft, non-tender + air leak  Lab Results:  Recent Labs  08/07/17 0356 08/07/17 1700 08/08/17 0340  WBC 9.4  --  8.6  HGB 8.1* 7.5* 7.6*  HCT 24.4* 22.0* 23.9*  PLT 310  --  290   BMET:  Recent Labs  08/07/17 0356 08/07/17 1700 08/08/17 0340  NA 144 148* 146*  K 3.3* 2.8* 3.5  CL 105 105 110  CO2 29  --  27  GLUCOSE 163* 129* 148*  BUN 42* 40* 46*  CREATININE 1.61* 1.50* 1.60*  CALCIUM 7.2*  --  7.3*    PT/INR: No results for input(s): LABPROT, INR in the last 72 hours. ABG    Component Value Date/Time   PHART 7.557 (H) 08/07/2017 0350   HCO3 30.0 (H) 08/07/2017 0350   TCO2 27 08/07/2017 1700   ACIDBASEDEF 1.4 08/05/2017 0406   O2SAT 99.0 08/07/2017 0350   CBG (last 3)   Recent Labs  08/07/17 2353 08/08/17 0342  08/08/17 0823  GLUCAP 137* 141* 148*    Assessment/Plan: S/P Procedure(s) (LRB): VIDEO ASSISTED THORACOSCOPY (VATS)/WEDGE RESECTION (Right) LOBECTOMY (Right) -remains critically ill, but relatively stable  CV- maintaining SR- will change amiodarone to VT  RESP- VDRF- has been unable to wean due to mental status  Hopefully can start to wean soon  Day 7 ceftaz for presumed pneumonia  RENAL- creatinine stable  Hypokalemia improved  ENDO- CBG modestly elevated  GI/ NUTRITION- on goal TF via Cor track  NEURO/ PSYCH- postop delirium. She is still sedated but less agitated, more cooperative today  SCD + enoxaparin for DVT prophylaxis   LOS: 9 days    [name] 08/08/2017

## 2017-08-08 NOTE — Progress Notes (Signed)
PULMONARY / CRITICAL CARE MEDICINE   Name: Kathleen Guerrero MRN: [id number] DOB: 1946-01-08    ADMISSION DATE:  07/30/2017 CONSULTATION DATE:  08/01/17  REFERRING MD:  Roxan Hockey  CHIEF COMPLAINT:  Post operative vent management  HISTORY OF PRESENT ILLNESS:   71 y/o female underwent resection of her RUL on 9/26, developed worsening respiratory failure on 9/28 requiring intubation.    PAST MEDICAL HISTORY :  She  has a past medical history of Arthritis; GERD (gastroesophageal reflux disease); Headache; Hepatitis; History of hiatal hernia; History of loop recorder; Hypertension; Stroke [facility]) (07/2016); and Tobacco use.   SUBJECTIVE:  Failed weaning this morning Calmer now on risperdal  VITAL SIGNS: BP 133/64   Pulse 94   Temp 98.8 F (37.1 C) (Oral)   Resp (!) 30   Ht 5' 2.75" (1.594 m)   Wt 193 lb 5.5 oz (87.7 kg)   SpO2 100%   BMI 34.52 kg/m   HEMODYNAMICS: CVP:  [7 mmHg-16 mmHg] 7 mmHg  VENTILATOR SETTINGS: Vent Mode: PCV FiO2 (%):  [40 %] 40 % Set Rate:  [30 bmp] 30 bmp PEEP:  [5 cmH20] 5 cmH20 Plateau Pressure:  [17 cmH20-22 cmH20] 18 cmH20  INTAKE / OUTPUT: I/O last 3 completed shifts: In: 4077.7 [I.V.:1557.7; NG/GT:2170; IV Piggyback:350] Out: 0938 [Urine:3150; Emesis/NG output:250; Chest Tube:280]  PHYSICAL EXAMINATION:  General:  In bed on vent HENT: NCAT ETT in place PULM: CTA B, vent supported breathing, R chest tubes in place CV: RRR, no mgr GI: BS+, soft, nontender MSK: normal bulk and tone Neuro: sedated on vent, follow commands    LABS:  BMET  Recent Labs Lab 08/06/17 2105 08/07/17 0356 08/07/17 1700 08/08/17 0340  NA 142 144 148* 146*  K 3.3* 3.3* 2.8* 3.5  CL 105 105 105 110  CO2 27 29  --  27  BUN 39* 42* 40* 46*  CREATININE 1.63* 1.61* 1.50* 1.60*  GLUCOSE 162* 163* 129* 148*    Electrolytes  Recent Labs Lab 08/05/17 0336 08/06/17 0329 08/06/17 2105 08/07/17 0356 08/08/17 0340  CALCIUM 7.1* 7.1* 7.3* 7.2* 7.3*   MG 2.2 2.0  --  1.8 1.7  PHOS 2.5 1.7*  --  2.5  --     CBC  Recent Labs Lab 08/06/17 0329  08/07/17 0356 08/07/17 1700 08/08/17 0340  WBC 13.5*  --  9.4  --  8.6  HGB 7.9*  < > 8.1* 7.5* 7.6*  HCT 23.7*  < > 24.4* 22.0* 23.9*  PLT 306  --  310  --  290  < > = values in this interval not displayed.  Coag's No results for input(s): APTT, INR in the last 168 hours.  Sepsis Markers  Recent Labs Lab 08/02/17 0418 08/03/17 0353 08/04/17 1255 08/05/17 0336 08/06/17 0329  LATICACIDVEN 0.9 0.8 1.3  --   --   PROCALCITON  --   --  0.29 0.16 0.12    ABG  Recent Labs Lab 08/05/17 0406 08/06/17 0350 08/07/17 0350  PHART 7.468* 7.443 7.557*  PCO2ART 30.5* 38.8 33.7  PO2ART 73.0* 106 122*    Liver Enzymes  Recent Labs Lab 08/05/17 0336 08/06/17 0329 08/08/17 0340  AST 18 17 35  ALT 15 16 33  ALKPHOS 61 59 54  BILITOT 0.6 0.5 0.5  ALBUMIN 1.9* 2.2* 1.9*    Cardiac Enzymes  Recent Labs Lab 08/03/17 2047 08/04/17 0417 08/04/17 1255  TROPONINI 0.19* 0.17* 0.12*    Glucose  Recent Labs Lab 08/07/17 0800 08/07/17 1203  08/07/17 2014 08/07/17 2353 08/08/17 0342 08/08/17 0823  GLUCAP 158* 110* 112* 137* 141* 148*    Imaging Dg Chest Portable 1 View  Result Date: 08/08/2017 CLINICAL DATA:  Respiratory failure EXAM: PORTABLE CHEST 1 VIEW COMPARISON:  08/07/2017 FINDINGS: Cardiac shadow is stable. Nasogastric catheter, feeding catheter and endotracheal tube are noted in satisfactory position. Postsurgical changes are again noted on the right with 2 thoracostomy catheters in place. Small recurrent pneumothorax is noted in the right apex new from the prior exam. No focal infiltrate is seen. No acute bony abnormality is seen. IMPRESSION: Tubes and lines as described. Small recurrent right pneumothorax. These results will be called to the ordering clinician or representative by the Radiologist Assistant, and communication documented in the PACS or zVision  Dashboard. Electronically Signed   By: Inez Catalina M.D.   On: 08/08/2017 07:34     SIGNIFICANT EVENTS  9/26> Right sided VATS s/p RUL Lobectomy.  9/28 >Acute hypoxic respiratory failure with new subcutaneous emphysema>intubated /vent   STUDIES:  PFTs - Pulmonary function tests November 2017 > ratio 74%, FVC 3.19 (100%) FEV1 2.36 (97%) CT Chest 07/09/17> interval progression RUL nodule 71mm to 58mm  CT head 10/2 > NAICP, soft tissue gas noted  Cultures :  Sputum 9/28 >>normal flora  BC 9/28 >> NEG   ABX:  Vanc 9/30>>>10/2 Ceftaz 9/30>>>  DISCUSSION: 71 y/o female s/p right upper lobectomy for adenocarcinoma with persistent respiratory failure post operatively.   ASSESSMENT / PLAN:  PULMONARY A: Acute respiratory failure with hypoxemia HCAP Pneumothorax R upper lobe adenocarcinoma Difficult airway, airway injury suspected P:   Continue chest tubes to suction Full mechanical vent support VAP prevention Daily WUA/SBT Decrease RR on vent, continue pressure control  CARDIOVASCULAR A:  Septic shock resolved Afib Hypertension P:  Wean off stress dose steroids  RENAL A:   Mild hypernatremia CKD P:   Monitor BMET and UOP Replace electrolytes as needed   GASTROINTESTINAL A:   Coffee ground emesis 10/1, resolved P:   Continue tube feeding  HEMATOLOGIC A:   Anemia without bleeding P:  Monitor for boeeding  INFECTIOUS A:   HCAP P:   Continue ceftaz  ENDOCRINE A:   Hyperglycemia  P:   SSI  NEUROLOGIC A:   Anxiety/agitation P:   RASS goal: -1 Continue fentanyl gtt Continue prn versed Continue risperdal Continue clonazepam   FAMILY  - Updates: None bedside  - Inter-disciplinary family meet or Palliative Care meeting due by:  day 7  My cc time 35 minutes  Roselie Awkward, MD [place] [facility] Pager: [phone] Cell: [phone] After 3pm or if no response, call [phone]    08/08/2017, 11:29 AM

## 2017-08-08 NOTE — Plan of Care (Signed)
Problem: Education: Goal: Knowledge of [facility] Education information/materials will improve Outcome: Not Progressing unbale to educate patient. Still sedated and intubated.

## 2017-08-08 NOTE — Progress Notes (Signed)
Replaced tube holder with help from RN as it was sliding down patients face.

## 2017-08-08 NOTE — Progress Notes (Signed)
Attempting wean again as patient is more awake and alert now and MD feels she can wean now. Patient is tolerating well. Vitals are stable and sats are 100%. RN is aware. Rt will continue to monitor.

## 2017-08-09 ENCOUNTER — Inpatient Hospital Stay (HOSPITAL_COMMUNITY): Payer: Medicare HMO

## 2017-08-09 LAB — GLUCOSE, CAPILLARY
GLUCOSE-CAPILLARY: 120 mg/dL — AB (ref 65–99)
GLUCOSE-CAPILLARY: 126 mg/dL — AB (ref 65–99)
GLUCOSE-CAPILLARY: 131 mg/dL — AB (ref 65–99)
Glucose-Capillary: 135 mg/dL — ABNORMAL HIGH (ref 65–99)
Glucose-Capillary: 122 mg/dL — ABNORMAL HIGH (ref 65–99)
Glucose-Capillary: 132 mg/dL — ABNORMAL HIGH (ref 65–99)

## 2017-08-09 LAB — CBC
HCT: 22.2 % — ABNORMAL LOW (ref 36.0–46.0)
Hemoglobin: 6.9 g/dL — CL (ref 12.0–15.0)
MCH: 28.6 pg (ref 26.0–34.0)
MCHC: 31.1 g/dL (ref 30.0–36.0)
MCV: 92.1 fL (ref 78.0–100.0)
PLATELETS: 269 10*3/uL (ref 150–400)
RBC: 2.41 MIL/uL — AB (ref 3.87–5.11)
RDW: 16.7 % — ABNORMAL HIGH (ref 11.5–15.5)
WBC: 10.4 10*3/uL (ref 4.0–10.5)

## 2017-08-09 LAB — PREPARE RBC (CROSSMATCH)

## 2017-08-09 LAB — COMPREHENSIVE METABOLIC PANEL
ALT: 34 U/L (ref 14–54)
AST: 28 U/L (ref 15–41)
Albumin: 1.9 g/dL — ABNORMAL LOW (ref 3.5–5.0)
Alkaline Phosphatase: 51 U/L (ref 38–126)
Anion gap: 8 (ref 5–15)
BUN: 46 mg/dL — AB (ref 6–20)
CHLORIDE: 114 mmol/L — AB (ref 101–111)
CO2: 28 mmol/L (ref 22–32)
CREATININE: 1.46 mg/dL — AB (ref 0.44–1.00)
Calcium: 7.4 mg/dL — ABNORMAL LOW (ref 8.9–10.3)
GFR calc Af Amer: 41 mL/min — ABNORMAL LOW (ref >60)
GFR, EST NON AFRICAN AMERICAN: 36 mL/min — AB (ref >60)
Glucose, Bld: 154 mg/dL — ABNORMAL HIGH (ref 65–99)
Potassium: 3.3 mmol/L — ABNORMAL LOW (ref 3.5–5.1)
Sodium: 150 mmol/L — ABNORMAL HIGH (ref 135–145)
Total Bilirubin: 0.6 mg/dL (ref 0.3–1.2)
Total Protein: 5.1 g/dL — ABNORMAL LOW (ref 6.5–8.1)

## 2017-08-09 LAB — MAGNESIUM: Magnesium: 1.8 mg/dL (ref 1.7–2.4)

## 2017-08-09 MED ORDER — FREE WATER
200.0000 mL | Freq: Four times a day (QID) | Status: DC
  Administered 2017-08-09 – 2017-08-12 (×11): 200 mL

## 2017-08-09 MED ORDER — SODIUM CHLORIDE 0.9 % IV SOLN
Freq: Once | INTRAVENOUS | Status: AC
  Administered 2017-08-09: 06:00:00 via INTRAVENOUS

## 2017-08-09 MED ORDER — CLONAZEPAM 1 MG PO TABS
1.0000 mg | ORAL_TABLET | Freq: Three times a day (TID) | ORAL | Status: DC
  Administered 2017-08-09 – 2017-08-11 (×5): 1 mg via ORAL
  Filled 2017-08-09 (×4): qty 1

## 2017-08-09 MED ORDER — METOPROLOL TARTRATE 12.5 MG HALF TABLET
12.5000 mg | ORAL_TABLET | Freq: Two times a day (BID) | ORAL | Status: DC
  Administered 2017-08-09 – 2017-08-20 (×23): 12.5 mg via ORAL
  Filled 2017-08-09 (×23): qty 1

## 2017-08-09 NOTE — Progress Notes (Signed)
10 Days Post-Op Procedure(s) (LRB): VIDEO ASSISTED THORACOSCOPY (VATS)/WEDGE RESECTION (Right) LOBECTOMY (Right) Subjective: Stable on vent Min air leak from chest tubes treceived 1 u PRBC for Hb 6.9 Excellent u/o without lasix . I/o neg 1500cc  Objective: Vital signs in last 24 hours: Temp:  [98.5 F (36.9 C)-99.4 F (37.4 C)] 99 F (37.2 C) (10/06 0910) Pulse Rate:  [66-133] 86 (10/06 0910) Cardiac Rhythm: Normal sinus rhythm (10/06 0900) Resp:  [14-30] 16 (10/06 0910) BP: (96-161)/(46-88) 160/77 (10/06 0800) SpO2:  [98 %-100 %] 100 % (10/06 0910) Arterial Line BP: (99-190)/(42-76) 170/68 (10/06 0910) FiO2 (%):  [40 %] 40 % (10/06 0910) Weight:  [187 lb 13.3 oz (85.2 kg)] 187 lb 13.3 oz (85.2 kg) (10/06 0217)  Hemodynamic parameters for last 24 hours: CVP:  [7 mmHg-15 mmHg] 8 mmHg  Intake/Output from previous day: 10/05 0701 - 10/06 0700 In: 1749.2 [I.V.:434.2; Blood:15; NG/GT:1200; IV Piggyback:100] Out: 3110 [Urine:2580; Emesis/NG output:300; Chest Tube:230] Intake/Output this shift: Total I/O In: 489.2 [I.V.:39.2; Blood:350; NG/GT:100] Out: 375 [Urine:375]   comfortable on vent Lungs clear abd soft extrm warm  Lab Results:  Recent Labs  08/08/17 0340 08/09/17 0343  WBC 8.6 10.4  HGB 7.6* 6.9*  HCT 23.9* 22.2*  PLT 290 269   BMET:  Recent Labs  08/08/17 0340 08/09/17 0343  NA 146* 150*  K 3.5 3.3*  CL 110 114*  CO2 27 28  GLUCOSE 148* 154*  BUN 46* 46*  CREATININE 1.60* 1.46*  CALCIUM 7.3* 7.4*    PT/INR: No results for input(s): LABPROT, INR in the last 72 hours. ABG    Component Value Date/Time   PHART 7.557 (H) 08/07/2017 0350   HCO3 30.0 (H) 08/07/2017 0350   TCO2 27 08/07/2017 1700   ACIDBASEDEF 1.4 08/05/2017 0406   O2SAT 99.0 08/07/2017 0350   CBG (last 3)   Recent Labs  08/09/17 0000 08/09/17 0331 08/09/17 0728  GLUCAP 131* 135* 122*    Assessment/Plan: S/P Procedure(s) (LRB): VIDEO ASSISTED THORACOSCOPY (VATS)/WEDGE  RESECTION (Right) LOBECTOMY (Right) anemia  acute resp failure Cont vent wean per CCM   LOS: 10 days    Kathleen Guerrero 08/09/2017

## 2017-08-09 NOTE — Progress Notes (Signed)
PULMONARY / CRITICAL CARE MEDICINE   Name: Kathleen Guerrero MRN: [id number] DOB: 12/09/1945    ADMISSION DATE:  07/30/2017 CONSULTATION DATE:  08/01/17  REFERRING MD:  Roxan Hockey  CHIEF COMPLAINT:  Post operative vent management  HISTORY OF PRESENT ILLNESS:   71 y/o female underwent resection of her RUL on 9/26, developed worsening respiratory failure on 9/28 requiring intubation.    PAST MEDICAL HISTORY :  She  has a past medical history of Arthritis; GERD (gastroesophageal reflux disease); Headache; Hepatitis; History of hiatal hernia; History of loop recorder; Hypertension; Stroke [facility]) (07/2016); and Tobacco use.   SUBJECTIVE:  Weaned yesterday for 4-5 hours Remains agitated, tachycardic   VITAL SIGNS: BP (!) 160/77   Pulse 86   Temp 99 F (37.2 C) (Oral)   Resp 16   Ht 5' 2.75" (1.594 m)   Wt 187 lb 13.3 oz (85.2 kg)   SpO2 100%   BMI 33.54 kg/m   HEMODYNAMICS: CVP:  [7 mmHg-15 mmHg] 8 mmHg  VENTILATOR SETTINGS: Vent Mode: PSV;CPAP FiO2 (%):  [40 %] 40 % Set Rate:  [16 bmp-30 bmp] 16 bmp PEEP:  [5 cmH20] 5 cmH20 Pressure Support:  [5 cmH20-10 cmH20] 5 cmH20 Plateau Pressure:  [14 cmH20-18 cmH20] 17 cmH20  INTAKE / OUTPUT: I/O last 3 completed shifts: In: 3076.1 [I.V.:961.1; Blood:15; NG/GT:2000; IV Piggyback:100] Out: 4395 [Urine:3705; Emesis/NG output:300; Chest Tube:390]  PHYSICAL EXAMINATION:  General:  In bed on vent HENT: NCAT ETT in place PULM: CTA B, no wheezing CV: RRR, no mgr GI: BS+, soft, nontender MSK: normal bulk and tone Neuro: sleepy on vent but follows commands      LABS:  BMET  Recent Labs Lab 08/07/17 0356 08/07/17 1700 08/08/17 0340 08/09/17 0343  NA 144 148* 146* 150*  K 3.3* 2.8* 3.5 3.3*  CL 105 105 110 114*  CO2 29  --  27 28  BUN 42* 40* 46* 46*  CREATININE 1.61* 1.50* 1.60* 1.46*  GLUCOSE 163* 129* 148* 154*    Electrolytes  Recent Labs Lab 08/05/17 0336 08/06/17 0329  08/07/17 0356 08/08/17 0340  08/09/17 0343  CALCIUM 7.1* 7.1*  < > 7.2* 7.3* 7.4*  MG 2.2 2.0  --  1.8 1.7 1.8  PHOS 2.5 1.7*  --  2.5  --   --   < > = values in this interval not displayed.  CBC  Recent Labs Lab 08/07/17 0356 08/07/17 1700 08/08/17 0340 08/09/17 0343  WBC 9.4  --  8.6 10.4  HGB 8.1* 7.5* 7.6* 6.9*  HCT 24.4* 22.0* 23.9* 22.2*  PLT 310  --  290 269    Coag's No results for input(s): APTT, INR in the last 168 hours.  Sepsis Markers  Recent Labs Lab 08/03/17 0353 08/04/17 1255 08/05/17 0336 08/06/17 0329  LATICACIDVEN 0.8 1.3  --   --   PROCALCITON  --  0.29 0.16 0.12    ABG  Recent Labs Lab 08/05/17 0406 08/06/17 0350 08/07/17 0350  PHART 7.468* 7.443 7.557*  PCO2ART 30.5* 38.8 33.7  PO2ART 73.0* 106 122*    Liver Enzymes  Recent Labs Lab 08/06/17 0329 08/08/17 0340 08/09/17 0343  AST 17 35 28  ALT 16 33 34  ALKPHOS 59 54 51  BILITOT 0.5 0.5 0.6  ALBUMIN 2.2* 1.9* 1.9*    Cardiac Enzymes  Recent Labs Lab 08/03/17 2047 08/04/17 0417 08/04/17 1255  TROPONINI 0.19* 0.17* 0.12*    Glucose  Recent Labs Lab 08/08/17 1205 08/08/17 1607 08/08/17 2007 08/09/17  0000 08/09/17 0331 08/09/17 0728  GLUCAP 149* 111* 128* 131* 135* 122*    Imaging Dg Chest Port 1 View  Result Date: 08/09/2017 CLINICAL DATA:  Followup lobectomy. EXAM: PORTABLE CHEST 1 VIEW COMPARISON:  08/08/2017 FINDINGS: Endotracheal tube tip is 2 cm above the carina. Nasogastric tube enters the abdomen. Right internal jugular central line tip in the SVC 2 cm above the right atrium. Two right chest tubes are in place following lobectomy on the right. Small amount of pleural air persists at the apex. No change. IMPRESSION: Small amount of persistent pleural air at the right apex. No change. Electronically Signed   By: Nelson Chimes M.D.   On: 08/09/2017 07:41     SIGNIFICANT EVENTS  9/26> Right sided VATS s/p RUL Lobectomy.  9/28 >Acute hypoxic respiratory failure with new subcutaneous  emphysema>intubated /vent   STUDIES:  PFTs - Pulmonary function tests November 2017 > ratio 74%, FVC 3.19 (100%) FEV1 2.36 (97%) CT Chest 07/09/17> interval progression RUL nodule 60mm to 57mm  CT head 10/2 > NAICP, soft tissue gas noted  Cultures :  Sputum 9/28 >>normal flora  BC 9/28 >> NEG   ABX:  Vanc 9/30>>>10/2 Ceftaz 9/30>>>  DISCUSSION: 71 y/o female s/p right upper lobectomy for adenocarcinoma with persistent respiratory failure post operatively. There is concern for airway injury.    ASSESSMENT / PLAN:  PULMONARY A: Acute respiratory failure with hypoxemia HCAP Pneumothorax R upper lobe adenocarcinoma Difficult airway, airway injury suspected P:   Passing SBT this morning, given risk of stridor from airway injury, we need her to be as calm as possible when we extubate her: wean precedex as much as possible today, need to see her calm on low doses of this Check for cuff leak today Full mechanical vent support VAP prevention Daily WUA/SBT   CARDIOVASCULAR A:  Septic shock resolved Afib Hypertension P:  Continue amiodarone Restart metoprolol Continue amlodipine  RENAL A:   Mild hypernatremia CKD P:   Monitor BMET and UOP Replace electrolytes as needed   GASTROINTESTINAL A:   Coffee ground emesis 10/1, resolved P:   Stop tube feeding  HEMATOLOGIC A:   Anemia without bleeding P:  Monitor for bleeding  INFECTIOUS A:   HCAP P:   Continue ceftaz  ENDOCRINE A:   Hyperglycemia  P:   SSI  NEUROLOGIC A:   Anxiety/agitation P:   RASS goal -1 Continue fentanyl gtt, prn versed, precedex, try to wean down as much as possible Continue Risperdal  Increase Clonazepam   FAMILY  - Updates: updated daughter Curt Bears by phone 10/6, in person on 10/5  - Inter-disciplinary family meet or Palliative Care meeting due by:  day 7  My cc time 35 minutes  Roselie Awkward, MD [place] [facility] Pager: [phone] Cell: [phone] After 3pm or if no  response, call [phone]    08/09/2017, 9:33 AM

## 2017-08-09 NOTE — Progress Notes (Signed)
[facility] Progress Note Patient Name: Kathleen Guerrero DOB: July 11, 1946 MRN: [id number]   Date of Service  08/09/2017  HPI/Events of Note  Hg 6.9  eICU Interventions  Transfuse 1 unit     Intervention Category Major Interventions: Other:  Laine Fonner 08/09/2017, 4:27 AM

## 2017-08-09 NOTE — Progress Notes (Signed)
Patient placed back on full support at this time due to excessive agitation, increased HR and RR. Attempted to check patient for cuff leak when she became agitated almost instantly with marked increase in RR and HR. Of note there was no audible cuff leak.

## 2017-08-09 NOTE — Progress Notes (Signed)
CT surgery p.m. Rounds  Patient did not tolerate pressure support weaning with extreme agitation and increased respiratory rate Now resting comfortably on full support Afebrile with stable hemodynamics

## 2017-08-10 ENCOUNTER — Inpatient Hospital Stay (HOSPITAL_COMMUNITY): Payer: Medicare HMO

## 2017-08-10 DIAGNOSIS — F419 Anxiety disorder, unspecified: Secondary | ICD-10-CM

## 2017-08-10 LAB — GLUCOSE, CAPILLARY
GLUCOSE-CAPILLARY: 122 mg/dL — AB (ref 65–99)
GLUCOSE-CAPILLARY: 140 mg/dL — AB (ref 65–99)
Glucose-Capillary: 131 mg/dL — ABNORMAL HIGH (ref 65–99)
Glucose-Capillary: 134 mg/dL — ABNORMAL HIGH (ref 65–99)
Glucose-Capillary: 158 mg/dL — ABNORMAL HIGH (ref 65–99)
Glucose-Capillary: 148 mg/dL — ABNORMAL HIGH (ref 65–99)

## 2017-08-10 LAB — BASIC METABOLIC PANEL WITH GFR
Anion gap: 8 (ref 5–15)
BUN: 47 mg/dL — ABNORMAL HIGH (ref 6–20)
CO2: 29 mmol/L (ref 22–32)
Calcium: 7.8 mg/dL — ABNORMAL LOW (ref 8.9–10.3)
Chloride: 116 mmol/L — ABNORMAL HIGH (ref 101–111)
Creatinine, Ser: 1.32 mg/dL — ABNORMAL HIGH (ref 0.44–1.00)
GFR calc Af Amer: 47 mL/min — ABNORMAL LOW
GFR calc non Af Amer: 40 mL/min — ABNORMAL LOW
Glucose, Bld: 155 mg/dL — ABNORMAL HIGH (ref 65–99)
Potassium: 3 mmol/L — ABNORMAL LOW (ref 3.5–5.1)
Sodium: 153 mmol/L — ABNORMAL HIGH (ref 135–145)

## 2017-08-10 LAB — BPAM RBC
Blood Product Expiration Date: 201810182359
ISSUE DATE / TIME: 201810060603
Unit Type and Rh: 6200

## 2017-08-10 LAB — CBC
HEMATOCRIT: 27.1 % — AB (ref 36.0–46.0)
HEMOGLOBIN: 8.3 g/dL — AB (ref 12.0–15.0)
MCH: 27.9 pg (ref 26.0–34.0)
MCHC: 30.6 g/dL (ref 30.0–36.0)
MCV: 91.2 fL (ref 78.0–100.0)
Platelets: 253 10*3/uL (ref 150–400)
RBC: 2.97 MIL/uL — AB (ref 3.87–5.11)
RDW: 17.1 % — ABNORMAL HIGH (ref 11.5–15.5)
WBC: 8.7 10*3/uL (ref 4.0–10.5)

## 2017-08-10 LAB — TYPE AND SCREEN
ABO/RH(D): A POS
Antibody Screen: NEGATIVE
UNIT DIVISION: 0

## 2017-08-10 MED ORDER — FUROSEMIDE 10 MG/ML IJ SOLN
20.0000 mg | Freq: Two times a day (BID) | INTRAMUSCULAR | Status: AC
  Administered 2017-08-10 (×2): 20 mg via INTRAVENOUS
  Filled 2017-08-10: qty 2

## 2017-08-10 MED ORDER — DEXAMETHASONE SODIUM PHOSPHATE 10 MG/ML IJ SOLN
4.0000 mg | Freq: Four times a day (QID) | INTRAMUSCULAR | Status: AC
  Administered 2017-08-10 – 2017-08-13 (×14): 4 mg via INTRAVENOUS
  Filled 2017-08-10 (×14): qty 0.4

## 2017-08-10 MED ORDER — FUROSEMIDE 10 MG/ML IJ SOLN: 20.0000 mg | Freq: Two times a day (BID) | INTRAMUSCULAR | Status: DC

## 2017-08-10 MED ORDER — SODIUM CHLORIDE 0.9 % IV BOLUS (SEPSIS)
500.0000 mL | Freq: Once | INTRAVENOUS | Status: AC
  Administered 2017-08-10: 500 mL via INTRAVENOUS

## 2017-08-10 NOTE — Progress Notes (Signed)
Pharmacy Antibiotic Note  Kathleen Guerrero is a 71 y.o. female s/p VATS 9/26 continues on ceftazidime for sepsis/possible HCAP. Renal function improving slowly with CrCl ~64ml/min. Cultures negative thus far, WBC wnl.  Plan: 1) Continue ceftazidime 1g IV q12 2) Follow up LOT  Height: 5' 2.75" (159.4 cm) Weight: 192 lb 3.9 oz (87.2 kg) IBW/kg (Calculated) : 51.83  Temp (24hrs), Avg:99.1 F (37.3 C), Min:98.1 F (36.7 C), Max:99.4 F (37.4 C)   Recent Labs Lab 08/03/17 1200  08/04/17 1255  08/06/17 0329  08/07/17 0356 08/07/17 1700 08/08/17 0340 08/09/17 0343 08/10/17 0600  WBC  --   < >  --   < > 13.5*  --  9.4  --  8.6 10.4 8.7  CREATININE  --   < >  --   < > 1.60*  < > 1.61* 1.50* 1.60* 1.46* 1.32*  LATICACIDVEN  --   --  1.3  --   --   --   --   --   --   --   --   VANCOTROUGH 29*  --   --   --   --   --   --   --   --   --   --   < > = values in this interval not displayed.  Estimated Creatinine Clearance: 41.9 mL/min (A) (by C-G formula based on SCr of 1.32 mg/dL (H)).    No Known Allergies  Antimicrobials this admission: Vancomycin 9/28>>10/2 Tressie Ellis 9/28>> Cefuroxime 9/26>>27  Dose adjustments this admission: 9/30 VT = 29 on 1g q12 > decrease to 1250mg  q24  Microbiology results: 9/28 bldx2:ngtd 9/28 TA: normal flora  Thank you for allowing pharmacy to be a part of this patient's care.  Arrie Senate, PharmD PGY-2 Cardiology Pharmacy Resident Pager: [phone] 08/10/2017

## 2017-08-10 NOTE — Progress Notes (Signed)
Patient assessed for cuff leak and possible extubation. No audible cuff leak was noted as well as not leak noted on ventilator graphics. MD aware. Patient continues to wean of PSV 5/5 and tolerating well.

## 2017-08-10 NOTE — Progress Notes (Addendum)
PULMONARY / CRITICAL CARE MEDICINE   Name: ANGLA DELAHUNT MRN: [id number] DOB: [birth year]    ADMISSION DATE:  07/30/2017 CONSULTATION DATE:  08/01/17  REFERRING MD:  Roxan Hockey  CHIEF COMPLAINT:  Post operative vent management  HISTORY OF PRESENT ILLNESS:   71 y/o female underwent resection of her RUL on 9/26, developed worsening respiratory failure on 9/28 requiring intubation.    PAST MEDICAL HISTORY :  She  has a past medical history of Arthritis; GERD (gastroesophageal reflux disease); Headache; Hepatitis; History of hiatal hernia; History of loop recorder; Hypertension; Stroke [facility]) (07/2016); and Tobacco use.   SUBJECTIVE:  Weaned for several hours yesterday More calm this morning No cuff leak   VITAL SIGNS: BP 126/68   Pulse 75   Temp 99.4 F (37.4 C) (Oral)   Resp (!) 30   Ht 5' 2.75" (1.594 m)   Wt 192 lb 3.9 oz (87.2 kg)   SpO2 98%   BMI 34.33 kg/m   HEMODYNAMICS: CVP:  [4 mmHg-9 mmHg] 8 mmHg  VENTILATOR SETTINGS: Vent Mode: PSV;CPAP FiO2 (%):  [40 %] 40 % Set Rate:  [16 bmp] 16 bmp PEEP:  [5 cmH20] 5 cmH20 Pressure Support:  [5 cmH20] 5 cmH20 Plateau Pressure:  [18 cmH20] 18 cmH20  INTAKE / OUTPUT: I/O last 3 completed shifts: In: 2680.2 [I.V.:728.6; Blood:365; NG/GT:1536.7; IV Piggyback:50] Out: 7989 [QJJHE:1740; Emesis/NG output:750; Chest Tube:570]  PHYSICAL EXAMINATION:  General:  In bed on vent HENT: NCAT ETT in place PULM: CTA B, vent supported breathing CV: RRR, no mgr GI: BS+, soft, nontender MSK: normal bulk and tone Neuro: sedated on vent, will follow commands    LABS:  BMET  Recent Labs Lab 08/08/17 0340 08/09/17 0343 08/10/17 0600  NA 146* 150* 153*  K 3.5 3.3* 3.0*  CL 110 114* 116*  CO2 27 28 29   BUN 46* 46* 47*  CREATININE 1.60* 1.46* 1.32*  GLUCOSE 148* 154* 155*    Electrolytes  Recent Labs Lab 08/05/17 0336 08/06/17 0329  08/07/17 0356 08/08/17 0340 08/09/17 0343 08/10/17 0600  CALCIUM 7.1* 7.1*   < > 7.2* 7.3* 7.4* 7.8*  MG 2.2 2.0  --  1.8 1.7 1.8  --   PHOS 2.5 1.7*  --  2.5  --   --   --   < > = values in this interval not displayed.  CBC  Recent Labs Lab 08/08/17 0340 08/09/17 0343 08/10/17 0600  WBC 8.6 10.4 8.7  HGB 7.6* 6.9* 8.3*  HCT 23.9* 22.2* 27.1*  PLT 290 269 253    Coag's No results for input(s): APTT, INR in the last 168 hours.  Sepsis Markers  Recent Labs Lab 08/04/17 1255 08/05/17 0336 08/06/17 0329  LATICACIDVEN 1.3  --   --   PROCALCITON 0.29 0.16 0.12    ABG  Recent Labs Lab 08/05/17 0406 08/06/17 0350 08/07/17 0350  PHART 7.468* 7.443 7.557*  PCO2ART 30.5* 38.8 33.7  PO2ART 73.0* 106 122*    Liver Enzymes  Recent Labs Lab 08/06/17 0329 08/08/17 0340 08/09/17 0343  AST 17 35 28  ALT 16 33 34  ALKPHOS 59 54 51  BILITOT 0.5 0.5 0.6  ALBUMIN 2.2* 1.9* 1.9*    Cardiac Enzymes  Recent Labs Lab 08/03/17 2047 08/04/17 0417 08/04/17 1255  TROPONINI 0.19* 0.17* 0.12*    Glucose  Recent Labs Lab 08/09/17 1139 08/09/17 1612 08/09/17 1907 08/10/17 0004 08/10/17 0438 08/10/17 0759  GLUCAP 132* 120* 126* 140* 131* 122*    Imaging Dg  Chest Port 1 View  Result Date: 08/10/2017 CLINICAL DATA:  Followup chest tube EXAM: PORTABLE CHEST 1 VIEW COMPARISON:  08/09/2017 FINDINGS: Endotracheal tube 2 cm above the carina. Nasogastric tube enters the abdomen. Right internal jugular central line tip in the SVC above the right atrium. Two right chest tubes. Small right apical pneumothorax is unchanged. Previous right lobectomy. IMPRESSION: Lines and tubes well positioned. No change in small right apical pneumothorax. Electronically Signed   By: Nelson Chimes M.D.   On: 08/10/2017 06:58     SIGNIFICANT EVENTS  9/26> Right sided VATS s/p RUL Lobectomy.  9/28 >Acute hypoxic respiratory failure with new subcutaneous emphysema>intubated /vent   STUDIES:  PFTs - Pulmonary function tests November 2017 > ratio 74%, FVC 3.19 (100%)  FEV1 2.36 (97%) CT Chest 07/09/17> interval progression RUL nodule 42mm to 47mm  CT head 10/2 > NAICP, soft tissue gas noted  Cultures :  Sputum 9/28 >>normal flora  BC 9/28 >> NEG   ABX:  Vanc 9/30>>>10/2 Ceftaz 9/30>>> 10/7  DISCUSSION: 71 y/o female s/p right upper lobectomy for adenocarcinoma with persistent respiratory failure post operatively. There is concern for airway injury.    ASSESSMENT / PLAN:  PULMONARY A: Acute respiratory failure with hypoxemia HCAP Pneumothorax R upper lobe adenocarcinoma Difficult airway, airway injury suspected P:   Vent mechanics and oxygenation favor extubation. However we have concern for airway injury and she has no cuff leak on 10/7.  Anxiety also playing a major role in keeping her intubated.   If she has a cuff leak on 10/8 then extubate, otherwise consider extubation in OR or with emergent tracheostomy team on standby Full mechanical vent support VAP prevention Daily WUA/SBT Start decadron fir airway edema  CARDIOVASCULAR A:  Septic shock resolved Afib Hypertension P:  Continue amiodarone, metoprolol, amlodipine Tele   RENAL A:   Mild hypernatremia CKD P:   Monitor BMET and UOP Replace electrolytes as needed   GASTROINTESTINAL A:   Coffee ground emesis 10/1, resolved P:   Continue tube feeding  HEMATOLOGIC A:   Anemia without bleeding P:  Monitor for bleeding  INFECTIOUS A:   HCAP P:   Stop ceftaz  ENDOCRINE A:   Hyperglycemia  P:   SSI  NEUROLOGIC A:   Anxiety/agitation P:   RASS goal -1 Continue fentanyl, precedex Continue risperdal Continue clonazepam 1mg  q8h    FAMILY  - Updates: updated Curt Bears bedside 10/7 (daughter)  - Inter-disciplinary family meet or Palliative Care meeting due by:  day 7  My cc time 33 minutes  Roselie Awkward, MD [place] [facility] Pager: [phone] Cell: [phone] After 3pm or if no response, call [phone]    08/10/2017, 10:02 AM

## 2017-08-10 NOTE — Progress Notes (Signed)
CT surgery p.m. Rounds  Patient had excellent day today and was on pressure support weaning for several hours with out agitation or tachypnea. However her cuff leak test was positive and she was started on Decadron in preparation for possible extubation tomorrow

## 2017-08-10 NOTE — Progress Notes (Signed)
11 Days Post-Op Procedure(s) (LRB): VIDEO ASSISTED THORACOSCOPY (VATS)/WEDGE RESECTION (Right) LOBECTOMY (Right) Subjective: Tolerating PS weaning Lungs clear abd soft  cxr stable No air leaks  Objective: Vital signs in last 24 hours: Temp:  [98.1 F (36.7 C)-99.4 F (37.4 C)] 99.4 F (37.4 C) (10/07 0756) Pulse Rate:  [59-122] 83 (10/07 0700) Cardiac Rhythm: Normal sinus rhythm (10/07 0747) Resp:  [11-22] 20 (10/07 0700) BP: (115-172)/(54-89) 128/67 (10/07 0600) SpO2:  [99 %-100 %] 99 % (10/07 0833) Arterial Line BP: (118-223)/(47-90) 162/66 (10/07 0700) FiO2 (%):  [40 %] 40 % (10/07 0833) Weight:  [192 lb 3.9 oz (87.2 kg)] 192 lb 3.9 oz (87.2 kg) (10/07 0400)  Hemodynamic parameters for last 24 hours: CVP:  [4 mmHg-9 mmHg] 8 mmHg  Intake/Output from previous day: 10/06 0701 - 10/07 0700 In: 1849.6 [I.V.:513; Blood:350; NG/GT:986.7] Out: 3450 [Urine:2600; Emesis/NG output:450; Chest Tube:400] Intake/Output this shift: No intake/output data recorded.       Exam    General- alert and comfortable on vent   Lungs- clear without rales, wheezes   Cor- regular rate and rhythm, no murmur , gallop   Abdomen- soft, non-tender   Extremities - warm, non-tender, minimal edema   Neuro- oriented, appropriate, no focal weakness   Lab Results:  Recent Labs  08/09/17 0343 08/10/17 0600  WBC 10.4 8.7  HGB 6.9* 8.3*  HCT 22.2* 27.1*  PLT 269 253   BMET:  Recent Labs  08/09/17 0343 08/10/17 0600  NA 150* 153*  K 3.3* 3.0*  CL 114* 116*  CO2 28 29  GLUCOSE 154* 155*  BUN 46* 47*  CREATININE 1.46* 1.32*  CALCIUM 7.4* 7.8*    PT/INR: No results for input(s): LABPROT, INR in the last 72 hours. ABG    Component Value Date/Time   PHART 7.557 (H) 08/07/2017 0350   HCO3 30.0 (H) 08/07/2017 0350   TCO2 27 08/07/2017 1700   ACIDBASEDEF 1.4 08/05/2017 0406   O2SAT 99.0 08/07/2017 0350   CBG (last 3)   Recent Labs  08/10/17 0004 08/10/17 0438 08/10/17 0759   GLUCAP 140* 131* 122*    Assessment/Plan: S/P Procedure(s) (LRB): VIDEO ASSISTED THORACOSCOPY (VATS)/WEDGE RESECTION (Right) LOBECTOMY (Right) Cont current care I/O yesterday - 400 cc but wt up One dose lasix today Vent wean/ extubation per CCM  LOS: 11 days    Tharon Aquas Trigt III 08/10/2017

## 2017-08-11 ENCOUNTER — Inpatient Hospital Stay (HOSPITAL_COMMUNITY): Payer: Medicare HMO

## 2017-08-11 LAB — BASIC METABOLIC PANEL
Anion gap: 12 (ref 5–15)
Anion gap: 8 (ref 5–15)
BUN: 52 mg/dL — ABNORMAL HIGH (ref 6–20)
BUN: 50 mg/dL — AB (ref 6–20)
CHLORIDE: 116 mmol/L — AB (ref 101–111)
CO2: 28 mmol/L (ref 22–32)
CO2: 29 mmol/L (ref 22–32)
Calcium: 8.1 mg/dL — ABNORMAL LOW (ref 8.9–10.3)
Calcium: 8 mg/dL — ABNORMAL LOW (ref 8.9–10.3)
Chloride: 111 mmol/L (ref 101–111)
Creatinine, Ser: 1.27 mg/dL — ABNORMAL HIGH (ref 0.44–1.00)
Creatinine, Ser: 1.22 mg/dL — ABNORMAL HIGH (ref 0.44–1.00)
GFR calc Af Amer: 49 mL/min — ABNORMAL LOW (ref >60)
GFR calc non Af Amer: 42 mL/min — ABNORMAL LOW (ref >60)
GFR calc non Af Amer: 44 mL/min — ABNORMAL LOW (ref >60)
GFR, EST AFRICAN AMERICAN: 51 mL/min — AB (ref >60)
Glucose, Bld: 175 mg/dL — ABNORMAL HIGH (ref 65–99)
Glucose, Bld: 145 mg/dL — ABNORMAL HIGH (ref 65–99)
POTASSIUM: 3.4 mmol/L — AB (ref 3.5–5.1)
Potassium: 2.8 mmol/L — ABNORMAL LOW (ref 3.5–5.1)
SODIUM: 153 mmol/L — AB (ref 135–145)
Sodium: 151 mmol/L — ABNORMAL HIGH (ref 135–145)

## 2017-08-11 LAB — CBC
HCT: 27.9 % — ABNORMAL LOW (ref 36.0–46.0)
Hemoglobin: 8.6 g/dL — ABNORMAL LOW (ref 12.0–15.0)
MCH: 27.9 pg (ref 26.0–34.0)
MCHC: 30.8 g/dL (ref 30.0–36.0)
MCV: 90.6 fL (ref 78.0–100.0)
Platelets: 247 10*3/uL (ref 150–400)
RBC: 3.08 MIL/uL — ABNORMAL LOW (ref 3.87–5.11)
RDW: 15.9 % — ABNORMAL HIGH (ref 11.5–15.5)
WBC: 7.2 10*3/uL (ref 4.0–10.5)

## 2017-08-11 LAB — GLUCOSE, CAPILLARY
GLUCOSE-CAPILLARY: 159 mg/dL — AB (ref 65–99)
GLUCOSE-CAPILLARY: 164 mg/dL — AB (ref 65–99)
Glucose-Capillary: 121 mg/dL — ABNORMAL HIGH (ref 65–99)
Glucose-Capillary: 134 mg/dL — ABNORMAL HIGH (ref 65–99)
Glucose-Capillary: 145 mg/dL — ABNORMAL HIGH (ref 65–99)
Glucose-Capillary: 152 mg/dL — ABNORMAL HIGH (ref 65–99)
Glucose-Capillary: 156 mg/dL — ABNORMAL HIGH (ref 65–99)

## 2017-08-11 LAB — PHOSPHORUS: PHOSPHORUS: 3.2 mg/dL (ref 2.5–4.6)

## 2017-08-11 MED ORDER — CLONAZEPAM 1 MG PO TABS
1.0000 mg | ORAL_TABLET | Freq: Two times a day (BID) | ORAL | Status: DC
  Administered 2017-08-11 – 2017-08-13 (×5): 1 mg via ORAL
  Filled 2017-08-11 (×5): qty 1

## 2017-08-11 MED ORDER — VITAL HIGH PROTEIN PO LIQD
1000.0000 mL | ORAL | Status: DC
  Administered 2017-08-11 – 2017-08-12 (×2): 1000 mL

## 2017-08-11 NOTE — Progress Notes (Signed)
Upon assessment patient pulled cortrak tube out. VSS. Will continue to monitor.

## 2017-08-11 NOTE — Progress Notes (Signed)
12 Days Post-Op Procedure(s) (LRB): VIDEO ASSISTED THORACOSCOPY (VATS)/WEDGE RESECTION (Right) LOBECTOMY (Right) Subjective: Asleep at present Has been more alert and following commands  Objective: Vital signs in last 24 hours: Temp:  [98.3 F (36.8 C)-99.5 F (37.5 C)] 99.5 F (37.5 C) (10/08 0400) Pulse Rate:  [56-99] 59 (10/08 0700) Cardiac Rhythm: Normal sinus rhythm (10/08 0400) Resp:  [9-30] 24 (10/08 0700) BP: (107-146)/(55-73) 131/71 (10/08 0700) SpO2:  [97 %-100 %] 99 % (10/08 0700) Arterial Line BP: (69-177)/(44-75) 154/65 (10/08 0700) FiO2 (%):  [40 %] 40 % (10/08 0400) Weight:  [185 lb 13.6 oz (84.3 kg)] 185 lb 13.6 oz (84.3 kg) (10/08 0500)  Hemodynamic parameters for last 24 hours:    Intake/Output from previous day: 10/07 0701 - 10/08 0700 In: 2203.6 [I.V.:303.6; NG/GT:1250; IV Piggyback:650] Out: 6160 [Urine:3255; Emesis/NG output:550; Stool:50; Chest Tube:85] Intake/Output this shift: No intake/output data recorded.  General appearance: no distress Neurologic: no focal deficit Heart: regular rate and rhythm Lungs: clear to auscultation bilaterally no air leak  Lab Results:  Recent Labs  08/10/17 0600 08/11/17 0435  WBC 8.7 7.2  HGB 8.3* 8.6*  HCT 27.1* 27.9*  PLT 253 247   BMET:  Recent Labs  08/10/17 0600 08/11/17 0435  NA 153* 151*  K 3.0* 2.8*  CL 116* 111  CO2 29 28  GLUCOSE 155* 175*  BUN 47* 52*  CREATININE 1.32* 1.27*  CALCIUM 7.8* 8.1*    PT/INR: No results for input(s): LABPROT, INR in the last 72 hours. ABG    Component Value Date/Time   PHART 7.557 (H) 08/07/2017 0350   HCO3 30.0 (H) 08/07/2017 0350   TCO2 27 08/07/2017 1700   ACIDBASEDEF 1.4 08/05/2017 0406   O2SAT 99.0 08/07/2017 0350   CBG (last 3)   Recent Labs  08/10/17 2018 08/10/17 2353 08/11/17 0338  GLUCAP 148* 164* 159*    Assessment/Plan: S/P Procedure(s) (LRB): VIDEO ASSISTED THORACOSCOPY (VATS)/WEDGE RESECTION (Right) LOBECTOMY  (Right) -NEURO- asleep at present but per RN- more calm and following commands  -CV- stable  -RESP- VDRF. Had a god day yesterday but not extubated due to cuff leak  Plan per CCM- may have to extubate without a cuff leak  -RENAL- creatinine better  Hypernatremia- getting free water  hypokalemia- supplement K  BUN up no need for further diuresis  -ENDO- CBG reasonably controlled  Anemia- stable  ID- afebrile off antibiotics   LOS: 12 days    [name] 08/11/2017

## 2017-08-11 NOTE — Progress Notes (Signed)
Cortrak Tube Team Note:  Consult received to place a Cortrak feeding tube.   A 10 F Cortrak tube was placed in the left nare and secured with a nasal bridle at 65 cm. Per the Cortrak monitor reading the tube tip is gastric.   No x-ray is required. RN may begin using tube.   If the tube becomes dislodged please keep the tube and contact the Cortrak team at [email or web address] (password [email or web address]) for replacement.  If after hours and replacement cannot be delayed, place a NG tube and confirm placement with an abdominal x-ray.    Koleen Distance MS, RD, LDN Pager #- [phone] After Hours Pager: [phone]

## 2017-08-11 NOTE — Progress Notes (Signed)
SLP Cancellation Note  Patient Details Name: Kathleen Guerrero MRN: [id number] DOB: [birth year]   Cancelled treatment:       Reason Eval/Treat Not Completed: Patient not medically ready. Visited pt and RN; pt not yet ready for swallow assessment given prolonged intubation with extubation today and severe dysphonia. Will follow for readiness tomorrow for assessment. Advised RN to place Cortrak to allow time post intubation for swallowing recovery.    Simmie Camerer, Katherene Ponto 08/11/2017, 2:23 PM

## 2017-08-11 NOTE — Progress Notes (Signed)
Chaplain met this patient and daughter and friend while rounding the floor this morning.  Family requested prayer. Chaplain happy to pray with family.  Will remain available for additional support.      08/11/17 0946  Clinical Encounter Type  Visited With Patient;Family  Visit Type Initial;Psychological support;Spiritual support;Social support

## 2017-08-11 NOTE — Progress Notes (Signed)
PULMONARY / CRITICAL CARE MEDICINE   Name: Kathleen Guerrero MRN: [id number] DOB: 10-17-1946    ADMISSION DATE:  07/30/2017 CONSULTATION DATE:  08/01/17  REFERRING MD:  Roxan Hockey  CHIEF COMPLAINT:  Post operative vent management  HISTORY OF PRESENT ILLNESS:   71 y/o female underwent resection of her RUL on 9/26, developed worsening respiratory failure on 9/28 requiring intubation.    PAST MEDICAL HISTORY :  She  has a past medical history of Arthritis; GERD (gastroesophageal reflux disease); Headache; Hepatitis; History of hiatal hernia; History of loop recorder; Hypertension; Stroke [facility]) (07/2016); and Tobacco use.   SUBJECTIVE:  Looks great on SBT.  Weaned well yesterday. Awake, appropriate.    VITAL SIGNS: BP 137/70 (BP Location: Right Arm)   Pulse 71   Temp 98.7 F (37.1 C) (Oral)   Resp 17   Ht 5' 2.75" (1.594 m)   Wt 84.3 kg (185 lb 13.6 oz)   SpO2 99%   BMI 33.18 kg/m   HEMODYNAMICS:    VENTILATOR SETTINGS: Vent Mode: CPAP;PSV FiO2 (%):  [40 %] 40 % Set Rate:  [16 bmp] 16 bmp PEEP:  [5 cmH20] 5 cmH20 Pressure Support:  [5 cmH20] 5 cmH20 Plateau Pressure:  [16 cmH20-17 cmH20] 17 cmH20  INTAKE / OUTPUT: I/O last 3 completed shifts: In: 2918 [I.V.:581.4; NG/GT:1686.7; IV Piggyback:650] Out: 2774 [Urine:4080; Emesis/NG output:800; Stool:50; Chest Tube:325]  PHYSICAL EXAMINATION:  General:  In bed on PS, NAD  HENT: NCAT ETT in place PULM: resps even non labored on PS 5/5, clear  CV: RRR, no mgr GI: BS+, soft, nontender MSK: normal bulk and tone Neuro: awake, appropriate, follows commands     LABS:  BMET  Recent Labs Lab 08/09/17 0343 08/10/17 0600 08/11/17 0435  NA 150* 153* 151*  K 3.3* 3.0* 2.8*  CL 114* 116* 111  CO2 28 29 28   BUN 46* 47* 52*  CREATININE 1.46* 1.32* 1.27*  GLUCOSE 154* 155* 175*    Electrolytes  Recent Labs Lab 08/05/17 0336 08/06/17 0329  08/07/17 0356 08/08/17 0340 08/09/17 0343 08/10/17 0600  08/11/17 0435  CALCIUM 7.1* 7.1*  < > 7.2* 7.3* 7.4* 7.8* 8.1*  MG 2.2 2.0  --  1.8 1.7 1.8  --   --   PHOS 2.5 1.7*  --  2.5  --   --   --   --   < > = values in this interval not displayed.  CBC  Recent Labs Lab 08/09/17 0343 08/10/17 0600 08/11/17 0435  WBC 10.4 8.7 7.2  HGB 6.9* 8.3* 8.6*  HCT 22.2* 27.1* 27.9*  PLT 269 253 247    Coag's No results for input(s): APTT, INR in the last 168 hours.  Sepsis Markers  Recent Labs Lab 08/04/17 1255 08/05/17 0336 08/06/17 0329  LATICACIDVEN 1.3  --   --   PROCALCITON 0.29 0.16 0.12    ABG  Recent Labs Lab 08/05/17 0406 08/06/17 0350 08/07/17 0350  PHART 7.468* 7.443 7.557*  PCO2ART 30.5* 38.8 33.7  PO2ART 73.0* 106 122*    Liver Enzymes  Recent Labs Lab 08/06/17 0329 08/08/17 0340 08/09/17 0343  AST 17 35 28  ALT 16 33 34  ALKPHOS 59 54 51  BILITOT 0.5 0.5 0.6  ALBUMIN 2.2* 1.9* 1.9*    Cardiac Enzymes  Recent Labs Lab 08/04/17 1255  TROPONINI 0.12*    Glucose  Recent Labs Lab 08/10/17 1155 08/10/17 1616 08/10/17 2018 08/10/17 2353 08/11/17 0338 08/11/17 0812  GLUCAP 134* 158* 148* 164* 159*  156*    Imaging Dg Chest Port 1 View  Result Date: 08/11/2017 CLINICAL DATA:  Acute respiratory failure, intubated patient, chest tube treatment. Former smoker. Status post wedge resection/ lobectomy on the right on July 30, 2017. EXAM: PORTABLE CHEST 1 VIEW COMPARISON:  Portable chest x-ray of August 10, 2017 FINDINGS: There is a persistent small approximately 10% apical pneumothorax on the right. The 2 right-sided chest tubes remain with the tip of the upper tube projecting over the posterior aspect of the fifth rib and the lower chest tube over the posterior aspect of the seventh rib. There is no pleural effusion. On the left the interstitial markings remain coarse. There is no pneumothorax or pleural effusion. The heart is top-normal in size. There is tortuosity of the ascending and  descending thoracic aorta. The endotracheal tube tip lies approximately 2.4 cm above the carina. The feeding tube tip can the esophagogastric tube project below the inferior margin of the image. IMPRESSION: Stable approximately 10% right apical pneumothorax. The support tubes are in reasonable position. Stable coarse lung markings on the left especially inferiorly which may reflect mild interstitial edema dx or subsegmental atelectasis. Thoracic aortic atherosclerosis. Electronically Signed   By: David  Martinique M.D.   On: 08/11/2017 07:28     SIGNIFICANT EVENTS  9/26> Right sided VATS s/p RUL Lobectomy.  9/28 >Acute hypoxic respiratory failure with new subcutaneous emphysema>intubated /vent   STUDIES:  PFTs - Pulmonary function tests November 2017 > ratio 74%, FVC 3.19 (100%) FEV1 2.36 (97%) CT Chest 07/09/17> interval progression RUL nodule 67mm to 5mm  CT head 10/2 > NAICP, soft tissue gas noted  Cultures :  Sputum 9/28 >>normal flora  BC 9/28 >> NEG   ABX:  Vanc 9/30>>>10/2 Ceftaz 9/30>>> 10/7  DISCUSSION: 71 y/o female s/p right upper lobectomy for adenocarcinoma with persistent respiratory failure post operatively. There is concern for airway injury.    ASSESSMENT / PLAN:  PULMONARY A: Acute respiratory failure with hypoxemia HCAP Pneumothorax R upper lobe adenocarcinoma Difficult airway, airway injury suspected P:   Extubate  Pulmonary hygiene  Mobilize if ok with CVTS  Continue decadron today for concern for airway edema  F/u CXR  Chest tubes per CVTS - still has residual R ptx   CARDIOVASCULAR A:  Septic shock resolved Afib HTN P:  Continue amiodarone, metoprolol, amlodipine, clonidine D/c clonidine first once BP levels out - was added for BP and to assist with agitation on vent   Tele   RENAL A:   Mild hypernatremia CKD Hypokalemia  P:   Monitor BMET and UOP Replace K  Check phos    GASTROINTESTINAL A:   Coffee ground emesis 10/1,  resolved P:   NPO for now  Speech to assess post extubation   HEMATOLOGIC A:   Anemia without bleeding P:  Monitor for bleeding Lovenox   INFECTIOUS A:   HCAP P:   S/p 8 days abx  monitor wbc, fever curve off abx   ENDOCRINE A:   Hyperglycemia  P:   SSI  NEUROLOGIC A:   Anxiety/agitation - improving.  P:   Extubate  D/c fentanyl, precedex  Wean clonazepam slowly - changed to BID from q8h 10/8 Continue risperdal for now      FAMILY  - Updates: updated Curt Bears bedside 10/8 (daughter)  - Inter-disciplinary family meet or Palliative Care meeting due by:  day [place], NP 08/11/2017  10:39 AM Pager: [phone] or [phone]  Attending Note:  71 year old female s/p cardiac surgery that remained intubated due to upper airway concerns as well as agitation.  Over the past few days we were able to control agitation.  Diuresed well.  No cuff leak on exam with clear lungs.  I reviewed CXR myself, ETT ok.  Will proceed with trial extubation today.  Family is aware that if fails is likely due to upper airway concerns and that she may need a trach but would like to give a trial extubation first.  Continue steroids.  Continue volume negative.  PCCM will continue to follow but plan to extubate today.  The patient is critically ill with multiple organ systems failure and requires high complexity decision making for assessment and support, frequent evaluation and titration of therapies, application of advanced monitoring technologies and extensive interpretation of multiple databases.   Critical Care Time devoted to patient care services described in this note is  35  Minutes. This time reflects time of care of this signee Dr Jennet Maduro. This critical care time does not reflect procedure time, or teaching time or supervisory time of PA/NP/Med student/Med Resident etc but could involve care discussion time.  Rush Farmer, M.D. [facility] Pulmonary/Critical Care  Medicine. Pager: [phone]. After hours pager: [phone].

## 2017-08-11 NOTE — Progress Notes (Signed)
      [address]       ,[place] [id number]             [phone]      Extubated earlier today  Hoarse  Alert and cooperative  BP 140/62   Pulse 86   Temp 98.1 F (36.7 C) (Oral)   Resp 19   Ht 5' 2.75" (1.594 m)   Wt 185 lb 13.6 oz (84.3 kg)   SpO2 95%   BMI 33.18 kg/m    Intake/Output Summary (Last 24 hours) at 08/11/17 1755 Last data filed at 08/11/17 1700  Gross per 24 hour  Intake             1348 ml  Output             2790 ml  Net            -1442 ml    Remo Lipps C. Roxan Hockey, MD [facility] [phone]

## 2017-08-11 NOTE — Procedures (Signed)
Extubation Procedure Note  Patient Details:   Name: MILAN CLARE DOB: [birth year] MRN: [id number]   Airway Documentation:     Evaluation  O2 sats: stable throughout Complications: No apparent complications Patient did tolerate procedure well. Bilateral Breath Sounds: Clear   Yes   Patient extubated to 2lnc. Vital signs stable at this time. Patient tolerated well.  Daughter and RN at bedside. RT will continue to monitor.  Mcneil Sober 08/11/2017, 9:34 AM

## 2017-08-12 ENCOUNTER — Inpatient Hospital Stay (HOSPITAL_COMMUNITY): Payer: Medicare HMO

## 2017-08-12 DIAGNOSIS — R1310 Dysphagia, unspecified: Secondary | ICD-10-CM

## 2017-08-12 LAB — GLUCOSE, CAPILLARY
GLUCOSE-CAPILLARY: 152 mg/dL — AB (ref 65–99)
GLUCOSE-CAPILLARY: 159 mg/dL — AB (ref 65–99)
Glucose-Capillary: 116 mg/dL — ABNORMAL HIGH (ref 65–99)
Glucose-Capillary: 149 mg/dL — ABNORMAL HIGH (ref 65–99)
Glucose-Capillary: 140 mg/dL — ABNORMAL HIGH (ref 65–99)

## 2017-08-12 LAB — BASIC METABOLIC PANEL
ANION GAP: 9 (ref 5–15)
BUN: 55 mg/dL — AB (ref 6–20)
CO2: 28 mmol/L (ref 22–32)
CREATININE: 1.2 mg/dL — AB (ref 0.44–1.00)
Calcium: 8.1 mg/dL — ABNORMAL LOW (ref 8.9–10.3)
Chloride: 116 mmol/L — ABNORMAL HIGH (ref 101–111)
GFR calc non Af Amer: 45 mL/min — ABNORMAL LOW (ref >60)
GFR, EST AFRICAN AMERICAN: 52 mL/min — AB (ref >60)
Glucose, Bld: 169 mg/dL — ABNORMAL HIGH (ref 65–99)
Potassium: 3.2 mmol/L — ABNORMAL LOW (ref 3.5–5.1)
Sodium: 153 mmol/L — ABNORMAL HIGH (ref 135–145)

## 2017-08-12 LAB — CBC
HCT: 29.3 % — ABNORMAL LOW (ref 36.0–46.0)
Hemoglobin: 9 g/dL — ABNORMAL LOW (ref 12.0–15.0)
MCH: 28.1 pg (ref 26.0–34.0)
MCHC: 30.7 g/dL (ref 30.0–36.0)
MCV: 91.6 fL (ref 78.0–100.0)
PLATELETS: 274 10*3/uL (ref 150–400)
RBC: 3.2 MIL/uL — ABNORMAL LOW (ref 3.87–5.11)
RDW: 16 % — AB (ref 11.5–15.5)
WBC: 13.4 10*3/uL — AB (ref 4.0–10.5)

## 2017-08-12 LAB — MAGNESIUM: Magnesium: 1.6 mg/dL — ABNORMAL LOW (ref 1.7–2.4)

## 2017-08-12 MED ORDER — MUPIROCIN CALCIUM 2 % EX CREA
TOPICAL_CREAM | Freq: Every day | CUTANEOUS | Status: DC
  Administered 2017-08-13 – 2017-08-16 (×4): via TOPICAL
  Administered 2017-08-17: 1 via TOPICAL
  Administered 2017-08-18: 08:00:00 via TOPICAL
  Administered 2017-08-19: 1 via TOPICAL
  Administered 2017-08-20: 11:00:00 via TOPICAL
  Filled 2017-08-12 (×2): qty 15

## 2017-08-12 MED ORDER — FREE WATER
300.0000 mL | Freq: Four times a day (QID) | Status: DC
  Administered 2017-08-12 – 2017-08-19 (×28): 300 mL

## 2017-08-12 MED ORDER — POTASSIUM CHLORIDE 20 MEQ/15ML (10%) PO SOLN
40.0000 meq | Freq: Three times a day (TID) | ORAL | Status: AC
  Administered 2017-08-12 (×2): 40 meq
  Filled 2017-08-12 (×2): qty 30

## 2017-08-12 MED ORDER — MAGNESIUM SULFATE 2 GM/50ML IV SOLN
2.0000 g | Freq: Once | INTRAVENOUS | Status: AC
  Administered 2017-08-12: 2 g via INTRAVENOUS
  Filled 2017-08-12: qty 50

## 2017-08-12 MED ORDER — LEVALBUTEROL HCL 0.63 MG/3ML IN NEBU
0.6300 mg | INHALATION_SOLUTION | Freq: Three times a day (TID) | RESPIRATORY_TRACT | Status: DC
  Administered 2017-08-12 – 2017-08-19 (×24): 0.63 mg via RESPIRATORY_TRACT
  Filled 2017-08-12 (×24): qty 3

## 2017-08-12 NOTE — Progress Notes (Signed)
Modified Barium Swallow Progress Note  Patient Details  Name: Kathleen Guerrero MRN: [id number] Date of Birth: 1946/04/29  Today's Date: 08/12/2017  Modified Barium Swallow completed.  Full report located under Chart Review in the Imaging Section.  Brief recommendations include the following:  Clinical Impression  Pt exhibits moderate-severe oropharyngeal dysphagia with sensorimotor based impairments. Diminished oral cohesion, delayed transit and lingual residue describe oral phase. Honey thick barium entered pt's larygneal vestibule prior to hyoid elevation initiated. Reduced hyolaryngeal excursion led to decreased epiglottic inversion also appeared to be impeded by NG tube. Pilar Plate amount of honey thick barium aspirated during the swallow without sensation. Mod-max vallecular residue incompletely cleared with cued second swallow. No po's recommended at present. Continue alternate means with oral care and continued ST intervention for facilitation toward safe po intake.    Swallow Evaluation Recommendations       SLP Diet Recommendations: NPO       Medication Administration: Via alternative means               Oral Care Recommendations: Oral care QID        Houston Siren 08/12/2017,1:45 PM   Orbie Pyo [place].Ed [employer] [phone]

## 2017-08-12 NOTE — Progress Notes (Signed)
Patient ID: Kathleen Guerrero, female   DOB: [birth year], 71 y.o.   MRN: [id number]  SICU Evening Rounds  Hemodynamically stable in sinus rhyhtm.  sats 99% on [place] 4L.  She is awake but slowed mentation. Able to answer some questions for me. Says she is tired.

## 2017-08-12 NOTE — Progress Notes (Signed)
Upon assessment patient pulled aline out. Site level 0, pressure dressing applied.  VSS. Will continue to monitor.

## 2017-08-12 NOTE — Progress Notes (Signed)
Patient enjoys prayer.  Stopped in this morning to follow up with patient.  Not sure what all the patient is trying to communicate however I did make out the word prayer.  Chaplain happy to pray with patient as often as she would like.  Chaplain will continue to follow up as with patient and her daughter.    08/12/17 1023  Clinical Encounter Type  Visited With Patient  Visit Type Follow-up;Psychological support;Spiritual support;Social support  Spiritual Encounters  Spiritual Needs Prayer

## 2017-08-12 NOTE — Progress Notes (Signed)
Notified by PT of lump/wound on the backside of patients head. Upon assessment wound raised, with dark middle. Foam bandage applied for cushion and wound care consult placed Will continue to monitor.

## 2017-08-12 NOTE — Evaluation (Signed)
Clinical/Bedside Swallow Evaluation Patient Details  Name: Kathleen Guerrero MRN: [id number] Date of Birth: May 05, 1946  Today's Date: 08/12/2017 Time: SLP Start Time (ACUTE ONLY): 5361 SLP Stop Time (ACUTE ONLY): 4431 SLP Time Calculation (min) (ACUTE ONLY): 20 min  Past Medical History:  Past Medical History:  Diagnosis Date  . Arthritis   . GERD (gastroesophageal reflux disease)    tums  . Headache   . Hepatitis    cmv 30 yrs ago hepatitis  . History of hiatal hernia   . History of loop recorder    after stroke  . Hypertension   . Stroke ([place]) 07/2016   balance a little off  . Tobacco use    Past Surgical History:  Past Surgical History:  Procedure Laterality Date  . APPENDECTOMY    . DERMOID CYST  EXCISION     x2 28'  . EP IMPLANTABLE DEVICE N/A 07/23/2016   Procedure: Loop Recorder Insertion;  Surgeon: Thompson Grayer, MD;  Location: [place];  Service: Cardiovascular;  Laterality: N/A;  . Intestinal obstruction  1979  . LOBECTOMY Right 07/30/2017   Procedure: LOBECTOMY;  Surgeon: [name], MD;  Location: [facility];  Service: Thoracic;  Laterality: Right;  . TEE WITHOUT CARDIOVERSION N/A 07/23/2016   Procedure: TRANSESOPHAGEAL ECHOCARDIOGRAM (TEE);  Surgeon: Lelon Perla, MD;  Location: [facility] ENDOSCOPY;  Service: Cardiovascular;  Laterality: N/A;  . VIDEO ASSISTED THORACOSCOPY (VATS)/WEDGE RESECTION Right 07/30/2017   Procedure: VIDEO ASSISTED THORACOSCOPY (VATS)/WEDGE RESECTION;  Surgeon: [name], MD;  Location: [facility] OR;  Service: Thoracic;  Laterality: Right;   HPI:  Pt with planned thoracoscopy wedge resection for RUL nodule 07/30/17. Required intubation 9/28 due to acute hypoxic respiratory failure. Pt's ETT was not maintaining cuff pressure and she required emergent anesthesia for tube change. Extubated 10/8 and pt pulled Cortrak tube. CXR Right chest tubes in place ; no convincing evidence of residual pneumothorax. Right-sided postoperative  volume loss and atelectasis. PMH: CVA 2017, GERD, HTN.   Assessment / Plan / Recommendation Clinical Impression  Pt exhibiting indications of poor airway protection following 11 day intubation. Attempted 3 oz water test with hand over hand feeding assist and only able to consume single sips resulting in immediate cough. Vocal quality breathy with little vocal cord adduction suspected and low intensity. FEES most appropriate assessment however pt very hesitant/reluctant after described procedure. RN stated pt could go off floor for MBS accompanied by an Therapist, sports. MBS scheduled for 10:30 today.  SLP Visit Diagnosis: Dysphagia, unspecified (R13.10)    Aspiration Risk  Moderate aspiration risk    Diet Recommendation NPO        Other  Recommendations Oral Care Recommendations: Oral care QID   Follow up Recommendations  (TBD)      Frequency and Duration            Prognosis        Swallow Study   General HPI: Pt with planned thoracoscopy wedge resection for RUL nodule 07/30/17. Required intubation 9/28 due to acute hypoxic respiratory failure. Pt's ETT was not maintaining cuff pressure and she required emergent anesthesia for tube change. Extubated 10/8 and pt pulled Cortrak tube. CXR Right chest tubes in place ; no convincing evidence of residual pneumothorax. Right-sided postoperative volume loss and atelectasis. PMH: CVA 2017, GERD, HTN. Type of Study: Bedside Swallow Evaluation Previous Swallow Assessment:  (none) Diet Prior to this Study: NPO;NG Tube Temperature Spikes Noted: No Respiratory Status: Nasal cannula History of Recent Intubation: Yes  Length of Intubations (days): 11 days Date extubated: 08/11/17 Behavior/Cognition: Alert;Cooperative;Pleasant mood Oral Cavity Assessment: Dry Oral Care Completed by SLP: Yes Oral Cavity - Dentition: Adequate natural dentition Vision: Functional for self-feeding Self-Feeding Abilities: Able to feed self;Needs assist;Needs set up Patient  Positioning: Upright in bed Baseline Vocal Quality: Low vocal intensity;Breathy Volitional Cough: Strong Volitional Swallow: Able to elicit    Oral/Motor/Sensory Function Overall Oral Motor/Sensory Function: Within functional limits   Ice Chips Ice chips: Not tested   Thin Liquid Thin Liquid: Impaired Presentation: Cup Oral Phase Impairments:  (none) Oral Phase Functional Implications:  (none) Pharyngeal  Phase Impairments: Cough - Immediate    Nectar Thick Nectar Thick Liquid: Not tested   Honey Thick Honey Thick Liquid: Not tested   Puree Puree: Within functional limits   Solid   GO   Solid: Not tested        Houston Siren 08/12/2017,9:11 AM   Orbie Pyo [place] [phone]

## 2017-08-12 NOTE — Progress Notes (Signed)
Rehab Admissions Coordinator Note:  Patient was screened by Retta Diones for appropriateness for an Inpatient Acute Rehab Consult.  Noted PT recommending CIR.  At this time, we are recommending Inpatient Rehab consult.  Jodell Cipro M 08/12/2017, 2:06 PM  I can be reached at [phone].

## 2017-08-12 NOTE — Consult Note (Addendum)
WOC consult requested for area on posterior head.  Pt has a history of dermoid cyst excisions, according to H&P.  When questioned regarding the location, she states this was present prior to admission.  Appearance is unusual, and consistent with another cyst. Affected area is 1.2X1.2cm, red and raised above skin level, in the center it is dark brown and dry, .2X.2cm. This is not a pressure injury and topical treatment will not be effective to promote healing. Recommend follow-up with a dermatologist after discharge and informed patient that there is not one available for inpatients at [facility].  Bactroban and foam dressing to protect affected area from further injury and to avoid shear/bleeding when moving around. Pt verbalized understanding regarding plan of care. Please re-consult if further assistance is needed.  Thank-you,  Julien Girt MSN, [place], [place], [place], [place]

## 2017-08-12 NOTE — Progress Notes (Signed)
PULMONARY / CRITICAL CARE MEDICINE   Name: Kathleen Guerrero MRN: [id number] DOB: June 02, 1946    ADMISSION DATE:  07/30/2017 CONSULTATION DATE:  08/01/17  REFERRING MD:  Roxan Hockey  CHIEF COMPLAINT:  Post operative vent management  HISTORY OF PRESENT ILLNESS:   71 y/o female underwent resection of her RUL on 9/26, developed worsening respiratory failure on 9/28 requiring intubation.    SUBJECTIVE:  Extubated yesterday, multiple episodes of confusion and agitation overnight, remains hoarse and failed swallow evaluation.  VITAL SIGNS: BP (!) 144/67   Pulse 85   Temp (!) 97.4 F (36.3 C) (Oral)   Resp 19   Ht 5' 2.75" (1.594 m)   Wt 85.3 kg (188 lb 0.8 oz)   SpO2 97%   BMI 33.58 kg/m   HEMODYNAMICS:    VENTILATOR SETTINGS:    INTAKE / OUTPUT: I/O last 3 completed shifts: In: 1921.6 [I.V.:171.6; NG/GT:1450; IV Piggyback:300] Out: 5027 [Urine:3255; Emesis/NG output:400; Stool:250; Chest Tube:110]  PHYSICAL EXAMINATION:  General:  In bed, awake, very weak HENT: [place]/AT, PERRL, EOM-I and MMM PULM: Hoarse with weak cough, transmitted upper airway sounds CV: RRR, Nl S1/S2, -M/R/G. GI: Soft, NT, ND and +BS MSK: normal bulk and tone Neuro: awake, appropriate, follows commands, moving all ext to command  LABS:  BMET  Recent Labs Lab 08/11/17 0435 08/11/17 1732 08/12/17 0342  NA 151* 153* 153*  K 2.8* 3.4* 3.2*  CL 111 116* 116*  CO2 28 29 28   BUN 52* 50* 55*  CREATININE 1.27* 1.22* 1.20*  GLUCOSE 175* 145* 169*   Electrolytes  Recent Labs Lab 08/06/17 0329  08/07/17 0356 08/08/17 0340 08/09/17 0343  08/11/17 0435 08/11/17 1044 08/11/17 1732 08/12/17 0342  CALCIUM 7.1*  < > 7.2* 7.3* 7.4*  < > 8.1*  --  8.0* 8.1*  MG 2.0  --  1.8 1.7 1.8  --   --   --   --   --   PHOS 1.7*  --  2.5  --   --   --   --  3.2  --   --   < > = values in this interval not displayed.  CBC  Recent Labs Lab 08/10/17 0600 08/11/17 0435 08/12/17 0342  WBC 8.7 7.2 13.4*   HGB 8.3* 8.6* 9.0*  HCT 27.1* 27.9* 29.3*  PLT 253 247 274   Coag's No results for input(s): APTT, INR in the last 168 hours.  Sepsis Markers  Recent Labs Lab 08/06/17 0329  PROCALCITON 0.12   ABG  Recent Labs Lab 08/06/17 0350 08/07/17 0350  PHART 7.443 7.557*  PCO2ART 38.8 33.7  PO2ART 106 122*   Liver Enzymes  Recent Labs Lab 08/06/17 0329 08/08/17 0340 08/09/17 0343  AST 17 35 28  ALT 16 33 34  ALKPHOS 59 54 51  BILITOT 0.5 0.5 0.6  ALBUMIN 2.2* 1.9* 1.9*   Cardiac Enzymes No results for input(s): TROPONINI, PROBNP in the last 168 hours.  Glucose  Recent Labs Lab 08/11/17 1145 08/11/17 1618 08/11/17 1958 08/11/17 2344 08/12/17 0326 08/12/17 0734  GLUCAP 152* 121* 134* 145* 152* 116*   Imaging Dg Chest Portable 1 View  Result Date: 08/12/2017 CLINICAL DATA:  Extubated.  Respiratory failure. EXAM: PORTABLE CHEST 1 VIEW COMPARISON:  1 day prior FINDINGS: Loop recorder projecting over the left sided chest. Feeding tube extends beyond the inferior aspect of the film. Extubation. Normal heart size. Right hemidiaphragm elevation. Surgical changes about the right perihilar region. 2 right chest tubes are unchanged in  position. No residual pneumothorax identified. No pleural fluid. Persistent right sided volume loss and presumed postoperative atelectasis. Clear left lung. IMPRESSION: Right chest tubes in place ; no convincing evidence of residual pneumothorax. Right-sided postoperative volume loss and atelectasis. Extubation. Electronically Signed   By: Abigail Miyamoto M.D.   On: 08/12/2017 07:20   SIGNIFICANT EVENTS  9/26> Right sided VATS s/p RUL Lobectomy.  9/28 >Acute hypoxic respiratory failure with new subcutaneous emphysema>intubated /vent   STUDIES:  PFTs - Pulmonary function tests November 2017 > ratio 74%, FVC 3.19 (100%) FEV1 2.36 (97%) CT Chest 07/09/17> interval progression RUL nodule 35mm to 20mm  CT head 10/2 > NAICP, soft tissue gas  noted  Cultures :  Sputum 9/28 >>normal flora  BC 9/28 >> NEG   ABX:  Vanc 9/30>>>10/2 Ceftaz 9/30>>> 10/7  DISCUSSION: 71 y/o female s/p right upper lobectomy for adenocarcinoma with persistent respiratory failure post operatively. There is concern for airway injury.    ASSESSMENT / PLAN:  PULMONARY A: Acute respiratory failure with hypoxemia HCAP Pneumothorax R upper lobe adenocarcinoma Difficult airway, airway injury suspected P:   Monitor closely for airway protection since continues to fail swallow evaluation Pulmonary hygiene  PT to mobilize Continue decadron today for concern for airway edema, will continue for a total of 3 days (10/10 is the last day) F/u CXR  Chest tubes per CVTS - one to be removed tday  CARDIOVASCULAR A:  Septic shock resolved Afib HTN P:  Continue amiodarone, metoprolol, amlodipine, clonidine D/c clonidine first once BP levels out - was added for BP and to assist with agitation on vent   Tele monitoring  RENAL A:   Mild hypernatremia CKD Hypokalemia  P:   Monitor BMET and UOP Replace K  MG and Phos in AM Free water 300 ml q6 via NGT  GASTROINTESTINAL A:   Coffee ground emesis 10/1, resolved P:   NPO for now  Speech to assess (failed yesterday)  HEMATOLOGIC A:   Anemia without bleeding P:  Monitor for bleeding Lovenox   INFECTIOUS A:   HCAP P:   S/p 8 days abx  Monitor wbc, fever curve off abx   ENDOCRINE A:   Hyperglycemia  P:   SSI  NEUROLOGIC A:   Anxiety/agitation - improving.  P:   Extubate  D/c fentanyl, precedex  Wean clonazepam slowly - changed to BID from q8h 10/8, will continue for now given overnight events. Continue risperdal for now   FAMILY  - Updates: Patient and daughter updated bedside.  Hold in the ICU for concern for airway protection and concern for needing reintubation.  Patient was a very bad airway and would rather not move out and have to intubate emergently on the floor.  -  Inter-disciplinary family meet or Palliative Care meeting due by:  day 7  The patient is critically ill with multiple organ systems failure and requires high complexity decision making for assessment and support, frequent evaluation and titration of therapies, application of advanced monitoring technologies and extensive interpretation of multiple databases.   Critical Care Time devoted to patient care services described in this note is  35  Minutes. This time reflects time of care of this signee Dr Jennet Maduro. This critical care time does not reflect procedure time, or teaching time or supervisory time of PA/NP/Med student/Med Resident etc but could involve care discussion time.  Rush Farmer, M.D. [facility] Pulmonary/Critical Care Medicine. Pager: [phone]. After hours pager: [phone].

## 2017-08-12 NOTE — Evaluation (Signed)
Physical Therapy Evaluation Patient Details Name: Kathleen Guerrero MRN: [id number] DOB: Mar 20, 1946 Today's Date: 08/12/2017   History of Present Illness  71 y/o female underwent resection of her RUL on 9/26, developed worsening respiratory failure on 9/28 requiring intubation with extubation 10/8. PMHx: tobacco abuse (one pack per day 50 years), hypertension, dyslipidemia, and a stroke  Clinical Impression  Pt pleasant, soft spoken who is willing to mobilize but generally weak from prolonged bedrest with ventilation. Pt confused regarding date and situation with some random statements at times but overall following simple commands with 90% accuracy and increased time. Pt with decreased strength, balance, transfers, cardiopulmonary function and gait who will benefit from acute therapy to maximize mobility, independence and strength to decrease burden of care.   SpO2 96% on 1.5L HR 83    Follow Up Recommendations CIR;Supervision/Assistance - 24 hour    Equipment Recommendations  None recommended by PT    Recommendations for Other Services OT consult     Precautions / Restrictions Precautions Precautions: Fall Precaution Comments: 2 chest tubes      Mobility  Bed Mobility Overal bed mobility: Needs Assistance Bed Mobility: Rolling;Sidelying to Sit;Sit to Supine Rolling: Mod assist Sidelying to sit: Max assist;+2 for safety/equipment   Sit to supine: Max assist;+2 for safety/equipment   General bed mobility comments: cues for sequence with multimodal cues and hand over hand assist to reach for rail, roll right and assist to elevate trunk and bring legs off of bed. Assist for trunk control and bil LE elevation for return to bed  Transfers Overall transfer level: Needs assistance   Transfers: Sit to/from Stand Sit to Stand: Max assist;+2 physical assistance;From elevated surface         General transfer comment: max assist with bil knees blocked and bil UE support to provide  anterior translation and assist with rise from surface. Pt stood x 2 trials of grossly 30 sec each with maintained flexed posture despite cues and assist for hip extension. Unable to transfer OOB as chest tube to be pulled  Ambulation/Gait                Stairs            Wheelchair Mobility    Modified Rankin (Stroke Patients Only)       Balance Overall balance assessment: Needs assistance   Sitting balance-Leahy Scale: Poor Sitting balance - Comments: pt initially max assist for balance due to posterior left lean. After 2 min with ability to grasp foot board with left hand pt able to achieve minguard sitting balance x 5 min with cues for posture and midline Postural control: Posterior lean;Right lateral lean Standing balance support: Bilateral upper extremity supported;During functional activity Standing balance-Leahy Scale: Zero                               Pertinent Vitals/Pain Pain Assessment: No/denies pain    Home Living Family/patient expects to be discharged to:: Private residence Living Arrangements: Alone Available Help at Discharge: Family;Available 24 hours/day Type of Home: House Home Access: Stairs to enter Entrance Stairs-Rails: Right;Left;Can reach both Entrance Stairs-Number of Steps: 6 Home Layout: One level Home Equipment: Grab bars - toilet;Grab bars - tub/shower;Walker - 2 wheels;Cane - single point;Shower seat      Prior Function Level of Independence: Independent         Comments: 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, retail  Hand Dominance        Extremity/Trunk Assessment   Upper Extremity Assessment Upper Extremity Assessment: Generalized weakness    Lower Extremity Assessment Lower Extremity Assessment: Generalized weakness    Cervical / Trunk Assessment Cervical / Trunk Assessment: Kyphotic  Communication   Communication: No difficulties  Cognition Arousal/Alertness: Awake/alert Behavior  During Therapy: WFL for tasks assessed/performed Overall Cognitive Status: Impaired/Different from baseline Area of Impairment: Orientation;Memory;Following commands;Safety/judgement                 Orientation Level: Disoriented to;Time;Place   Memory: Decreased short-term memory Following Commands: Follows one step commands consistently;Follows one step commands with increased time Safety/Judgement: Decreased awareness of deficits     General Comments: Pt stating she is in [place]. Pt very soft spoken with increased time for all commands      General Comments      Exercises     Assessment/Plan    PT Assessment Patient needs continued PT services  PT Problem List Decreased strength;Decreased mobility;Decreased safety awareness;Decreased activity tolerance;Decreased balance;Decreased knowledge of use of DME;Decreased cognition;Cardiopulmonary status limiting activity       PT Treatment Interventions Gait training;Therapeutic exercise;Patient/family education;Stair training;Balance training;Functional mobility training;Neuromuscular re-education;Cognitive remediation;Therapeutic activities;DME instruction    PT Goals (Current goals can be found in the Care Plan section)  Acute Rehab PT Goals Patient Stated Goal: return home PT Goal Formulation: With patient Time For Goal Achievement: 08/26/17 Potential to Achieve Goals: Good    Frequency Min 3X/week   Barriers to discharge Decreased caregiver support      Co-evaluation               AM-PAC PT "6 Clicks" Daily Activity  Outcome Measure Difficulty turning over in bed (including adjusting bedclothes, sheets and blankets)?: Unable Difficulty moving from lying on back to sitting on the side of the bed? : Unable Difficulty sitting down on and standing up from a chair with arms (e.g., wheelchair, bedside commode, etc,.)?: Unable Help needed moving to and from a bed to chair (including a wheelchair)?:  Total Help needed walking in hospital room?: Total Help needed climbing 3-5 steps with a railing? : Total 6 Click Score: 6    End of Session Equipment Utilized During Treatment: Oxygen Activity Tolerance: Patient tolerated treatment well Patient left: in bed;with call bell/phone within reach;with nursing/sitter in room Nurse Communication: Mobility status;Precautions;Other (comment) (noted wound on posterior scalp) PT Visit Diagnosis: Other abnormalities of gait and mobility (R26.89);Difficulty in walking, not elsewhere classified (R26.2);Muscle weakness (generalized) (M62.81)    Time: 7169-6789 PT Time Calculation (min) (ACUTE ONLY): 23 min   Charges:   PT Evaluation $PT Eval Moderate Complexity: 1 Mod PT Treatments $Therapeutic Activity: 8-22 mins   PT G Codes:        Elwyn Reach, PT [phone]   [place] 08/12/2017, 1:28 PM

## 2017-08-12 NOTE — Progress Notes (Signed)
13 Days Post-Op Procedure(s) (LRB): VIDEO ASSISTED THORACOSCOPY (VATS)/WEDGE RESECTION (Right) LOBECTOMY (Right) Subjective: Alert, wants water Denies pain  Objective: Vital signs in last 24 hours: Temp:  [97.4 F (36.3 C)-98.6 F (37 C)] 97.4 F (36.3 C) (10/09 0736) Pulse Rate:  [68-95] 85 (10/09 0600) Cardiac Rhythm: Normal sinus rhythm (10/09 0400) Resp:  [14-29] 19 (10/09 0600) BP: (110-158)/(54-72) 144/67 (10/09 0600) SpO2:  [94 %-99 %] 97 % (10/09 0600) Arterial Line BP: (99-183)/(41-103) 164/59 (10/09 0600) Weight:  [188 lb 0.8 oz (85.3 kg)] 188 lb 0.8 oz (85.3 kg) (10/09 0400)  Hemodynamic parameters for last 24 hours:    Intake/Output from previous day: 10/08 0701 - 10/09 0700 In: 1276.4 [I.V.:26.4; NG/GT:950; IV Piggyback:300] Out: 2675 [Urine:2275; Emesis/NG output:100; Stool:250; Chest Tube:50] Intake/Output this shift: No intake/output data recorded.  General appearance: alert, cooperative and no distress Neurologic: oriented, no focal deficit but generalized weakness Heart: regular rate and rhythm Lungs: clear to auscultation bilaterally no air leak  Lab Results:  Recent Labs  08/11/17 0435 08/12/17 0342  WBC 7.2 13.4*  HGB 8.6* 9.0*  HCT 27.9* 29.3*  PLT 247 274   BMET:  Recent Labs  08/11/17 1732 08/12/17 0342  NA 153* 153*  K 3.4* 3.2*  CL 116* 116*  CO2 29 28  GLUCOSE 145* 169*  BUN 50* 55*  CREATININE 1.22* 1.20*  CALCIUM 8.0* 8.1*    PT/INR: No results for input(s): LABPROT, INR in the last 72 hours. ABG    Component Value Date/Time   PHART 7.557 (H) 08/07/2017 0350   HCO3 30.0 (H) 08/07/2017 0350   TCO2 27 08/07/2017 1700   ACIDBASEDEF 1.4 08/05/2017 0406   O2SAT 99.0 08/07/2017 0350   CBG (last 3)   Recent Labs  08/11/17 2344 08/12/17 0326 08/12/17 0734  GLUCAP 145* 152* 116*    Assessment/Plan: S/P Procedure(s) (LRB): VIDEO ASSISTED THORACOSCOPY (VATS)/WEDGE RESECTION (Right) LOBECTOMY (Right) -  NEURO-  delirium dramatically improved  Slow wean of clonipine per CCM  CV- stable  RESP- VDRF resolved.   Air leak resolved- dc medial chest tube  Continue nebs  Voice still weak- on decadron- will defer to CCM  Dc one chest tube  ID- afebrile off antibiotics  WBC up slightly- likely secondary to steroids  RENAL- creatinine stable  Hypernatremia- increase free water  Hypokalemia- supplement  GI/ Nutrition- Cor-track in place and on TF  Swallow assessment when ready  Deconditioning- OOB, PT consult  DVT prophylaxis- SCD + enoxaparin   LOS: 13 days    [name] 08/12/2017

## 2017-08-12 NOTE — Plan of Care (Signed)
Problem: Safety: Goal: Ability to remain free from injury will improve Outcome: Progressing Pt has remained free from injury, however pt is confused. RN continues to educate on the importance of staying in bed and bed alarm is in place.   Problem: Activity: Goal: Risk for activity intolerance will decrease Outcome: Progressing Pt stood at bedside with physical therapy today.

## 2017-08-13 ENCOUNTER — Inpatient Hospital Stay (HOSPITAL_COMMUNITY): Payer: Medicare HMO

## 2017-08-13 ENCOUNTER — Telehealth: Payer: Self-pay | Admitting: Cardiology

## 2017-08-13 DIAGNOSIS — E876 Hypokalemia: Secondary | ICD-10-CM

## 2017-08-13 DIAGNOSIS — G7281 Critical illness myopathy: Secondary | ICD-10-CM

## 2017-08-13 DIAGNOSIS — R0902 Hypoxemia: Secondary | ICD-10-CM

## 2017-08-13 LAB — BASIC METABOLIC PANEL
ANION GAP: 10 (ref 5–15)
BUN: 52 mg/dL — ABNORMAL HIGH (ref 6–20)
CALCIUM: 8.3 mg/dL — AB (ref 8.9–10.3)
CHLORIDE: 117 mmol/L — AB (ref 101–111)
CO2: 28 mmol/L (ref 22–32)
Creatinine, Ser: 1.06 mg/dL — ABNORMAL HIGH (ref 0.44–1.00)
GFR calc non Af Amer: 52 mL/min — ABNORMAL LOW (ref >60)
Glucose, Bld: 166 mg/dL — ABNORMAL HIGH (ref 65–99)
POTASSIUM: 4 mmol/L (ref 3.5–5.1)
Sodium: 155 mmol/L — ABNORMAL HIGH (ref 135–145)

## 2017-08-13 LAB — GLUCOSE, CAPILLARY
GLUCOSE-CAPILLARY: 123 mg/dL — AB (ref 65–99)
GLUCOSE-CAPILLARY: 133 mg/dL — AB (ref 65–99)
GLUCOSE-CAPILLARY: 170 mg/dL — AB (ref 65–99)
Glucose-Capillary: 139 mg/dL — ABNORMAL HIGH (ref 65–99)
Glucose-Capillary: 125 mg/dL — ABNORMAL HIGH (ref 65–99)
Glucose-Capillary: 119 mg/dL — ABNORMAL HIGH (ref 65–99)

## 2017-08-13 LAB — CBC
HEMATOCRIT: 32.2 % — AB (ref 36.0–46.0)
HEMOGLOBIN: 9.7 g/dL — AB (ref 12.0–15.0)
MCH: 28.2 pg (ref 26.0–34.0)
MCHC: 30.1 g/dL (ref 30.0–36.0)
MCV: 93.6 fL (ref 78.0–100.0)
Platelets: 275 10*3/uL (ref 150–400)
RBC: 3.44 MIL/uL — AB (ref 3.87–5.11)
RDW: 15.7 % — ABNORMAL HIGH (ref 11.5–15.5)
WBC: 11.1 10*3/uL — AB (ref 4.0–10.5)

## 2017-08-13 LAB — PHOSPHORUS: PHOSPHORUS: 4 mg/dL (ref 2.5–4.6)

## 2017-08-13 LAB — MAGNESIUM: Magnesium: 2.1 mg/dL (ref 1.7–2.4)

## 2017-08-13 MED ORDER — RISPERIDONE 1 MG/ML PO SOLN
0.5000 mg | Freq: Two times a day (BID) | ORAL | Status: DC
  Administered 2017-08-13 (×2): 0.5 mg
  Filled 2017-08-13 (×3): qty 0.5

## 2017-08-13 MED ORDER — INSULIN ASPART 100 UNIT/ML ~~LOC~~ SOLN
2.0000 [IU] | SUBCUTANEOUS | Status: DC
Start: 2017-08-13 — End: 2017-08-14
  Administered 2017-08-13: 2 [IU] via SUBCUTANEOUS
  Administered 2017-08-13: 4 [IU] via SUBCUTANEOUS
  Administered 2017-08-13 – 2017-08-14 (×2): 2 [IU] via SUBCUTANEOUS

## 2017-08-13 NOTE — Progress Notes (Signed)
14 Days Post-Op Procedure(s) (LRB): VIDEO ASSISTED THORACOSCOPY (VATS)/WEDGE RESECTION (Right) LOBECTOMY (Right) Subjective: C/o mouth being dry. Denies pain.  Objective: Vital signs in last 24 hours: Temp:  [97.6 F (36.4 C)-98.5 F (36.9 C)] 98 F (36.7 C) (10/10 0806) Pulse Rate:  [72-107] 81 (10/10 0700) Cardiac Rhythm: Normal sinus rhythm (10/09 2000) Resp:  [13-38] 16 (10/10 0700) BP: (108-176)/(59-99) 152/76 (10/10 0500) SpO2:  [92 %-100 %] 100 % (10/10 0806) Arterial Line BP: (144-173)/(61-65) 144/61 (10/09 1200) Weight:  [183 lb 13.8 oz (83.4 kg)] 183 lb 13.8 oz (83.4 kg) (10/10 0500)  Hemodynamic parameters for last 24 hours:    Intake/Output from previous day: 10/09 0701 - 10/10 0700 In: 1980 [NG/GT:1650; IV Piggyback:200] Out: 1324 [Urine:875; Chest Tube:190] Intake/Output this shift: No intake/output data recorded.  General appearance: alert, cooperative and no distress Neurologic: improved strength, voice still hoarse Heart: regular rate and rhythm Lungs: clear to auscultation bilaterally Abdomen: normal findings: soft, non-tender no air leak  Lab Results:  Recent Labs  08/12/17 0342 08/13/17 0422  WBC 13.4* 11.1*  HGB 9.0* 9.7*  HCT 29.3* 32.2*  PLT 274 275   BMET:  Recent Labs  08/12/17 0342 08/13/17 0422  NA 153* 155*  K 3.2* 4.0  CL 116* 117*  CO2 28 28  GLUCOSE 169* 166*  BUN 55* 52*  CREATININE 1.20* 1.06*  CALCIUM 8.1* 8.3*    PT/INR: No results for input(s): LABPROT, INR in the last 72 hours. ABG    Component Value Date/Time   PHART 7.557 (H) 08/07/2017 0350   HCO3 30.0 (H) 08/07/2017 0350   TCO2 27 08/07/2017 1700   ACIDBASEDEF 1.4 08/05/2017 0406   O2SAT 99.0 08/07/2017 0350   CBG (last 3)   Recent Labs  08/12/17 2028 08/13/17 0023 08/13/17 0407  GLUCAP 140* 133* 170*    Assessment/Plan: S/P Procedure(s) (LRB): VIDEO ASSISTED THORACOSCOPY (VATS)/WEDGE RESECTION (Right) LOBECTOMY (Right) -  NEURO- delirium  resolved, calm and interactive  CV- mildly hypertensive. Maintaining SR  RESP- no air leak- will check CXR with CT on water seal and possibly dc tube later today  RENAL- creatinine continues to improve  Hypokalemia corrected  Hypernatremia slightly worse despite free water  ENDO- CBG mildly elevated  SCD + enoxaparin for DVT prophylaxis  Protein calorie malnutrition- continue TF until able to take PO  Deconditioning severe- may be a candidate for inpatient rehab- will consult rehab medicine   LOS: 14 days    [name] 08/13/2017

## 2017-08-13 NOTE — Progress Notes (Signed)
  Speech Language Pathology Treatment: Dysphagia  Patient Details Name: Kathleen Guerrero MRN: [id number] DOB: [birth year] Today's Date: 08/13/2017 Time: 3500-9381 SLP Time Calculation (min) (ACUTE ONLY): 22 min  Assessment / Plan / Recommendation Clinical Impression  Oral decontamination/infection control provided to remove diffuse debris on hard and soft palate. Pt noted to be a mouth breather at rest. Humidified O2 in place. Ice chips provided to assist in lubricating oral mucosa. Initiated Expiratory muscle strength training aimed at strengthening muscles for improved cough, pharyngeal contraction to initiate po's. EMST 150 trainer used set at 30 (no manometry- no filters) with significant verbal/verbal instruction needed to coordinate exhalation on mouthpiece. No evidence that pt is able to adequately exhale against resistance of EMT device and pt reports an effort level of 10/10. SLP will attempt other expiratory device that has a lower resistance. Overall she appears very deconditioned (sat up in recliner this am). ST will continue intervention.    HPI HPI: Pt with planned thoracoscopy wedge resection for RUL nodule 07/30/17. Required intubation 9/28 due to acute hypoxic respiratory failure. Pt's ETT was not maintaining cuff pressure and she required emergent anesthesia for tube change. Extubated 10/8 and pt pulled Cortrak tube. CXR Right chest tubes in place ; no convincing evidence of residual pneumothorax. Right-sided postoperative volume loss and atelectasis. PMH: CVA 2017, GERD, HTN. Objective assessment recommended after BSE to fully assess swallow function (SLP rec'd FEES however pt preferred MBS).      SLP Plan  Continue with current plan of care       Recommendations  Diet recommendations: NPO Medication Administration: Via alternative means                Oral Care Recommendations: Oral care QID Follow up Recommendations: Inpatient Rehab SLP Visit Diagnosis: Dysphagia,  pharyngoesophageal phase (R13.14) Plan: Continue with current plan of care       GO                [place] Siren 08/13/2017, 11:52 AM   Orbie Pyo Colvin Caroli[employer] [phone]

## 2017-08-13 NOTE — Progress Notes (Signed)
PULMONARY / CRITICAL CARE MEDICINE   Name: Kathleen Guerrero MRN: [id number] DOB: 1946-07-08    ADMISSION DATE:  07/30/2017 CONSULTATION DATE:  08/01/17  REFERRING MD:  Roxan Hockey  CHIEF COMPLAINT:  Post operative vent management  HISTORY OF PRESENT ILLNESS:   71 y/o female underwent resection of her RUL on 9/26, developed worsening respiratory failure on 9/28 requiring intubation.    SUBJECTIVE:  Continues to improve. Anxiety better. May be able to go to rehab once chest tube out and passes wallow eval.  VITAL SIGNS: BP (!) 152/76   Pulse 99   Temp 98 F (36.7 C) (Oral)   Resp 16   Ht 5' 2.75" (1.594 m)   Wt 183 lb 13.8 oz (83.4 kg)   SpO2 100%   BMI 32.83 kg/m   HEMODYNAMICS:    VENTILATOR SETTINGS:    INTAKE / OUTPUT: I/O last 3 completed shifts: In: 2330 [Other:130; NG/GT:2000; IV Piggyback:200] Out: 2515 [Urine:2275; Chest Tube:240]  PHYSICAL EXAMINATION:  General:  Frail stunned female HEENT: MM pink/moist, FT nare OEU:MPNTI affect Neuro: MAEx4 to command CV: s1s2 rrr, no m/r/g PULM: even/non-labored, lungs bilaterally diminished  RW:ERXV, non-tender, bsx4 active TF at goal Extremities: warm/dry, + edema  Skin: no rashes or lesions   LABS:  BMET  Recent Labs Lab 08/11/17 1732 08/12/17 0342 08/13/17 0422  NA 153* 153* 155*  K 3.4* 3.2* 4.0  CL 116* 116* 117*  CO2 29 28 28   BUN 50* 55* 52*  CREATININE 1.22* 1.20* 1.06*  GLUCOSE 145* 169* 166*   Electrolytes  Recent Labs Lab 08/07/17 0356  08/09/17 0343  08/11/17 1044 08/11/17 1732 08/12/17 0342 08/13/17 0422  CALCIUM 7.2*  < > 7.4*  < >  --  8.0* 8.1* 8.3*  MG 1.8  < > 1.8  --   --   --  1.6* 2.1  PHOS 2.5  --   --   --  3.2  --   --  4.0  < > = values in this interval not displayed.  CBC  Recent Labs Lab 08/11/17 0435 08/12/17 0342 08/13/17 0422  WBC 7.2 13.4* 11.1*  HGB 8.6* 9.0* 9.7*  HCT 27.9* 29.3* 32.2*  PLT 247 274 275   Coag's No results for input(s): APTT, INR  in the last 168 hours.  Sepsis Markers No results for input(s): LATICACIDVEN, PROCALCITON, O2SATVEN in the last 168 hours. ABG  Recent Labs Lab 08/07/17 0350  PHART 7.557*  PCO2ART 33.7  PO2ART 122*   Liver Enzymes  Recent Labs Lab 08/08/17 0340 08/09/17 0343  AST 35 28  ALT 33 34  ALKPHOS 54 51  BILITOT 0.5 0.6  ALBUMIN 1.9* 1.9*   Cardiac Enzymes No results for input(s): TROPONINI, PROBNP in the last 168 hours.  Glucose  Recent Labs Lab 08/12/17 1139 08/12/17 1641 08/12/17 2028 08/13/17 0023 08/13/17 0407 08/13/17 0851  GLUCAP 149* 159* 140* 133* 170* 139*   Imaging Dg Chest Port 1 View  Result Date: 08/13/2017 CLINICAL DATA:  Chest tube and status post lobectomy of lung. EXAM: PORTABLE CHEST 1 VIEW COMPARISON:  08/12/2017 FINDINGS: One right chest tube has been removed. There is still one right chest tube remaining. Surgical changes are noted in the right lung. No significant pneumothorax. Stable appearance of the heart and mediastinal tissues. Prominent soft tissue along the right side of the mediastinum is unchanged. Left arm PICC line tip is near the superior cavoatrial junction. Patchy densities at left lung base. Feeding tube extends into the  abdomen. Again noted is a cardiac loop recorder. IMPRESSION: Removal of one right chest tube.  Negative for pneumothorax. Small patchy densities in the left lower lung are nonspecific but could represent atelectasis. Support apparatuses as described. Electronically Signed   By: Markus Daft M.D.   On: 08/13/2017 08:02   Dg Swallowing Func-speech Pathology  Result Date: 08/12/2017 Objective Swallowing Evaluation: Type of Study: MBS-Modified Barium Swallow Study Patient Details Name: Kathleen Guerrero MRN: [id number] Date of Birth: 1946-09-04 Today's Date: 08/12/2017 Time: SLP Start Time (ACUTE ONLY): 1046-SLP Stop Time (ACUTE ONLY): 1101 SLP Time Calculation (min) (ACUTE ONLY): 15 min Past Medical History: Past Medical History:  Diagnosis Date . Arthritis  . GERD (gastroesophageal reflux disease)   tums . Headache  . Hepatitis   cmv 30 yrs ago hepatitis . History of hiatal hernia  . History of loop recorder   after stroke . Hypertension  . Stroke ([place]) 07/2016  balance a little off . Tobacco use  Past Surgical History: Past Surgical History: Procedure Laterality Date . APPENDECTOMY   . DERMOID CYST  EXCISION    x2 41' . EP IMPLANTABLE DEVICE N/A 07/23/2016  Procedure: Loop Recorder Insertion;  Surgeon: Thompson Grayer, MD;  Location: [facility];  Service: Cardiovascular;  Laterality: N/A; . Intestinal obstruction  1979 . LOBECTOMY Right 07/30/2017  Procedure: LOBECTOMY;  Surgeon: [name], MD;  Location: [facility];  Service: Thoracic;  Laterality: Right; . TEE WITHOUT CARDIOVERSION N/A 07/23/2016  Procedure: TRANSESOPHAGEAL ECHOCARDIOGRAM (TEE);  Surgeon: Lelon Perla, MD;  Location: [facility];  Service: Cardiovascular;  Laterality: N/A; . VIDEO ASSISTED THORACOSCOPY (VATS)/WEDGE RESECTION Right 07/30/2017  Procedure: VIDEO ASSISTED THORACOSCOPY (VATS)/WEDGE RESECTION;  Surgeon: [name], MD;  Location: [facility] OR;  Service: Thoracic;  Laterality: Right; HPI: Pt with planned thoracoscopy wedge resection for RUL nodule 07/30/17. Required intubation 9/28 due to acute hypoxic respiratory failure. Pt's ETT was not maintaining cuff pressure and she required emergent anesthesia for tube change. Extubated 10/8 and pt pulled Cortrak tube. CXR Right chest tubes in place ; no convincing evidence of residual pneumothorax. Right-sided postoperative volume loss and atelectasis. PMH: CVA 2017, GERD, HTN. Objective assessment recommended after BSE to fully assess swallow function (SLP rec'd FEES however pt preferred MBS). No Data Recorded Assessment / Plan / Recommendation CHL IP CLINICAL IMPRESSIONS 08/12/2017 Clinical Impression Pt exhibits moderate-severe oropharyngeal dysphagia with sensorimotor based impairments. Diminished  oral cohesion, delayed transit and lingual residue describe oral phase. Honey thick barium entered pt's larygneal vestibule prior to hyoid elevation initiated. Reduced hyolaryngeal excursion led to decreased epiglottic inversion also appeared to be impeded by NG tube. Pilar Plate amount of honey thick barium aspirated during the swallow without sensation. Mod-max vallecular residue incompletely cleared with cued second swallow. No po's recommended at present. Continue alternate means with oral care and continued ST intervention for facilitation toward safe po intake.  SLP Visit Diagnosis Dysphagia, pharyngoesophageal phase (R13.14) Attention and concentration deficit following -- Frontal lobe and executive function deficit following -- Impact on safety and function Severe aspiration risk   CHL IP TREATMENT RECOMMENDATION 08/12/2017 Treatment Recommendations Therapy as outlined in treatment plan below   No flowsheet data found. CHL IP DIET RECOMMENDATION 08/12/2017 SLP Diet Recommendations NPO Liquid Administration via -- Medication Administration Via alternative means Compensations -- Postural Changes --   CHL IP OTHER RECOMMENDATIONS 08/12/2017 Recommended Consults -- Oral Care Recommendations Oral care QID Other Recommendations --   CHL IP FOLLOW UP RECOMMENDATIONS 08/12/2017 Follow up Recommendations (  No Data)   CHL IP FREQUENCY AND DURATION 08/12/2017 Speech Therapy Frequency (ACUTE ONLY) min 2x/week Treatment Duration 2 weeks      CHL IP ORAL PHASE 08/12/2017 Oral Phase Impaired Oral - Pudding Teaspoon -- Oral - Pudding Cup -- Oral - Honey Teaspoon Decreased bolus cohesion;Delayed oral transit;Lingual/palatal residue Oral - Honey Cup NT Oral - Nectar Teaspoon -- Oral - Nectar Cup -- Oral - Nectar Straw -- Oral - Thin Teaspoon -- Oral - Thin Cup -- Oral - Thin Straw -- Oral - Puree Decreased bolus cohesion;Lingual/palatal residue;Delayed oral transit Oral - Mech Soft -- Oral - Regular -- Oral - Multi-Consistency -- Oral -  Pill -- Oral Phase - Comment --  CHL IP PHARYNGEAL PHASE 08/12/2017 Pharyngeal Phase Impaired Pharyngeal- Pudding Teaspoon -- Pharyngeal -- Pharyngeal- Pudding Cup -- Pharyngeal -- Pharyngeal- Honey Teaspoon Penetration/Aspiration before swallow;Delayed swallow initiation-pyriform sinuses;Penetration/Aspiration during swallow;Reduced epiglottic inversion;Reduced anterior laryngeal mobility;Reduced laryngeal elevation;Reduced airway/laryngeal closure Pharyngeal Material enters airway, passes BELOW cords without attempt by patient to eject out (silent aspiration) Pharyngeal- Honey Cup -- Pharyngeal -- Pharyngeal- Nectar Teaspoon -- Pharyngeal -- Pharyngeal- Nectar Cup -- Pharyngeal -- Pharyngeal- Nectar Straw -- Pharyngeal -- Pharyngeal- Thin Teaspoon -- Pharyngeal -- Pharyngeal- Thin Cup -- Pharyngeal -- Pharyngeal- Thin Straw -- Pharyngeal -- Pharyngeal- Puree Pharyngeal residue - valleculae;Reduced epiglottic inversion;Reduced anterior laryngeal mobility Pharyngeal -- Pharyngeal- Mechanical Soft -- Pharyngeal -- Pharyngeal- Regular -- Pharyngeal -- Pharyngeal- Multi-consistency -- Pharyngeal -- Pharyngeal- Pill -- Pharyngeal -- Pharyngeal Comment --  CHL IP CERVICAL ESOPHAGEAL PHASE 08/12/2017 Cervical Esophageal Phase WFL Pudding Teaspoon -- Pudding Cup -- Honey Teaspoon -- Honey Cup -- Nectar Teaspoon -- Nectar Cup -- Nectar Straw -- Thin Teaspoon -- Thin Cup -- Thin Straw -- Puree -- Mechanical Soft -- Regular -- Multi-consistency -- Pill -- Cervical Esophageal Comment -- No flowsheet data found. Houston Siren 08/12/2017, 1:45 PM Orbie Pyo Colvin Caroli.Ed CCC-SLP Pager [phone]              SIGNIFICANT EVENTS  9/26> Right sided VATS s/p RUL Lobectomy.  9/28 >Acute hypoxic respiratory failure with new subcutaneous emphysema>intubated /vent   STUDIES:  PFTs - Pulmonary function tests November 2017 > ratio 74%, FVC 3.19 (100%) FEV1 2.36 (97%) CT Chest 07/09/17> interval progression RUL nodule 41mm to 71mm   CT head 10/2 > NAICP, soft tissue gas noted  Cultures :  Sputum 9/28 >>normal flora  BC 9/28 >> NEG   ABX:  Vanc 9/30>>>10/2 Ceftaz 9/30>>> 10/7  DISCUSSION: 71 y/o female s/p right upper lobectomy for adenocarcinoma with persistent respiratory failure post operatively. There is concern for airway injury.  Stable 10/10 and PCCM will sign off.  ASSESSMENT / PLAN:  PULMONARY A: Acute respiratory failure with hypoxemia HCAP Pneumothorax R upper lobe adenocarcinoma Difficult airway, airway injury suspected P:   Monitor closely for airway protection since continues to fail swallow evaluation Pulmonary hygiene  PT to mobilize Continue decadron today for concern for airway edema, will continue for a total of 3 days (10/10 is the last day) F/u CXR  Chest tubes per CVTS - last one to be removed 10/10  CARDIOVASCULAR A:  Septic shock resolved Afib HTN P:  Continue amiodarone, metoprolol, amlodipine, clonidine D/c clonidine first once BP levels out - was added for BP and to assist with agitation on vent   Tele monitoring  RENAL  Recent Labs Lab 08/11/17 1732 08/12/17 0342 08/13/17 0422  NA 153* 153* 155*    Recent Labs Lab 08/11/17 1732  08/12/17 0342 08/13/17 0422  K 3.4* 3.2* 4.0    Lab Results  Component Value Date   CREATININE 1.06 (H) 08/13/2017   CREATININE 1.20 (H) 08/12/2017   CREATININE 1.22 (H) 08/11/2017    A:   Mild hypernatremia CKD Hypokalemia  P:   Monitor BMET and UOP Replace K  MG and Phos in AM Free water 300 ml q6 via NGT  GASTROINTESTINAL A:   Coffee ground emesis 10/1, resolved P:   NPO for now  Speech to assess (failed 10/8) TF  for now  HEMATOLOGIC  Recent Labs  08/12/17 0342 08/13/17 0422  HGB 9.0* 9.7*    A:   Anemia without bleeding P:  Monitor for bleeding Lovenox   INFECTIOUS A:   HCAP P:   S/p 8 days abx  Monitor wbc, fever curve off abx   ENDOCRINE CBG (last 3)   Recent Labs   08/13/17 0023 08/13/17 0407 08/13/17 0851  GLUCAP 133* 170* 139*     A:   Hyperglycemia  P:   SSI  NEUROLOGIC A:   Anxiety/agitation - improving.  P:   Wean clonazepam slowly - changed to BID from q8h 10/8, will continue for now given past events. Continue risperdal for now   FAMILY  - Updates: Patient and daughter updated bedside. -Improved 10/10, chest tube to be removed. Making slow progress. PCCM will sign off, please call if needed.   Richardson Landry Minor ACNP Maryanna Shape PCCM Pager [phone] till 3 pm If no answer page [phone] 08/13/2017, 9:41 AM  Attending Note:  71 year old female who was intubated post CVTS for agitation, pulmonary edema and inability to protect her airway.  Patient was extubated and done well but continues to fail SLP.  I suspect that this is due to physical deconditioning.  On exam, lungs are clear and patient is able to cough appropriately.  I reviewed CXR myself, no acute disease noted.  Discussed with PCCM-NP.  Respiratory failure:  - Monitor for airway protection  Hypoxemia:  - Titrate O2 for sat of 88-92%  Hypokalemia:  - Replace and recheck  Physical deconditioning:  - PT  - CIR considering inpatient rehab  PTX: resolved  - CT out  Agitation:  - Clonazepam, decrease  - Seroquel  PCCM will sign off, please call back if needed  Patient seen and examined, agree with above note.  I dictated the care and orders written for this patient under my direction.  Rush Farmer, Clarissa

## 2017-08-13 NOTE — Progress Notes (Signed)
Physical Therapy Treatment Patient Details Name: Kathleen Guerrero MRN: [id number] DOB: [birth year] Today's Date: 08/13/2017    History of Present Illness 71 y/o female underwent resection of her RUL on 9/26, developed worsening respiratory failure on 9/28 requiring intubation with extubation 10/8. PMHx: tobacco abuse (one pack per day 50 years), hypertension, dyslipidemia, and a stroke    PT Comments    Pt admitted with above diagnosis. Pt currently with functional limitations due to balance and endurance deficits. Pt was able to stand x 3 with [facility] with max assist to power up and min guard assist once in standing.  Pt progressing well.  REady for Rehab.  Pt will benefit from skilled PT to increase their independence and safety with mobility to allow discharge to the venue listed below.     Follow Up Recommendations  CIR;Supervision/Assistance - 24 hour     Equipment Recommendations  None recommended by PT    Recommendations for Other Services OT consult     Precautions / Restrictions Precautions Precautions: Fall Precaution Comments: chest tube Restrictions Weight Bearing Restrictions: No    Mobility  Bed Mobility Overal bed mobility: Needs Assistance Bed Mobility: Sit to Supine       Sit to supine: Max assist;+2 for physical assistance   General bed mobility comments: in chair on arrival.   Transfers Overall transfer level: Needs assistance Equipment used: Ambulation equipment used Transfers: Sit to/from Stand Sit to Stand: Max assist;+2 physical assistance (with use of pad)         General transfer comment: Max assist with use of pad with pt able to use UEs on bar of Stedy to pull to stand position.  Constant cues needed to maintain standing upright.  Pt could right self with verbal and tactile cues only. Once pt stood x 3, ,moved STedy to bed to allow pt to lie back down and rest.   Ambulation/Gait                 Stairs            Wheelchair  Mobility    Modified Rankin (Stroke Patients Only)       Balance Overall balance assessment: Needs assistance         Standing balance support: Bilateral upper extremity supported;During functional activity Standing balance-Leahy Scale: Poor Standing balance comment: Pt was able to stand x 3  standing 1 minute each attempt.  Pt urinated while standing and cleaned pt.                             Cognition Arousal/Alertness: Awake/alert Behavior During Therapy: WFL for tasks assessed/performed Overall Cognitive Status: Impaired/Different from baseline Area of Impairment: Orientation;Memory;Following commands;Safety/judgement                 Orientation Level: Disoriented to;Time;Place   Memory: Decreased short-term memory Following Commands: Follows one step commands consistently;Follows one step commands with increased time Safety/Judgement: Decreased awareness of deficits     General Comments: Pt very soft spoken with increased time for all commands      Exercises      General Comments        Pertinent Vitals/Pain Pain Assessment: No/denies pain  VSS on 2LO2.    Home Living                      Prior Function  PT Goals (current goals can now be found in the care plan section) Acute Rehab PT Goals Patient Stated Goal: return home Progress towards PT goals: Progressing toward goals    Frequency    Min 3X/week      PT Plan Current plan remains appropriate    Co-evaluation              AM-PAC PT "6 Clicks" Daily Activity  Outcome Measure  Difficulty turning over in bed (including adjusting bedclothes, sheets and blankets)?: Unable Difficulty moving from lying on back to sitting on the side of the bed? : Unable Difficulty sitting down on and standing up from a chair with arms (e.g., wheelchair, bedside commode, etc,.)?: Unable Help needed moving to and from a bed to chair (including a wheelchair)?:  Total Help needed walking in hospital room?: Total Help needed climbing 3-5 steps with a railing? : Total 6 Click Score: 6    End of Session Equipment Utilized During Treatment: Oxygen;Gait belt Activity Tolerance: Patient tolerated treatment well Patient left: in bed;with call bell/phone within reach;with family/visitor present Nurse Communication: Mobility status;Precautions (use of Stedy for transfers) PT Visit Diagnosis: Other abnormalities of gait and mobility (R26.89);Difficulty in walking, not elsewhere classified (R26.2);Muscle weakness (generalized) (M62.81)     Time: 5701-7793 PT Time Calculation (min) (ACUTE ONLY): 36 min  Charges:  $Therapeutic Activity: 23-37 mins                    G Codes:       Johnwesley Lederman,PT Acute Rehabilitation [phone] (pager)    Denice Paradise 08/13/2017, 11:49 AM

## 2017-08-13 NOTE — Consult Note (Signed)
Physical Medicine and Rehabilitation Consult Reason for Consult: Decreased functional mobility Referring Physician: Dr. Roxan Hockey   HPI: Kathleen Guerrero is a 71 y.o.right handed female with past medical history of hypertension, hepatitis, tobacco abuse and CVA status post loop recorder September 2017. Per chart review patient lives alone independently prior to admission and still working. One level home 6 steps to entry. As part of patient's workup from CVA 2017 she had a CT of her neck that showed right upper lobe groundglass opacity. CT the chest showed a 13 x 9 x 8 mm opacity and follow-up recommended. She was seen outpatient by Dr. Melvyn Novas with follow-up CTs November in March showing no significant change. Recently another follow-up scan showed increase in size to 13 x 20 mm.She was admitted 913 for ongoing evaluation and care with right upper lobe wedge resection biopsy adenocarcinoma. Underwent VATS, thoracic right upper lobectomy, lymph node dissection 07/30/2017 per Dr. Roxan Hockey. Hospital course pain management. On 08/01/2017 patient developed acute respiratory distress accompanied by crepitus over her right chest wall she had been intubated. Chest x-ray showed extensive soft tissue emphysema throughout the right chest walks stinging to the lower neck partially obscures underlying structures/pneumothorax. A chest tube was later placed 08/03/2017. Patient later extubated 08/11/2017. Hospital course acute blood loss anemia and monitored. Subcutaneous Lovenox added for DVT prophylaxis. Physical therapy evaluation completed 08/12/2017 with recommendations of physical medicine rehabilitation consult.  Voice is hypophonic difficult to obtain history. Daughter from [place] is at the bedside. Discussed with critical care, chest tube should be coming out today Discussed with ICU nursing, has rectal tube  Review of Systems  Constitutional: Negative for chills and fever.  HENT: Negative for  hearing loss.   Eyes: Negative for blurred vision and double vision.  Respiratory: Positive for cough and shortness of breath.   Cardiovascular: Positive for leg swelling. Negative for chest pain and palpitations.  Gastrointestinal: Positive for constipation.       GERD  Genitourinary: Negative for dysuria, flank pain and hematuria.  Musculoskeletal: Positive for myalgias.  Skin: Negative for rash.  Neurological: Positive for headaches. Negative for seizures.  All other systems reviewed and are negative.  Past Medical History:  Diagnosis Date  . Arthritis   . GERD (gastroesophageal reflux disease)    tums  . Headache   . Hepatitis    cmv 30 yrs ago hepatitis  . History of hiatal hernia   . History of loop recorder    after stroke  . Hypertension   . Stroke ([place]) 07/2016   balance a little off  . Tobacco use    Past Surgical History:  Procedure Laterality Date  . APPENDECTOMY    . DERMOID CYST  EXCISION     x2 27'  . EP IMPLANTABLE DEVICE N/A 07/23/2016   Procedure: Loop Recorder Insertion;  Surgeon: Thompson Grayer, MD;  Location: [facility];  Service: Cardiovascular;  Laterality: N/A;  . Intestinal obstruction  1979  . LOBECTOMY Right 07/30/2017   Procedure: LOBECTOMY;  Surgeon: [name], MD;  Location: [facility];  Service: Thoracic;  Laterality: Right;  . TEE WITHOUT CARDIOVERSION N/A 07/23/2016   Procedure: TRANSESOPHAGEAL ECHOCARDIOGRAM (TEE);  Surgeon: Lelon Perla, MD;  Location: [facility];  Service: Cardiovascular;  Laterality: N/A;  . VIDEO ASSISTED THORACOSCOPY (VATS)/WEDGE RESECTION Right 07/30/2017   Procedure: VIDEO ASSISTED THORACOSCOPY (VATS)/WEDGE RESECTION;  Surgeon: [name], MD;  Location: [facility];  Service: Thoracic;  Laterality: Right;  Family History  Problem Relation Age of Onset  . Kidney disease Maternal Grandmother   . Cancer Maternal Grandfather    Social History:  reports that she quit smoking about 12 months ago.  Her smoking use included Cigarettes. She has a 50.00 pack-year smoking history. She has never used smokeless tobacco. She reports that she drinks alcohol. She reports that she does not use drugs. Allergies: No Known Allergies Medications Prior to Admission  Medication Sig Dispense Refill  . amLODipine (NORVASC) 10 MG tablet TAKE 1 TABLET BY MOUTH EVERY DAY (Patient taking differently: TAKE 10 MG BY MOUTH EVERY DAY) 90 tablet 0  . aspirin 325 MG tablet TAKE 1 TABLET BY MOUTH EVERY DAY 30 tablet 11  . atorvastatin (LIPITOR) 40 MG tablet TAKE 1 TABLET BY MOUTH EVERY DAY AT 6 p.m. 90 tablet 0  . calcium carbonate (TUMS - DOSED IN MG ELEMENTAL CALCIUM) 500 MG chewable tablet Chew 1 tablet by mouth 4 (four) times daily as needed for indigestion or heartburn.    . diphenhydrAMINE (BENADRYL) 25 MG tablet Take 50 mg by mouth at bedtime.     . metoprolol (LOPRESSOR) 50 MG tablet Take 1 tablet (50 mg total) by mouth 2 (two) times daily. 60 tablet 11  . nicotine polacrilex ([employer] NICOTINE) 2 MG lozenge Take 1 mg by mouth as needed for smoking cessation (cuts lozenges in half).     Marland Kitchen acetaminophen (TYLENOL) 325 MG tablet Take 650 mg by mouth daily as needed for mild pain or headache.      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 Assist for trunk control and bil LE elevation for return to bed Transfers Overall transfer level: Needs assistance Transfers: Sit to/from Stand Sit to Stand: Max assist, +2 physical assistance, From elevated surface General transfer comment: max assist with bil knees blocked and bil UE support to provide anterior translation and assist with rise from surface. Pt stood x 2 trials of grossly 30 sec each with maintained flexed posture despite cues and assist for hip extension. Unable to transfer OOB as chest tube to be pulled      ADL:    Cognition: Cognition Overall Cognitive Status: Impaired/Different from baseline Orientation Level: Oriented X4, Disoriented to place, Disoriented to time, Disoriented to situation Cognition Arousal/Alertness: Awake/alert Behavior During Therapy: WFL for tasks assessed/performed Overall Cognitive Status: Impaired/Different from baseline Area of Impairment: Orientation, Memory, Following commands, Safety/judgement Orientation Level: Disoriented to, Time, Place Memory: Decreased short-term memory Following Commands: Follows one step commands consistently, Follows one step commands with increased time Safety/Judgement: Decreased awareness of deficits  General Comments: Pt stating she is in [employer]. Pt very soft spoken with increased time for all commands  Blood pressure (!) 152/76, pulse 81, temperature 98 F (36.7 C), temperature source Oral, resp. rate 16, height 5' 2.75" (1.594 m), weight 83.4 kg (183 lb 13.8 oz), SpO2 100 %. Physical Exam  Vitals reviewed. Constitutional: She is oriented to person,  place, and time.  HENT:  Nasogastric tube in place  Eyes:  Pupils reactive to light  Neck: Normal range of motion. Neck supple. No thyromegaly present.  Cardiovascular: Normal rate and regular rhythm.   Respiratory:  Decreased breath sounds at the bases. Chest tube in place  GI: Soft. Bowel sounds are normal. She exhibits no distension.  Neurological: She is alert and oriented to person, place, and time.  Follow simple commands. Her voice is but a whisper.  Skin: Skin is warm and dry.  Motor strength is 3 minus bilateral deltoid, 4 minus bilateral biceps, triceps, finger flexors, extensors, 2 minus bilateral hip flexors, knee extensors, ankle dorsiflexors and plantar flexors. Sensation difficult to assess secondary to poor attention and level of alertness. She does withdraw to pinch. She is able to identify light touch on the right hand and both lower limbs but not in the left hand. There is evidence of resting tremor, right hand.  Results for orders placed or performed during the hospital encounter of 07/30/17 (from the past 24 hour(s))  Glucose, capillary     Status: Abnormal   Collection Time: 08/12/17 11:39 AM  Result Value Ref Range   Glucose-Capillary 149 (H) 65 - 99 mg/dL   Comment 1 Notify RN   Glucose, capillary     Status: Abnormal   Collection Time: 08/12/17  4:41 PM  Result Value Ref Range   Glucose-Capillary 159 (H) 65 - 99 mg/dL   Comment 1 Notify RN   Glucose, capillary     Status: Abnormal   Collection Time: 08/12/17  8:28 PM  Result Value Ref Range   Glucose-Capillary 140 (H) 65 - 99 mg/dL   Comment 1 Capillary Specimen    Comment 2 Notify RN   Glucose, capillary     Status: Abnormal   Collection Time: 08/13/17 12:23 AM  Result Value Ref Range   Glucose-Capillary 133 (H) 65 - 99 mg/dL   Comment 1 Capillary Specimen    Comment 2 Notify RN   Glucose, capillary     Status: Abnormal   Collection Time: 08/13/17  4:07 AM  Result Value Ref Range    Glucose-Capillary 170 (H) 65 - 99 mg/dL   Comment 1 Capillary Specimen    Comment 2 Notify RN   CBC     Status: Abnormal   Collection Time: 08/13/17  4:22 AM  Result Value Ref Range   WBC 11.1 (H) 4.0 - 10.5 K/uL   RBC 3.44 (L) 3.87 - 5.11 MIL/uL   Hemoglobin 9.7 (L) 12.0 - 15.0 g/dL   HCT 32.2 (L) 36.0 - 46.0 %   MCV 93.6 78.0 - 100.0 fL   MCH 28.2 26.0 - 34.0 pg   MCHC 30.1 30.0 - 36.0 g/dL   RDW 15.7 (H) 11.5 - 15.5 %   Platelets 275 150 - 400 K/uL  Basic metabolic panel     Status: Abnormal   Collection Time: 08/13/17  4:22 AM  Result Value Ref Range   Sodium 155 (H) 135 - 145 mmol/L   Potassium 4.0 3.5 - 5.1 mmol/L   Chloride 117 (H) 101 - 111  mmol/L   CO2 28 22 - 32 mmol/L   Glucose, Bld 166 (H) 65 - 99 mg/dL   BUN 52 (H) 6 - 20 mg/dL   Creatinine, Ser 1.06 (H) 0.44 - 1.00 mg/dL   Calcium 8.3 (L) 8.9 - 10.3 mg/dL   GFR calc non Af Amer 52 (L) >60 mL/min   GFR calc Af Amer >60 >60 mL/min   Anion gap 10 5 - 15  Magnesium     Status: None   Collection Time: 08/13/17  4:22 AM  Result Value Ref Range   Magnesium 2.1 1.7 - 2.4 mg/dL  Phosphorus     Status: None   Collection Time: 08/13/17  4:22 AM  Result Value Ref Range   Phosphorus 4.0 2.5 - 4.6 mg/dL   Dg Chest Port 1 View  Result Date: 08/13/2017 CLINICAL DATA:  Chest tube and status post lobectomy of lung. EXAM: PORTABLE CHEST 1 VIEW COMPARISON:  08/12/2017 FINDINGS: One right chest tube has been removed. There is still one right chest tube remaining. Surgical changes are noted in the right lung. No significant pneumothorax. Stable appearance of the heart and mediastinal tissues. Prominent soft tissue along the right side of the mediastinum is unchanged. Left arm PICC line tip is near the superior cavoatrial junction. Patchy densities at left lung base. Feeding tube extends into the abdomen. Again noted is a cardiac loop recorder. IMPRESSION: Removal of one right chest tube.  Negative for pneumothorax. Small patchy  densities in the left lower lung are nonspecific but could represent atelectasis. Support apparatuses as described. Electronically Signed   By: Markus Daft M.D.   On: 08/13/2017 08:02   Dg Chest Portable 1 View  Result Date: 08/12/2017 CLINICAL DATA:  Extubated.  Respiratory failure. EXAM: PORTABLE CHEST 1 VIEW COMPARISON:  1 day prior FINDINGS: Loop recorder projecting over the left sided chest. Feeding tube extends beyond the inferior aspect of the film. Extubation. Normal heart size. Right hemidiaphragm elevation. Surgical changes about the right perihilar region. 2 right chest tubes are unchanged in position. No residual pneumothorax identified. No pleural fluid. Persistent right sided volume loss and presumed postoperative atelectasis. Clear left lung. IMPRESSION: Right chest tubes in place ; no convincing evidence of residual pneumothorax. Right-sided postoperative volume loss and atelectasis. Extubation. Electronically Signed   By: Abigail Miyamoto M.D.   On: 08/12/2017 07:20   Dg Swallowing Func-speech Pathology  Result Date: 08/12/2017 Objective Swallowing Evaluation: Type of Study: MBS-Modified Barium Swallow Study Patient Details Name: Kathleen Guerrero MRN: [id number] Date of Birth: 01-23-1946 Today's Date: 08/12/2017 Time: SLP Start Time (ACUTE ONLY): 1046-SLP Stop Time (ACUTE ONLY): 1101 SLP Time Calculation (min) (ACUTE ONLY): 15 min Past Medical History: Past Medical History: Diagnosis Date . Arthritis  . GERD (gastroesophageal reflux disease)   tums . Headache  . Hepatitis   cmv 30 yrs ago hepatitis . History of hiatal hernia  . History of loop recorder   after stroke . Hypertension  . Stroke ([place]) 07/2016  balance a little off . Tobacco use  Past Surgical History: Past Surgical History: Procedure Laterality Date . APPENDECTOMY   . DERMOID CYST  EXCISION    x2 3' . EP IMPLANTABLE DEVICE N/A 07/23/2016  Procedure: Loop Recorder Insertion;  Surgeon: Thompson Grayer, MD;  Location: [facility];   Service: Cardiovascular;  Laterality: N/A; . Intestinal obstruction  1979 . LOBECTOMY Right 07/30/2017  Procedure: LOBECTOMY;  Surgeon: [name], MD;  Location: [facility];  Service: Thoracic;  Laterality: Right; . TEE WITHOUT CARDIOVERSION N/A 07/23/2016  Procedure: TRANSESOPHAGEAL ECHOCARDIOGRAM (TEE);  Surgeon: Lelon Perla, MD;  [facility];  Service: Cardiovascular;  Laterality: N/A; . VIDEO ASSISTED THORACOSCOPY (VATS)/WEDGE RESECTION Right 07/30/2017  Procedure: VIDEO ASSISTED THORACOSCOPY (VATS)/WEDGE RESECTION;  Surgeon: [name], MD;  Location: [facility];  Service: Thoracic;  Laterality: Right; HPI: Pt with planned thoracoscopy wedge resection for RUL nodule 07/30/17. Required intubation 9/28 due to acute hypoxic respiratory failure. Pt's ETT was not maintaining cuff pressure and she required emergent anesthesia for tube change. Extubated 10/8 and pt pulled Cortrak tube. CXR Right chest tubes in place ; no convincing evidence of residual pneumothorax. Right-sided postoperative volume loss and atelectasis. PMH: CVA 2017, GERD, HTN. Objective assessment recommended after BSE to fully assess swallow function (SLP rec'd FEES however pt preferred MBS). No Data Recorded Assessment / Plan / Recommendation CHL IP CLINICAL IMPRESSIONS 08/12/2017 Clinical Impression Pt exhibits moderate-severe oropharyngeal dysphagia with sensorimotor based impairments. Diminished oral cohesion, delayed transit and lingual residue describe oral phase. Honey thick barium entered pt's larygneal vestibule prior to hyoid elevation initiated. Reduced hyolaryngeal excursion led to decreased epiglottic inversion also appeared to be impeded by NG tube. Pilar Plate amount of honey thick barium aspirated during the swallow without sensation. Mod-max vallecular residue incompletely cleared with cued second swallow. No po's recommended at present. Continue alternate means with oral care and continued ST intervention for  facilitation toward safe po intake.  SLP Visit Diagnosis Dysphagia, pharyngoesophageal phase (R13.14) Attention and concentration deficit following -- Frontal lobe and executive function deficit following -- Impact on safety and function Severe aspiration risk   CHL IP TREATMENT RECOMMENDATION 08/12/2017 Treatment Recommendations Therapy as outlined in treatment plan below   No flowsheet data found. CHL IP DIET RECOMMENDATION 08/12/2017 SLP Diet Recommendations NPO Liquid Administration via -- Medication Administration Via alternative means Compensations -- Postural Changes --   CHL IP OTHER RECOMMENDATIONS 08/12/2017 Recommended Consults -- Oral Care Recommendations Oral care QID Other Recommendations --   CHL IP FOLLOW UP RECOMMENDATIONS 08/12/2017 Follow up Recommendations (No Data)   CHL IP FREQUENCY AND DURATION 08/12/2017 Speech Therapy Frequency (ACUTE ONLY) min 2x/week Treatment Duration 2 weeks      CHL IP ORAL PHASE 08/12/2017 Oral Phase Impaired Oral - Pudding Teaspoon -- Oral - Pudding Cup -- Oral - Honey Teaspoon Decreased bolus cohesion;Delayed oral transit;Lingual/palatal residue Oral - Honey Cup NT Oral - Nectar Teaspoon -- Oral - Nectar Cup -- Oral - Nectar Straw -- Oral - Thin Teaspoon -- Oral - Thin Cup -- Oral - Thin Straw -- Oral - Puree Decreased bolus cohesion;Lingual/palatal residue;Delayed oral transit Oral - Mech Soft -- Oral - Regular -- Oral - Multi-Consistency -- Oral - Pill -- Oral Phase - Comment --  CHL IP PHARYNGEAL PHASE 08/12/2017 Pharyngeal Phase Impaired Pharyngeal- Pudding Teaspoon -- Pharyngeal -- Pharyngeal- Pudding Cup -- Pharyngeal -- Pharyngeal- Honey Teaspoon Penetration/Aspiration before swallow;Delayed swallow initiation-pyriform sinuses;Penetration/Aspiration during swallow;Reduced epiglottic inversion;Reduced anterior laryngeal mobility;Reduced laryngeal elevation;Reduced airway/laryngeal closure Pharyngeal Material enters airway, passes BELOW cords without attempt by patient  to eject out (silent aspiration) Pharyngeal- Honey Cup -- Pharyngeal -- Pharyngeal- Nectar Teaspoon -- Pharyngeal -- Pharyngeal- Nectar Cup -- Pharyngeal -- Pharyngeal- Nectar Straw -- Pharyngeal -- Pharyngeal- Thin Teaspoon -- Pharyngeal -- Pharyngeal- Thin Cup -- Pharyngeal -- Pharyngeal- Thin Straw -- Pharyngeal -- Pharyngeal- Puree Pharyngeal residue - valleculae;Reduced epiglottic inversion;Reduced anterior laryngeal mobility Pharyngeal -- Pharyngeal- Mechanical Soft -- Pharyngeal -- Pharyngeal- Regular -- Pharyngeal -- Pharyngeal- Multi-consistency --  Pharyngeal -- Pharyngeal- Pill -- Pharyngeal -- Pharyngeal Comment --  CHL IP CERVICAL ESOPHAGEAL PHASE 08/12/2017 Cervical Esophageal Phase WFL Pudding Teaspoon -- Pudding Cup -- Honey Teaspoon -- Honey Cup -- Nectar Teaspoon -- Nectar Cup -- Nectar Straw -- Thin Teaspoon -- Thin Cup -- Thin Straw -- Puree -- Mechanical Soft -- Regular -- Multi-consistency -- Pill -- Cervical Esophageal Comment -- No flowsheet data found. 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 Does the need for close, 24 hr/day medical supervision in concert with the patient's rehab needs make it unreasonable for this patient to be served in a less intensive setting? Potentially 2. Co-Morbidities requiring supervision/potential complications: Incontinence of bowel and bladder, severe dysphagia, nothing by mouth 3. Due to bladder management, bowel management, safety, skin/wound care, disease management, medication administration, pain management and patient education, does the patient require 24 hr/day rehab nursing? Yes 4. Does the patient require coordinated care of a physician, rehab nurse, PT (1-2 hrs/day, 5 days/week), OT (1-2 hrs/day, 5 days/week) and SLP (.5-1 hrs/day, 5 days/week)  to address physical and functional deficits in the context of the above medical diagnosis(es)? Yes Addressing deficits in the following areas: balance, endurance, locomotion, strength, transferring, bowel/bladder control, bathing, dressing, feeding, grooming, toileting, cognition, speech, language, swallowing, psychosocial support and followup needs 5. Can the patient actively participate in an intensive therapy program of at least 3 hrs of therapy per day at least 5 days per week? No 6. The potential for patient to make measurable gains while on inpatient rehab is currently poor but should improve over the next week 7. Anticipated functional outcomes upon discharge from inpatient rehab are min assist  with PT, min assist with OT, min assist with SLP. 8. Estimated rehab length of stay to reach the above functional goals is: 22-26d 9. Anticipated D/C setting: home vs SNF 10. Anticipated post D/C treatments: depends on D/C environment 11. Overall Rehab/Functional Prognosis: fair to good  RECOMMENDATIONS: This patient's condition is appropriate for continued rehabilitative care in the following setting: Anticipate CIR once able to tolerate 3 hours out of bed, tolerate PT, OT, rectal tube out Patient has agreed to participate in recommended program. N/A Note that insurance prior authorization may be required for reimbursement for recommended care.  Comment: Discussed with daughter, anticipated slow rehabilitation process. Discussed that patient will require physical assistance even after inpatient rehabilitation.  Daughter lives out of state and wishes to talk to social work about this. If patient is going to SNF would benefit from CIR to decrease burden of care, once able to participate   ANGIULLI,DANIEL J., PA-C 08/13/2017

## 2017-08-13 NOTE — Telephone Encounter (Signed)
Spoke w/ pt daughter and requested that she send a manual transmission b/c her home monitor has not updated in at least 14 days. Pt daughter informed me that pt is currently in ICU at [facility]. She will send remote transmission once patient is better and back home.

## 2017-08-13 NOTE — Care Management Note (Signed)
Case Management Note Marvetta Gibbons RN, BSN Unit 4E-Case Manager-- [place] coverage [phone]  Patient Details  Name: Kathleen Guerrero MRN: [id number] Date of Birth: 07-11-1946  Subjective/Objective:  Pt admitted s/p VATS with wedge resection on 07/30/17                 Action/Plan: PTA pt lived at home - CM to follow for d/c needs  Expected Discharge Date:                 Expected Discharge Plan:  IP Rehab Facility  In-House Referral:  Clinical Social Work  Discharge planning Services  CM Consult  Post Acute Care Choice:    Choice offered to:     DME Arranged:    DME Agency:     HH Arranged:    [place] Agency:     Status of Service:  In process, will continue to follow  If discussed at Long Length of Stay Meetings, dates discussed:  10/9  Discharge Disposition:   Additional Comments:  08/13/17- 1600- Marvetta Gibbons RN, CM- pt with prolonged post op coarse requiring re-intubation- extubated now with some concern for airway injury.-Pt remains NPO for now- speech following- still has one CT.  Per PT/OT evals- recommendations for CIR- CIR has been consulted- spoke with pt's daughter Curt Bears via TC regarding d/c plans- CIR is potential plan if pt able to tolerate therapies- however pt may still need STSNF post CIR as daughter lives out of town- pt lives alone. - CM will follow pt progression for possible CIR- daughter also interested in speaking with CSW for SNF backup plan- will have CSW follow up with daughter for this.   Dawayne Patricia, RN 08/13/2017, 4:16 PM

## 2017-08-13 NOTE — Progress Notes (Signed)
Patient ID: Kathleen Guerrero, female   DOB: [birth year], 71 y.o.   MRN: [id number] EVENING ROUNDS NOTE :     [address]       [place] [address]             [phone]                 14 Days Post-Op Procedure(s) (LRB): VIDEO ASSISTED THORACOSCOPY (VATS)/WEDGE RESECTION (Right) LOBECTOMY (Right)  Total Length of Stay:  LOS: 14 days  BP (!) 152/63   Pulse 96   Temp 98.5 F (36.9 C) (Oral)   Resp 18   Ht 5' 2.75" (1.594 m)   Wt 183 lb 13.8 oz (83.4 kg)   SpO2 97%   BMI 32.83 kg/m   .Intake/Output      10/09 0701 - 10/10 0700 10/10 0701 - 10/11 0700   I.V. (mL/kg)  20 (0.2)   Other 130    NG/GT 1650 1150   IV Piggyback 200    Total Intake(mL/kg) 1980 (23.7) 1170 (14)   Urine (mL/kg/hr) 875 (0.4) 950 (1)   Chest Tube 190 100   Total Output 1065 1050   Net +915 +120        Urine Occurrence 3 x      . feeding supplement (VITAL HIGH PROTEIN) 1,000 mL (08/12/17 1602)  . potassium chloride 10 mEq (08/12/17 0752)     Lab Results  Component Value Date   WBC 11.1 (H) 08/13/2017   HGB 9.7 (L) 08/13/2017   HCT 32.2 (L) 08/13/2017   PLT 275 08/13/2017   GLUCOSE 166 (H) 08/13/2017   CHOL 93 01/17/2017   TRIG 113 01/17/2017   HDL 40 (L) 01/17/2017   LDLCALC 30 01/17/2017   ALT 34 08/09/2017   AST 28 08/09/2017   NA 155 (H) 08/13/2017   K 4.0 08/13/2017   CL 117 (H) 08/13/2017   CREATININE 1.06 (H) 08/13/2017   BUN 52 (H) 08/13/2017   CO2 28 08/13/2017   INR 0.99 07/28/2017   HGBA1C 5.4 01/17/2017   Now extubated, tube feeding going  No changes   Grace Isaac MD  Beeper [phone] Office [phone] 08/13/2017 6:57 PM

## 2017-08-14 ENCOUNTER — Inpatient Hospital Stay (HOSPITAL_COMMUNITY): Payer: Medicare HMO

## 2017-08-14 LAB — GLUCOSE, CAPILLARY
GLUCOSE-CAPILLARY: 104 mg/dL — AB (ref 65–99)
GLUCOSE-CAPILLARY: 123 mg/dL — AB (ref 65–99)
Glucose-Capillary: 130 mg/dL — ABNORMAL HIGH (ref 65–99)
Glucose-Capillary: 120 mg/dL — ABNORMAL HIGH (ref 65–99)
Glucose-Capillary: 140 mg/dL — ABNORMAL HIGH (ref 65–99)
Glucose-Capillary: 102 mg/dL — ABNORMAL HIGH (ref 65–99)

## 2017-08-14 LAB — BASIC METABOLIC PANEL
Anion gap: 8 (ref 5–15)
BUN: 45 mg/dL — AB (ref 6–20)
CALCIUM: 8.3 mg/dL — AB (ref 8.9–10.3)
CO2: 26 mmol/L (ref 22–32)
CREATININE: 0.97 mg/dL (ref 0.44–1.00)
Chloride: 117 mmol/L — ABNORMAL HIGH (ref 101–111)
GFR calc Af Amer: >60 mL/min (ref >60)
GFR, EST NON AFRICAN AMERICAN: 58 mL/min — AB (ref >60)
Glucose, Bld: 119 mg/dL — ABNORMAL HIGH (ref 65–99)
POTASSIUM: 3.1 mmol/L — AB (ref 3.5–5.1)
SODIUM: 151 mmol/L — AB (ref 135–145)

## 2017-08-14 LAB — CBC
HCT: 33.7 % — ABNORMAL LOW (ref 36.0–46.0)
Hemoglobin: 10.1 g/dL — ABNORMAL LOW (ref 12.0–15.0)
MCH: 27.7 pg (ref 26.0–34.0)
MCHC: 30 g/dL (ref 30.0–36.0)
MCV: 92.6 fL (ref 78.0–100.0)
PLATELETS: 283 10*3/uL (ref 150–400)
RBC: 3.64 MIL/uL — AB (ref 3.87–5.11)
RDW: 15.2 % (ref 11.5–15.5)
WBC: 11.3 10*3/uL — ABNORMAL HIGH (ref 4.0–10.5)

## 2017-08-14 MED ORDER — ORAL CARE MOUTH RINSE
15.0000 mL | Freq: Two times a day (BID) | OROMUCOSAL | Status: DC
  Administered 2017-08-14 – 2017-08-20 (×12): 15 mL via OROMUCOSAL

## 2017-08-14 MED ORDER — POTASSIUM CHLORIDE 20 MEQ/15ML (10%) PO SOLN
40.0000 meq | Freq: Two times a day (BID) | ORAL | Status: AC
  Administered 2017-08-14 (×2): 40 meq via ORAL
  Filled 2017-08-14 (×2): qty 30

## 2017-08-14 MED ORDER — CHLORHEXIDINE GLUCONATE 0.12 % MT SOLN
15.0000 mL | Freq: Two times a day (BID) | OROMUCOSAL | Status: DC
  Administered 2017-08-14 – 2017-08-19 (×12): 15 mL via OROMUCOSAL
  Filled 2017-08-14 (×10): qty 15

## 2017-08-14 MED ORDER — OSMOLITE 1.2 CAL PO LIQD
1000.0000 mL | ORAL | Status: DC
  Administered 2017-08-14 – 2017-08-17 (×4): 1000 mL
  Filled 2017-08-14 (×9): qty 1000

## 2017-08-14 MED ORDER — INSULIN ASPART 100 UNIT/ML ~~LOC~~ SOLN
2.0000 [IU] | Freq: Four times a day (QID) | SUBCUTANEOUS | Status: DC
  Administered 2017-08-14 – 2017-08-18 (×10): 2 [IU] via SUBCUTANEOUS

## 2017-08-14 MED ORDER — CLONAZEPAM 0.5 MG PO TABS
0.5000 mg | ORAL_TABLET | Freq: Two times a day (BID) | ORAL | Status: DC | PRN
  Administered 2017-08-18 – 2017-08-19 (×2): 0.5 mg via ORAL
  Filled 2017-08-14 (×2): qty 1

## 2017-08-14 MED ORDER — PRO-STAT SUGAR FREE PO LIQD
30.0000 mL | Freq: Three times a day (TID) | ORAL | Status: DC
  Administered 2017-08-14 – 2017-08-19 (×15): 30 mL
  Filled 2017-08-14 (×15): qty 30

## 2017-08-14 MED ORDER — RISPERIDONE 1 MG/ML PO SOLN: 0.5000 mg | Freq: Two times a day (BID) | ORAL | Status: DC | PRN

## 2017-08-14 NOTE — Clinical Social Work Note (Signed)
Clinical Social Work Assessment  Patient Details  Name: Kathleen Guerrero MRN: [id number] Date of Birth: 30-Jun-1946  Date of referral:  08/14/17               Reason for consult:  Facility Placement                Permission sought to share information with:  Chartered certified accountant granted to share information::  Yes, Verbal Permission Granted  Name::     Arboriculturist::  SNF  Relationship::  dtr  Contact Information:     Housing/Transportation Living arrangements for the past 2 months:  Single Family Home Source of Information:  Adult Children Patient Interpreter Needed:  None Criminal Activity/Legal Involvement Pertinent to Current Situation/Hospitalization:  No - Comment as needed Significant Relationships:  Adult Children Lives with:  Self Do you feel safe going back to the place where you live?  No Need for family participation in patient care:  Yes (Comment) (help with decision making at this time)  Care giving concerns:  Pt lives at home alone- dtr [place] of [place] so would not be able to provide needed assistance.  Social Worker assessment / plan: SW spoke with pt dtr concerning CIR vs SNF.  CSW explained SNF as alternative option for rehab if CIR is not possible when pt is cleared to leave the hospital.  CSW explained possible barriers to CIR [employer], availability etc)  Employment status:  Retired Nurse, adult PT Recommendations:  [place] / Referral to community resources:  [place]  Patient/Family's Response to care:  Pt dtr is agreeable to SNF as back up to CIR but wants CIR as first priority.  Dtr a little confused why insurance could be a barrier if CIR states pt is appropriate.  Patient/Family's Understanding of and Emotional Response to Diagnosis, Current Treatment, and Prognosis:  Dtr has good understanding of pt condition at this time and is hopeful that she will  improve quickly with intensive rehab.  Emotional Assessment Appearance:  Appears stated age Attitude/Demeanor/Rapport:  Unable to Assess Affect (typically observed):  Unable to Assess Orientation:  Oriented to Self, Oriented to  Time Alcohol / Substance use:  Not Applicable Psych involvement (Current and /or in the community):  No (Comment)  Discharge Needs  Concerns to be addressed:  Care Coordination Readmission within the last 30 days:  No Current discharge risk:  Physical Impairment Barriers to Discharge:  Continued Medical Work up   Jorge Ny, LCSW 08/14/2017, 1:53 PM

## 2017-08-14 NOTE — Progress Notes (Signed)
Physical Therapy Treatment Patient Details Name: Kathleen Guerrero MRN: [id number] DOB: May 22, 1946 Today's Date: 08/14/2017    History of Present Illness 71 y/o female underwent resection of her RUL on 9/26, developed worsening respiratory failure on 9/28 requiring intubation with extubation 10/8. PMHx: tobacco abuse (one pack per day 50 years), hypertension, dyslipidemia, and a stroke    PT Comments    Pt admitted with above diagnosis. Pt currently with functional limitations due to balance and endurance deficits. Pt was only able to perform stand pivot transfers as she was not able to stand upright even with cues and would flex and just not take steps.  Unsure if pt fatigued from standing to be cleaned.  Transfers only due to pt inability to sustain upright for ambulation even with [place] walker.  Will continue to progress as able. Pt will benefit from skilled PT to increase their independence and safety with mobility to allow discharge to the venue listed below.     Follow Up Recommendations  CIR;Supervision/Assistance - 24 hour     Equipment Recommendations  None recommended by PT    Recommendations for Other Services OT consult     Precautions / Restrictions Precautions Precautions: Fall Precaution Comments: chest tube Restrictions Weight Bearing Restrictions: No    Mobility  Bed Mobility Overal bed mobility: Needs Assistance Bed Mobility: Sit to Supine       Sit to supine: Max assist;+2 for physical assistance   General bed mobility comments: assist to lower trunk and to lift LEs onto bed   Transfers Overall transfer level: Needs assistance Equipment used: Rolling walker (2 wheeled) (EVA walker ) Transfers: Sit to/from Marketing executive Transfers Sit to Stand: Max assist;+2 physical assistance Stand pivot transfers: Max assist;+2 physical assistance       General transfer comment: Pt requires assist to power up into standing, to maintain  standing balance, and to pivot to chair and to the bed.  Pt incontinent of urine and unable to maintain standing to to fully pivot so was assisted into sitting.  She stood x 3 more times with max A +2 and then transferred from [facility] to bed with assist for balance and to turn/pivot   Ambulation/Gait                 Stairs            Wheelchair Mobility    Modified Rankin (Stroke Patients Only)       Balance Overall balance assessment: Needs assistance Sitting-balance support: Feet supported;Bilateral upper extremity supported Sitting balance-Leahy Scale: Poor Sitting balance - Comments: close min guard assist.  Leans to Lt, and dependent on UE support  Postural control: Left lateral lean Standing balance support: Bilateral upper extremity supported Standing balance-Leahy Scale: Poor Standing balance comment: Pt stood for 1-4 mins with mod A - max A to maintain standing balance                             Cognition Arousal/Alertness: Awake/alert;Lethargic Behavior During Therapy: Flat affect Overall Cognitive Status: Impaired/Different from baseline Area of Impairment: Orientation;Attention;Memory;Following commands;Safety/judgement;Problem solving                 Orientation Level: Disoriented to;Time;Place Current Attention Level: Sustained;Focused Memory: Decreased short-term memory Following Commands: Follows one step commands consistently;Follows one step commands with increased time Safety/Judgement: Decreased awareness of safety;Decreased awareness of deficits   Problem Solving: Slow processing;Decreased initiation;Difficulty sequencing;Requires verbal cues;Requires  tactile cues        Exercises General Exercises - Lower Extremity Long Arc Quad: AROM;Both;10 reps;Seated    General Comments General comments (skin integrity, edema, etc.): VSS, [place] in place but O2  turned off.  Sats >92%.        Pertinent Vitals/Pain Pain Assessment:  Faces Faces Pain Scale: Hurts little more Pain Location: generalized  Pain Descriptors / Indicators: Grimacing Pain Intervention(s): Limited activity within patient's tolerance;Monitored during session;Repositioned    Home Living Family/patient expects to be discharged to:: Private residence Living Arrangements: Alone Available Help at Discharge: Family;Available 24 hours/day Type of Home: House Home Access: Stairs to enter Entrance Stairs-Rails: Right;Left;Can reach both Home Layout: One level Home Equipment: Grab bars - toilet;Grab bars - tub/shower;Walker - 2 wheels;Cane - single point;Shower seat      Prior Function Level of Independence: Independent      Comments: 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[employer]   PT Goals (current goals can now be found in the care plan section) Acute Rehab PT Goals Patient Stated Goal: return home Progress towards PT goals: Progressing toward goals    Frequency    Min 3X/week      PT Plan Current plan remains appropriate    Co-evaluation PT/OT/SLP Co-Evaluation/Treatment: Yes Reason for Co-Treatment: Complexity of the patient's impairments (multi-system involvement) PT goals addressed during session: Mobility/safety with mobility OT goals addressed during session: ADL's and self-care      AM-PAC PT "6 Clicks" Daily Activity  Outcome Measure  Difficulty turning over in bed (including adjusting bedclothes, sheets and blankets)?: Unable Difficulty moving from lying on back to sitting on the side of the bed? : Unable Difficulty sitting down on and standing up from a chair with arms (e.g., wheelchair, bedside commode, etc,.)?: Unable Help needed moving to and from a bed to chair (including a wheelchair)?: Total Help needed walking in hospital room?: Total Help needed climbing 3-5 steps with a railing? : Total 6 Click Score: 6    End of Session Equipment Utilized During Treatment: Gait belt Activity Tolerance: Patient  limited by fatigue Patient left: in bed;with call bell/phone within reach Nurse Communication: Mobility status PT Visit Diagnosis: Other abnormalities of gait and mobility (R26.89);Difficulty in walking, not elsewhere classified (R26.2);Muscle weakness (generalized) (M62.81)     Time: 0814-4818 PT Time Calculation (min) (ACUTE ONLY): 37 min  Charges:  $Therapeutic Activity: 8-22 mins                    G Codes:       Evert Wenrich,PT Acute Rehabilitation [phone] (pager)    Denice Paradise 08/14/2017, 12:29 PM

## 2017-08-14 NOTE — Progress Notes (Signed)
  Speech Language Pathology Treatment: Dysphagia  Patient Details Name: Kathleen Guerrero MRN: [id number] DOB: [birth year] Today's Date: 08/14/2017 Time: 1203-1226 SLP Time Calculation (min) (ACUTE ONLY): 23 min  Assessment / Plan / Recommendation Clinical Impression  Session targeted expiratory muscle strength training to improved cough and pharyngeal contraction. Examination of oral cavity revealed dried mucous on hard/soft palate and glossopalatine arches removed 70% by SLP for infection prevention and control during which she needs lots of encouragement to participate/allow. The EMST 150 provided too much resistance yesterday therefore introduced the [facility] respironic muscle trainer initially set at 5 cm H2O and raised to 10 which she tolerated. Moderate verbal/visual cues given for adequate labial closure and coordination of inhalation and exhalation. SLP will need to educate pt's daughter to assist with carry over (RN educated re: target hard palate during oral care and use of trainer). Recommend 2 sets of 10 three times a day as able. Endurance continues to be low.       HPI HPI: Pt with planned thoracoscopy wedge resection for RUL nodule 07/30/17. Required intubation 9/28 due to acute hypoxic respiratory failure. Pt's ETT was not maintaining cuff pressure and she required emergent anesthesia for tube change. Extubated 10/8 and pt pulled Cortrak tube. CXR Right chest tubes in place ; no convincing evidence of residual pneumothorax. Right-sided postoperative volume loss and atelectasis. PMH: CVA 2017, GERD, HTN. Objective assessment recommended after BSE to fully assess swallow function (SLP rec'd FEES however pt preferred MBS).      SLP Plan  Continue with current plan of care       Recommendations  Diet recommendations: NPO Medication Administration: Via alternative means                Oral Care Recommendations: Oral care QID Follow up Recommendations: Inpatient Rehab SLP Visit  Diagnosis: Dysphagia, pharyngoesophageal phase (R13.14) Plan: Continue with current plan of care       GO                Houston Siren 08/14/2017, 1:07 PM  Orbie Pyo Colvin Caroli.Ed [employer] [phone]

## 2017-08-14 NOTE — Progress Notes (Signed)
15 Days Post-Op Procedure(s) (LRB): VIDEO ASSISTED THORACOSCOPY (VATS)/WEDGE RESECTION (Right) LOBECTOMY (Right) Subjective: Lethargic this AM Hard to arouse but once awake denies pain  Objective: Vital signs in last 24 hours: Temp:  [97.6 F (36.4 C)-98.5 F (36.9 C)] 98.3 F (36.8 C) (10/11 0752) Pulse Rate:  [77-99] 83 (10/11 0700) Cardiac Rhythm: Normal sinus rhythm (10/11 0400) Resp:  [15-35] 19 (10/11 0700) BP: (106-165)/(59-98) 134/63 (10/11 0700) SpO2:  [92 %-100 %] 96 % (10/11 0700) Weight:  [177 lb 14.6 oz (80.7 kg)] 177 lb 14.6 oz (80.7 kg) (10/11 0300)  Hemodynamic parameters for last 24 hours:    Intake/Output from previous day: 10/10 0701 - 10/11 0700 In: 2570 [I.V.:20; NG/GT:2500; IV Piggyback:50] Out: 2300 [Urine:2100; Stool:100; Chest Tube:100] Intake/Output this shift: No intake/output data recorded.  General appearance: letahrgic Neurologic: generalized weakness Heart: regular rate and rhythm Lungs: clear to auscultation bilaterally Abdomen: normal findings: soft, non-tender no air leak  Lab Results:  Recent Labs  08/13/17 0422 08/14/17 0312  WBC 11.1* 11.3*  HGB 9.7* 10.1*  HCT 32.2* 33.7*  PLT 275 283   BMET:  Recent Labs  08/13/17 0422 08/14/17 0312  NA 155* 151*  K 4.0 3.1*  CL 117* 117*  CO2 28 26  GLUCOSE 166* 119*  BUN 52* 45*  CREATININE 1.06* 0.97  CALCIUM 8.3* 8.3*    PT/INR: No results for input(s): LABPROT, INR in the last 72 hours. ABG    Component Value Date/Time   PHART 7.557 (H) 08/07/2017 0350   HCO3 30.0 (H) 08/07/2017 0350   TCO2 27 08/07/2017 1700   ACIDBASEDEF 1.4 08/05/2017 0406   O2SAT 99.0 08/07/2017 0350   CBG (last 3)   Recent Labs  08/14/17 0024 08/14/17 0321 08/14/17 0750  GLUCAP 130* 104* 120*    Assessment/Plan: S/P Procedure(s) (LRB): VIDEO ASSISTED THORACOSCOPY (VATS)/WEDGE RESECTION (Right) LOBECTOMY (Right) -NEURO- delirium resolved. She is over sedated currently, will change  risperidone and Klonopin to PRN  CV- in SR  RESP- no air leak- dc chest tube  RENAL- hypernatremia a little better  Hypokalemia worse again on TID K phos  ENDO_ CBG well controlled- change to Q6  Deconditioning- mobilize as tolerated   LOS: 15 days    [name] 08/14/2017

## 2017-08-14 NOTE — Progress Notes (Signed)
TCTS BRIEF SICU PROGRESS NOTE  15 Days Post-Op  S/P Procedure(s) (LRB): VIDEO ASSISTED THORACOSCOPY (VATS)/WEDGE RESECTION (Right) LOBECTOMY (Right)   Stable day although still lethargic Breathing comfortably w/ O2 sats 98% on 2 L/min NSR w/ stable BP Diuresing well  Plan: Continue current plan  Rexene Alberts, MD 08/14/2017 6:11 PM

## 2017-08-14 NOTE — Evaluation (Signed)
Occupational Therapy Evaluation Patient Details Name: Kathleen Guerrero MRN: [id number] DOB: 1946-08-17 Today's Date: 08/14/2017    History of Present Illness 70 y/o female underwent resection of her RUL on 9/26, developed worsening respiratory failure on 9/28 requiring intubation with extubation 10/8. PMHx: tobacco abuse (one pack per day 50 years), hypertension, dyslipidemia, and a stroke   Clinical Impression   Pt admitted with above. She demonstrates the below listed deficits and will benefit from continued OT to maximize safety and independence with BADLs.  Pt fatigued this am, as she had bees sitting up in recliner ~5 hours prior to OT/PT seeing pt.  She was fully independent PTA.  Currently, she requires max - total A for ADLs and max A +2 for functional transfers.  Feel she would benefit from CIR level therapies to allow her to maximize safety and independence with ADLs, reduce risk of falls/injury, reduce risk of readmission, and to reduce burden of care.  Will follow acutely.       Follow Up Recommendations  CIR;Supervision/Assistance - 24 hour    Equipment Recommendations  None recommended by OT    Recommendations for Other Services Rehab consult     Precautions / Restrictions Precautions Precautions: Fall Precaution Comments: chest tube      Mobility Bed Mobility Overal bed mobility: Needs Assistance Bed Mobility: Sit to Supine       Sit to supine: Max assist;+2 for physical assistance   General bed mobility comments: assist to lower trunk and to lift LEs onto bed   Transfers Overall transfer level: Needs assistance Equipment used: Rolling walker (2 wheeled) (EVA walker ) Transfers: Sit to/from Marketing executive Transfers Sit to Stand: Max assist;+2 physical assistance Stand pivot transfers: Max assist;+2 physical assistance       General transfer comment: Pt requires assist to power up into standing, to maintain standing balance, and  to pivot to chair and to the bed.  Pt incontinent of urine and unable to maintain standing to to fully pivot so was assisted into sitting.  She stood x 3 more times with max A +2 and then transferred from [facility] to bed with assist for balance and to turn/pivot     Balance Overall balance assessment: Needs assistance Sitting-balance support: Feet supported;Bilateral upper extremity supported Sitting balance-Leahy Scale: Poor Sitting balance - Comments: close min guard assist.  Leans to Lt, and dependent on UE support  Postural control: Left lateral lean Standing balance support: Bilateral upper extremity supported Standing balance-Leahy Scale: Poor Standing balance comment: Pt stood for 1-4 mins with mod A - max A to maintain standing balance                            ADL either performed or assessed with clinical judgement   ADL Overall ADL's : Needs assistance/impaired Eating/Feeding: NPO   Grooming: Wash/dry hands;Wash/dry face;Oral care;Brushing hair;Maximal assistance;Sitting   Upper Body Bathing: Maximal assistance;Sitting   Lower Body Bathing: Total assistance;Sit to/from stand   Upper Body Dressing : Total assistance;Sitting   Lower Body Dressing: Total assistance;Sit to/from stand   Toilet Transfer: Maximal assistance;+2 for physical assistance;Stand-pivot;Squat-pivot;BSC   Toileting- Clothing Manipulation and Hygiene: Total assistance;Sit to/from stand       Functional mobility during ADLs: Maximal assistance;+2 for physical assistance       Vision   Additional Comments: unsure of baseline      Perception     Praxis  Pertinent Vitals/Pain Pain Assessment: Faces Faces Pain Scale: Hurts little more Pain Location: generalized  Pain Descriptors / Indicators: Grimacing Pain Intervention(s): Monitored during session;Repositioned     Hand Dominance Right   Extremity/Trunk Assessment Upper Extremity Assessment Upper Extremity Assessment:  Generalized weakness   Lower Extremity Assessment Lower Extremity Assessment: Overall WFL for tasks assessed   Cervical / Trunk Assessment Cervical / Trunk Assessment: Kyphotic   Communication Communication Communication: No difficulties   Cognition Arousal/Alertness: Awake/alert;Lethargic Behavior During Therapy: Flat affect Overall Cognitive Status: Impaired/Different from baseline Area of Impairment: Orientation;Attention;Memory;Following commands;Safety/judgement;Problem solving                 Orientation Level: Disoriented to;Time;Place Current Attention Level: Sustained;Focused Memory: Decreased short-term memory Following Commands: Follows one step commands consistently;Follows one step commands with increased time Safety/Judgement: Decreased awareness of safety;Decreased awareness of deficits   Problem Solving: Slow processing;Decreased initiation;Difficulty sequencing;Requires verbal cues;Requires tactile cues     General Comments  VSS.  [place] in place, but 02 turned off.  Sats >92%     Exercises     Shoulder Instructions      Home Living Family/patient expects to be discharged to:: Private residence Living Arrangements: Alone Available Help at Discharge: Family;Available 24 hours/day Type of Home: House Home Access: Stairs to enter [employer] of Steps: 6 Entrance Stairs-Rails: Right;Left;Can reach both Home Layout: One level     Bathroom Shower/Tub: Occupational psychologist: Standard Bathroom Accessibility: No   Home Equipment: Grab bars - toilet;Grab bars - tub/shower;Walker - 2 wheels;Cane - single point;Shower seat          Prior Functioning/Environment Level of Independence: Independent        Comments: 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[employer] Problem List: Decreased strength;Decreased activity tolerance;Impaired balance (sitting and/or standing);Decreased cognition;Decreased safety  awareness;Decreased knowledge of use of DME or AE      OT Treatment/Interventions: Self-care/ADL training;Therapeutic exercise;DME and/or AE instruction;Energy conservation;Therapeutic activities;Cognitive remediation/compensation;Patient/family education;Balance training    OT Goals(Current goals can be found in the care plan section) Acute Rehab OT Goals Patient Stated Goal: return home OT Goal Formulation: With patient Time For Goal Achievement: 08/28/17 Potential to Achieve Goals: Good  OT Frequency: Min 2X/week   Barriers to D/C:            Co-evaluation PT/OT/SLP Co-Evaluation/Treatment: Yes Reason for Co-Treatment: Complexity of the patient's impairments (multi-system involvement);Necessary to address cognition/behavior during functional activity;For patient/therapist safety;To address functional/ADL transfers   OT goals addressed during session: ADL's and self-care      AM-PAC PT "6 Clicks" Daily Activity     Outcome Measure Help from another person eating meals?: Total Help from another person taking care of personal grooming?: A Lot Help from another person toileting, which includes using toliet, bedpan, or urinal?: A Lot Help from another person bathing (including washing, rinsing, drying)?: Total Help from another person to put on and taking off regular upper body clothing?: Total Help from another person to put on and taking off regular lower body clothing?: Total 6 Click Score: 8   End of Session Equipment Utilized During Treatment: Gait belt;Rolling walker Nurse Communication: Mobility status  Activity Tolerance: Patient limited by fatigue;Patient limited by lethargy Patient left: in bed;with call bell/phone within reach;with bed alarm set  OT Visit Diagnosis: Unsteadiness on feet (R26.81)                Time: 0272-5366 OT Time  Calculation (min): 37 min Charges:  OT General Charges $OT Visit: 1 Visit OT Treatments $Therapeutic Activity: 8-22  mins G-Codes:     [facility], OTR/L [phone]    Lucille Passy M 08/14/2017, 12:19 PM

## 2017-08-14 NOTE — Progress Notes (Signed)
Nutrition Follow-up  DOCUMENTATION CODES:   Obesity unspecified  INTERVENTION:   D/C Vital High Protein  Osmolite 1.2 @ 45 ml/hr (1080 ml/day) 30 ml Prostat TID Provides: 1596 kcal, 105 grams protein, and 875 ml free water.  Total free water: 2075 ml  NUTRITION DIAGNOSIS:   Inadequate oral intake related to inability to eat as evidenced by NPO status. Ongoing.   GOAL:   Patient will meet greater than or equal to 90% of their needs Progressing.   MONITOR:   TF tolerance  ASSESSMENT:   71 yr old female with PMHx GERD, hiatal hernia, 50 pack yr smoker, HTN, HLD, CVA presented for RUL GGO on CT of the chest 13x9x1mm in size which showed increase in size to 13x20 mm. POD 2 s/p Right sided VATS with RUL lobectomy. Developed acute respiratory distress at 10:45 PM on 9/28 accompanied with new crepitus over right chest wall. S/p intubated and now on PRVC.   10/8 pt extubated 10/11 pt failed swallow eval Daughter at bedside and reports that pt has multiple stools per day and sometimes these are liquid. She has never been diagnosed with any GI issue but this is her baseline.   Medications reviewed and include: dulcolax (held), KPhos 500 mg TID before meals and at bedtime, 40 mEq KCl BID, senokot-s (held) Labs reviewed: Na 151 (H), K+ 3.1 (L)  Cortrak feeding tube in place TF: Vital High Protein @ 50 mL/hr. (1200 ml/day) Provides1200 kcal, 105 grams of protein, and 1003 mL free water Free water: 300 ml every 6 hours  Weight decreasing with pt being - 6.2L  Diet Order:  Diet NPO time specified  Skin:   (chest incision, head wound)  Last BM:  100 ml x 24 hrs, minimal amount in rectal tube bag currently  Height:   Ht Readings from Last 1 Encounters:  07/31/17 5' 2.75" (1.594 m)    Weight:   Wt Readings from Last 1 Encounters:  08/14/17 177 lb 14.6 oz (80.7 kg)    Ideal Body Weight:  51.7 kg  BMI:  Body mass index is 31.77 kg/m.  Estimated Nutritional Needs:    Kcal:  1500-1700  Protein:  95-110 grams  Fluid:  >/= 1.5 L/day  EDUCATION NEEDS:   No education needs identified at this time  [place], [place], [place] Pager [phone] After Hours Pager

## 2017-08-14 NOTE — NC FL2 (Signed)
[place] LEVEL OF CARE SCREENING TOOL     IDENTIFICATION  Patient Name: Kathleen Guerrero Birthdate: [birth year] Sex: female Admission Date (Current Location): 07/30/2017  [facility] [place] Number:  Herbalist and Address:  [facility], [place] [address] [place], [place] [address]      Provider Number: [id number]  Attending Physician Name and Address:  [name], MD  Relative Name and Phone Number:       Current Level of Care: Hospital Recommended Level of Care: [place] Prior Approval Number:    Date Approved/Denied:   PASRR Number: [id number]  Discharge Plan: SNF    Current Diagnoses: Patient Active Problem List   Diagnosis Date Noted  . S/P lobectomy of lung 07/30/2017  . Lower extremity edema 04/30/2017  . TIA (transient ischemic attack) 01/16/2017  . Dyslipidemia, goal LDL below 70 01/03/2017  . Diarrhea 09/12/2016  . Sleep disturbance 07/26/2016  . Overactive bladder 07/26/2016  . Tobacco abuse 07/21/2016  . Solitary pulmonary nodule 07/21/2016  . Acute CVA (cerebrovascular accident) ([place]) 07/21/2016  . Acute lacunar stroke ([place]) 07/20/2016  . Hypokalemia 07/20/2016  . Essential hypertension 07/20/2016    Orientation RESPIRATION BLADDER Height & Weight     Self, Time  Normal Incontinent, External catheter Weight: 177 lb 14.6 oz (80.7 kg) Height:  5' 2.75" (159.4 cm)  BEHAVIORAL SYMPTOMS/MOOD NEUROLOGICAL BOWEL NUTRITION STATUS      Incontinent Diet (see DC summary)  AMBULATORY STATUS COMMUNICATION OF NEEDS Skin   Extensive Assist Verbally Surgical wounds                       Personal Care Assistance Level of Assistance  Bathing, Dressing Bathing Assistance: Maximum assistance   Dressing Assistance: Maximum assistance     Functional Limitations Info             SPECIAL CARE FACTORS FREQUENCY  PT (By licensed PT), OT (By licensed OT)     PT Frequency: 5/wk OT  Frequency: 5/wk            Contractures      Additional Factors Info  Code Status, Allergies, Insulin Sliding Scale Code Status Info: FULL Allergies Info: NKA   Insulin Sliding Scale Info: 4/wk       Current Medications (08/14/2017):  This is the current hospital active medication list Current Facility-Administered Medications  Medication Dose Route Frequency Provider Last Rate Last Dose  . amiodarone (PACERONE) tablet 200 mg  200 mg Per NG tube BID [name], MD   200 mg at 08/14/17 0917  . amLODipine (NORVASC) tablet 10 mg  10 mg Oral Daily [name], MD   10 mg at 08/14/17 6759  . atorvastatin (LIPITOR) tablet 40 mg  40 mg Oral q1800 Barrett, Erin R, PA-C   40 mg at 08/13/17 1739  . bisacodyl (DULCOLAX) EC tablet 10 mg  10 mg Oral Daily Barrett, Erin R, PA-C   10 mg at 08/13/17 0925  . calcium carbonate (TUMS - dosed in mg elemental calcium) chewable tablet 200 mg of elemental calcium  1 tablet Oral QID PRN Barrett, Erin R, PA-C      . chlorhexidine (PERIDEX) 0.12 % solution 15 mL  15 mL Mouth Rinse BID [name], MD   15 mL at 08/14/17 0916  . Chlorhexidine Gluconate Cloth 2 % PADS 6 each  6 each Topical Daily [name], MD  6 each at 08/13/17 1758  . Chlorhexidine Gluconate Cloth 2 % PADS 6 each  6 each Topical Daily [name], MD   6 each at 08/14/17 0919  . clonazePAM (KLONOPIN) tablet 0.5 mg  0.5 mg Oral BID PRN [name], MD      . cloNIDine (CATAPRES) tablet 0.1 mg  0.1 mg Oral BID Darlina Sicilian A, NP   0.1 mg at 08/14/17 0919  . docusate (COLACE) 50 MG/5ML liquid 100 mg  100 mg Per Tube BID PRN Scatliffe, Rise Paganini, MD   100 mg at 08/05/17 0958  . enoxaparin (LOVENOX) injection 40 mg  40 mg Subcutaneous Q24H [name], MD   40 mg at 08/14/17 0917  . feeding supplement (VITAL HIGH PROTEIN) liquid 1,000 mL  1,000 mL Per Tube Continuous Rush Farmer, MD 50 mL/hr at 08/12/17 1602  1,000 mL at 08/12/17 1602  . free water 300 mL  300 mL Per Tube Q6H [name], MD   300 mL at 08/14/17 0920  . hydrALAZINE (APRESOLINE) injection 10 mg  10 mg Intravenous Q6H PRN [name], MD   10 mg at 08/14/17 0329  . insulin aspart (novoLOG) injection 2-6 Units  2-6 Units Subcutaneous Q6H [name], MD      . levalbuterol Penne Lash) nebulizer solution 0.63 mg  0.63 mg Nebulization TID [name], MD   0.63 mg at 08/14/17 2542  . MEDLINE mouth rinse  15 mL Mouth Rinse q12n4p [name], MD      . metoprolol tartrate (LOPRESSOR) tablet 12.5 mg  12.5 mg Oral BID Simonne Maffucci B, MD   12.5 mg at 08/14/17 0918  . mupirocin cream (BACTROBAN) 2 %   Topical Daily [name], MD      . ondansetron [facility]) injection 4 mg  4 mg Intravenous Q6H PRN Barrett, Erin R, PA-C      . pantoprazole (PROTONIX) injection 40 mg  40 mg Intravenous Q12H [name], MD   40 mg at 08/14/17 0917  . phosphorus (K PHOS NEUTRAL) tablet 500 mg  500 mg Oral TID AC & HS Skeet Simmer, RPH   500 mg at 08/14/17 7062  . potassium chloride 10 mEq in 50 mL *CENTRAL LINE* IVPB  10 mEq Intravenous Daily PRN Barrett, Erin R, PA-C 50 mL/hr at 08/14/17 0921 10 mEq at 08/14/17 0921  . potassium chloride 20 MEQ/15ML (10%) solution 40 mEq  40 mEq Oral BID [name], MD      . risperiDONE (RISPERDAL) 1 MG/ML oral solution 0.5 mg  0.5 mg Per Tube BID PRN [name], MD      . senna-docusate (Senokot-S) tablet 1 tablet  1 tablet Oral QHS Barrett, Erin R, PA-C   1 tablet at 08/07/17 2112  . sodium chloride flush (NS) 0.9 % injection 10-40 mL  10-40 mL Intracatheter Q12H [name], MD   10 mL at 08/13/17 2134  . sodium chloride flush (NS) 0.9 % injection 10-40 mL  10-40 mL Intracatheter PRN [name], MD         Discharge Medications: Please see discharge summary for a list of discharge medications.  Relevant  Imaging Results:  Relevant Lab Results:   Additional Information SS#: [id number]  Jorge Ny, LCSW

## 2017-08-15 ENCOUNTER — Inpatient Hospital Stay (HOSPITAL_COMMUNITY): Payer: Medicare HMO

## 2017-08-15 ENCOUNTER — Ambulatory Visit: Payer: Medicare HMO | Admitting: Nurse Practitioner

## 2017-08-15 LAB — BASIC METABOLIC PANEL
ANION GAP: 8 (ref 5–15)
BUN: 41 mg/dL — AB (ref 6–20)
CALCIUM: 8.1 mg/dL — AB (ref 8.9–10.3)
CO2: 24 mmol/L (ref 22–32)
Chloride: 119 mmol/L — ABNORMAL HIGH (ref 101–111)
Creatinine, Ser: 0.94 mg/dL (ref 0.44–1.00)
GFR calc Af Amer: >60 mL/min (ref >60)
GLUCOSE: 152 mg/dL — AB (ref 65–99)
Potassium: 3.4 mmol/L — ABNORMAL LOW (ref 3.5–5.1)
Sodium: 151 mmol/L — ABNORMAL HIGH (ref 135–145)

## 2017-08-15 LAB — GLUCOSE, CAPILLARY
GLUCOSE-CAPILLARY: 156 mg/dL — AB (ref 65–99)
Glucose-Capillary: 102 mg/dL — ABNORMAL HIGH (ref 65–99)
Glucose-Capillary: 126 mg/dL — ABNORMAL HIGH (ref 65–99)
Glucose-Capillary: 143 mg/dL — ABNORMAL HIGH (ref 65–99)
Glucose-Capillary: 145 mg/dL — ABNORMAL HIGH (ref 65–99)

## 2017-08-15 MED ORDER — POTASSIUM CHLORIDE 20 MEQ/15ML (10%) PO SOLN
20.0000 meq | Freq: Two times a day (BID) | ORAL | Status: DC
  Administered 2017-08-15 – 2017-08-16 (×4): 20 meq via ORAL
  Filled 2017-08-15 (×4): qty 15

## 2017-08-15 NOTE — Progress Notes (Signed)
16 Days Post-Op Procedure(s) (LRB): VIDEO ASSISTED THORACOSCOPY (VATS)/WEDGE RESECTION (Right) LOBECTOMY (Right) Subjective: More alert, no complaints this AM  Objective: Vital signs in last 24 hours: Temp:  [98.1 F (36.7 C)-98.5 F (36.9 C)] 98.4 F (36.9 C) (10/12 0300) Pulse Rate:  [46-90] 80 (10/12 0700) Cardiac Rhythm: Normal sinus rhythm (10/11 2000) Resp:  [17-26] 24 (10/12 0700) BP: (96-150)/(56-92) 116/65 (10/12 0700) SpO2:  [95 %-100 %] 97 % (10/12 0700) Weight:  [176 lb 9.6 oz (80.1 kg)] 176 lb 9.6 oz (80.1 kg) (10/12 0600)  Hemodynamic parameters for last 24 hours:    Intake/Output from previous day: 10/11 0701 - 10/12 0700 In: 2401.3 [NG/GT:2251.3; IV Piggyback:150] Out: 1875 [Urine:1675; Stool:200] Intake/Output this shift: No intake/output data recorded.  General appearance: alert, cooperative and no distress Neurologic: intact Heart: regular rate and rhythm Lungs: wheezes faint on right Abdomen: normal findings: soft, non-tender Wound: clean and dry  Lab Results:  Recent Labs  08/13/17 0422 08/14/17 0312  WBC 11.1* 11.3*  HGB 9.7* 10.1*  HCT 32.2* 33.7*  PLT 275 283   BMET:  Recent Labs  08/14/17 0312 08/15/17 0403  NA 151* 151*  K 3.1* 3.4*  CL 117* 119*  CO2 26 24  GLUCOSE 119* 152*  BUN 45* 41*  CREATININE 0.97 0.94  CALCIUM 8.3* 8.1*    PT/INR: No results for input(s): LABPROT, INR in the last 72 hours. ABG    Component Value Date/Time   PHART 7.557 (H) 08/07/2017 0350   HCO3 30.0 (H) 08/07/2017 0350   TCO2 27 08/07/2017 1700   ACIDBASEDEF 1.4 08/05/2017 0406   O2SAT 99.0 08/07/2017 0350   CBG (last 3)   Recent Labs  08/14/17 2036 08/15/17 0004 08/15/17 0402  GLUCAP 123* 145* 143*    Assessment/Plan: S/P Procedure(s) (LRB): VIDEO ASSISTED THORACOSCOPY (VATS)/WEDGE RESECTION (Right) LOBECTOMY (Right) -NEURO- delirium resolved  Deconditioning and proximal motor weakness- continue to mobilize, PT/OT  Voice much  better today- hopefully can start taking PO soon  CV- stable in SR  RESP_ CT out without issue  Continue current neb regimen  RENAL- hypernatremia and hypokalemia persist but are stable  ENDO_ CBG well controlled  ID- afebrile, off antibiotics   LOS: 16 days    [name] 08/15/2017

## 2017-08-15 NOTE — Progress Notes (Signed)
      [address]       [place], [address]             [phone]      Stable day  BP (!) 132/59   Pulse 73   Temp 98.2 F (36.8 C) (Oral)   Resp 17   Ht 5' 2.75" (1.594 m)   Wt 176 lb 9.6 oz (80.1 kg)   SpO2 100%   BMI 31.53 kg/m    Intake/Output Summary (Last 24 hours) at 08/15/17 1745 Last data filed at 08/15/17 1730  Gross per 24 hour  Intake          2501.25 ml  Output              900 ml  Net          1601.25 ml    Remo Lipps C. Roxan Hockey, MD [facility] [phone]

## 2017-08-15 NOTE — Discharge Summary (Signed)
Physician Discharge Summary  Patient ID: Kathleen Guerrero MRN: [id number] DOB/AGE: 69-Feb-1947 69 y.o.  Admit date: 07/30/2017 Discharge date: 08/20/2017  Admission Diagnoses: Patient Active Problem List   Diagnosis Date Noted  . Solitary pulmonary nodule 07/21/2016   Discharge Diagnoses:  Active Problems:   S/P lobectomy of lung Stage IA non-small cell carcinoma of lung Postoperative delirium  Discharged Condition: good  HPI:  Kathleen Guerrero is a 71 year old woman with a past medical she significant for tobacco abuse (one pack per day 50 years), hypertension, dyslipidemia, and a stroke. She had a stroke affecting her left leg in September 2017. As part of her evaluation she had a CT of the neck. There was a right upper lobe groundglass opacity noted. A CT of the chest showed a 13 x 9 x 8 mm groundglass opacity in follow-up was recommended. She was seen in consultation by Dr. Melvyn Novas. Follow-up CTs and November and March showed no significant change. She recently had another follow-up CT which showed an increase in size to 13 x 20 mm. Dr. Melvyn Novas referred her for consideration for surgical resection.  She has had a nearly complete recovery from her stroke. She is able to ambulate without difficulty. She denies any chest pain, pressure, or tightness at rest or with exertion. She denies shortness of breath at rest or with exertion. She denies cough, hemoptysis, and wheezing. She still works. She lives by herself. She has not had any change in appetite or weight loss. She has not had any headaches or visual changes.  Hospital Course:  On 07/30/2017 Kathleen Guerrero underwent a right VATs and right upper lobectomy by Dr. Roxan Hockey. She tolerated the procedure well and was transferred to the ICU. She had some post-op delirium and agitation. We started lovenox for DVT prophylaxis. POD 2 we transfused one unit of blood for post-op anemia. We continued nebs for respiratory progression. We consulted critical care  for assistance with weaning of the ventilator. POD 3 she has some subcutaneous air. She remained sedated. She is following commands and we continued to wean sedation. We continued the chest tube due to massive Subq air and an air leak in the chest tube. She had evidence of AKI. On 9/30 she had a chest tube placed for subq air. POD 5 she remained intubated. She remained on antibiotics. POD 6 she remained critically ill with multiple organ failure. Her mental status remained a concern. POD 7 we continued a diuretic regimen for fluid overload. We replaced the electrolytes as needed. She remained agitated on light sedation. We did a CT of the head which did not show acute infarct. Intubated and sedated POD 9. She is able to shake her head yes and no. We continued supportive critical care. Pulmonary was also consulted to assist in maximizing pulmonary function. She received 1 unit of blood on 10/6 for anemia.  POD 12 plan to have CCM try and extubate. She completed a source of antibiotics for possible pneumonia. She was extubated on 10/8. Her delirum continued to improve. We were able to remove the mediastinal chest tube on POD 13. We continued to increase free water for hypernatremia.  We consulted PT for deconditioning. We placed her chest tube on water seal. We consulted inpatient rehab for deconditioning. POD 15 she continued to slowly progress. We discontinued her chest tube. We continued PT. POD 16 her hoarseness was improving. Her blood glucose level was better controlled. She remained in normal sinus rhythm. Her delirium was resolved.  She underwent  MBS which showed improved swallowing function.  Her NG tube was removed, tube feedings were discontinued.  She was started on a thin liquid diet and dysphagia 2 diet.  She is tolerating this without difficulty.  She will continue to work with SLP and advance diet as tolerated.  She has some minor skin dehiscence of her VATS incision.  There is no drainage or evidence  of infection.  It is imperative that the wound be kept clean and dry.  Steri strips have been placed and will fall off on their own.  Please notify the office should evidence of wound infection develop.  She continues to participate with PT/OT.  She remains hemodynamically stable.  She is felt stable for discharge to CIR today.   Consults: critical care  Significant Diagnostic Studies: CLINICAL DATA:  Chest pain.  Status post right lobectomy.  EXAM: PORTABLE CHEST 1 VIEW  COMPARISON:  Yesterday.  FINDINGS: The cardiac silhouette remains borderline enlarged. The lungs remain clear with mildly prominent interstitial markings and normal vascularity. Right hilar and right upper, lateral lung zone postsurgical changes. Stable small right apical and lateral pneumothorax. No significant change in subcutaneous emphysema on the right. Left PICC tip in the superior vena cava. Feeding tube extending into the stomach. Thoracic spine degenerative changes.  IMPRESSION: 1. Stable small right pneumothorax. 2. Stable right subcutaneous emphysema. 3. Stable mild chronic interstitial lung disease.   Electronically Signed   By: Claudie Revering M.D.   On: 08/15/2017 07:20   Treatments:   NAME:  TAKHIA, SPOON                ACCOUNT NO.:  [id number]  MEDICAL RECORD NO.:  [id number]  LOCATION:  [facility]                         FACILITY:  [facility]  PHYSICIAN:  Revonda Standard. Roxan Hockey, M.D.DATE OF BIRTH:  [birth year]  DATE OF PROCEDURE:  07/30/2017 DATE OF DISCHARGE:                              OPERATIVE REPORT   PREOPERATIVE DIAGNOSIS:  Right upper lobe lung nodule.  POSTOPERATIVE DIAGNOSIS:  Adenocarcinoma, right upper lobe, clinical stage IA.  PROCEDURE:   Right video-assisted thoracoscopy, Wedge resection of right upper lobe nodule, Thoracoscopic right upper lobectomy, Lymph node dissection, and Insertion of On-Q local anesthetic catheter.  SURGEON:  Revonda Standard. Roxan Hockey,  MD.  ASSISTANTEllwood Handler, PA.  ANESTHESIA:  General.  FINDINGS:  A 2-cm mass on lateral aspect of the right upper lobe. Frozen section revealed adenocarcinoma, incomplete fissures, multiple enlarged but otherwise benign-appearing lymph nodes.  CLINICAL NOTE:  Kathleen Guerrero is a 71 year old woman with a history of tobacco abuse, who was found to have a right upper lobe ground-glass opacity in 2017 during her evaluation for stroke.  A recent followup CT showed an increase in size and she was referred for surgical resection. The indications, risks, benefits, and alternatives were discussed in detail with the patient.  She understood and accepted the risks and agreed to proceed.  Disposition: CIR  Discharge Medications:    Allergies as of 08/20/2017   No Known Allergies     Medication List    STOP taking these medications   [employer] NICOTINE 2 MG lozenge Generic drug:  nicotine polacrilex   diphenhydrAMINE 25 MG tablet Commonly known as:  BENADRYL     TAKE  these medications   acetaminophen 325 MG tablet Commonly known as:  TYLENOL Take 650 mg by mouth daily as needed for mild pain or headache.   amiodarone 200 MG tablet Commonly known as:  PACERONE Take 1 tablet (200 mg total) by mouth daily.   amLODipine 10 MG tablet Commonly known as:  NORVASC TAKE 1 TABLET BY MOUTH EVERY DAY What changed:  See the new instructions.   aspirin 325 MG tablet TAKE 1 TABLET BY MOUTH EVERY DAY   atorvastatin 40 MG tablet Commonly known as:  LIPITOR TAKE 1 TABLET BY MOUTH EVERY DAY AT 6 p.m.   calcium carbonate 500 MG chewable tablet Commonly known as:  TUMS - dosed in mg elemental calcium Chew 1 tablet by mouth 4 (four) times daily as needed for indigestion or heartburn.   clonazePAM 0.5 MG tablet Commonly known as:  KLONOPIN Take 1 tablet (0.5 mg total) by mouth 2 (two) times daily as needed (anxiety or agitation).   cloNIDine 0.1 MG tablet Commonly known as:   CATAPRES Take 1 tablet (0.1 mg total) by mouth 2 (two) times daily.   Gerhardt's butt cream Crea Apply 1 application topically 4 (four) times daily as needed for irritation.   metoprolol tartrate 25 MG tablet Commonly known as:  LOPRESSOR Take 0.5 tablets (12.5 mg total) by mouth 2 (two) times daily. What changed:  medication strength  how much to take   pantoprazole 40 MG tablet Commonly known as:  PROTONIX Take 1 tablet (40 mg total) by mouth every 12 (twelve) hours.   phosphorus 155-852-130 MG tablet Commonly known as:  K PHOS NEUTRAL Take 2 tablets (500 mg total) by mouth 4 (four) times daily -  before meals and at bedtime.   polymixin-bacitracin 500-10000 UNIT/GM Oint ointment Apply 1 application topically 2 (two) times daily.   potassium chloride 20 MEQ/15ML (10%) Soln Take 30 mLs (40 mEq total) by mouth 2 (two) times daily.      Follow-up Information    [name], MD Follow up.   Specialty:  Cardiothoracic Surgery Why:  Your appointment is on 09/09/2017 at 11:00am. Please arrive at 10:30am for a chest xray at [facility] which is on the first floor of our building.  Contact information: [address] [place] [address] [phone]        [address]en Circle, FNP. Call in 1 day(s).   Specialties:  Family Medicine, Infectious Diseases Contact information: [place] [address] [phone]           Signed: Ellwood Handler 08/20/2017, 1:50 PM

## 2017-08-15 NOTE — Progress Notes (Signed)
  Speech Language Pathology Treatment: Dysphagia  Patient Details Name: Kathleen Guerrero MRN: [id number] DOB: 07-13-1946 Today's Date: 08/15/2017 Time: 7425-9563 SLP Time Calculation (min) (ACUTE ONLY): 25 min  Assessment / Plan / Recommendation Clinical Impression  Pt seen for expiratory muscle strength training to improve cough and pharyngeal contraction. Pt pleasant and cooperative with unfamiliar therapist, and appeared motivated to participate in treatment. Her daughter arrived mid-session.   Min verbal cues given for adequate labial closure and coordination of inhalation and exhalation during EMT exercises. Pt completed 3 sets of 5 repetitions, with marked decline in success on reps 4 & 5. A fourth set was attempted, however, pt unable to complete due to fatigue/poor endurance. Recommend continuing with 2 sets of 10, or 4 sets of 5 (which ever pt tolerates better) 3x/day as able. Endurance continues to be low, however, pt effort is improving.  Oral care for infection prevention also completed this session. Examination of oral cavity revealed slight dried mucous on hard palate. Trace milky secretions on glossopalatine arches noted. Pt immediately receptive to oral care, but requested SLP allow pt to do oral care herself. Hard dry mucus removed with suction. Milky secretions possibly extended into pharynx, as gag reflex was elicited and pt began to vomit. Nursing notified and ST session terminated.   ST will continue to follow pt for expiratory strength training with pt, as well as continuing education with pt and daughter.    HPI HPI: Pt with planned thoracoscopy wedge resection for RUL nodule 07/30/17. Required intubation 9/28 due to acute hypoxic respiratory failure. Pt's ETT was not maintaining cuff pressure and she required emergent anesthesia for tube change. Extubated 10/8 and pt pulled Cortrak tube. CXR Right chest tubes in place ; no convincing evidence of residual pneumothorax.  Right-sided postoperative volume loss and atelectasis. PMH: CVA 2017, GERD, HTN. Objective assessment recommended after BSE to fully assess swallow function (SLP rec'd FEES however pt preferred MBS).      SLP Plan  Continue with current plan of care       Recommendations  Diet recommendations: NPO Medication Administration: Via alternative means                Oral Care Recommendations: Oral care QID Follow up Recommendations: Inpatient Rehab SLP Visit Diagnosis: Dysphagia, pharyngoesophageal phase (R13.14) Plan: Continue with current plan of care       GO              Breanna Mcdaniel B. Quentin Ore [facility], CCC-SLP Speech Language Pathologist [phone]  Shonna Chock 08/15/2017, 2:28 PM

## 2017-08-16 ENCOUNTER — Inpatient Hospital Stay (HOSPITAL_COMMUNITY): Payer: Medicare HMO

## 2017-08-16 LAB — GLUCOSE, CAPILLARY
GLUCOSE-CAPILLARY: 143 mg/dL — AB (ref 65–99)
Glucose-Capillary: 135 mg/dL — ABNORMAL HIGH (ref 65–99)
Glucose-Capillary: 128 mg/dL — ABNORMAL HIGH (ref 65–99)
Glucose-Capillary: 126 mg/dL — ABNORMAL HIGH (ref 65–99)

## 2017-08-16 MED ORDER — AMIODARONE HCL 200 MG PO TABS
200.0000 mg | ORAL_TABLET | Freq: Every day | ORAL | Status: DC
  Administered 2017-08-16 – 2017-08-19 (×4): 200 mg via NASOGASTRIC
  Filled 2017-08-16 (×4): qty 1

## 2017-08-16 NOTE — Progress Notes (Signed)
17 Days Post-Op Procedure(s) (LRB): VIDEO ASSISTED THORACOSCOPY (VATS)/WEDGE RESECTION (Right) LOBECTOMY (Right) Subjective: No complaints this AM Wants coffee  Objective: Vital signs in last 24 hours: Temp:  [97.7 F (36.5 C)-98.4 F (36.9 C)] 98.1 F (36.7 C) (10/13 0843) Pulse Rate:  [68-85] 79 (10/13 0800) Cardiac Rhythm: Normal sinus rhythm (10/13 0800) Resp:  [15-26] 22 (10/13 0800) BP: (112-150)/(58-123) 134/76 (10/13 0800) SpO2:  [96 %-100 %] 100 % (10/13 0800) Weight:  [174 lb 13.2 oz (79.3 kg)] 174 lb 13.2 oz (79.3 kg) (10/13 0600)  Hemodynamic parameters for last 24 hours:    Intake/Output from previous day: 10/12 0701 - 10/13 0700 In: 2890 [NG/GT:2740; IV Piggyback:150] Out: 1600 [Urine:1600] Intake/Output this shift: Total I/O In: 90 [NG/GT:90] Out: 200 [Urine:200]  General appearance: alert, cooperative and no distress Neurologic: generalized weakness, hoarseness slowly improving Heart: regular rate and rhythm Lungs: faint wheezing at bases Abdomen: normal findings: soft, non-tender  Lab Results:  Recent Labs  08/14/17 0312  WBC 11.3*  HGB 10.1*  HCT 33.7*  PLT 283   BMET:  Recent Labs  08/14/17 0312 08/15/17 0403  NA 151* 151*  K 3.1* 3.4*  CL 117* 119*  CO2 26 24  GLUCOSE 119* 152*  BUN 45* 41*  CREATININE 0.97 0.94  CALCIUM 8.3* 8.1*    PT/INR: No results for input(s): LABPROT, INR in the last 72 hours. ABG    Component Value Date/Time   PHART 7.557 (H) 08/07/2017 0350   HCO3 30.0 (H) 08/07/2017 0350   TCO2 27 08/07/2017 1700   ACIDBASEDEF 1.4 08/05/2017 0406   O2SAT 99.0 08/07/2017 0350   CBG (last 3)   Recent Labs  08/15/17 1125 08/15/17 1542 08/16/17 0836  GLUCAP 156* 102* 135*    Assessment/Plan: S/P Procedure(s) (LRB): VIDEO ASSISTED THORACOSCOPY (VATS)/WEDGE RESECTION (Right) LOBECTOMY (Right) -Cv- maintaining SR  RESP- stable, continue IS  RENAL- recheck BMET in AM  ENDO- CBG well  controlled  Deconditioning- making progress, hopefully inpatient rehab next week  transfer to step down when bed available   LOS: 17 days    Kathleen Guerrero 08/16/2017

## 2017-08-16 NOTE — Progress Notes (Signed)
      [address]       [place] [address]             [phone]      No confusion today  Still a 2 person assist  BP 136/63   Pulse 73   Temp 97.9 F (36.6 C) (Oral)   Resp 16   Ht 5' 2.75" (1.594 m)   Wt 174 lb 13.2 oz (79.3 kg)   SpO2 99%   BMI 31.22 kg/m   Intake/Output Summary (Last 24 hours) at 08/16/17 1754 Last data filed at 08/16/17 1610  Gross per 24 hour  Intake             2635 ml  Output             1750 ml  Net              885 ml    Awaiting bed on 4E  Trissa Molina C. Roxan Hockey, MD [facility] [phone]

## 2017-08-16 NOTE — Plan of Care (Signed)
Problem: Activity: Goal: Risk for activity intolerance will decrease Outcome: Progressing Patient progressing to chair

## 2017-08-16 NOTE — Plan of Care (Signed)
Problem: Fluid Volume: Goal: Ability to maintain a balanced intake and output will improve Outcome: Progressing Patient on TF at this time, but she did fail swallow screen today   Problem: Nutrition: Goal: Adequate nutrition will be maintained Outcome: Progressing Patient on TF at this time and nutrition is adequate

## 2017-08-17 LAB — GLUCOSE, CAPILLARY
GLUCOSE-CAPILLARY: 95 mg/dL (ref 65–99)
GLUCOSE-CAPILLARY: 123 mg/dL — AB (ref 65–99)
Glucose-Capillary: 94 mg/dL (ref 65–99)
Glucose-Capillary: 111 mg/dL — ABNORMAL HIGH (ref 65–99)

## 2017-08-17 LAB — BASIC METABOLIC PANEL
ANION GAP: 6 (ref 5–15)
BUN: 34 mg/dL — ABNORMAL HIGH (ref 6–20)
CHLORIDE: 116 mmol/L — AB (ref 101–111)
CO2: 26 mmol/L (ref 22–32)
Calcium: 7.9 mg/dL — ABNORMAL LOW (ref 8.9–10.3)
Creatinine, Ser: 0.83 mg/dL (ref 0.44–1.00)
GFR calc non Af Amer: >60 mL/min (ref >60)
GLUCOSE: 152 mg/dL — AB (ref 65–99)
Potassium: 3 mmol/L — ABNORMAL LOW (ref 3.5–5.1)
Sodium: 148 mmol/L — ABNORMAL HIGH (ref 135–145)

## 2017-08-17 LAB — CBC
HEMATOCRIT: 31.9 % — AB (ref 36.0–46.0)
HEMOGLOBIN: 9.8 g/dL — AB (ref 12.0–15.0)
MCH: 28.3 pg (ref 26.0–34.0)
MCHC: 30.7 g/dL (ref 30.0–36.0)
MCV: 92.2 fL (ref 78.0–100.0)
Platelets: 243 10*3/uL (ref 150–400)
RBC: 3.46 MIL/uL — ABNORMAL LOW (ref 3.87–5.11)
RDW: 15.5 % (ref 11.5–15.5)
WBC: 12 10*3/uL — ABNORMAL HIGH (ref 4.0–10.5)

## 2017-08-17 MED ORDER — CHLORHEXIDINE GLUCONATE CLOTH 2 % EX PADS
6.0000 | MEDICATED_PAD | Freq: Every day | CUTANEOUS | Status: DC
  Administered 2017-08-18 – 2017-08-20 (×4): 6 via TOPICAL

## 2017-08-17 MED ORDER — POTASSIUM CHLORIDE 20 MEQ/15ML (10%) PO SOLN
40.0000 meq | Freq: Two times a day (BID) | ORAL | Status: DC
  Administered 2017-08-17 – 2017-08-20 (×7): 40 meq via ORAL
  Filled 2017-08-17 (×7): qty 30

## 2017-08-17 NOTE — Progress Notes (Signed)
18 Days Post-Op Procedure(s) (LRB): VIDEO ASSISTED THORACOSCOPY (VATS)/WEDGE RESECTION (Right) LOBECTOMY (Right) Subjective: Wants a coke. Denies pain  Objective: Vital signs in last 24 hours: Temp:  [97.9 F (36.6 C)-98.7 F (37.1 C)] 98.2 F (36.8 C) (10/14 0851) Pulse Rate:  [72-82] 72 (10/14 0758) Cardiac Rhythm: Normal sinus rhythm (10/14 0400) Resp:  [16-20] 19 (10/14 0758) BP: (121-160)/(61-73) 121/61 (10/14 0758) SpO2:  [96 %-100 %] 96 % (10/14 0759) Weight:  [175 lb 11.3 oz (79.7 kg)] 175 lb 11.3 oz (79.7 kg) (10/14 0500)  Hemodynamic parameters for last 24 hours:    Intake/Output from previous day: 10/13 0701 - 10/14 0700 In: 1630 [NG/GT:1630] Out: 1100 [Urine:1100] Intake/Output this shift: No intake/output data recorded.  General appearance: alert, cooperative and no distress Neurologic: intact Heart: regular rate and rhythm Lungs: diminished breath sounds bibasilar  Lab Results:  Recent Labs  08/17/17 0440  WBC 12.0*  HGB 9.8*  HCT 31.9*  PLT 243   BMET:  Recent Labs  08/15/17 0403 08/17/17 0440  NA 151* 148*  K 3.4* 3.0*  CL 119* 116*  CO2 24 26  GLUCOSE 152* 152*  BUN 41* 34*  CREATININE 0.94 0.83  CALCIUM 8.1* 7.9*    PT/INR: No results for input(s): LABPROT, INR in the last 72 hours. ABG    Component Value Date/Time   PHART 7.557 (H) 08/07/2017 0350   HCO3 30.0 (H) 08/07/2017 0350   TCO2 27 08/07/2017 1700   ACIDBASEDEF 1.4 08/05/2017 0406   O2SAT 99.0 08/07/2017 0350   CBG (last 3)   Recent Labs  08/16/17 2121 08/17/17 0237 08/17/17 0845  GLUCAP 143* 94 95    Assessment/Plan: S/P Procedure(s) (LRB): VIDEO ASSISTED THORACOSCOPY (VATS)/WEDGE RESECTION (Right) LOBECTOMY (Right) Plan for transfer to step-down: see transfer orders  Awaiting transfer to step down Maintaining SR Lungs are clear and is on RA Tolerating TF For repeat swallow Tuesday Voice is better but still hoarse Should be ready for CIR at this  point Will repeat CXR in AM   LOS: 18 days    Kathleen Guerrero 08/17/2017

## 2017-08-17 NOTE — Progress Notes (Signed)
      [address]       [place] [address]             [phone]      Stable day  Still awaiting bed on 4E  Chidi Shirer C. Roxan Hockey, MD [facility] [phone]

## 2017-08-18 ENCOUNTER — Inpatient Hospital Stay (HOSPITAL_COMMUNITY): Payer: Medicare HMO

## 2017-08-18 ENCOUNTER — Ambulatory Visit (INDEPENDENT_AMBULATORY_CARE_PROVIDER_SITE_OTHER): Payer: Medicare HMO | Admitting: *Deleted

## 2017-08-18 DIAGNOSIS — I639 Cerebral infarction, unspecified: Secondary | ICD-10-CM

## 2017-08-18 LAB — GLUCOSE, CAPILLARY
GLUCOSE-CAPILLARY: 134 mg/dL — AB (ref 65–99)
GLUCOSE-CAPILLARY: 145 mg/dL — AB (ref 65–99)
GLUCOSE-CAPILLARY: 111 mg/dL — AB (ref 65–99)
Glucose-Capillary: 112 mg/dL — ABNORMAL HIGH (ref 65–99)
Glucose-Capillary: 144 mg/dL — ABNORMAL HIGH (ref 65–99)
Glucose-Capillary: 147 mg/dL — ABNORMAL HIGH (ref 65–99)

## 2017-08-18 LAB — BASIC METABOLIC PANEL
Anion gap: 8 (ref 5–15)
BUN: 29 mg/dL — AB (ref 6–20)
CALCIUM: 8.1 mg/dL — AB (ref 8.9–10.3)
CO2: 24 mmol/L (ref 22–32)
Chloride: 115 mmol/L — ABNORMAL HIGH (ref 101–111)
Creatinine, Ser: 0.84 mg/dL (ref 0.44–1.00)
GFR calc Af Amer: >60 mL/min (ref >60)
GLUCOSE: 130 mg/dL — AB (ref 65–99)
Potassium: 3.5 mmol/L (ref 3.5–5.1)
Sodium: 147 mmol/L — ABNORMAL HIGH (ref 135–145)

## 2017-08-18 MED ORDER — INSULIN ASPART 100 UNIT/ML ~~LOC~~ SOLN
2.0000 [IU] | Freq: Four times a day (QID) | SUBCUTANEOUS | Status: DC
  Administered 2017-08-18 – 2017-08-19 (×4): 2 [IU] via SUBCUTANEOUS
  Administered 2017-08-20: 4 [IU] via SUBCUTANEOUS

## 2017-08-18 MED ORDER — GERHARDT'S BUTT CREAM
TOPICAL_CREAM | Freq: Four times a day (QID) | CUTANEOUS | Status: DC | PRN
  Administered 2017-08-18: 1 via TOPICAL
  Filled 2017-08-18 (×2): qty 1

## 2017-08-18 NOTE — Progress Notes (Signed)
[facility] Summary Report / Loop Recorder 

## 2017-08-18 NOTE — Progress Notes (Addendum)
Pt dtr chooses [facility] for rehab if CIR is not approved- CSW will continue to follow  Jorge Ny, [place] Social Worker [phone]

## 2017-08-18 NOTE — Progress Notes (Signed)
Rehab admissions - I met with patient and her daughter on Friday about CIR.  I gave them booklets.  I have opened the case with [facility] requesting acute inpatient rehab admission.  I await call back from insurance case manager.  Call me for questions.  #[phone]

## 2017-08-18 NOTE — Progress Notes (Signed)
Physical Therapy Treatment Patient Details Name: Kathleen Guerrero MRN: [id number] DOB: Nov 14, 1945 Today's Date: 08/18/2017    History of Present Illness 71 y/o female underwent resection of her RUL on 9/26, developed worsening respiratory failure on 9/28 requiring intubation with extubation 10/8. PMHx: tobacco abuse (one pack per day 50 years), hypertension, dyslipidemia, and a stroke    PT Comments    Pt much improved from last week. Pt alert and wide awake with 100% participation in PT. Pt was able to tolerate short distance ambulation this date despite anxiety. Pt c/o "my back is going to give out". Pt remains appropriate for CIR upon d/c and is motivated to return to independence. Acute PT to follow.    Follow Up Recommendations  CIR;Supervision/Assistance - 24 hour     Equipment Recommendations  None recommended by PT    Recommendations for Other Services OT consult     Precautions / Restrictions Precautions Precautions: Fall Restrictions Weight Bearing Restrictions: No    Mobility  Bed Mobility Overal bed mobility: Needs Assistance Bed Mobility: Rolling;Sidelying to Sit Rolling: Min assist Sidelying to sit: Min assist       General bed mobility comments: pt able to initiate rolling and bring LEs off EOB, minA for trunk elevation and to achieve full sidelying  Transfers Overall transfer level: Needs assistance Equipment used: Rolling walker (2 wheeled) Transfers: Sit to/from Stand Sit to Stand: Mod assist         General transfer comment: pt able to push up from bed and chair, much improved from last week  Ambulation/Gait Ambulation/Gait assistance: Min assist;+2 safety/equipment (chair follow) Ambulation Distance (Feet): 20 Feet (x1, 10x1) Assistive device: Rolling walker (2 wheeled) Gait Pattern/deviations: Step-through pattern;Decreased stride length Gait velocity: slow Gait velocity interpretation: Below normal speed for age/gender General Gait  Details: pt very anxious requiring v/c's for deep breathing and to stay focus on ambuation. pt c/o "my back is getting really tired and will give out" Pt however also very excited about being able to ambulate and see "outside"   Stairs            Wheelchair Mobility    Modified Rankin (Stroke Patients Only)       Balance Overall balance assessment: Needs assistance Sitting-balance support: Feet supported;No upper extremity supported Sitting balance-Leahy Scale: Good     Standing balance support: Bilateral upper extremity supported Standing balance-Leahy Scale: Poor Standing balance comment: requires RW                            Cognition Arousal/Alertness: Awake/alert Behavior During Therapy: WFL for tasks assessed/performed;Anxious Overall Cognitive Status: Within Functional Limits for tasks assessed                                        Exercises General Exercises - Lower Extremity Ankle Circles/Pumps: AROM;Both;10 reps;Supine Quad Sets: AROM;Both;10 reps;Supine Long Arc Quad: AROM;Both;10 reps;Seated    General Comments General comments (skin integrity, edema, etc.): VSS,       Pertinent Vitals/Pain Pain Assessment: No/denies pain    Home Living                      Prior Function            PT Goals (current goals can now be found in the care plan section) Progress towards PT  goals: Progressing toward goals    Frequency    Min 3X/week      PT Plan Current plan remains appropriate    Co-evaluation              AM-PAC PT "6 Clicks" Daily Activity  Outcome Measure  Difficulty turning over in bed (including adjusting bedclothes, sheets and blankets)?: A Lot Difficulty moving from lying on back to sitting on the side of the bed? : A Lot Difficulty sitting down on and standing up from a chair with arms (e.g., wheelchair, bedside commode, etc,.)?: A Lot Help needed moving to and from a bed to chair  (including a wheelchair)?: A Lot Help needed walking in hospital room?: A Little Help needed climbing 3-5 steps with a railing? : Total 6 Click Score: 12    End of Session Equipment Utilized During Treatment: Gait belt Activity Tolerance: Patient limited by fatigue Patient left: in chair;with call bell/phone within reach Nurse Communication: Mobility status PT Visit Diagnosis: Other abnormalities of gait and mobility (R26.89);Difficulty in walking, not elsewhere classified (R26.2);Muscle weakness (generalized) (M62.81)     Time: 1749-4496 PT Time Calculation (min) (ACUTE ONLY): 33 min  Charges:  $Gait Training: 8-22 mins $Therapeutic Exercise: 8-22 mins                    G Codes:       Kittie Plater, PT, DPT Pager #: [phone] Office #: [phone]    [place] 08/18/2017, 2:45 PM

## 2017-08-18 NOTE — Progress Notes (Signed)
Occupational Therapy Treatment Patient Details Name: Kathleen Guerrero MRN: [id number] DOB: Oct 14, 1946 Today's Date: 08/18/2017    History of present illness 71 y/o female underwent resection of her RUL on 9/26, developed worsening respiratory failure on 9/28 requiring intubation with extubation 10/8. PMHx: tobacco abuse (one pack per day 50 years), hypertension, dyslipidemia, and a stroke   OT comments  Pt is making steady progress with OT.  She requires min A for bed mobility.  She is now able to perform ADLs with min - max A and min A for functional transfers.  She is very motivated to regain independence.  Feel she will benefit from CIR to allow her to return home at supervision - min A level with assist from family.    Follow Up Recommendations  CIR;Supervision/Assistance - 24 hour    Equipment Recommendations  None recommended by OT    Recommendations for Other Services Rehab consult    Precautions / Restrictions Precautions Precautions: Fall Restrictions Weight Bearing Restrictions: No       Mobility Bed Mobility Overal bed mobility: Needs Assistance Bed Mobility: Supine to Sit;Sit to Supine   Sidelying to sit: Min assist Supine to sit: Min assist     General bed mobility comments: Pt requires verbal cues for sequencing and assist to scoot hips to EOB as well as to lift LEs back onto bed   Transfers Overall transfer level: Needs assistance Equipment used: Rolling walker (2 wheeled) Transfers: Sit to/from [facility] Sit to Stand: Min assist Stand pivot transfers: Min assist       General transfer comment: min A to steday and mod cues for sequencing and safe technique     Balance Overall balance assessment: Needs assistance Sitting-balance support: Feet supported;No upper extremity supported Sitting balance-Leahy Scale: Fair     Standing balance support: Bilateral upper extremity supported Standing balance-Leahy Scale: Poor Standing balance  comment: reliant on UE support                            ADL either performed or assessed with clinical judgement   ADL Overall ADL's : Needs assistance/impaired Eating/Feeding: NPO   Grooming: Wash/dry hands;Wash/dry face;Oral care;Brushing hair;Sitting;Minimal assistance   Upper Body Bathing: Moderate assistance;Sitting   Lower Body Bathing: Maximal assistance;Sit to/from stand           Toilet Transfer: Minimal assistance;Stand-pivot;BSC;RW   Toileting- Clothing Manipulation and Hygiene: Maximal assistance;Sit to/from stand       Functional mobility during ADLs: Minimal assistance;Rolling walker       Vision       Perception     Praxis      Cognition Arousal/Alertness: Awake/alert Behavior During Therapy: WFL for tasks assessed/performed Overall Cognitive Status: Impaired/Different from baseline Area of Impairment: Attention;Memory;Safety/judgement;Problem solving                   Current Attention Level: Selective Memory: Decreased short-term memory Following Commands: Follows one step commands consistently;Follows multi-step commands inconsistently Safety/Judgement: Decreased awareness of safety   Problem Solving: Slow processing;Requires verbal cues;Requires tactile cues General Comments: cognition/processing worsens as she fatigues.  She frequently repeats things and askes repeated questions.          Exercises     Shoulder Instructions       General Comments VSS    Pertinent Vitals/ Pain       Pain Assessment: No/denies pain  Home Living  Prior Functioning/Environment              Frequency  Min 2X/week        Progress Toward Goals  OT Goals(current goals can now be found in the care plan section)  Progress towards OT goals: Progressing toward goals     Plan Discharge plan remains appropriate    Co-evaluation                 AM-PAC PT  "6 Clicks" Daily Activity     Outcome Measure   Help from another person eating meals?: Total Help from another person taking care of personal grooming?: A Little Help from another person toileting, which includes using toliet, bedpan, or urinal?: A Lot Help from another person bathing (including washing, rinsing, drying)?: A Lot Help from another person to put on and taking off regular upper body clothing?: A Lot Help from another person to put on and taking off regular lower body clothing?: Total 6 Click Score: 11    End of Session Equipment Utilized During Treatment: Gait belt;Rolling walker  OT Visit Diagnosis: Unsteadiness on feet (R26.81)   Activity Tolerance Patient tolerated treatment well   Patient Left in bed;with call bell/phone within reach;with bed alarm set;with family/visitor present;with nursing/sitter in room   Nurse Communication Mobility status        Time: 1735-6701 OT Time Calculation (min): 38 min  Charges: OT General Charges $OT Visit: 1 Visit OT Treatments $Therapeutic Activity: 38-52 mins  [facility], OTR/L [phone]    Lucille Passy M 08/18/2017, 6:37 PM

## 2017-08-18 NOTE — Progress Notes (Signed)
19 Days Post-Op Procedure(s) (LRB): VIDEO ASSISTED THORACOSCOPY (VATS)/WEDGE RESECTION (Right) LOBECTOMY (Right) Subjective: Wants ice chips, c/o dry mouth  Objective: Vital signs in last 24 hours: Temp:  [97.5 F (36.4 C)-98.4 F (36.9 C)] 97.9 F (36.6 C) (10/15 0748) Pulse Rate:  [64-84] 78 (10/15 0600) Cardiac Rhythm: Normal sinus rhythm (10/15 0748) Resp:  [14-22] 16 (10/15 0400) BP: (113-130)/(58-75) 123/68 (10/15 0748) SpO2:  [95 %-100 %] 95 % (10/15 0600) Weight:  [176 lb 2.4 oz (79.9 kg)] 176 lb 2.4 oz (79.9 kg) (10/15 0600)  Hemodynamic parameters for last 24 hours:    Intake/Output from previous day: 10/14 0701 - 10/15 0700 In: 1970 [NG/GT:1870; IV Piggyback:100] Out: 850 [Urine:850] Intake/Output this shift: No intake/output data recorded.  General appearance: alert, cooperative and no distress Neurologic: intact Heart: regular rate and rhythm Lungs: diminished breath sounds right base Abdomen: normal findings: soft, non-tender  Lab Results:  Recent Labs  08/17/17 0440  WBC 12.0*  HGB 9.8*  HCT 31.9*  PLT 243   BMET:  Recent Labs  08/17/17 0440 08/18/17 0527  NA 148* 147*  K 3.0* 3.5  CL 116* 115*  CO2 26 24  GLUCOSE 152* 130*  BUN 34* 29*  CREATININE 0.83 0.84  CALCIUM 7.9* 8.1*    PT/INR: No results for input(s): LABPROT, INR in the last 72 hours. ABG    Component Value Date/Time   PHART 7.557 (H) 08/07/2017 0350   HCO3 30.0 (H) 08/07/2017 0350   TCO2 27 08/07/2017 1700   ACIDBASEDEF 1.4 08/05/2017 0406   O2SAT 99.0 08/07/2017 0350   CBG (last 3)   Recent Labs  08/17/17 1513 08/17/17 1937 08/18/17 0156  GLUCAP 123* 111* 145*    Assessment/Plan: S/P Procedure(s) (LRB): VIDEO ASSISTED THORACOSCOPY (VATS)/WEDGE RESECTION (Right) LOBECTOMY (Right) Plan for transfer to step-down: see transfer orders  Still awaiting bed on tele She is ready to go to rehab from a medical perspective Voice is stronger Remains NPO and on  TF    LOS: 19 days    [name] 08/18/2017

## 2017-08-18 NOTE — Plan of Care (Signed)
Dr. Roxan Hockey made aware of PICC line placement per chest film

## 2017-08-18 NOTE — Progress Notes (Signed)
Rehab admissions - I have not heard back from [facility] carrier about possible inpatient rehab admission.  I will have one of my partners follow up in am once they hear back from insurance case manager.  Call me for questions.  #[phone]

## 2017-08-18 NOTE — Plan of Care (Signed)
Problem: Safety: Goal: Ability to remain free from injury will improve Outcome: Progressing Patient encouraged to use call bell. Bed alarm on. Staff with will continue to make safety rounds to prevent fall this shift.

## 2017-08-18 NOTE — Progress Notes (Signed)
Patient transferred from [facility] via wheelchair, assessment completed see flowsheet, placed on tele ccmd notified, patient oriented to room and staff, bed in lowest position, call bell within reach will continue to monitor.

## 2017-08-18 NOTE — Plan of Care (Signed)
Talked with Lorre Nick (IPR) about pt's need to talk with someone about IPR.

## 2017-08-18 NOTE — Progress Notes (Signed)
  Speech Language Pathology Treatment: Dysphagia  Patient Details Name: Kathleen Guerrero MRN: [id number] DOB: 11-Oct-1946 Today's Date: 08/18/2017 Time: 3704-8889 SLP Time Calculation (min) (ACUTE ONLY): 16 min  Assessment / Plan / Recommendation Clinical Impression  Pt much more alert; vocal quality mildly hoarse versus breathy last week and increased/improved intensity. Following oral decontamination/infection control care, pt given ice chips and 1/2 tsp sips water. No outward s/s aspiration however pt silently aspirated during previous MBS. Will repeat MBS tomorrow. Allowed to have ice chips intermittently following oral care.    HPI HPI: Pt with planned thoracoscopy wedge resection for RUL nodule 07/30/17. Required intubation 9/28 due to acute hypoxic respiratory failure. Pt's ETT was not maintaining cuff pressure and she required emergent anesthesia for tube change. Extubated 10/8 and pt pulled Cortrak tube. CXR Right chest tubes in place ; no convincing evidence of residual pneumothorax. Right-sided postoperative volume loss and atelectasis. PMH: CVA 2017, GERD, HTN. Objective assessment recommended after BSE to fully assess swallow function (SLP rec'd FEES however pt preferred MBS).      SLP Plan  MBS       Recommendations  Diet recommendations: Other(comment) (ice chips only) Liquids provided via: Teaspoon Medication Administration: Via alternative means                Oral Care Recommendations: Oral care QID Follow up Recommendations: Inpatient Rehab SLP Visit Diagnosis: Dysphagia, pharyngoesophageal phase (R13.14) Plan: MBS       GO                Houston Siren 08/18/2017, 2:06 PM   Orbie Pyo Bralynn Velador M.Ed [employer] [phone]

## 2017-08-18 NOTE — Progress Notes (Signed)
Patient transported to 4E-01 via w/c. A/Ox4. Daughter Anda Kraft, called and updated on move as well as what took place today. All personal belongings sent with patient. Report given to [place], [place]

## 2017-08-18 NOTE — Plan of Care (Signed)
Left message for inpatient rehab that patient wishes to speak with them again

## 2017-08-19 ENCOUNTER — Inpatient Hospital Stay (HOSPITAL_COMMUNITY): Payer: Medicare HMO

## 2017-08-19 LAB — GLUCOSE, CAPILLARY: GLUCOSE-CAPILLARY: 112 mg/dL — AB (ref 65–99)

## 2017-08-19 MED ORDER — PANTOPRAZOLE SODIUM 40 MG PO TBEC
40.0000 mg | DELAYED_RELEASE_TABLET | Freq: Two times a day (BID) | ORAL | Status: DC
  Administered 2017-08-19 – 2017-08-20 (×2): 40 mg via ORAL
  Filled 2017-08-19 (×2): qty 1

## 2017-08-19 NOTE — Care Management Important Message (Signed)
Important Message  Patient Details  Name: Kathleen Guerrero MRN: [id number] Date of Birth: 1946/05/01   Medicare Important Message Given:  Yes    Nathen May 08/19/2017, 10:27 AM

## 2017-08-19 NOTE — PMR Pre-admission (Signed)
PMR Admission Coordinator Pre-Admission Assessment  Patient: Kathleen Guerrero is an 71 y.o., female MRN: [id number] DOB: [birth year] Height: 5' 2.75" (159.4 cm) Weight: 81 kg (178 lb 9.2 oz)              Insurance Information HMO:     PPO: yes     PCP:      IPA:      80/20:      OTHER: Medicare advantage plan PRIMARY: [employer] Medicare      Policy#: [id number]      Subscriber: pt CM Name: Verdis Frederickson      Phone#: [phone]     Fax#: [phone] Pre-Cert#: [id number]    Approved until 10/22 with f/u CM [phone]  Employer: retired Benefits:  Phone #: [phone]     Name: 08/18/2017 Eff. Date: 11/04/2014     Deduct: none      Out of Pocket Max: $4500      Life Max: none CIR: $250 copy per day days 1 though 6 then covers 100%      SNF: no co pay days 1 through 20; $164 co pay per day days 21-100 Outpatient: $40 co pay per visit     Co-Pay:  Visits per medical neccesity Home Health: 100%      Co-Pay: visits per medical neccesity DME: 80%     Co-Pay: 20% Providers: in network  SECONDARY: none       Medicaid Application Date:       Case Manager:  Disability Application Date:       Case Worker:   Emergency Facilities manager Information    Name Relation Home Work Mobile   Lambert Daughter [phone]       Current Medical History  Patient Admitting Diagnosis: 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 Per chart review patient lives alone independently prior to admission and still working. One level home 6 steps to entry. As part of patient's workup from CVA 2017 she had a CT of her neck that showed right upper lobe groundglass opacity. CT the chest showed a 13 x 9 x 8 mm opacity and follow-up recommended. She was seen outpatient by Dr. Melvyn Novas with follow-up CTs November in March showing no  significant change. Recently another follow-up scan showed increase in size to 13 x 20 mm.She was admitted 07/17/2017 for ongoing evaluation and care with right upper lobe wedge resection biopsy adenocarcinoma. Underwent VATS, thoracic right upper lobectomy, lymph node dissection 07/30/2017 per Dr. Roxan Hockey. Hospital course pain management. On 08/01/2017 patient developed acute respiratory distress accompanied by crepitus over her right chest wall and was intubated. Chest x-ray showed small apical pneumothorax. A chest tube was later placed 08/03/2017 and has since been removed. Patient later extubated 08/11/2017. Hospital course acute blood loss anemia and monitored. . Initially with nasogastric tube feeds modified barium swallow completed diet has been advanced dysphagia #2 thin liquids. Subcutaneous Lovenox added for DVT prophylaxis. Cortrak removed.  Past Medical History  Past Medical History:  Diagnosis Date  . Arthritis   . GERD (gastroesophageal reflux disease)    tums  . Headache   . Hepatitis    cmv 30 yrs ago hepatitis  . History of hiatal hernia   . History of loop recorder    after stroke  . Hypertension   . Stroke [facility]) 07/2016  balance a little off  . Tobacco use     Family History  family history includes Cancer in her maternal grandfather; Kidney disease in her maternal grandmother.  Prior Rehab/Hospitalizations:  Has the patient had major surgery during 100 days prior to admission? No  Current Medications   Current Facility-Administered Medications:  .  [START ON 08/21/2017] amiodarone (PACERONE) tablet 200 mg, 200 mg, Oral, Daily, Hammons, Kimberly B, RPH .  amLODipine (NORVASC) tablet 10 mg, 10 mg, Oral, Daily, [name], MD, 10 mg at 08/20/17 1036 .  atorvastatin (LIPITOR) tablet 40 mg, 40 mg, Oral, q1800, Barrett, Erin R, PA-C, 40 mg at 08/19/17 1719 .  bisacodyl (DULCOLAX) EC tablet 10 mg, 10 mg, Oral, Daily, Barrett, Erin R, PA-C, 10 mg at  08/20/17 1036 .  calcium carbonate (TUMS - dosed in mg elemental calcium) chewable tablet 200 mg of elemental calcium, 1 tablet, Oral, QID PRN, Barrett, Erin R, PA-C .  chlorhexidine (PERIDEX) 0.12 % solution 15 mL, 15 mL, Mouth Rinse, BID, [name], MD, 15 mL at 08/19/17 2108 .  Chlorhexidine Gluconate Cloth 2 % PADS 6 each, 6 each, Topical, Daily, [name], MD, 6 each at 08/20/17 1251 .  clonazePAM (KLONOPIN) tablet 0.5 mg, 0.5 mg, Oral, BID PRN, [name], MD, 0.5 mg at 08/19/17 2108 .  cloNIDine (CATAPRES) tablet 0.1 mg, 0.1 mg, Oral, BID, Whiteheart, Kathryn A, NP, 0.1 mg at 08/20/17 1036 .  docusate (COLACE) 50 MG/5ML liquid 100 mg, 100 mg, Per Tube, BID PRN, Scatliffe, Kristen D, MD, 100 mg at 08/05/17 0958 .  enoxaparin (LOVENOX) injection 40 mg, 40 mg, Subcutaneous, Q24H, [name], MD, 40 mg at 08/20/17 1036 .  Gerhardt's butt cream, , Topical, QID PRN, [name], MD, 1 application at 27/06/23 1414 .  hydrALAZINE (APRESOLINE) injection 10 mg, 10 mg, Intravenous, Q6H PRN, [name], MD, 10 mg at 08/14/17 0329 .  insulin aspart (novoLOG) injection 2-6 Units, 2-6 Units, Subcutaneous, Q6H, [name], MD, 4 Units at 08/20/17 1251 .  levalbuterol (XOPENEX) nebulizer solution 0.63 mg, 0.63 mg, Nebulization, BID, [name], MD, 0.63 mg at 08/20/17 0939 .  MEDLINE mouth rinse, 15 mL, Mouth Rinse, q12n4p, [name], MD, 15 mL at 08/20/17 1000 .  metoprolol tartrate (LOPRESSOR) tablet 12.5 mg, 12.5 mg, Oral, BID, McQuaid, Douglas B, MD, 12.5 mg at 08/20/17 1036 .  mupirocin cream (BACTROBAN) 2 %, , Topical, Daily, [name], MD .  ondansetron [facility]) injection 4 mg, 4 mg, Intravenous, Q6H PRN, Barrett, Erin R, PA-C .  pantoprazole (PROTONIX) EC tablet 40 mg, 40 mg, Oral, Q12H, Alvira Philips, RPH, 40 mg at 08/20/17 1036 .  phosphorus (K PHOS NEUTRAL) tablet 500 mg, 500 mg,  Oral, TID AC & HS, Skeet Simmer, RPH, 500 mg at 08/20/17 1037 .  polymixin-bacitracin (POLYSPORIN) ointment 1 application, 1 application, Topical, BID, Hammons, Theone Murdoch, RPH .  potassium chloride 10 mEq in 50 mL *CENTRAL LINE* IVPB, 10 mEq, Intravenous, Daily PRN, Barrett, Erin R, PA-C, Stopped at 08/18/17 1053 .  potassium chloride 20 MEQ/15ML (10%) solution 40 mEq, 40 mEq, Oral, BID, [name], MD, 40 mEq at 08/20/17 1036 .  senna-docusate (Senokot-S) tablet 1 tablet, 1 tablet, Oral, QHS, Barrett, Erin R, PA-C, Stopped at 08/14/17 2042 .  sodium chloride flush (NS) 0.9 % injection 10-40 mL, 10-40 mL, Intracatheter, PRN, [name], MD, 10 mL at 08/17/17 0954  Patients Current  Diet: DIET DYS 2 Room service appropriate? Yes; Fluid consistency: Thin Meds whole in puree  Precautions / Restrictions Precautions Precautions: Fall Precaution Comments: chest tube Restrictions Weight Bearing Restrictions: No   Has the patient had 2 or more falls or a fall with injury in the past year?No  Prior Activity Level Community (5-7x/wk): Independent pta  Development worker, international aid / Equipment Home Assistive Devices/Equipment: Eyeglasses, Radio producer (specify quad or straight) Home Equipment: Grab bars - toilet, Grab bars - tub/shower, Walker - 2 wheels, Cane - single point, Shower seat  Prior Device Use: Indicate devices/aids used by the patient prior to current illness, exacerbation or injury? None of the above  Prior Functional Level Prior Function Level of Independence: Independent Comments: 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[employer] Care: Did the patient need help bathing, dressing, using the toilet or eating?  Independent  Indoor Mobility: Did the patient need assistance with walking from room to room (with or without device)? Independent  Stairs: Did the patient need assistance with internal or external stairs (with or without device)?  Independent  Functional Cognition: Did the patient need help planning regular tasks such as shopping or remembering to take medications? Independent  Current Functional Level Cognition  Overall Cognitive Status: Impaired/Different from baseline Current Attention Level: Selective Orientation Level: Oriented X4 Following Commands: Follows multi-step commands with increased time, Follows one step commands consistently Safety/Judgement: Decreased awareness of safety General Comments: cognition/processing worsens as she fatigues.  She frequently repeats things and askes repeated questions.      Extremity Assessment (includes Sensation/Coordination)  Upper Extremity Assessment: Generalized weakness  Lower Extremity Assessment: Overall WFL for tasks assessed    ADLs  Overall ADL's : Needs assistance/impaired Eating/Feeding: NPO Grooming: Wash/dry hands, Wash/dry face, Oral care, Brushing hair, Sitting, Minimal assistance Upper Body Bathing: Moderate assistance, Sitting Lower Body Bathing: Maximal assistance, Sit to/from stand Upper Body Dressing : Total assistance, Sitting Lower Body Dressing: Total assistance, Sit to/from stand Toilet Transfer: Minimal assistance, Stand-pivot, BSC, RW Toileting- Clothing Manipulation and Hygiene: Maximal assistance, Sit to/from stand Functional mobility during ADLs: Minimal assistance, Rolling walker    Mobility  Overal bed mobility: Needs Assistance Bed Mobility: Supine to Sit Rolling: Min assist Sidelying to sit: Min assist Supine to sit: Min assist Sit to supine: Max assist, +2 for physical assistance General bed mobility comments: assist to elevate trunk and brings hips toward EOB    Transfers  Overall transfer level: Needs assistance Equipment used: Rolling walker (2 wheeled) Transfer via Lift Equipment: Stedy Transfers: Sit to/from Stand, [employer] Sit to Stand: Min assist Stand pivot transfers: Mod assist General transfer  comment: assist to power up into standing from EOB, BSC, and recliner and assist for balance when pivoting EOB to [facility]; cues for safe hand placement; use of RW for balance when standing for pericare    Ambulation / Gait / Stairs / Wheelchair Mobility  Ambulation/Gait Ambulation/Gait assistance: Min assist, +2 safety/equipment (chair follow) Ambulation Distance (Feet):  (29ft then 55ft ) Assistive device: Rolling walker (2 wheeled) Gait Pattern/deviations: Step-through pattern, Decreased step length - right, Decreased step length - left, Trunk flexed, Narrow base of support General Gait Details: one seated rest break required due to fatigue; multimodal cues for posture and proximity to RW Gait velocity: decreased Gait velocity interpretation: Below normal speed for age/gender    Posture / Balance Dynamic Sitting Balance Sitting balance - Comments: close min guard assist.  Leans to Lt, and dependent on UE support  Balance Overall balance assessment: Needs assistance Sitting-balance support: Feet supported, No upper extremity supported Sitting balance-Leahy Scale: Good Sitting balance - Comments: close min guard assist.  Leans to Lt, and dependent on UE support  Postural control: Left lateral lean Standing balance support: Bilateral upper extremity supported Standing balance-Leahy Scale: Poor Standing balance comment: reliant on UE support     Special needs/care consideration BiPAP/CPAP  N/a CPM  N/a Continuous Drip IV  N/a Dialysis  N/a Life Vest  N/a Oxygen  N/a Special Bed  N/a Trach Size  N/a Wound Vac n/a Skin surgical incision; ecchymosis to abdomen                        Bowel mgmt: incontinence; rectal tube removed; continues with some diarrhea Bladder mgmt: external catheter due to incontinence Diabetic mgmt  N/a   Previous Home Environment Living Arrangements: Alone  Lives With: Alone Available Help at Discharge:  (daughter states cousin, Malachy Mood and friend, Marvell Fuller ) Type of Home: House Home Layout: One level Home Access: Stairs to enter Entrance Stairs-Rails: Right, Left, Can reach both Entrance Stairs-Number of Steps: 6 Bathroom Shower/Tub: Multimedia programmer: Standard Bathroom Accessibility: No Home Care Services: No Additional Comments: daughter from [place]; States pta, pt was to go stay with either Malachy Mood or Lolita Patella to recuperate  Discharge Living Setting Plans for Discharge Living Setting: Patient's home, Alone Type of Home at Discharge: House Discharge Home Layout: One level Discharge Home Access: Stairs to enter Entrance Stairs-Rails: Right, Left, Can reach both Entrance Stairs-Number of Steps: 6 Discharge Bathroom Shower/Tub: Walk-in shower Discharge Bathroom Toilet: Standard Discharge Bathroom Accessibility: Yes How Accessible: Accessible via walker Does the patient have any problems obtaining your medications?: No  Social/Family/Support Systems Patient Roles: Parent Contact Information: Anda Kraft, daughter Anticipated Caregiver: cousin, Malachy Mood or friend, Lolita Patella Anticipated Ambulance person Information: see above Ability/Limitations of Caregiver: none Caregiver Availability: 24/7 Discharge Plan Discussed with Primary Caregiver: Yes Is Caregiver In Agreement with Plan?: Yes Does Caregiver/Family have Issues with Lodging/Transportation while Pt is in Rehab?: No  Goals/Additional Needs Patient/Family Goal for Rehab: min assist with PT, OT, and SLP Expected length of stay: ELOS 22-26 days Pt/Family Agrees to Admission and willing to participate: Yes Program Orientation Provided & Reviewed with Pt/Caregiver Including Roles  & Responsibilities: Yes  Barriers to Discharge: Decreased caregiver support, Insurance for SNF coverage  Barriers to Discharge Comments: daughter feels pt can reach MOd I level to d/c home with prn assist  Decrease burden of Care through IP rehab admission: Diet advancement, Decrease  number of caregivers, Bowel and bladder program and Patient/family education   Possible need for SNF placement upon discharge: I discussed with pt at bedside and with daughter, Anda Kraft, by conference call on 08/19/2017 that insurance unlikely to cover both inpatient acute rehabilitation admission as well as SNF rehab if pt doesn't reach Mod I goals. I discussed that our Rehabilitation MD feels pt will need 24/7 assist at discharge from CIR. Anda Kraft states pt was independent pta, and plans were made for her to go stay with either cousin, Malachy Mood or friend, Lolita Patella at discharge form her surgery prior to her having surgical complications.   Patient Condition: This patient's medical and functional status has changed since the consult dated 08/13/2017 in which the Rehabilitation Physician determined and documented that the patient was potentially appropriate for intensive rehabilitative care in an inpatient rehabilitation facility. Issues have been addressed and update has been discussed with Dr.  Posey Pronto and patient now appropriate for inpatient rehabilitation. Will admit to inpatient rehab today.   Preadmission Screen Completed By:  Cleatrice Burke, 08/20/2017 1:07 PM ______________________________________________________________________   Discussed status with Dr. Posey Pronto on 08/20/2017 at  1307 and received telephone approval for admission today.  Admission Coordinator:  Cleatrice Burke, time 1916 Date 08/20/2017

## 2017-08-19 NOTE — Progress Notes (Addendum)
      [address]       [place] [address]             [phone]      20 Days Post-Op Procedure(s) (LRB): VIDEO ASSISTED THORACOSCOPY (VATS)/WEDGE RESECTION (Right) LOBECTOMY (Right)   Subjective:  Doing much better.  Complains of uncontrollable stool loss.  I explained to the patient that being on tube feeds there is nothing to bulk her stool and diarrhea is unfortunately expected.  Objective: Vital signs in last 24 hours: Temp:  [97.7 F (36.5 C)-98.4 F (36.9 C)] 97.7 F (36.5 C) (10/16 0354) Pulse Rate:  [67-76] 71 (10/16 0354) Cardiac Rhythm: Normal sinus rhythm (10/16 0701) Resp:  [17-22] 20 (10/16 0354) BP: (116-136)/(58-75) 130/75 (10/16 0354) SpO2:  [94 %-100 %] 97 % (10/16 0737) Weight:  [187 lb 9.8 oz (85.1 kg)] 187 lb 9.8 oz (85.1 kg) (10/16 0354)  Intake/Output from previous day: 10/15 0701 - 10/16 0700 In: 1160 [NG/GT:1060; IV Piggyback:100] Out: 500 [Urine:500]  General appearance: alert, cooperative and no distress Heart: regular rate and rhythm Lungs: diminished breath sounds right base Abdomen: soft, non-tender; bowel sounds normal; no masses,  no organomegaly Extremities: extremities normal, atraumatic, no cyanosis or edema Wound: clean and dry  Lab Results:  Recent Labs  08/17/17 0440  WBC 12.0*  HGB 9.8*  HCT 31.9*  PLT 243   BMET:  Recent Labs  08/17/17 0440 08/18/17 0527  NA 148* 147*  K 3.0* 3.5  CL 116* 115*  CO2 26 24  GLUCOSE 152* 130*  BUN 34* 29*  CREATININE 0.83 0.84  CALCIUM 7.9* 8.1*    PT/INR: No results for input(s): LABPROT, INR in the last 72 hours. ABG    Component Value Date/Time   PHART 7.557 (H) 08/07/2017 0350   HCO3 30.0 (H) 08/07/2017 0350   TCO2 27 08/07/2017 1700   ACIDBASEDEF 1.4 08/05/2017 0406   O2SAT 99.0 08/07/2017 0350   CBG (last 3)   Recent Labs  08/18/17 0826 08/18/17 1209 08/18/17 1647  GLUCAP 144* 111* 147*    Assessment/Plan: S/P Procedure(s) (LRB): VIDEO  ASSISTED THORACOSCOPY (VATS)/WEDGE RESECTION (Right) LOBECTOMY (Right)  1. CV- hemodynamically stable- continue Amiodarone, Lopressor, Catapres, Norvasc 2. Pulm- no acute issues, continue IS 3. Dysphagia- currently NPO, for MBS today will advance diet per SLP recommendations, continue tube feeds, unfortunately causing diarrhea which isnt unexpected 4. Deconditioning- awaiting insurance authorization for CIR 5. Dispo- patient stable, for MBS today will watch for further diet recommendations, CIR once bed available with insurance authorization   LOS: 20 days    BARRETT, ERIN 08/19/2017 Patient seen and examined, agree with above Passed swallow! On DII diet Dc feeding tube  Remo Lipps C. Roxan Hockey, MD [facility] [phone]

## 2017-08-19 NOTE — Progress Notes (Signed)
Modified Barium Swallow Progress Note  Patient Details  Name: Kathleen Guerrero MRN: [id number] Date of Birth: 08-05-1946  Today's Date: 08/19/2017  Modified Barium Swallow completed.  Full report located under Chart Review in the Imaging Section.  Brief recommendations include the following:  Clinical Impression  Much improved swallow function compared to prior MBS. Mild oral transit delay/hesitation to increase oral control. Mild motor based pharyngeal dysphagia marked by incomplete epiglottic inversion caused by Cortrak tube leading to mild residue (mod after solid). Sensed aspiration of thin using straw suspect from residue in pyriform sinuses (difficult to fully view). Functional timing and coordination of swallow initiation. Esophageal scan without overt findings upon brief observation. Recommend thin liquids, no straws Dys 2 (ground- pt preference), pills whole in puree, sit upright and small bites/sips. Continue ST intervention.    Swallow Evaluation Recommendations       SLP Diet Recommendations: Dysphagia 2 (Fine chop) solids;Thin liquid   Liquid Administration via: Cup;No straw   Medication Administration: Whole meds with puree   Supervision: Patient able to self feed;Full supervision/cueing for compensatory strategies   Compensations: Slow rate;Small sips/bites   Postural Changes: Seated upright at 90 degrees   Oral Care Recommendations: Oral care BID        Kathleen Guerrero 08/19/2017,11:17 AM   Orbie Pyo Colvin Caroli.Ed [employer] [phone]

## 2017-08-19 NOTE — Progress Notes (Signed)
I met with pt at bedside and spoke with her daughter, Anda Kraft, by conference call with pt. I reviewed insurance coverage for an inpt rehab admit as well as discussed the need for caregiver support upon d/c form an inpt rehab. Anda Kraft states pt has a friend and a cousin that she can stay with at d/c if needed. I provided the letter of coverage as well as discussed rehab admission and goals. I continue to await [facility] insurance approval for a possible inpt rehab admission. Noted pt is medically ready. I discussed with RN CM. [phone]

## 2017-08-20 ENCOUNTER — Inpatient Hospital Stay (HOSPITAL_COMMUNITY)
Admission: RE | Admit: 2017-08-20 | Discharge: 2017-08-28 | DRG: 945 | Disposition: A | Discharge status: Home / Self Care | Payer: Medicare HMO | Source: Intra-hospital | Attending: Physical Medicine & Rehabilitation | Admitting: Physical Medicine & Rehabilitation

## 2017-08-20 DIAGNOSIS — R451 Restlessness and agitation: Secondary | ICD-10-CM | POA: Diagnosis not present

## 2017-08-20 DIAGNOSIS — C3411 Malignant neoplasm of upper lobe, right bronchus or lung: Secondary | ICD-10-CM | POA: Diagnosis present

## 2017-08-20 DIAGNOSIS — I10 Essential (primary) hypertension: Secondary | ICD-10-CM

## 2017-08-20 DIAGNOSIS — E785 Hyperlipidemia, unspecified: Secondary | ICD-10-CM

## 2017-08-20 DIAGNOSIS — Z87891 Personal history of nicotine dependence: Secondary | ICD-10-CM | POA: Diagnosis not present

## 2017-08-20 DIAGNOSIS — E46 Unspecified protein-calorie malnutrition: Secondary | ICD-10-CM | POA: Diagnosis not present

## 2017-08-20 DIAGNOSIS — R0989 Other specified symptoms and signs involving the circulatory and respiratory systems: Secondary | ICD-10-CM | POA: Diagnosis not present

## 2017-08-20 DIAGNOSIS — D72829 Elevated white blood cell count, unspecified: Secondary | ICD-10-CM

## 2017-08-20 DIAGNOSIS — W19XXXA Unspecified fall, initial encounter: Secondary | ICD-10-CM

## 2017-08-20 DIAGNOSIS — Z8673 Personal history of transient ischemic attack (TIA), and cerebral infarction without residual deficits: Secondary | ICD-10-CM

## 2017-08-20 DIAGNOSIS — R531 Weakness: Principal | ICD-10-CM | POA: Diagnosis present

## 2017-08-20 DIAGNOSIS — E870 Hyperosmolality and hypernatremia: Secondary | ICD-10-CM | POA: Diagnosis not present

## 2017-08-20 DIAGNOSIS — Z79899 Other long term (current) drug therapy: Secondary | ICD-10-CM

## 2017-08-20 DIAGNOSIS — Z7982 Long term (current) use of aspirin: Secondary | ICD-10-CM | POA: Diagnosis not present

## 2017-08-20 DIAGNOSIS — R159 Full incontinence of feces: Secondary | ICD-10-CM | POA: Diagnosis not present

## 2017-08-20 DIAGNOSIS — Z720 Tobacco use: Secondary | ICD-10-CM

## 2017-08-20 DIAGNOSIS — R5381 Other malaise: Secondary | ICD-10-CM | POA: Diagnosis not present

## 2017-08-20 DIAGNOSIS — E8809 Other disorders of plasma-protein metabolism, not elsewhere classified: Secondary | ICD-10-CM

## 2017-08-20 DIAGNOSIS — F329 Major depressive disorder, single episode, unspecified: Secondary | ICD-10-CM

## 2017-08-20 DIAGNOSIS — F32A Depression, unspecified: Secondary | ICD-10-CM

## 2017-08-20 DIAGNOSIS — K219 Gastro-esophageal reflux disease without esophagitis: Secondary | ICD-10-CM | POA: Diagnosis present

## 2017-08-20 DIAGNOSIS — N390 Urinary tract infection, site not specified: Secondary | ICD-10-CM

## 2017-08-20 DIAGNOSIS — R1310 Dysphagia, unspecified: Secondary | ICD-10-CM

## 2017-08-20 DIAGNOSIS — D62 Acute posthemorrhagic anemia: Secondary | ICD-10-CM | POA: Diagnosis not present

## 2017-08-20 DIAGNOSIS — Z8709 Personal history of other diseases of the respiratory system: Secondary | ICD-10-CM

## 2017-08-20 DIAGNOSIS — R350 Frequency of micturition: Secondary | ICD-10-CM | POA: Diagnosis not present

## 2017-08-20 DIAGNOSIS — F3341 Major depressive disorder, recurrent, in partial remission: Secondary | ICD-10-CM

## 2017-08-20 DIAGNOSIS — G7281 Critical illness myopathy: Secondary | ICD-10-CM | POA: Diagnosis present

## 2017-08-20 DIAGNOSIS — I480 Paroxysmal atrial fibrillation: Secondary | ICD-10-CM | POA: Diagnosis not present

## 2017-08-20 DIAGNOSIS — Z9889 Other specified postprocedural states: Secondary | ICD-10-CM | POA: Diagnosis not present

## 2017-08-20 DIAGNOSIS — F419 Anxiety disorder, unspecified: Secondary | ICD-10-CM

## 2017-08-20 DIAGNOSIS — E871 Hypo-osmolality and hyponatremia: Secondary | ICD-10-CM | POA: Diagnosis not present

## 2017-08-20 DIAGNOSIS — F411 Generalized anxiety disorder: Secondary | ICD-10-CM | POA: Diagnosis not present

## 2017-08-20 DIAGNOSIS — Z902 Acquired absence of lung [part of]: Secondary | ICD-10-CM

## 2017-08-20 DIAGNOSIS — C801 Malignant (primary) neoplasm, unspecified: Secondary | ICD-10-CM | POA: Diagnosis not present

## 2017-08-20 DIAGNOSIS — F4310 Post-traumatic stress disorder, unspecified: Secondary | ICD-10-CM | POA: Diagnosis present

## 2017-08-20 DIAGNOSIS — R69 Illness, unspecified: Secondary | ICD-10-CM | POA: Diagnosis not present

## 2017-08-20 LAB — CUP PACEART REMOTE DEVICE CHECK
MDC IDC PG IMPLANT DT: 20170919
MDC IDC SESS DTM: 20181015020943

## 2017-08-20 LAB — CBC WITH DIFFERENTIAL/PLATELET
BASOS ABS: 0 10*3/uL (ref 0.0–0.1)
BASOS PCT: 0 %
EOS ABS: 0.2 10*3/uL (ref 0.0–0.7)
EOS PCT: 3 %
HCT: 28.4 % — ABNORMAL LOW (ref 36.0–46.0)
Hemoglobin: 9 g/dL — ABNORMAL LOW (ref 12.0–15.0)
Lymphocytes Relative: 27 %
Lymphs Abs: 2 10*3/uL (ref 0.7–4.0)
MCH: 28.3 pg (ref 26.0–34.0)
MCHC: 31.7 g/dL (ref 30.0–36.0)
MCV: 89.3 fL (ref 78.0–100.0)
MONO ABS: 1 10*3/uL (ref 0.1–1.0)
Monocytes Relative: 14 %
NEUTROS ABS: 4.1 10*3/uL (ref 1.7–7.7)
Neutrophils Relative %: 56 %
PLATELETS: 216 10*3/uL (ref 150–400)
RBC: 3.18 MIL/uL — ABNORMAL LOW (ref 3.87–5.11)
RDW: 15.5 % (ref 11.5–15.5)
WBC: 7.3 10*3/uL (ref 4.0–10.5)

## 2017-08-20 LAB — COMPREHENSIVE METABOLIC PANEL
ALBUMIN: 2.2 g/dL — AB (ref 3.5–5.0)
ALT: 59 U/L — ABNORMAL HIGH (ref 14–54)
ANION GAP: 7 (ref 5–15)
AST: 25 U/L (ref 15–41)
Alkaline Phosphatase: 59 U/L (ref 38–126)
BUN: 16 mg/dL (ref 6–20)
CHLORIDE: 104 mmol/L (ref 101–111)
CO2: 23 mmol/L (ref 22–32)
Calcium: 7.8 mg/dL — ABNORMAL LOW (ref 8.9–10.3)
Creatinine, Ser: 0.97 mg/dL (ref 0.44–1.00)
GFR calc Af Amer: >60 mL/min (ref >60)
GFR calc non Af Amer: 58 mL/min — ABNORMAL LOW (ref >60)
GLUCOSE: 117 mg/dL — AB (ref 65–99)
POTASSIUM: 3.6 mmol/L (ref 3.5–5.1)
Sodium: 134 mmol/L — ABNORMAL LOW (ref 135–145)
Total Bilirubin: 0.5 mg/dL (ref 0.3–1.2)
Total Protein: 5.2 g/dL — ABNORMAL LOW (ref 6.5–8.1)

## 2017-08-20 LAB — GLUCOSE, CAPILLARY
GLUCOSE-CAPILLARY: 101 mg/dL — AB (ref 65–99)
GLUCOSE-CAPILLARY: 133 mg/dL — AB (ref 65–99)
GLUCOSE-CAPILLARY: 94 mg/dL (ref 65–99)
GLUCOSE-CAPILLARY: 99 mg/dL (ref 65–99)
Glucose-Capillary: 165 mg/dL — ABNORMAL HIGH (ref 65–99)

## 2017-08-20 MED ORDER — DOUBLE ANTIBIOTIC 500-10000 UNIT/GM EX OINT
1.0000 "application " | TOPICAL_OINTMENT | Freq: Two times a day (BID) | CUTANEOUS | Status: DC
  Filled 2017-08-20 (×16): qty 1

## 2017-08-20 MED ORDER — METOPROLOL TARTRATE 12.5 MG HALF TABLET
12.5000 mg | ORAL_TABLET | Freq: Two times a day (BID) | ORAL | Status: DC
  Administered 2017-08-20 – 2017-08-28 (×16): 12.5 mg via ORAL
  Filled 2017-08-20 (×16): qty 1

## 2017-08-20 MED ORDER — AMIODARONE HCL 200 MG PO TABS: 200.0000 mg | ORAL_TABLET | Freq: Every day | ORAL | Status: DC

## 2017-08-20 MED ORDER — DOUBLE ANTIBIOTIC 500-10000 UNIT/GM EX OINT
1.0000 "application " | TOPICAL_OINTMENT | Freq: Two times a day (BID) | CUTANEOUS | Status: DC
  Administered 2017-08-20: 1 via TOPICAL
  Filled 2017-08-20 (×2): qty 28.4

## 2017-08-20 MED ORDER — PANTOPRAZOLE SODIUM 40 MG PO TBEC
40.0000 mg | DELAYED_RELEASE_TABLET | Freq: Two times a day (BID) | ORAL | Status: DC
  Administered 2017-08-20 – 2017-08-28 (×16): 40 mg via ORAL
  Filled 2017-08-20 (×16): qty 1

## 2017-08-20 MED ORDER — CLONAZEPAM 0.5 MG PO TABS: 0.5000 mg | ORAL_TABLET | Freq: Two times a day (BID) | ORAL | 0 refills | Status: DC | PRN

## 2017-08-20 MED ORDER — CLONIDINE HCL 0.1 MG PO TABS
0.1000 mg | ORAL_TABLET | Freq: Two times a day (BID) | ORAL | Status: DC
  Administered 2017-08-20 – 2017-08-28 (×16): 0.1 mg via ORAL
  Filled 2017-08-20 (×16): qty 1

## 2017-08-20 MED ORDER — K PHOS MONO-SOD PHOS DI & MONO 155-852-130 MG PO TABS: 500.0000 mg | ORAL_TABLET | Freq: Three times a day (TID) | ORAL | Status: DC

## 2017-08-20 MED ORDER — ATORVASTATIN CALCIUM 40 MG PO TABS
40.0000 mg | ORAL_TABLET | Freq: Every day | ORAL | Status: DC
  Administered 2017-08-21 – 2017-08-27 (×7): 40 mg via ORAL
  Filled 2017-08-20 (×7): qty 1

## 2017-08-20 MED ORDER — POTASSIUM CHLORIDE 20 MEQ/15ML (10%) PO SOLN
40.0000 meq | Freq: Two times a day (BID) | ORAL | Status: DC
  Administered 2017-08-20 – 2017-08-22 (×4): 40 meq via ORAL
  Filled 2017-08-20 (×6): qty 30

## 2017-08-20 MED ORDER — ENOXAPARIN SODIUM 40 MG/0.4ML ~~LOC~~ SOLN
40.0000 mg | SUBCUTANEOUS | Status: DC
  Administered 2017-08-21 – 2017-08-28 (×8): 40 mg via SUBCUTANEOUS
  Filled 2017-08-20 (×9): qty 0.4

## 2017-08-20 MED ORDER — LEVALBUTEROL HCL 0.63 MG/3ML IN NEBU
0.6300 mg | INHALATION_SOLUTION | Freq: Two times a day (BID) | RESPIRATORY_TRACT | Status: DC
  Administered 2017-08-21 – 2017-08-22 (×3): 0.63 mg via RESPIRATORY_TRACT
  Filled 2017-08-20 (×4): qty 3

## 2017-08-20 MED ORDER — AMLODIPINE BESYLATE 10 MG PO TABS
10.0000 mg | ORAL_TABLET | Freq: Every day | ORAL | Status: DC
  Administered 2017-08-21 – 2017-08-28 (×8): 10 mg via ORAL
  Filled 2017-08-20 (×8): qty 1

## 2017-08-20 MED ORDER — GERHARDT'S BUTT CREAM
1.0000 | TOPICAL_CREAM | Freq: Four times a day (QID) | CUTANEOUS | Status: DC | PRN
Start: 2017-08-20 — End: 2017-08-28

## 2017-08-20 MED ORDER — METOPROLOL TARTRATE 25 MG PO TABS: 12.5000 mg | ORAL_TABLET | Freq: Two times a day (BID) | ORAL | Status: DC

## 2017-08-20 MED ORDER — CALCIUM CARBONATE ANTACID 500 MG PO CHEW: 1.0000 | CHEWABLE_TABLET | Freq: Four times a day (QID) | ORAL | Status: DC | PRN

## 2017-08-20 MED ORDER — SENNOSIDES-DOCUSATE SODIUM 8.6-50 MG PO TABS: 1.0000 | ORAL_TABLET | Freq: Every day | ORAL | Status: DC

## 2017-08-20 MED ORDER — BISACODYL 5 MG PO TBEC
10.0000 mg | DELAYED_RELEASE_TABLET | Freq: Every day | ORAL | Status: DC
  Administered 2017-08-22 – 2017-08-27 (×3): 10 mg via ORAL
  Filled 2017-08-20 (×8): qty 2

## 2017-08-20 MED ORDER — POTASSIUM CHLORIDE 20 MEQ/15ML (10%) PO SOLN: 40.0000 meq | Freq: Two times a day (BID) | ORAL | 0 refills | Status: DC

## 2017-08-20 MED ORDER — GERHARDT'S BUTT CREAM
TOPICAL_CREAM | Freq: Four times a day (QID) | CUTANEOUS | Status: DC | PRN
  Administered 2017-08-26 – 2017-08-27 (×2): via TOPICAL
  Filled 2017-08-20: qty 1

## 2017-08-20 MED ORDER — PANTOPRAZOLE SODIUM 40 MG PO TBEC: 40.0000 mg | DELAYED_RELEASE_TABLET | Freq: Two times a day (BID) | ORAL | Status: DC

## 2017-08-20 MED ORDER — AMIODARONE HCL 200 MG PO TABS
200.0000 mg | ORAL_TABLET | Freq: Every day | ORAL | Status: DC
  Administered 2017-08-21 – 2017-08-28 (×8): 200 mg via ORAL
  Filled 2017-08-20 (×8): qty 1

## 2017-08-20 MED ORDER — K PHOS MONO-SOD PHOS DI & MONO 155-852-130 MG PO TABS
500.0000 mg | ORAL_TABLET | Freq: Three times a day (TID) | ORAL | Status: DC
  Administered 2017-08-20 – 2017-08-28 (×30): 500 mg via ORAL
  Filled 2017-08-20 (×32): qty 2

## 2017-08-20 MED ORDER — CLONIDINE HCL 0.1 MG PO TABS: 0.1000 mg | ORAL_TABLET | Freq: Two times a day (BID) | ORAL | 11 refills | Status: DC

## 2017-08-20 MED ORDER — SORBITOL 70 % SOLN: 30.0000 mL | Freq: Every day | Status: DC | PRN

## 2017-08-20 MED ORDER — LEVALBUTEROL HCL 0.63 MG/3ML IN NEBU
0.6300 mg | INHALATION_SOLUTION | Freq: Two times a day (BID) | RESPIRATORY_TRACT | Status: DC
  Administered 2017-08-20: 0.63 mg via RESPIRATORY_TRACT
  Filled 2017-08-20: qty 3

## 2017-08-20 MED ORDER — DOUBLE ANTIBIOTIC 500-10000 UNIT/GM EX OINT: 1.0000 "application " | TOPICAL_OINTMENT | Freq: Two times a day (BID) | CUTANEOUS | Status: DC

## 2017-08-20 MED ORDER — DOUBLE ANTIBIOTIC 500-10000 UNIT/GM EX OINT
1.0000 "application " | TOPICAL_OINTMENT | Freq: Two times a day (BID) | CUTANEOUS | Status: DC
  Administered 2017-08-21 – 2017-08-28 (×15): 1 via TOPICAL
  Filled 2017-08-20 (×14): qty 1

## 2017-08-20 MED ORDER — DOUBLE ANTIBIOTIC 500-10000 UNIT/GM EX OINT
TOPICAL_OINTMENT | Freq: Two times a day (BID) | CUTANEOUS | Status: DC
  Filled 2017-08-20 (×16): qty 1

## 2017-08-20 MED ORDER — ENOXAPARIN SODIUM 40 MG/0.4ML ~~LOC~~ SOLN: 40.0000 mg | SUBCUTANEOUS | Status: DC

## 2017-08-20 MED ORDER — CLONAZEPAM 0.5 MG PO TABS
0.5000 mg | ORAL_TABLET | Freq: Two times a day (BID) | ORAL | Status: DC | PRN
  Administered 2017-08-20 – 2017-08-24 (×5): 0.5 mg via ORAL
  Filled 2017-08-20 (×5): qty 1

## 2017-08-20 NOTE — Progress Notes (Addendum)
      [address]       [place] [address]             [phone]      21 Days Post-Op Procedure(s) (LRB): VIDEO ASSISTED THORACOSCOPY (VATS)/WEDGE RESECTION (Right) LOBECTOMY (Right)   Subjective:  Kathleen Guerrero has no new complaints.  Very happy NG tube has been removed, tolerating diet without difficulty  Objective: Vital signs in last 24 hours: Temp:  [98 F (36.7 C)-99.8 F (37.7 C)] 98.5 F (36.9 C) (10/17 0330) Pulse Rate:  [73-79] 79 (10/16 2105) Cardiac Rhythm: Normal sinus rhythm (10/17 0700) Resp:  [16-21] 21 (10/16 1554) BP: (108-130)/(59-66) 108/66 (10/17 0330) SpO2:  [96 %-100 %] 100 % (10/16 2006) Weight:  [178 lb 9.2 oz (81 kg)] 178 lb 9.2 oz (81 kg) (10/17 0330)  Intake/Output from previous day: 10/16 0701 - 10/17 0700 In: 660 [P.O.:260; NG/GT:400] Out: -   General appearance: alert, cooperative and no distress Heart: regular rate and rhythm Lungs: clear to auscultation bilaterally Abdomen: soft, non-tender; bowel sounds normal; no masses,  no organomegaly Extremities: extremities normal, atraumatic, no cyanosis or edema Wound: clean, dehiscence of skin edges, no evidence of infection  Lab Results: No results for input(s): WBC, HGB, HCT, PLT in the last 72 hours. BMET:  Recent Labs  08/18/17 0527  NA 147*  K 3.5  CL 115*  CO2 24  GLUCOSE 130*  BUN 29*  CREATININE 0.84  CALCIUM 8.1*    PT/INR: No results for input(s): LABPROT, INR in the last 72 hours. ABG    Component Value Date/Time   PHART 7.557 (H) 08/07/2017 0350   HCO3 30.0 (H) 08/07/2017 0350   TCO2 27 08/07/2017 1700   ACIDBASEDEF 1.4 08/05/2017 0406   O2SAT 99.0 08/07/2017 0350   CBG (last 3)   Recent Labs  08/19/17 2042 08/20/17 0025 08/20/17 0524  GLUCAP 112* 101* 99    Assessment/Plan: S/P Procedure(s) (LRB): VIDEO ASSISTED THORACOSCOPY (VATS)/WEDGE RESECTION (Right) LOBECTOMY (Right)  1. CV- hemodynamically stable- continue Amiodarone,  Lopressor, Catapres, Norvasc 2. Pulm- no acute issues, continue IS 3. Dysphagia- improved, on thin liquids, Dysphagia 2 diet-- tolerating without difficulty 4. Incision- no evidence on infection, skin edges are dehisced... Diligent wound care and keep wound dry 5. Deconditioning- awaiting insurance authorization, to CIR once approved 6. dispo- patient stable, tolerating diet, diligent wound care, to CIR hopefully today if insurance authorization gets approved   LOS: 21 days    BARRETT, ERIN 08/20/2017 Patient seen and examined, agree with above Has a slight dehiscence of the skin edges of wound Ready for rehab when insurance issues resolved  Remo Lipps C. Roxan Hockey, MD [facility] [phone]

## 2017-08-20 NOTE — Progress Notes (Signed)
Charlett Blake, MD Physician Signed Physical Medicine and Rehabilitation  Consult Note Date of Service: 08/13/2017 8:58 AM  Related encounter: Admission (Current) from 07/30/2017 in [facility] 4E CV SURGICAL PROGRESSIVE CARE     Expand All Collapse All   [] Hide copied text [] Hover for attribution information      Physical Medicine and Rehabilitation Consult Reason for Consult: Decreased functional mobility Referring Physician: Dr. Roxan Hockey   HPI: Kathleen Guerrero is a 71 y.o.right handed female with past medical history of hypertension, hepatitis, tobacco abuse and CVA status post loop recorder September 2017. Per chart review patient lives alone independently prior to admission and still working. One level home 6 steps to entry. As part of patient's workup from CVA 2017 she had a CT of her neck that showed right upper lobe groundglass opacity. CT the chest showed a 13 x 9 x 8 mm opacity and follow-up recommended. She was seen outpatient by Dr. Melvyn Novas with follow-up CTs November in March showing no significant change. Recently another follow-up scan showed increase in size to 13 x 20 mm.She was admitted 913 for ongoing evaluation and care with right upper lobe wedge resection biopsy adenocarcinoma. Underwent VATS, thoracic right upper lobectomy, lymph node dissection 07/30/2017 per Dr. Roxan Hockey. Hospital course pain management. On 08/01/2017 patient developed acute respiratory distress accompanied by crepitus over her right chest wall she had been intubated. Chest x-ray showed extensive soft tissue emphysema throughout the right chest walks stinging to the lower neck partially obscures underlying structures/pneumothorax. A chest tube was later placed 08/03/2017. Patient later extubated 08/11/2017. Hospital course acute blood loss anemia and monitored. Subcutaneous Lovenox added for DVT prophylaxis. Physical therapy evaluation completed 08/12/2017 with recommendations of physical medicine  rehabilitation consult.  Voice is hypophonic difficult to obtain history. Daughter from [place] is at the bedside. Discussed with critical care, chest tube should be coming out today Discussed with ICU nursing, has rectal tube  Review of Systems  Constitutional: Negative for chills and fever.  HENT: Negative for hearing loss.   Eyes: Negative for blurred vision and double vision.  Respiratory: Positive for cough and shortness of breath.   Cardiovascular: Positive for leg swelling. Negative for chest pain and palpitations.  Gastrointestinal: Positive for constipation.       GERD  Genitourinary: Negative for dysuria, flank pain and hematuria.  Musculoskeletal: Positive for myalgias.  Skin: Negative for rash.  Neurological: Positive for headaches. Negative for seizures.  All other systems reviewed and are negative.      Past Medical History:  Diagnosis Date  . Arthritis   . GERD (gastroesophageal reflux disease)    tums  . Headache   . Hepatitis    cmv 30 yrs ago hepatitis  . History of hiatal hernia   . History of loop recorder    after stroke  . Hypertension   . Stroke ([place]) 07/2016   balance a little off  . Tobacco use         Past Surgical History:  Procedure Laterality Date  . APPENDECTOMY    . DERMOID CYST  EXCISION     x2 1'  . EP IMPLANTABLE DEVICE N/A 07/23/2016   Procedure: Loop Recorder Insertion;  Surgeon: Thompson Grayer, MD;  Location: [place] CV LAB;  Service: Cardiovascular;  Laterality: N/A;  . Intestinal obstruction  1979  . LOBECTOMY Right 07/30/2017   Procedure: LOBECTOMY;  Surgeon: [name], MD;  Location: [place];  Service: Thoracic;  Laterality: Right;  . TEE WITHOUT  CARDIOVERSION N/A 07/23/2016   Procedure: TRANSESOPHAGEAL ECHOCARDIOGRAM (TEE);  Surgeon: Lelon Perla, MD;  Location: [facility] ENDOSCOPY;  Service: Cardiovascular;  Laterality: N/A;  . VIDEO ASSISTED THORACOSCOPY (VATS)/WEDGE RESECTION Right 07/30/2017    Procedure: VIDEO ASSISTED THORACOSCOPY (VATS)/WEDGE RESECTION;  Surgeon: [name], MD;  Location: [facility] OR;  Service: Thoracic;  Laterality: Right;        Family History  Problem Relation Age of Onset  . Kidney disease Maternal Grandmother   . Cancer Maternal Grandfather    Social History:  reports that she quit smoking about 12 months ago. Her smoking use included Cigarettes. She has a 50.00 pack-year smoking history. She has never used smokeless tobacco. She reports that she drinks alcohol. She reports that she does not use drugs. Allergies: No Known Allergies       Medications Prior to Admission  Medication Sig Dispense Refill  . amLODipine (NORVASC) 10 MG tablet TAKE 1 TABLET BY MOUTH EVERY DAY (Patient taking differently: TAKE 10 MG BY MOUTH EVERY DAY) 90 tablet 0  . aspirin 325 MG tablet TAKE 1 TABLET BY MOUTH EVERY DAY 30 tablet 11  . atorvastatin (LIPITOR) 40 MG tablet TAKE 1 TABLET BY MOUTH EVERY DAY AT 6 p.m. 90 tablet 0  . calcium carbonate (TUMS - DOSED IN MG ELEMENTAL CALCIUM) 500 MG chewable tablet Chew 1 tablet by mouth 4 (four) times daily as needed for indigestion or heartburn.    . diphenhydrAMINE (BENADRYL) 25 MG tablet Take 50 mg by mouth at bedtime.     . metoprolol (LOPRESSOR) 50 MG tablet Take 1 tablet (50 mg total) by mouth 2 (two) times daily. 60 tablet 11  . nicotine polacrilex ([employer] NICOTINE) 2 MG lozenge Take 1 mg by mouth as needed for smoking cessation (cuts lozenges in half).     Marland Kitchen acetaminophen (TYLENOL) 325 MG tablet Take 650 mg by mouth daily as needed for mild pain or headache.      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 Assist for trunk control and bil LE elevation for return to bed Transfers Overall transfer level: Needs assistance Transfers: Sit to/from Stand Sit to Stand: Max assist, +2 physical assistance, From elevated surface General transfer comment: max assist with bil knees blocked and bil UE support to provide anterior translation and assist with rise from surface. Pt stood x 2 trials of grossly 30 sec each with maintained flexed posture despite cues and assist for hip extension. Unable to transfer OOB as chest tube to be pulled  ADL:  Cognition: Cognition Overall Cognitive Status: Impaired/Different from baseline Orientation  Level: Oriented X4, Disoriented to place, Disoriented to time, Disoriented to situation Cognition Arousal/Alertness: Awake/alert Behavior During Therapy: WFL for tasks assessed/performed Overall Cognitive Status: Impaired/Different from baseline Area of Impairment: Orientation, Memory, Following commands, Safety/judgement Orientation Level: Disoriented to, Time, Place Memory: Decreased short-term memory Following Commands: Follows one step commands consistently, Follows one step commands with increased time Safety/Judgement: Decreased awareness of  deficits General Comments: Pt stating she is in [place]. Pt very soft spoken with increased time for all commands  Blood pressure (!) 152/76, pulse 81, temperature 98 F (36.7 C), temperature source Oral, resp. rate 16, height 5' 2.75" (1.594 m), weight 83.4 kg (183 lb 13.8 oz), SpO2 100 %. Physical Exam  Vitals reviewed. Constitutional: She is oriented to person, place, and time.  HENT:  Nasogastric tube in place  Eyes:  Pupils reactive to light  Neck: Normal range of motion. Neck supple. No thyromegaly present.  Cardiovascular: Normal rate and regular rhythm.   Respiratory:  Decreased breath sounds at the bases. Chest tube in place  GI: Soft. Bowel sounds are normal. She exhibits no distension.  Neurological: She is alert and oriented to person, place, and time.  Follow simple commands. Her voice is but a whisper.  Skin: Skin is warm and dry.  Motor strength is 3 minus bilateral deltoid, 4 minus bilateral biceps, triceps, finger flexors, extensors, 2 minus bilateral hip flexors, knee extensors, ankle dorsiflexors and plantar flexors. Sensation difficult to assess secondary to poor attention and level of alertness. She does withdraw to pinch. She is able to identify light touch on the right hand and both lower limbs but not in the left hand. There is evidence of resting tremor, right hand.  Lab Results Last 24 Hours       Results for orders placed or performed during the hospital encounter of 07/30/17 (from the past 24 hour(s))  Glucose, capillary     Status: Abnormal   Collection Time: 08/12/17 11:39 AM  Result Value Ref Range   Glucose-Capillary 149 (H) 65 - 99 mg/dL   Comment 1 Notify RN   Glucose, capillary     Status: Abnormal   Collection Time: 08/12/17  4:41 PM  Result Value Ref Range   Glucose-Capillary 159 (H) 65 - 99 mg/dL   Comment 1 Notify RN   Glucose, capillary     Status: Abnormal   Collection Time: 08/12/17  8:28 PM  Result Value Ref Range    Glucose-Capillary 140 (H) 65 - 99 mg/dL   Comment 1 Capillary Specimen    Comment 2 Notify RN   Glucose, capillary     Status: Abnormal   Collection Time: 08/13/17 12:23 AM  Result Value Ref Range   Glucose-Capillary 133 (H) 65 - 99 mg/dL   Comment 1 Capillary Specimen    Comment 2 Notify RN   Glucose, capillary     Status: Abnormal   Collection Time: 08/13/17  4:07 AM  Result Value Ref Range   Glucose-Capillary 170 (H) 65 - 99 mg/dL   Comment 1 Capillary Specimen    Comment 2 Notify RN   CBC     Status: Abnormal   Collection Time: 08/13/17  4:22 AM  Result Value Ref Range   WBC 11.1 (H) 4.0 - 10.5 K/uL   RBC 3.44 (L) 3.87 - 5.11 MIL/uL   Hemoglobin 9.7 (L) 12.0 - 15.0 g/dL   HCT 32.2 (L) 36.0 - 46.0 %   MCV 93.6 78.0 - 100.0 fL   MCH 28.2 26.0 -  34.0 pg   MCHC 30.1 30.0 - 36.0 g/dL   RDW 15.7 (H) 11.5 - 15.5 %   Platelets 275 150 - 400 K/uL  Basic metabolic panel     Status: Abnormal   Collection Time: 08/13/17  4:22 AM  Result Value Ref Range   Sodium 155 (H) 135 - 145 mmol/L   Potassium 4.0 3.5 - 5.1 mmol/L   Chloride 117 (H) 101 - 111 mmol/L   CO2 28 22 - 32 mmol/L   Glucose, Bld 166 (H) 65 - 99 mg/dL   BUN 52 (H) 6 - 20 mg/dL   Creatinine, Ser 1.06 (H) 0.44 - 1.00 mg/dL   Calcium 8.3 (L) 8.9 - 10.3 mg/dL   GFR calc non Af Amer 52 (L) >60 mL/min   GFR calc Af Amer >60 >60 mL/min   Anion gap 10 5 - 15  Magnesium     Status: None   Collection Time: 08/13/17  4:22 AM  Result Value Ref Range   Magnesium 2.1 1.7 - 2.4 mg/dL  Phosphorus     Status: None   Collection Time: 08/13/17  4:22 AM  Result Value Ref Range   Phosphorus 4.0 2.5 - 4.6 mg/dL      Imaging Results (Last 48 hours)  Dg Chest Port 1 View  Result Date: 08/13/2017 CLINICAL DATA:  Chest tube and status post lobectomy of lung. EXAM: PORTABLE CHEST 1 VIEW COMPARISON:  08/12/2017 FINDINGS: One right chest tube has been removed. There is still one right chest  tube remaining. Surgical changes are noted in the right lung. No significant pneumothorax. Stable appearance of the heart and mediastinal tissues. Prominent soft tissue along the right side of the mediastinum is unchanged. Left arm PICC line tip is near the superior cavoatrial junction. Patchy densities at left lung base. Feeding tube extends into the abdomen. Again noted is a cardiac loop recorder. IMPRESSION: Removal of one right chest tube.  Negative for pneumothorax. Small patchy densities in the left lower lung are nonspecific but could represent atelectasis. Support apparatuses as described. Electronically Signed   By: Markus Daft M.D.   On: 08/13/2017 08:02   Dg Chest Portable 1 View  Result Date: 08/12/2017 CLINICAL DATA:  Extubated.  Respiratory failure. EXAM: PORTABLE CHEST 1 VIEW COMPARISON:  1 day prior FINDINGS: Loop recorder projecting over the left sided chest. Feeding tube extends beyond the inferior aspect of the film. Extubation. Normal heart size. Right hemidiaphragm elevation. Surgical changes about the right perihilar region. 2 right chest tubes are unchanged in position. No residual pneumothorax identified. No pleural fluid. Persistent right sided volume loss and presumed postoperative atelectasis. Clear left lung. IMPRESSION: Right chest tubes in place ; no convincing evidence of residual pneumothorax. Right-sided postoperative volume loss and atelectasis. Extubation. Electronically Signed   By: Abigail Miyamoto M.D.   On: 08/12/2017 07:20   Dg Swallowing Func-speech Pathology  Result Date: 08/12/2017 Objective Swallowing Evaluation: Type of Study: MBS-Modified Barium Swallow Study Patient Details Name: SATCHA STORLIE MRN: [id number] Date of Birth: [birth year] Today's Date: 08/12/2017 Time: SLP Start Time (ACUTE ONLY): 1046-SLP Stop Time (ACUTE ONLY): 1101 SLP Time Calculation (min) (ACUTE ONLY): 15 min Past Medical History: Past Medical History: Diagnosis Date . Arthritis  . GERD  (gastroesophageal reflux disease)   tums . Headache  . Hepatitis   cmv 30 yrs ago hepatitis . History of hiatal hernia  . History of loop recorder   after stroke . Hypertension  . Stroke [facility]) 07/2016  balance a little off . Tobacco use  Past Surgical History: Past Surgical History: Procedure Laterality Date . APPENDECTOMY   . DERMOID CYST  EXCISION    x2 62' . EP IMPLANTABLE DEVICE N/A 07/23/2016  Procedure: Loop Recorder Insertion;  Surgeon: Thompson Grayer, MD;  Location: [place] CV LAB;  Service: Cardiovascular;  Laterality: N/A; . Intestinal obstruction  1979 . LOBECTOMY Right 07/30/2017  Procedure: LOBECTOMY;  Surgeon: [name], MD;  Location: [place];  Service: Thoracic;  Laterality: Right; . TEE WITHOUT CARDIOVERSION N/A 07/23/2016  Procedure: TRANSESOPHAGEAL ECHOCARDIOGRAM (TEE);  Surgeon: Lelon Perla, MD;  Location: [facility] ENDOSCOPY;  Service: Cardiovascular;  Laterality: N/A; . VIDEO ASSISTED THORACOSCOPY (VATS)/WEDGE RESECTION Right 07/30/2017  Procedure: VIDEO ASSISTED THORACOSCOPY (VATS)/WEDGE RESECTION;  Surgeon: [name], MD;  Location: [facility] OR;  Service: Thoracic;  Laterality: Right; HPI: Pt with planned thoracoscopy wedge resection for RUL nodule 07/30/17. Required intubation 9/28 due to acute hypoxic respiratory failure. Pt's ETT was not maintaining cuff pressure and she required emergent anesthesia for tube change. Extubated 10/8 and pt pulled Cortrak tube. CXR Right chest tubes in place ; no convincing evidence of residual pneumothorax. Right-sided postoperative volume loss and atelectasis. PMH: CVA 2017, GERD, HTN. Objective assessment recommended after BSE to fully assess swallow function (SLP rec'd FEES however pt preferred MBS). No Data Recorded Assessment / Plan / Recommendation CHL IP CLINICAL IMPRESSIONS 08/12/2017 Clinical Impression Pt exhibits moderate-severe oropharyngeal dysphagia with sensorimotor based impairments. Diminished oral cohesion, delayed transit and  lingual residue describe oral phase. Honey thick barium entered pt's larygneal vestibule prior to hyoid elevation initiated. Reduced hyolaryngeal excursion led to decreased epiglottic inversion also appeared to be impeded by NG tube. Pilar Plate amount of honey thick barium aspirated during the swallow without sensation. Mod-max vallecular residue incompletely cleared with cued second swallow. No po's recommended at present. Continue alternate means with oral care and continued ST intervention for facilitation toward safe po intake.  SLP Visit Diagnosis Dysphagia, pharyngoesophageal phase (R13.14) Attention and concentration deficit following -- Frontal lobe and executive function deficit following -- Impact on safety and function Severe aspiration risk   CHL IP TREATMENT RECOMMENDATION 08/12/2017 Treatment Recommendations Therapy as outlined in treatment plan below   No flowsheet data found. CHL IP DIET RECOMMENDATION 08/12/2017 SLP Diet Recommendations NPO Liquid Administration via -- Medication Administration Via alternative means Compensations -- Postural Changes --   CHL IP OTHER RECOMMENDATIONS 08/12/2017 Recommended Consults -- Oral Care Recommendations Oral care QID Other Recommendations --   CHL IP FOLLOW UP RECOMMENDATIONS 08/12/2017 Follow up Recommendations (No Data)   CHL IP FREQUENCY AND DURATION 08/12/2017 Speech Therapy Frequency (ACUTE ONLY) min 2x/week Treatment Duration 2 weeks      CHL IP ORAL PHASE 08/12/2017 Oral Phase Impaired Oral - Pudding Teaspoon -- Oral - Pudding Cup -- Oral - Honey Teaspoon Decreased bolus cohesion;Delayed oral transit;Lingual/palatal residue Oral - Honey Cup NT Oral - Nectar Teaspoon -- Oral - Nectar Cup -- Oral - Nectar Straw -- Oral - Thin Teaspoon -- Oral - Thin Cup -- Oral - Thin Straw -- Oral - Puree Decreased bolus cohesion;Lingual/palatal residue;Delayed oral transit Oral - Mech Soft -- Oral - Regular -- Oral - Multi-Consistency -- Oral - Pill -- Oral Phase - Comment --  CHL  IP PHARYNGEAL PHASE 08/12/2017 Pharyngeal Phase Impaired Pharyngeal- Pudding Teaspoon -- Pharyngeal -- Pharyngeal- Pudding Cup -- Pharyngeal -- Pharyngeal- Honey Teaspoon Penetration/Aspiration before swallow;Delayed swallow initiation-pyriform sinuses;Penetration/Aspiration during swallow;Reduced epiglottic inversion;Reduced anterior laryngeal mobility;Reduced laryngeal elevation;Reduced airway/laryngeal  closure Pharyngeal Material enters airway, passes BELOW cords without attempt by patient to eject out (silent aspiration) Pharyngeal- Honey Cup -- Pharyngeal -- Pharyngeal- Nectar Teaspoon -- Pharyngeal -- Pharyngeal- Nectar Cup -- Pharyngeal -- Pharyngeal- Nectar Straw -- Pharyngeal -- Pharyngeal- Thin Teaspoon -- Pharyngeal -- Pharyngeal- Thin Cup -- Pharyngeal -- Pharyngeal- Thin Straw -- Pharyngeal -- Pharyngeal- Puree Pharyngeal residue - valleculae;Reduced epiglottic inversion;Reduced anterior laryngeal mobility Pharyngeal -- Pharyngeal- Mechanical Soft -- Pharyngeal -- Pharyngeal- Regular -- Pharyngeal -- Pharyngeal- Multi-consistency -- Pharyngeal -- Pharyngeal- Pill -- Pharyngeal -- Pharyngeal Comment --  CHL IP CERVICAL ESOPHAGEAL PHASE 08/12/2017 Cervical Esophageal Phase WFL Pudding Teaspoon -- Pudding Cup -- Honey Teaspoon -- Honey Cup -- Nectar Teaspoon -- Nectar Cup -- Nectar Straw -- Thin Teaspoon -- Thin Cup -- Thin Straw -- Puree -- Mechanical Soft -- Regular -- Multi-consistency -- Pill -- Cervical Esophageal Comment -- No flowsheet data found. 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 Does the need for close, 24 hr/day medical supervision in concert with the patient's rehab needs make it unreasonable for this patient to be served in a less intensive setting? Potentially 2. Co-Morbidities  requiring supervision/potential complications: Incontinence of bowel and bladder, severe dysphagia, nothing by mouth 3. Due to bladder management, bowel management, safety, skin/wound care, disease management, medication administration, pain management and patient education, does the patient require 24 hr/day rehab nursing? Yes 4. Does the patient require coordinated care of a physician, rehab nurse, PT (1-2 hrs/day, 5 days/week), OT (1-2 hrs/day, 5 days/week) and SLP (.5-1 hrs/day, 5 days/week) to address physical and functional deficits in the context of the above medical diagnosis(es)? Yes Addressing deficits in the following areas: balance, endurance, locomotion, strength, transferring, bowel/bladder control, bathing, dressing, feeding, grooming, toileting, cognition, speech, language, swallowing, psychosocial support and followup needs 5. Can the patient actively participate in an intensive therapy program of at least 3 hrs of therapy per day at least 5 days per week? No 6. The potential for patient to make measurable gains while on inpatient rehab is currently poor but should improve over the next week 7. Anticipated functional outcomes upon discharge from inpatient rehab are min assist  with PT, min assist with OT, min assist with SLP. 8. Estimated rehab length of stay to reach the above functional goals is: 22-26d 9. Anticipated D/C setting: home vs SNF 10. Anticipated post D/C treatments: depends on D/C environment 11. Overall Rehab/Functional Prognosis: fair to good  RECOMMENDATIONS: This patient's condition is appropriate for continued rehabilitative care in the following setting: Anticipate CIR once able to tolerate 3 hours out of bed, tolerate PT, OT, rectal tube out Patient has agreed to participate in recommended program. N/A Note that insurance prior authorization may be required for reimbursement for recommended care.  Comment: Discussed with daughter, anticipated slow  rehabilitation process. Discussed that patient will require physical assistance even after inpatient rehabilitation.  Daughter lives out of state and wishes to talk to social work about this. If patient is going to SNF would benefit from CIR to decrease burden of care, once able to participate   Cathlyn Parsons., PA-C 08/13/2017    Revision History                        Routing  History

## 2017-08-20 NOTE — H&P (Signed)
Physical Medicine and Rehabilitation Admission H&P    Chief complaint: Weakness  HPI: PHILOMENA BUTTERMORE is a 71 y.o.right handed female with past medical history of hypertension, hepatitis, tobacco abuse and CVA status post loop recorder September 2017. Per chart review patient lives alone independently prior to admission and still working. One level home 6 steps to entry. As part of patient's workup from CVA 2017 she had a CT of her neck that showed right upper lobe groundglass opacity. CT the chest showed a 13 x 9 x 8 mm opacity and follow-up recommended. She was seen outpatient by Dr. Melvyn Novas with follow-up CT in November in March showing no significant change. Recently another follow-up scan showed increase in size to 13 x 20 mm. She was admitted 07/17/2017 for ongoing evaluation and care with right upper lobe wedge resection biopsy adenocarcinoma. Underwent VATS, thoracic right upper lobectomy, lymph node dissection 07/30/2017 per Dr. Roxan Hockey. Hospital course pain management. On 08/01/2017 patient developed acute respiratory distress accompanied by crepitus over her right chest wall and was intubated. Chest x-ray showed small apical pneumothorax. A chest tube was later placed 08/03/2017 and has since been removed. Patient later extubated 08/11/2017. CT head on 08/05/17 reviewed, showing air.  Hospital course acute blood loss anemia and monitored. Initially with nasogastric tube feeds modified barium swallow completed diet has been advanced to dysphagia #2 thin liquids and nasogastric tube removed. Subcutaneous Lovenox added for DVT prophylaxis. Physical and occupational therapy evaluation completed 08/12/2017 with recommendations of physical medicine rehabilitation consult. Patient was admitted for a comprehensive rehabilitation program.  Review of Systems  Constitutional: Negative for chills and fever.  HENT: Negative for hearing loss.   Eyes: Negative for blurred vision and double vision.    Respiratory: Positive for cough and shortness of breath.   Cardiovascular: Negative for chest pain.  Gastrointestinal: Positive for constipation. Negative for nausea and vomiting.  Genitourinary: Negative for dysuria, flank pain and hematuria.  Musculoskeletal: Positive for myalgias.  Skin: Negative for rash.  Neurological: Positive for headaches. Negative for seizures.  All other systems reviewed and are negative.  Past Medical History:  Diagnosis Date  . Arthritis   . GERD (gastroesophageal reflux disease)    tums  . Headache   . Hepatitis    cmv 30 yrs ago hepatitis  . History of hiatal hernia   . History of loop recorder    after stroke  . Hypertension   . Stroke ([place]) 07/2016   balance a little off  . Tobacco use    Past Surgical History:  Procedure Laterality Date  . APPENDECTOMY    . DERMOID CYST  EXCISION     x2 51'  . EP IMPLANTABLE DEVICE N/A 07/23/2016   Procedure: Loop Recorder Insertion;  Surgeon: Thompson Grayer, MD;  Location: [facility];  Service: Cardiovascular;  Laterality: N/A;  . Intestinal obstruction  1979  . LOBECTOMY Right 07/30/2017   Procedure: LOBECTOMY;  Surgeon: [name], MD;  Location: [facility];  Service: Thoracic;  Laterality: Right;  . TEE WITHOUT CARDIOVERSION N/A 07/23/2016   Procedure: TRANSESOPHAGEAL ECHOCARDIOGRAM (TEE);  Surgeon: Lelon Perla, MD;  Location: [facility];  Service: Cardiovascular;  Laterality: N/A;  . VIDEO ASSISTED THORACOSCOPY (VATS)/WEDGE RESECTION Right 07/30/2017   Procedure: VIDEO ASSISTED THORACOSCOPY (VATS)/WEDGE RESECTION;  Surgeon: [name], MD;  Location: [facility] OR;  Service: Thoracic;  Laterality: Right;   Family History  Problem Relation Age of Onset  . Kidney disease Maternal Grandmother   .  Cancer Maternal Grandfather    Social History:  reports that she quit smoking about 13 months ago. Her smoking use included Cigarettes. She has a 50.00 pack-year smoking history. She has  never used smokeless tobacco. She reports that she drinks alcohol. She reports that she does not use drugs. Allergies: No Known Allergies Medications Prior to Admission  Medication Sig Dispense Refill  . amLODipine (NORVASC) 10 MG tablet TAKE 1 TABLET BY MOUTH EVERY DAY (Patient taking differently: TAKE 10 MG BY MOUTH EVERY DAY) 90 tablet 0  . aspirin 325 MG tablet TAKE 1 TABLET BY MOUTH EVERY DAY 30 tablet 11  . atorvastatin (LIPITOR) 40 MG tablet TAKE 1 TABLET BY MOUTH EVERY DAY AT 6 p.m. 90 tablet 0  . calcium carbonate (TUMS - DOSED IN MG ELEMENTAL CALCIUM) 500 MG chewable tablet Chew 1 tablet by mouth 4 (four) times daily as needed for indigestion or heartburn.    . diphenhydrAMINE (BENADRYL) 25 MG tablet Take 50 mg by mouth at bedtime.     . metoprolol (LOPRESSOR) 50 MG tablet Take 1 tablet (50 mg total) by mouth 2 (two) times daily. 60 tablet 11  . nicotine polacrilex ([employer] NICOTINE) 2 MG lozenge Take 1 mg by mouth as needed for smoking cessation (cuts lozenges in half).     Marland Kitchen acetaminophen (TYLENOL) 325 MG tablet Take 650 mg by mouth daily as needed for mild pain or headache.      Drug was reviewed and remains appropriate no significant issues identified   Home: Home Living Family/patient expects to be discharged to:: Private residence Living Arrangements: Alone Available Help at Discharge:  (daughter states cousin, Malachy Mood and friend, Marvell Fuller ) Type of Home: House Home Access: Stairs to enter [employer] of Steps: 6 Entrance Stairs-Rails: Right, Left, Can reach both Home Layout: One level Bathroom Shower/Tub: Multimedia programmer: Standard Bathroom Accessibility: No Home Equipment: Grab bars - toilet, Grab bars - tub/shower, Environmental consultant - 2 wheels, Cane - single point, Shower seat Additional Comments: daughter from [place]; States pta, pt was to go stay with either Malachy Mood or Lolita Patella to recuperate  Lives With: Alone   Functional History: Prior  Function Level of Independence: Independent Comments: 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, adult Status:  Mobility: [place] bed mobility: Needs Assistance Bed Mobility: Supine to Sit Rolling: Min assist Sidelying to sit: Min assist Supine to sit: Min assist Sit to supine: Max assist, +2 for physical assistance General bed mobility comments: assist to elevate trunk and brings hips toward EOB Transfers Overall transfer level: Needs assistance Equipment used: Rolling walker (2 wheeled) Transfer via Lift Equipment: Stedy Transfers: Sit to/from Stand, [employer] Sit to Stand: Min assist Stand pivot transfers: Mod assist General transfer comment: assist to power up into standing from EOB, BSC, and recliner and assist for balance when pivoting EOB to [facility]; cues for safe hand placement; use of RW for balance when standing for pericare Ambulation/Gait Ambulation/Gait assistance: Min assist, +2 safety/equipment (chair follow) Ambulation Distance (Feet):  (74ft then 68ft ) Assistive device: Rolling walker (2 wheeled) Gait Pattern/deviations: Step-through pattern, Decreased step length - right, Decreased step length - left, Trunk flexed, Narrow base of support General Gait Details: one seated rest break required due to fatigue; multimodal cues for posture and proximity to RW Gait velocity: decreased Gait velocity interpretation: Below normal speed for age/gender    ADL: ADL Overall ADL's : Needs assistance/impaired Eating/Feeding: NPO Grooming: Wash/dry hands, Wash/dry face,  Oral care, Brushing hair, Sitting, Minimal assistance Upper Body Bathing: Moderate assistance, Sitting Lower Body Bathing: Maximal assistance, Sit to/from stand Upper Body Dressing : Total assistance, Sitting Lower Body Dressing: Total assistance, Sit to/from stand Toilet Transfer: Minimal assistance, Stand-pivot, BSC, RW Toileting- Clothing Manipulation and Hygiene: Maximal  assistance, Sit to/from stand Functional mobility during ADLs: Minimal assistance, Rolling walker  Cognition: Cognition Overall Cognitive Status: Impaired/Different from baseline Orientation Level: Oriented X4 Cognition Arousal/Alertness: Awake/alert Behavior During Therapy: WFL for tasks assessed/performed Overall Cognitive Status: Impaired/Different from baseline Area of Impairment: Attention, Memory, Problem solving Orientation Level: Disoriented to, Time Current Attention Level: Selective Memory: Decreased short-term memory Following Commands: Follows multi-step commands with increased time, Follows one step commands consistently Safety/Judgement: Decreased awareness of safety Problem Solving: Slow processing, Requires tactile cues, Requires verbal cues General Comments: cognition/processing worsens as she fatigues.  She frequently repeats things and askes repeated questions.    Physical Exam: Blood pressure 116/66, pulse 80, temperature 98.9 F (37.2 C), temperature source Oral, resp. rate (!) 22, height 5' 2.75" (1.594 m), weight 81 kg (178 lb 9.2 oz), SpO2 98 %. Physical Exam  Constitutional: She is oriented to person, place, and time. She appears well-developed and well-nourished.  HENT:  Head: Normocephalic and atraumatic.  Eyes: EOM are normal. Right eye exhibits no discharge. Left eye exhibits no discharge.  Neck: Normal range of motion. Neck supple. No thyromegaly present.  Cardiovascular: Normal rate and regular rhythm.   Respiratory: Effort normal and breath sounds normal.  Limited inspiratory effort but clear to auscultation  GI: Soft. Bowel sounds are normal. She exhibits no distension.  Musculoskeletal: She exhibits no edema or tenderness.  Neurological: She is alert and oriented to person, place, and time.  Follow simple commands.  Motor: B/l UE 4-/5 proximal to distal B/l LE: 3-/4 proximal to distal   Skin: Skin is warm and dry.  Psychiatric: She has a normal  mood and affect. Her behavior is normal.   Results for orders placed or performed during the hospital encounter of 07/30/17 (from the past 48 hour(s))  Glucose, capillary     Status: Abnormal   Collection Time: 08/19/17  8:42 PM  Result Value Ref Range   Glucose-Capillary 112 (H) 65 - 99 mg/dL   Comment 1 Notify RN    Comment 2 Document in Chart   Glucose, capillary     Status: Abnormal   Collection Time: 08/20/17 12:25 AM  Result Value Ref Range   Glucose-Capillary 101 (H) 65 - 99 mg/dL   Comment 1 Notify RN    Comment 2 Document in Chart   Glucose, capillary     Status: None   Collection Time: 08/20/17  5:24 AM  Result Value Ref Range   Glucose-Capillary 99 65 - 99 mg/dL  Glucose, capillary     Status: Abnormal   Collection Time: 08/20/17  8:56 AM  Result Value Ref Range   Glucose-Capillary 133 (H) 65 - 99 mg/dL  Glucose, capillary     Status: Abnormal   Collection Time: 08/20/17 12:42 PM  Result Value Ref Range   Glucose-Capillary 165 (H) 65 - 99 mg/dL  Glucose, capillary     Status: None   Collection Time: 08/20/17  3:53 PM  Result Value Ref Range   Glucose-Capillary 94 65 - 99 mg/dL   Dg Chest 2 View  Result Date: 08/19/2017 CLINICAL DATA:  Status post lobectomy. EXAM: CHEST  2 VIEW COMPARISON:  08/18/2017 . FINDINGS: Feeding tube noted with tip below  left hemidiaphragm. PICC line noted. Tip again appears to be coiled in the azygos vein. Surgical clips right hilum. Surgical sutures right lung. Cardiac monitor device noted in stable position. Heart size normal. Stable postsurgical changes right lung. No acute infiltrate. No pleural effusion or pneumothorax. Improved right chest wall subcutaneous emphysema . IMPRESSION: 1. Left PICC line noted with tip coiled in the azygos vein. Feeding tube in stable position. 2. Postsurgical changes right lung. Interim improvement of right chest wall subcutaneous emphysema. No pneumothorax. Electronically Signed   By: Marcello Moores  Register   On:  08/19/2017 10:54   Dg Swallowing Func-speech Pathology  Result Date: 08/19/2017 Objective Swallowing Evaluation: Type of Study: MBS-Modified Barium Swallow Study Patient Details Name: KHUSHBU PIPPEN MRN: [id number] Date of Birth: [birth year] Today's Date: 08/19/2017 Time: SLP Start Time (ACUTE ONLY): 1015-SLP Stop Time (ACUTE ONLY): 1033 SLP Time Calculation (min) (ACUTE ONLY): 18 min Past Medical History: Past Medical History: Diagnosis Date . Arthritis  . GERD (gastroesophageal reflux disease)   tums . Headache  . Hepatitis   cmv 30 yrs ago hepatitis . History of hiatal hernia  . History of loop recorder   after stroke . Hypertension  . Stroke ([place]) 07/2016  balance a little off . Tobacco use  Past Surgical History: Past Surgical History: Procedure Laterality Date . APPENDECTOMY   . DERMOID CYST  EXCISION    x2 28' . EP IMPLANTABLE DEVICE N/A 07/23/2016  Procedure: Loop Recorder Insertion;  Surgeon: Thompson Grayer, MD;  Location: [place] [facility];  Service: Cardiovascular;  Laterality: N/A; . Intestinal obstruction  1979 . LOBECTOMY Right 07/30/2017  Procedure: LOBECTOMY;  Surgeon: [name], MD;  Location: [place];  Service: Thoracic;  Laterality: Right; . TEE WITHOUT CARDIOVERSION N/A 07/23/2016  Procedure: TRANSESOPHAGEAL ECHOCARDIOGRAM (TEE);  Surgeon: Lelon Perla, MD;  Location[facility] ENDOSCOPY;  Service: Cardiovascular;  Laterality: N/A; . VIDEO ASSISTED THORACOSCOPY (VATS)/WEDGE RESECTION Right 07/30/2017  Procedure: VIDEO ASSISTED THORACOSCOPY (VATS)/WEDGE RESECTION;  Surgeon: [name], MD;  Location: [facility] OR;  Service: Thoracic;  Laterality: Right; HPI: Pt with planned thoracoscopy wedge resection for RUL nodule 07/30/17. Required intubation 9/28 due to acute hypoxic respiratory failure. Pt's ETT was not maintaining cuff pressure and she required emergent anesthesia for tube change. Extubated 10/8 and pt pulled Cortrak tube. CXR Right chest tubes in place ; no convincing evidence of  residual pneumothorax. Right-sided postoperative volume loss and atelectasis. PMH: CVA 2017, GERD, HTN. Objective assessment recommended after BSE to fully assess swallow function (SLP rec'd FEES however pt preferred MBS). Repeat MBS today for possible initiation of po's. No Data Recorded Assessment / Plan / Recommendation CHL IP CLINICAL IMPRESSIONS 08/19/2017 Clinical Impression Much improved swallow function compared to prior MBS. Mild oral transit delay/hesitation to increase oral control. Mild motor based pharyngeal dysphagia marked by incomplete epiglottic inversion caused by Cortrak tube leading to mild residue (mod after solid). Sensed aspiration of thin using straw suspect from residue in pyriform sinuses (difficult to fully view). Functional timing and coordination of swallow initiation. Esophageal scan without overt findings upon brief observation. Recommend thin liquids, no straws Dys 2 (ground- pt preference), pills whole in puree, sit upright and small bites/sips. Continue ST intervention.  SLP Visit Diagnosis Dysphagia, oropharyngeal phase (R13.12) Attention and concentration deficit following -- Frontal lobe and executive function deficit following -- Impact on safety and function (No Data)   CHL IP TREATMENT RECOMMENDATION 08/19/2017 Treatment Recommendations Therapy as outlined in treatment plan below   Prognosis 08/19/2017  Prognosis for Safe Diet Advancement Good Barriers to Reach Goals -- Barriers/Prognosis Comment -- CHL IP DIET RECOMMENDATION 08/19/2017 SLP Diet Recommendations Dysphagia 2 (Fine chop) solids;Thin liquid Liquid Administration via Cup;No straw Medication Administration Whole meds with puree Compensations Slow rate;Small sips/bites Postural Changes Seated upright at 90 degrees   CHL IP OTHER RECOMMENDATIONS 08/19/2017 Recommended Consults -- Oral Care Recommendations Oral care BID Other Recommendations --   CHL IP FOLLOW UP RECOMMENDATIONS 08/19/2017 Follow up Recommendations  Inpatient Rehab   CHL IP FREQUENCY AND DURATION 08/19/2017 Speech Therapy Frequency (ACUTE ONLY) min 2x/week Treatment Duration 2 weeks      CHL IP ORAL PHASE 08/19/2017 Oral Phase Impaired Oral - Pudding Teaspoon -- Oral - Pudding Cup -- Oral - Honey Teaspoon NT Oral - Honey Cup Delayed oral transit Oral - Nectar Teaspoon -- Oral - Nectar Cup Delayed oral transit Oral - Nectar Straw -- Oral - Thin Teaspoon -- Oral - Thin Cup Delayed oral transit Oral - Thin Straw -- Oral - Puree NT Oral - Mech Soft -- Oral - Regular -- Oral - Multi-Consistency -- Oral - Pill -- Oral Phase - Comment --  CHL IP PHARYNGEAL PHASE 08/19/2017 Pharyngeal Phase Impaired Pharyngeal- Pudding Teaspoon -- Pharyngeal -- Pharyngeal- Pudding Cup -- Pharyngeal -- Pharyngeal- Honey Teaspoon NT Pharyngeal -- Pharyngeal- Honey Cup Pharyngeal residue - valleculae;Reduced epiglottic inversion Pharyngeal -- Pharyngeal- Nectar Teaspoon -- Pharyngeal -- Pharyngeal- Nectar Cup Pharyngeal residue - valleculae;Reduced epiglottic inversion Pharyngeal -- Pharyngeal- Nectar Straw -- Pharyngeal -- Pharyngeal- Thin Teaspoon -- Pharyngeal -- Pharyngeal- Thin Cup Pharyngeal residue - valleculae;Pharyngeal residue - pyriform;Reduced epiglottic inversion;Reduced laryngeal elevation Pharyngeal -- Pharyngeal- Thin Straw Penetration/Aspiration during swallow;Pharyngeal residue - valleculae;Reduced epiglottic inversion Pharyngeal Material enters airway, passes BELOW cords and not ejected out despite cough attempt by patient Pharyngeal- Puree NT Pharyngeal -- Pharyngeal- Mechanical Soft -- Pharyngeal -- Pharyngeal- Regular Pharyngeal residue - valleculae;Reduced epiglottic inversion Pharyngeal -- Pharyngeal- Multi-consistency -- Pharyngeal -- Pharyngeal- Pill -- Pharyngeal -- Pharyngeal Comment --  CHL IP CERVICAL ESOPHAGEAL PHASE 08/19/2017 Cervical Esophageal Phase WFL Pudding Teaspoon -- Pudding Cup -- Honey Teaspoon -- Honey Cup -- Nectar Teaspoon -- Nectar Cup --  Nectar Straw -- Thin Teaspoon -- Thin Cup -- Thin Straw -- Puree -- Mechanical Soft -- Regular -- Multi-consistency -- Pill -- Cervical Esophageal Comment -- No flowsheet data found. Houston Siren 08/19/2017, 11:16 AM  Orbie Pyo Colvin Caroli.Ed CCC-SLP Pager [phone]             Medical Problem List and Plan: 1.  Debilitation secondary to respiratory failure after VATS procedure with right upper lobe wedge resection with biopsy positive for adenocarcinoma 2.  DVT Prophylaxis/Anticoagulation:  Subcutaneous  Lovenox. Monitor for any bleeding episodes 3. Pain Management: Tylenol as needed 4. Mood:  Klonopin 0.5 mg twice a day as needed anxiety, Risperdal 0.5 mg twice a day as needed agitation 5. Neuropsych: This patient is capable of making decisions on her own behalf. 6. Skin/Wound Care: Routine skin checks 7. Fluids/Electrolytes/Nutrition: Routine I&O with follow-up chemistries 8. Dysphagia. Dysphagia #2 thin liquids.  9. Acute blood loss anemia. Follow-up CBC 10. Pneumothorax. Chest tube removed 11. Hypertension. Norvasc 10 mg daily, clonidine 0.1 mg twice a day, Lopressor 12.5 mg twice a day. Monitor with increased mobility 12. PAF. Amiodarone 200 mg daily. Cardiac rate control 13. History of CVA September 2017. Status post loop recorder 14. Hyperlipidemia. Lipitor 15. Tobacco abuse. Counseling 16. Hypernatremia: Follow BMP. 17. Leukocytosis: Follow CBC.  Post Admission Physician Evaluation:  1. Preadmission assessment reviewed and changes made below. 2. Functional deficits secondary  to debility. 3. Patient is admitted to receive collaborative, interdisciplinary care between the physiatrist, rehab nursing staff, and therapy team. 4. Patient's level of medical complexity and substantial therapy needs in context of that medical necessity cannot be provided at a lesser intensity of care such as a SNF. 5. Patient has experienced substantial functional loss from his/her baseline which was  documented above under the "Functional History" and "Functional Status" headings.  Judging by the patient's diagnosis, physical exam, and functional history, the patient has potential for functional progress which will result in measurable gains while on inpatient rehab.  These gains will be of substantial and practical use upon discharge  in facilitating mobility and self-care at the household level. 87. Physiatrist will provide 24 hour management of medical needs as well as oversight of the therapy plan/treatment and provide guidance as appropriate regarding the interaction of the two. 7. 24 hour rehab nursing will assist with safety, disease management, pain management and patient education  and help integrate therapy concepts, techniques,education, etc. 8. PT will assess and treat for/with: Lower extremity strength, range of motion, stamina, balance, functional mobility, safety, adaptive techniques and equipment, woundcare, coping skills, pain control, education.   Goals are: Supervision/Mod I. 9. OT will assess and treat for/with: ADL's, functional mobility, safety, upper extremity strength, adaptive techniques and equipment, wound mgt, ego support, and community reintegration.   Goals are: Supervision/Mod I. Therapy may proceed with showering this patient. 10. SLP will assess and treat for/with: swallowing.  Goals are: Mod I 11. Case Management and Social Worker will assess and treat for psychological issues and discharge planning. 12. Team conference will be held weekly to assess progress toward goals and to determine barriers to discharge. 13. Patient will receive at least 3 hours of therapy per day at least 5 days per week. 14. ELOS: 11-16 days.       15. Prognosis:  good  Delice Lesch, MD, ABPMR Lauraine Rinne J., PA-C 08/20/2017

## 2017-08-20 NOTE — Progress Notes (Signed)
Physical Therapy Treatment Patient Details Name: Kathleen Guerrero MRN: [id number] DOB: 10-13-1946 Today's Date: 08/20/2017    History of Present Illness 71 y/o female underwent resection of her RUL on 9/26, developed worsening respiratory failure on 9/28 requiring intubation with extubation 10/8. PMHx: tobacco abuse (one pack per day 50 years), hypertension, dyslipidemia, and a stroke    PT Comments    Patient continues to make progress with mobility and is very eager to participate in therapy. Vitals WNL throughout session and pt on RA.  Patient continues to be good candidate for CIR level therapies.   Follow Up Recommendations  CIR;Supervision/Assistance - 24 hour     Equipment Recommendations  None recommended by PT    Recommendations for Other Services OT consult     Precautions / Restrictions Precautions Precautions: Fall    Mobility  Bed Mobility Overal bed mobility: Needs Assistance Bed Mobility: Supine to Sit     Supine to sit: Min assist     General bed mobility comments: assist to elevate trunk and brings hips toward EOB  Transfers Overall transfer level: Needs assistance Equipment used: Rolling walker (2 wheeled) Transfers: Sit to/from [facility] Sit to Stand: Min assist Stand pivot transfers: Mod assist       General transfer comment: assist to power up into standing from EOB, BSC, and recliner and assist for balance when pivoting EOB to [facility]; cues for safe hand placement; use of RW for balance when standing for pericare  Ambulation/Gait Ambulation/Gait assistance: Min assist;+2 safety/equipment (chair follow) Ambulation Distance (Feet):  (71ft then 6ft ) Assistive device: Rolling walker (2 wheeled) Gait Pattern/deviations: Step-through pattern;Decreased step length - right;Decreased step length - left;Trunk flexed;Narrow base of support Gait velocity: decreased   General Gait Details: one seated rest break required due to fatigue;  multimodal cues for posture and proximity to [employer] Mobility    Modified Rankin (Stroke Patients Only)       Balance Overall balance assessment: Needs assistance Sitting-balance support: Feet supported;No upper extremity supported Sitting balance-Leahy Scale: Good     Standing balance support: Bilateral upper extremity supported Standing balance-Leahy Scale: Poor Standing balance comment: reliant on UE support                             Cognition Arousal/Alertness: Awake/alert Behavior During Therapy: WFL for tasks assessed/performed Overall Cognitive Status: Impaired/Different from baseline Area of Impairment: Attention;Memory;Problem solving                 Orientation Level: Disoriented to;Time Current Attention Level: Selective Memory: Decreased short-term memory Following Commands: Follows multi-step commands with increased time;Follows one step commands consistently     Problem Solving: Slow processing;Requires tactile cues;Requires verbal cues General Comments: cognition/processing worsens as she fatigues.  She frequently repeats things and askes repeated questions.        Exercises      General Comments General comments (skin integrity, edema, etc.): vitals WNL on RA      Pertinent Vitals/Pain Pain Assessment: No/denies pain    Home Living                      Prior Function            PT Goals (current goals can now be found in the care plan section) Acute Rehab PT Goals Patient Stated Goal:  return home PT Goal Formulation: With patient Time For Goal Achievement: 08/26/17 Potential to Achieve Goals: Good Progress towards PT goals: Progressing toward goals    Frequency    Min 3X/week      PT Plan Current plan remains appropriate    Co-evaluation              AM-PAC PT "6 Clicks" Daily Activity  Outcome Measure  Difficulty turning over in bed (including adjusting  bedclothes, sheets and blankets)?: A Lot Difficulty moving from lying on back to sitting on the side of the bed? : Unable Difficulty sitting down on and standing up from a chair with arms (e.g., wheelchair, bedside commode, etc,.)?: Unable Help needed moving to and from a bed to chair (including a wheelchair)?: A Little Help needed walking in hospital room?: A Little Help needed climbing 3-5 steps with a railing? : A Lot 6 Click Score: 12    End of Session Equipment Utilized During Treatment: Gait belt Activity Tolerance: Patient tolerated treatment well Patient left: in chair;with call bell/phone within reach;with chair alarm set;with nursing/sitter in room Nurse Communication: Mobility status PT Visit Diagnosis: Other abnormalities of gait and mobility (R26.89);Difficulty in walking, not elsewhere classified (R26.2);Muscle weakness (generalized) (M62.81)     Time: 4492-0100 PT Time Calculation (min) (ACUTE ONLY): 34 min  Charges:  $Gait Training: 8-22 mins $Therapeutic Activity: 8-22 mins                    G Codes:       Kathleen Guerrero, PTA Pager: [phone]     Darliss Cheney 08/20/2017, 12:13 PM

## 2017-08-20 NOTE — Discharge Instructions (Signed)
Please keep wound clean and dry.... Apply Bacitracin ointment BID 1. Please obtain vital signs at least one time daily 2.Please weigh the patient daily. If he or she continues to gain weight or develops lower extremity edema, contact the office at [phone]. 3. Ambulate patient at least three times daily and please use sternal precautions.    Video-Assisted Thoracic Surgery  Video-assisted thoracic surgery (VATS) is a procedure that allows your surgeon to look inside your chest and perform minor procedures as needed. The thoracic area is between the neck and abdomen. VATS is commonly done to:  Study or diagnose problems in the chest.  Remove a tissue sample (biopsy) to be examined.  Put medicines directly into the chest.  Remove collections of fluid, pus, or blood.  Remove tumors.  Remove a part of a lung (lobectomy).  Treat some problems with the spine, including: ? Abnormal curves (scoliosis or kyphosis). ? Breaks (fractures). ? Tumors.  VATS is done using thoracoscopy. Thoracoscopy is a procedure in which a thin tube with a light and camera on the end (thoracoscope) is inserted through a small incision in the chest wall. The thoracoscope sends images to a video monitor that your surgeon will use to view the inside of your chest. Tell a health care provider about:  Any allergies you have.  All medicines you are taking, including vitamins, herbs, eye drops, creams, and over-the-counter medicines.  Any problems you or family members have had with anesthetic medicines.  Any surgeries you have had.  Any medical conditions you have.  Any blood disorders you have.  Whether you are pregnant or may be pregnant. What are the risks? Generally, this is a safe procedure. However, problems may occur, including:  Infection.  Severe bleeding (hemorrhage).  Allergic reaction to medicines.  Damage to structures or organs in the chest, such as nerves.  Lung infection  (pneumonia).  Inability to complete the procedure. If this is the case, your chest may need to be opened with a large incision (thoracotomy).  What happens before the procedure? Medicines  Ask your health care provider about: ? Changing or stopping your regular medicines. This is especially important if you are taking diabetes medicines or blood thinners. ? Taking medicines such as aspirin and ibuprofen. These medicines can thin your blood. Do not take these medicines before your procedure if your health care provider instructs you not to.  You may be given antibiotic medicine to help prevent infection. Staying hydrated Follow instructions from your health care provider about hydration, which may include:  Up to 2 hours before the procedure - you may continue to drink clear liquids, such as water, clear fruit juice, black coffee, and plain tea.  Eating and drinking restrictions Follow instructions from your health care provider about eating and drinking, which may include:  8 hours before the procedure - stop eating heavy meals or foods such as meat, fried foods, or fatty foods.  6 hours before the procedure - stop eating light meals or foods, such as toast or cereal.  6 hours before the procedure - stop drinking milk or drinks that contain milk.  2 hours before the procedure - stop drinking clear liquids.  General instructions  Ask your health care provider how your surgical site will be marked or identified.  You may be asked to shower with a germ-killing soap.  You may have a blood or urine sample taken.  You may have imaging tests, such as: ? Chest X-ray. ? Electrocardiogram (  ECG). ? Ultrasound. ? CT scan.  Do not use any products that contain nicotine or tobacco for as long as possible before your procedure. These include cigarettes and e-cigarettes. If you need help quitting, ask your health care provider.  Plan to have someone take you home from the hospital or  clinic.  If you will be going home right after the procedure, plan to have someone with you for 24 hours. What happens during the procedure?  To lower your risk of infection: ? Your health care team will wash or sanitize their hands. ? Your skin will be washed with soap.  An IV tube will be inserted into one of your veins.  You will be given one or more of the following: ? A medicine to make you fall asleep (general anesthetic). ? A medicine to help you relax (sedative). ? A medicine that is injected into an area of your body to numb everything below the injection site (regional anesthetic). This is less common for this procedure. It may be given if you are not able to tolerate general anesthetic.  A thin tube (catheter) will be inserted into your bladder and your tube that carries urine out of your body (urethra). The catheter will drain your urine.  1-4 small incisions will be made in your chest. The number of incisions depends on the purpose of your procedure.  The thoracoscope will be inserted into your incision(s) and used to view the inside of your chest.  One of your lungs will be deflated. This makes it easier for your surgeon to see the area.  Other surgical instruments may be inserted through your incision(s) to perform necessary procedures.  After any procedures are done, your lung will be inflated.  A chest tube may be inserted through an incision to drain excess fluid from the surgical area.  Any remaining incisions will be closed with stitches (sutures) or staples.  A bandage (dressing) may be placed over your incision(s). The procedure may vary among health care providers and hospitals. What happens after the procedure?  Your blood pressure, heart rate, breathing rate, and blood oxygen level will be monitored until the medicines you were given have worn off.  You may have a chest tube draining fluid from the surgical area. The chest tube will be closely monitored  for signs of fluid or air buildup in your lungs.  You may continue to receive fluids and medicines through an IV tube.  You may have to wear compression stockings. These stockings help to prevent blood clots and reduce swelling in your legs.  Do not drive for 24 hours if you were given a sedative. Summary  Video-assisted thoracic surgery (VATS) is a procedure that allows your surgeon to look inside your chest to study or diagnose problems in your thoracic area.  VATS is done by inserting a thin tube with a light and camera on the end (thoracoscope) through a small incision in the chest wall.  After the procedure, you may have a chest tube draining fluid from the surgical area. The chest tube will be closely monitored for signs of fluid or air buildup in your lungs. This information is not intended to replace advice given to you by your health care provider. Make sure you discuss any questions you have with your health care provider. Document Released: 02/15/2013 Document Revised: 10/07/2016 Document Reviewed: 10/07/2016 Elsevier Interactive Patient Education  2017 [employer].

## 2017-08-20 NOTE — Progress Notes (Signed)
I have insurance approval to admit pt to inpt rehab today. I spoke with pt and then her daughter by phone. They are in agreement to admit. RN CM made aware. I will make the arrangements to admit today. [phone]

## 2017-08-20 NOTE — Care Management Note (Signed)
Case Management Note Marvetta Gibbons RN, BSN Unit 4E-Case Manager-- [place] coverage [phone]  Patient Details  Name: Kathleen Guerrero MRN: [id number] Date of Birth: 1946-02-03  Subjective/Objective:  Pt admitted s/p VATS with wedge resection on 07/30/17                 Action/Plan: PTA pt lived at home - CM to follow for d/c needs  Expected Discharge Date:                 Expected Discharge Plan:  [place]  In-House Referral:  Clinical Social Work  Discharge planning Services  CM Consult  Post Acute Care Choice:  IP Rehab Choice offered to:  Patient  DME Arranged:    DME Agency:     HH Arranged:    [place] Agency:     Status of Service:  Completed, signed off  If discussed at [employer] of Stay Meetings, dates discussed:  10/9, 10/11, 10/16  Discharge Disposition: IP rehab- CIR  Additional Comments:  08/20/17- 1300- Marvetta Gibbons RN, CM- have been notified by Pamala Hurry with CIR that pt has insurance approval for CIR and bed available to admit today- have contacted Erin with [facility] and pt ready for d/c today- will d/c to CIR later this afternoon. Bedside RN aware  08/18/17- 1000- Reylynn Vanalstine RN, CM- CIR following for admission - have submitted for insurance approval.   08/13/17- 1600- Marvetta Gibbons RN, CM- pt with prolonged post op coarse requiring re-intubation- extubated now with some concern for airway injury.-Pt remains NPO for now- speech following- still has one CT.  Per PT/OT evals- recommendations for CIR- CIR has been consulted- spoke with pt's daughter Curt Bears via TC regarding d/c plans- CIR is potential plan if pt able to tolerate therapies- however pt may still need STSNF post CIR as daughter lives out of town- pt lives alone. - CM will follow pt progression for possible CIR- daughter also interested in speaking with CSW for SNF backup plan- will have CSW follow up with daughter for this.   Dawayne Patricia, RN 08/20/2017, 2:14 PM

## 2017-08-20 NOTE — Progress Notes (Signed)
Jamse Arn, MD Physician Signed Physical Medicine and Rehabilitation  PMR Pre-admission Date of Service: 08/19/2017 11:52 AM  Related encounter: Admission (Current) from 07/30/2017 in [facility] 4E CV SURGICAL PROGRESSIVE CARE       [] Hide copied text PMR Admission Coordinator Pre-Admission Assessment  Patient: Kathleen Guerrero is an 71 y.o., female MRN: [id number] DOB: [birth year] Height: 5' 2.75" (159.4 cm) Weight: 81 kg (178 lb 9.2 oz)                                                                                                                                                  Insurance Information HMO:     PPO: yes     PCP:      IPA:      80/20:      OTHER: Medicare advantage plan PRIMARY: A[employer]na Medicare      Policy#: [id number]      Subscriber: pt CM Name: Verdis Frederickson      Phone#: [phone]     Fax#: [phone] Pre-Cert#: [id number]    Approved until 10/22 with f/u CM [phone]  Employer: retired Benefits:  Phone #: [phone]     Name: 08/18/2017 Eff. Date: 11/04/2014     Deduct: none      Out of Pocket Max: $4500      Life Max: none CIR: $250 copy per day days 1 though 6 then covers 100%      SNF: no co pay days 1 through 20; $164 co pay per day days 21-100 Outpatient: $40 co pay per visit     Co-Pay:  Visits per medical neccesity Home Health: 100%      Co-Pay: visits per medical neccesity DME: 80%     Co-Pay: 20% Providers: in network  SECONDARY: none       Medicaid Application Date:       Case Manager:  Disability Application Date:       Case Worker:   Emergency Tax adviser Information    Name Relation Home Work Mobile   [place] Daughter [phone]       Current Medical History  Patient Admitting Diagnosis: 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 Per chart review patient lives alone independently prior to admission and still working. One level home 6 steps to entry. As part of patient's workup from CVA 2017 she had a CT of her neck that showed right upper lobe groundglass opacity. CT the chest showed a 13 x 9 x 8 mm opacity and follow-up recommended. She was seen outpatient by Dr. Melvyn Novas with follow-up CTs November in March showing no significant change. Recently another follow-up scan showed increase in  size to 13 x 20 mm.She was admitted 9/13/2018for ongoing evaluation and care with right upper lobe wedge resection biopsy adenocarcinoma. Underwent VATS, thoracic right upper lobectomy, lymph node dissection 07/30/2017 per Dr. Roxan Hockey. Hospital course pain management. On 08/01/2017 patient developed acute respiratory distress accompanied by crepitus over her right chest wall and wasintubated. Chest x-ray showed small apical pneumothorax. A chest tube was later placed 09/30/2018and has since been removed. Patient later extubated 08/11/2017. Hospital course acute blood loss anemia and monitored..Initially with nasogastric tube feeds modified barium swallow completed diet has been advanced dysphagia #2 thin liquids.Subcutaneous Lovenox added for DVT prophylaxis. Cortrak removed.  Past Medical History      Past Medical History:  Diagnosis Date  . Arthritis   . GERD (gastroesophageal reflux disease)    tums  . Headache   . Hepatitis    cmv 30 yrs ago hepatitis  . History of hiatal hernia   . History of loop recorder    after stroke  . Hypertension   . Stroke ([place]) 07/2016   balance a little off  . Tobacco use     Family History  family history includes Cancer in her maternal grandfather; Kidney disease in her maternal grandmother.  Prior Rehab/Hospitalizations:  Has the patient had major surgery during 100 days prior to admission? No  Current Medications   Current Facility-Administered  Medications:  .  [START ON 08/21/2017] amiodarone (PACERONE) tablet 200 mg, 200 mg, Oral, Daily, Hammons, Kimberly B, RPH .  amLODipine (NORVASC) tablet 10 mg, 10 mg, Oral, Daily, [name], MD, 10 mg at 08/20/17 1036 .  atorvastatin (LIPITOR) tablet 40 mg, 40 mg, Oral, q1800, Barrett, Erin R, PA-C, 40 mg at 08/19/17 1719 .  bisacodyl (DULCOLAX) EC tablet 10 mg, 10 mg, Oral, Daily, Barrett, Erin R, PA-C, 10 mg at 08/20/17 1036 .  calcium carbonate (TUMS - dosed in mg elemental calcium) chewable tablet 200 mg of elemental calcium, 1 tablet, Oral, QID PRN, Barrett, Erin R, PA-C .  chlorhexidine (PERIDEX) 0.12 % solution 15 mL, 15 mL, Mouth Rinse, BID, [name], MD, 15 mL at 08/19/17 2108 .  Chlorhexidine Gluconate Cloth 2 % PADS 6 each, 6 each, Topical, Daily, [name], MD, 6 each at 08/20/17 1251 .  clonazePAM (KLONOPIN) tablet 0.5 mg, 0.5 mg, Oral, BID PRN, [name], MD, 0.5 mg at 08/19/17 2108 .  cloNIDine (CATAPRES) tablet 0.1 mg, 0.1 mg, Oral, BID, Whiteheart, Kathryn A, NP, 0.1 mg at 08/20/17 1036 .  docusate (COLACE) 50 MG/5ML liquid 100 mg, 100 mg, Per Tube, BID PRN, Scatliffe, Kristen D, MD, 100 mg at 08/05/17 0958 .  enoxaparin (LOVENOX) injection 40 mg, 40 mg, Subcutaneous, Q24H, [name], MD, 40 mg at 08/20/17 1036 .  Gerhardt's butt cream, , Topical, QID PRN, [name], MD, 1 application at 78/29/56 1414 .  hydrALAZINE (APRESOLINE) injection 10 mg, 10 mg, Intravenous, Q6H PRN, [name], MD, 10 mg at 08/14/17 0329 .  insulin aspart (novoLOG) injection 2-6 Units, 2-6 Units, Subcutaneous, Q6H, [name], MD, 4 Units at 08/20/17 1251 .  levalbuterol (XOPENEX) nebulizer solution 0.63 mg, 0.63 mg, Nebulization, BID, [name], MD, 0.63 mg at 08/20/17 0939 .  MEDLINE mouth rinse, 15 mL, Mouth Rinse, q12n4p, [name], MD, 15 mL at 08/20/17 1000 .  metoprolol tartrate  (LOPRESSOR) tablet 12.5 mg, 12.5 mg, Oral, BID, McQuaid, Douglas B, MD, 12.5 mg at 08/20/17 1036 .  mupirocin cream (BACTROBAN)  2 %, , Topical, Daily, [name], MD .  ondansetron [facility]) injection 4 mg, 4 mg, Intravenous, Q6H PRN, Barrett, Erin R, PA-C .  pantoprazole (PROTONIX) EC tablet 40 mg, 40 mg, Oral, Q12H, Alvira Philips, RPH, 40 mg at 08/20/17 1036 .  phosphorus (K PHOS NEUTRAL) tablet 500 mg, 500 mg, Oral, TID AC & HS, Skeet Simmer, RPH, 500 mg at 08/20/17 1037 .  polymixin-bacitracin (POLYSPORIN) ointment 1 application, 1 application, Topical, BID, Hammons, Theone Murdoch, RPH .  potassium chloride 10 mEq in 50 mL *CENTRAL LINE* IVPB, 10 mEq, Intravenous, Daily PRN, Barrett, Erin R, PA-C, Stopped at 08/18/17 1053 .  potassium chloride 20 MEQ/15ML (10%) solution 40 mEq, 40 mEq, Oral, BID, [name], MD, 40 mEq at 08/20/17 1036 .  senna-docusate (Senokot-S) tablet 1 tablet, 1 tablet, Oral, QHS, Barrett, Erin R, PA-C, Stopped at 08/14/17 2042 .  sodium chloride flush (NS) 0.9 % injection 10-40 mL, 10-40 mL, Intracatheter, PRN, [name], MD, 10 mL at 08/17/17 2778  Patients Current Diet: DIET DYS 2 Room service appropriate? Yes; Fluid consistency: Thin Meds whole in puree  Precautions / Restrictions Precautions Precautions: Fall Precaution Comments: chest tube Restrictions Weight Bearing Restrictions: No   Has the patient had 2 or more falls or a fall with injury in the past year?No  Prior Activity Level Community (5-7x/wk): Independent pta  Development worker, international aid / Equipment Home Assistive Devices/Equipment: Eyeglasses, Radio producer (specify quad or straight) Home Equipment: Grab bars - toilet, Grab bars - tub/shower, Walker - 2 wheels, Cane - single point, Shower seat  Prior Device Use: Indicate devices/aids used by the patient prior to current illness, exacerbation or injury? None of the above  Prior Functional Level Prior  Function Level of Independence: Independent Comments: 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[employer] Care: Did the patient need help bathing, dressing, using the toilet or eating?  Independent  Indoor Mobility: Did the patient need assistance with walking from room to room (with or without device)? Independent  Stairs: Did the patient need assistance with internal or external stairs (with or without device)? Independent  Functional Cognition: Did the patient need help planning regular tasks such as shopping or remembering to take medications? Independent  Current Functional Level Cognition  Overall Cognitive Status: Impaired/Different from baseline Current Attention Level: Selective Orientation Level: Oriented X4 Following Commands: Follows multi-step commands with increased time, Follows one step commands consistently Safety/Judgement: Decreased awareness of safety General Comments: cognition/processing worsens as she fatigues.  She frequently repeats things and askes repeated questions.      Extremity Assessment (includes Sensation/Coordination)  Upper Extremity Assessment: Generalized weakness  Lower Extremity Assessment: Overall WFL for tasks assessed    ADLs  Overall ADL's : Needs assistance/impaired Eating/Feeding: NPO Grooming: Wash/dry hands, Wash/dry face, Oral care, Brushing hair, Sitting, Minimal assistance Upper Body Bathing: Moderate assistance, Sitting Lower Body Bathing: Maximal assistance, Sit to/from stand Upper Body Dressing : Total assistance, Sitting Lower Body Dressing: Total assistance, Sit to/from stand Toilet Transfer: Minimal assistance, Stand-pivot, BSC, RW Toileting- Clothing Manipulation and Hygiene: Maximal assistance, Sit to/from stand Functional mobility during ADLs: Minimal assistance, Rolling walker    Mobility  Overal bed mobility: Needs Assistance Bed Mobility: Supine to Sit Rolling: Min assist Sidelying to sit: Min  assist Supine to sit: Min assist Sit to supine: Max assist, +2 for physical assistance General bed mobility comments: assist to elevate trunk and brings hips toward EOB    Transfers  Overall  transfer level: Needs assistance Equipment used: Rolling walker (2 wheeled) Transfer via Lift Equipment: Stedy Transfers: Sit to/from Stand, [employer] Sit to Stand: Min assist Stand pivot transfers: Mod assist General transfer comment: assist to power up into standing from EOB, BSC, and recliner and assist for balance when pivoting EOB to [facility]; cues for safe hand placement; use of RW for balance when standing for pericare    Ambulation / Gait / Stairs / Wheelchair Mobility  Ambulation/Gait Ambulation/Gait assistance: Min assist, +2 safety/equipment (chair follow) Ambulation Distance (Feet):  (59ft then 41ft ) Assistive device: Rolling walker (2 wheeled) Gait Pattern/deviations: Step-through pattern, Decreased step length - right, Decreased step length - left, Trunk flexed, Narrow base of support General Gait Details: one seated rest break required due to fatigue; multimodal cues for posture and proximity to RW Gait velocity: decreased Gait velocity interpretation: Below normal speed for age/gender    Posture / Balance Dynamic Sitting Balance Sitting balance - Comments: close min guard assist.  Leans to Lt, and dependent on UE support  Balance Overall balance assessment: Needs assistance Sitting-balance support: Feet supported, No upper extremity supported Sitting balance-Leahy Scale: Good Sitting balance - Comments: close min guard assist.  Leans to Lt, and dependent on UE support  Postural control: Left lateral lean Standing balance support: Bilateral upper extremity supported Standing balance-Leahy Scale: Poor Standing balance comment: reliant on UE support     Special needs/care consideration BiPAP/CPAP  N/a CPM  N/a Continuous Drip IV  N/a Dialysis  N/a Life Vest   N/a Oxygen  N/a Special Bed  N/a Trach Size  N/a Wound Vac n/a Skin surgical incision; ecchymosis to abdomen                        Bowel mgmt: incontinence; rectal tube removed; continues with some diarrhea Bladder mgmt: external catheter due to incontinence Diabetic mgmt  N/a   Previous Home Environment Living Arrangements: Alone  Lives With: Alone Available Help at Discharge:  (daughter states cousin, Malachy Mood and friend, Marvell Fuller ) Type of Home: House Home Layout: One level Home Access: Stairs to enter Entrance Stairs-Rails: Right, Left, Can reach both Entrance Stairs-Number of Steps: 6 Bathroom Shower/Tub: Multimedia programmer: Standard Bathroom Accessibility: No Home Care Services: No Additional Comments: daughter from [place]; States pta, pt was to go stay with either Malachy Mood or Lolita Patella to recuperate  Discharge Living Setting Plans for Discharge Living Setting: Patient's home, Alone Type of Home at Discharge: House Discharge Home Layout: One level Discharge Home Access: Stairs to enter Entrance Stairs-Rails: Right, Left, Can reach both Entrance Stairs-Number of Steps: 6 Discharge Bathroom Shower/Tub: Walk-in shower Discharge Bathroom Toilet: Standard Discharge Bathroom Accessibility: Yes How Accessible: Accessible via walker Does the patient have any problems obtaining your medications?: No  Social/Family/Support Systems Patient Roles: Parent Contact Information: Anda Kraft, daughter Anticipated Caregiver: cousin, Malachy Mood or friend, Lolita Patella Anticipated Ambulance person Information: see above Ability/Limitations of Caregiver: none Caregiver Availability: 24/7 Discharge Plan Discussed with Primary Caregiver: Yes Is Caregiver In Agreement with Plan?: Yes Does Caregiver/Family have Issues with Lodging/Transportation while Pt is in Rehab?: No  Goals/Additional Needs Patient/Family Goal for Rehab: min assist with PT, OT, and SLP Expected length of  stay: ELOS 22-26 days Pt/Family Agrees to Admission and willing to participate: Yes Program Orientation Provided & Reviewed with Pt/Caregiver Including Roles  & Responsibilities: Yes  Barriers to Discharge: Decreased caregiver support, Insurance for SNF coverage  Barriers to Discharge Comments: daughter  feels pt can reach MOd I level to d/c home with prn assist  Decrease burden of Care through IP rehab admission: Diet advancement, Decrease number of caregivers, Bowel and bladder program and Patient/family education   Possible need for SNF placement upon discharge: I discussed with pt at bedside and with daughter, Anda Kraft, by conference call on 08/19/2017 that insurance unlikely to cover both inpatient acute rehabilitation admission as well as SNF rehab if pt doesn't reach Mod I goals. I discussed that our Rehabilitation MD feels pt will need 24/7 assist at discharge from CIR. Anda Kraft states pt was independent pta, and plans were made for her to go stay with either cousin, Malachy Mood or friend, Lolita Patella at discharge form her surgery prior to her having surgical complications.   Patient Condition: This patient's medical and functional status has changed since the consult dated 08/13/2017 in which the Rehabilitation Physician determined and documented that the patient was potentially appropriate for intensive rehabilitative care in an inpatient rehabilitation facility. Issues have been addressed and update has been discussed with Dr. Posey Pronto and patient now appropriate for inpatient rehabilitation. Will admit to inpatient rehab today.   Preadmission Screen Completed By:  Cleatrice Burke, 08/20/2017 1:07 PM ______________________________________________________________________   Discussed status with Dr. Posey Pronto on 08/20/2017 at  1307 and received telephone approval for admission today.  Admission Coordinator:  Cleatrice Burke, time [id number] Date 08/20/2017       Revision History

## 2017-08-20 NOTE — IPOC Note (Signed)
Overall Plan of Care [facility]) Patient Details Name: Kathleen Guerrero MRN: [id number] DOB: [birth year]  Admitting Diagnosis: <principal problem not specified>  Hospital Problems: Active Problems:   Debility   Critical illness myopathy   Hyponatremia   Hypoalbuminemia due to protein-calorie malnutrition ([place])   Incontinence of feces     Functional Problem List: Nursing Bladder, Bowel, Edema, Endurance, Medication Management, Nutrition, Pain, Safety, Skin Integrity  PT Balance, Behavior, Edema, Endurance, Motor, Nutrition, Pain, Perception, Safety, Sensory, Skin Integrity  OT Balance, Cognition, Endurance  SLP    TR         Basic ADL's: OT Grooming, Bathing, Dressing, Toileting     Advanced  ADL's: OT Light Housekeeping, Simple Meal Preparation     Transfers: PT Floor, Furniture, Bed to Chair, Car, [employer], Tub/Shower     Locomotion: PT Ambulation, Emergency planning/management officer, Stairs     Additional Impairments: OT None  SLP Swallowing, Social Cognition   Problem Solving, Memory  TR      Anticipated Outcomes Item Anticipated Outcome  Self Feeding independent  Swallowing  Mod I   Basic self-care  modified independent  Toileting  modified independent   Bathroom Transfers modified independent to supervision  Bowel/Bladder  patient will be continent of bowel and bladder with mac assist  Transfers  Mod I   Locomotion  Mod I   Communication     Cognition  Mod I  Pain  pain less than or equal to 4/10 with min assist  Safety/Judgment  patrient will be free from falls/injury and displaying sound safety judgement with mod assist   Therapy Plan: PT Intensity: Minimum of 1-2 x/day ,45 to 90 minutes PT Frequency: 5 out of 7 days PT Duration Estimated Length of Stay: 7-10 days OT Intensity: Minimum of 1-2 x/day, 45 to 90 minutes OT Frequency: 5 out of 7 days OT Duration/Estimated Length of Stay: 7-10 days SLP Intensity: Minumum of 1-2 x/day, 30 to 90  minutes SLP Frequency: 3 to 5 out of 7 days SLP Duration/Estimated Length of Stay: 7-10 days     Team Interventions: Nursing Interventions Patient/Family Education, Bladder Management, Bowel Management, Disease Management/Prevention, Pain Management, Medication Management, Skin Care/Wound Management  PT interventions Ambulation/gait training, Disease management/prevention, Pain management, Stair training, Visual/perceptual remediation/compensation, Wheelchair propulsion/positioning, Therapeutic Activities, Patient/family education, Training and development officer, DME/adaptive equipment instruction, Cognitive remediation/compensation, Functional electrical stimulation, Psychosocial support, Therapeutic Exercise, UE/LE Strength taining/ROM, Skin care/wound management, Functional mobility training, Community reintegration, Discharge planning, Neuromuscular re-education, Splinting/orthotics, UE/LE Coordination activities  OT Interventions Training and development officer, DME/adaptive equipment instruction, Discharge planning, Pain management, Self Care/advanced ADL retraining, Therapeutic Activities, Neuromuscular re-education, UE/LE Strength taining/ROM, Patient/family education, Functional mobility training, Therapeutic Exercise  SLP Interventions Cognitive remediation/compensation, Cueing hierarchy, Functional tasks, Environmental controls, Internal/external aids, Therapeutic Activities, Patient/family education  TR Interventions    SW/CM Interventions Discharge Planning, Psychosocial Support, Patient/Family Education   Barriers to Discharge MD  Medical stability  Nursing      PT Inaccessible home environment, Decreased caregiver support, Home environment access/layout, Lack of/limited family support, Behavior behavior - memory impairments, assistance only available intermittently  OT Decreased caregiver support    SLP      SW Decreased caregiver support Will not have 24 hr care-pt reports both are in  and out   Team Discharge Planning: Destination: PT-Home ,OT- Home , SLP-Home Projected Follow-up: PT-Home health PT, OT-  Home health OT, Other (comment) (intermittent supervision), SLP-Outpatient SLP (TBD) Projected Equipment Needs: PT-To be determined, OT-  , SLP-None  recommended by SLP Equipment Details: PT- , OT-  Patient/family involved in discharge planning: PT- Patient, Family member/caregiver,  OT-Patient, SLP-Patient  MD ELOS: 6-9 days. Medical Rehab Prognosis:  Excellent Assessment: 71 y.o.right handed female with past medical history of hypertension, hepatitis, tobacco abuse and CVA status post loop recorder September 2017. As part of patient's workup from CVA 2017 she had a CT of her neck that showed right upper lobe groundglass opacity. CT the chest showed a 13 x 9 x 8 mm opacity and follow-up recommended. She was seen outpatient by Dr. Melvyn Novas with follow-up CT in November in March showing no significant change. Recently another follow-up scan showed increase in size to 13 x 20 mm. She was admitted 07/17/2017 for ongoing evaluation and care with right upper lobe wedge resection biopsy adenocarcinoma. Underwent VATS, thoracic right upper lobectomy, lymph node dissection 07/30/2017 per Dr. Roxan Hockey. Hospital course pain management. On 08/01/2017 patient developed acute respiratory distress accompanied by crepitus over her right chest wall and was intubated. Chest x-ray showed small apical pneumothorax. A chest tube was later placed 08/03/2017 and has since been removed. Patient later extubated 08/11/2017. CT head on 08/05/17 reviewed, showing air.  Hospital course acute blood loss anemia and monitored. Initially with nasogastric tube feeds modified barium swallow completed diet has been advanced to dysphagia #2 thin liquids and nasogastric tube removed. Patient with resulting functional deficits with mobility and self-care.  Will set goals for Mod I with PT/OT/SLP.   See Team Conference Notes  for weekly updates to the plan of care

## 2017-08-21 ENCOUNTER — Inpatient Hospital Stay (HOSPITAL_COMMUNITY): Payer: Medicare HMO | Admitting: Physical Therapy

## 2017-08-21 ENCOUNTER — Inpatient Hospital Stay (HOSPITAL_COMMUNITY): Payer: Medicare HMO | Admitting: Occupational Therapy

## 2017-08-21 ENCOUNTER — Inpatient Hospital Stay (HOSPITAL_COMMUNITY): Payer: Medicare HMO | Admitting: Speech Pathology

## 2017-08-21 DIAGNOSIS — I10 Essential (primary) hypertension: Secondary | ICD-10-CM

## 2017-08-21 DIAGNOSIS — D62 Acute posthemorrhagic anemia: Secondary | ICD-10-CM

## 2017-08-21 DIAGNOSIS — E871 Hypo-osmolality and hyponatremia: Secondary | ICD-10-CM

## 2017-08-21 DIAGNOSIS — E8809 Other disorders of plasma-protein metabolism, not elsewhere classified: Secondary | ICD-10-CM

## 2017-08-21 DIAGNOSIS — R1310 Dysphagia, unspecified: Secondary | ICD-10-CM

## 2017-08-21 DIAGNOSIS — E46 Unspecified protein-calorie malnutrition: Secondary | ICD-10-CM

## 2017-08-21 DIAGNOSIS — R159 Full incontinence of feces: Secondary | ICD-10-CM

## 2017-08-21 DIAGNOSIS — R5381 Other malaise: Secondary | ICD-10-CM

## 2017-08-21 MED ORDER — PRO-STAT SUGAR FREE PO LIQD
30.0000 mL | Freq: Two times a day (BID) | ORAL | Status: DC
  Administered 2017-08-21 – 2017-08-25 (×4): 30 mL via ORAL
  Filled 2017-08-21 (×12): qty 30

## 2017-08-21 MED ORDER — SENNOSIDES-DOCUSATE SODIUM 8.6-50 MG PO TABS
2.0000 | ORAL_TABLET | Freq: Every day | ORAL | Status: DC
  Administered 2017-08-21: 2 via ORAL
  Filled 2017-08-21 (×4): qty 2

## 2017-08-21 NOTE — Progress Notes (Signed)
Physical Therapy Assessment and Plan  Patient Details  Name: Kathleen Guerrero MRN: [id number] Date of Birth: 1945-12-27  PT Diagnosis: Abnormality of gait, Cognitive deficits, Difficulty walking, Impaired cognition and Muscle weakness Rehab Potential: Good ELOS: 7-10 days   Today's Date: 08/21/2017 PT Individual Time: 0805-0900 AND 1500-1530 PT Individual Time Calculation (min): 55 min AND 30 min  Problem List:  Patient Active Problem List   Diagnosis Date Noted  . Hyponatremia   . Hypoalbuminemia due to protein-calorie malnutrition ([place])   . Incontinence of feces   . Critical illness myopathy 08/20/2017  . Debility   . Adenocarcinoma ([place])   . Anxiety state   . Agitation   . Dysphagia   . Acute blood loss anemia   . History of pneumothorax   . PAF (paroxysmal atrial fibrillation) ([place])   . History of CVA (cerebrovascular accident)   . Hyperlipidemia   . Hypernatremia   . Leukocytosis   . S/P lobectomy of lung 07/30/2017  . Lower extremity edema 04/30/2017  . TIA (transient ischemic attack) 01/16/2017  . Dyslipidemia, goal LDL below 70 01/03/2017  . Diarrhea 09/12/2016  . Sleep disturbance 07/26/2016  . Overactive bladder 07/26/2016  . Tobacco abuse 07/21/2016  . Solitary pulmonary nodule 07/21/2016  . Acute CVA (cerebrovascular accident) ([place]) 07/21/2016  . Acute lacunar stroke ([place]) 07/20/2016  . Hypokalemia 07/20/2016  . Benign essential HTN 07/20/2016    Past Medical History:  Past Medical History:  Diagnosis Date  . Arthritis   . GERD (gastroesophageal reflux disease)    tums  . Headache   . Hepatitis    cmv 30 yrs ago hepatitis  . History of hiatal hernia   . History of loop recorder    after stroke  . Hypertension   . Stroke ([place]) 07/2016   balance a little off  . Tobacco use    Past Surgical History:  Past Surgical History:  Procedure Laterality Date  . APPENDECTOMY    . DERMOID CYST  EXCISION     x2 44'  . EP IMPLANTABLE DEVICE N/A  07/23/2016   Procedure: Loop Recorder Insertion;  Surgeon: Thompson Grayer, MD;  Location: [facility];  Service: Cardiovascular;  Laterality: N/A;  . Intestinal obstruction  1979  . LOBECTOMY Right 07/30/2017   Procedure: LOBECTOMY;  Surgeon: [name], MD;  Location: [facility];  Service: Thoracic;  Laterality: Right;  . TEE WITHOUT CARDIOVERSION N/A 07/23/2016   Procedure: TRANSESOPHAGEAL ECHOCARDIOGRAM (TEE);  Surgeon: Lelon Perla, MD;  Location: [facility] ENDOSCOPY;  Service: Cardiovascular;  Laterality: N/A;  . VIDEO ASSISTED THORACOSCOPY (VATS)/WEDGE RESECTION Right 07/30/2017   Procedure: VIDEO ASSISTED THORACOSCOPY (VATS)/WEDGE RESECTION;  Surgeon: [name], MD;  Location[facility];  Service: Thoracic;  Laterality: Right;    Assessment & Plan Clinical Impression: Patient is a 71 y.o.right handed femalewith past medical history of hypertension, hepatitis, tobacco abuse and CVA status post loop recorder September 2017. Per chart review patient lives alone independently prior to admission and still working. One level home 6 steps to entry. As part of patient's workup from CVA 2017 she had a CT of her neck that showed right upper lobe groundglass opacity. CT the chest showed a 13 x 9 x 8 mm opacity and follow-up recommended. She was seen outpatient by Dr. Melvyn Novas with follow-up CT in November in March showing no significant change. Recently another follow-up scan showed increase in size to 13 x 20 mm. She was admitted 07/17/2017 for ongoing evaluation  and care with right upper lobe wedge resection biopsy adenocarcinoma. Underwent VATS, thoracic right upper lobectomy, lymph node dissection 07/30/2017 per Dr. Roxan Hockey. Hospital course pain management. On 08/01/2017 patient developed acute respiratory distress accompanied by crepitus over her right chest wall and was intubated. Chest x-ray showed small apical pneumothorax. A chest tube was later placed 08/03/2017 and has since been  removed. Patient later extubated 08/11/2017. CT head on 08/05/17 reviewed, showing air.  Hospital course acute blood loss anemia and monitored. Initially with nasogastric tube feeds modified barium swallow completed diet has been advanced to dysphagia #2 thin liquids and nasogastric tube removed. Subcutaneous Lovenox added for DVT prophylaxis. Patient transferred to CIR on 08/20/2017 .   Patient currently requires min with mobility secondary to muscle weakness, decreased cardiorespiratoy endurance, decreased memory and decreased standing balance and decreased balance strategies.  Prior to hospitalization, patient was independent  with mobility and lived with Alone in a Other(Comment) (condo) home.  Home access is 6Stairs to enter.  Patient will benefit from skilled PT intervention to maximize safe functional mobility, minimize fall risk and decrease caregiver burden for planned discharge home with intermittent assist.  Anticipate patient will benefit from follow up [facility] at discharge.  PT - End of Session Activity Tolerance: Tolerates < 10 min activity, no significant change in vital signs Endurance Deficit: Yes Endurance Deficit Description: decreased PT Assessment Rehab Potential (ACUTE/IP ONLY): Good PT Barriers to Discharge: [place] home environment;Decreased caregiver support;Home environment access/layout;Lack of/limited family support;Behavior PT Barriers to Discharge Comments: behavior - memory impairments, assistance only available intermittently PT Patient demonstrates impairments in the following area(s): Balance;Behavior;Edema;Endurance;Motor;Nutrition;Pain;Perception;Safety;Sensory;Skin Integrity PT Transfers Functional Problem(s): Floor;Furniture;Bed to Chair;Car;Bed Mobility PT Locomotion Functional Problem(s): Ambulation;Wheelchair Mobility;Stairs PT Plan PT Intensity: Minimum of 1-2 x/day ,45 to 90 minutes PT Frequency: 5 out of 7 days PT Duration Estimated Length of Stay: 7-10  days PT Treatment/Interventions: Ambulation/gait training;Disease management/prevention;Pain management;Stair training;Visual/perceptual remediation/compensation;Wheelchair propulsion/positioning;Therapeutic Activities;Patient/family education;Balance/vestibular training;DME/adaptive equipment instruction;Cognitive remediation/compensation;Functional electrical stimulation;Psychosocial support;Therapeutic Exercise;UE/LE Strength taining/ROM;Skin care/wound management;Functional mobility training;Community reintegration;Discharge planning;Neuromuscular re-education;Splinting/orthotics;UE/LE Coordination activities PT Transfers Anticipated Outcome(s): Mod I  PT Locomotion Anticipated Outcome(s): Mod I  PT Recommendation Recommendations for Other Services: Neuropsych consult Follow Up Recommendations: Home health PT Patient destination: Home Equipment Recommended: To be determined  Skilled Therapeutic Intervention  Session 1:  Pt supine upon arrival and requesting to use toilet, agreeable to therapy and no c/o pain. Pt transferred to EOB and bedside commode w/ Min A and maintained static sitting balance on bedside commode w/ supervision. Pt performed multiple sit<>stands w/ UE support on back of locked w/c while PT provided Min A for pericare and donning new brief and hospital gown. Performed functional mobility as detailed below including gait, w/c mobility, car transfer, bed mobility, and stairs. Pt declining to perform >1 step due to weakness and fatigue. Frequent seated rest breaks 2/2 fatigue that resolves w/ 1-2 min rest. PT instructed patient in PT Evaluation and initiated treatment intervention; see below for results. PT educated patient in [place], rehab potential, rehab goals, and discharge recommendations. Ended session supine in bed, call bell within reach and all needs met.   Session 2:  Pt in w/c upon arrival and agreeable to therapy, no c/o pain. Total A to transport via w/c to/from day room.  Performed 5 min x2 on NuStep @ L3 to work on endurance and LE strengthening. Returned to room and pt requesting help w/ returning to bed and applying powder to buttocks. Transferred to EOB and supine w/ Min guard  and pt rolled each direction multiple times w/ supervision. Ended session in supine, call bell within reach and all needs met.   PT Evaluation Precautions/Restrictions Precautions Precautions: Fall Restrictions Weight Bearing Restrictions: No   Vital SignsTherapy Vitals Pulse Rate: 72 BP: 112/62 Patient Position (if appropriate): Lying Oxygen Therapy SpO2: 95 % O2 Device: Not Delivered Pain Pain Assessment Pain Assessment: No/denies pain Home Living/Prior Functioning Home Living Living Arrangements: Alone Available Help at Discharge: Available PRN/intermittently;Friend(s) (Friend and cousin available intermittently) Type of Home: Other(Comment) (condo) Home Access: Stairs to enter Technical brewer of Steps: 6 Entrance Stairs-Rails: Right;Left;Can reach both Home Layout: One level Bathroom Shower/Tub: Multimedia programmer: Standard Bathroom Accessibility: No Additional Comments: pt reports condo is very small and a walker would not fit well  Lives With: Alone Prior Function Level of Independence: Independent with basic ADLs;Independent with homemaking with ambulation;Independent with gait;Independent with transfers  Able to Take Stairs?: Yes Driving: Yes Vocation: Part time employment 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, also works at [employer] (~20 hrs/week total)  Vision/Perception  Vision - Assessment Eye Alignment: Within Designer, television/film set Perception: Within Functional Limits Praxis Praxis: Intact  Cognition Overall Cognitive Status: Impaired/Different from baseline Arousal/Alertness: Awake/alert Orientation Level: Oriented X4 Attention: Focused Focused Attention:  Appears intact Memory: Impaired Memory Impairment: Decreased recall of new information;Decreased short term memory Awareness: Appears intact Problem Solving: Appears intact Safety/Judgment: Appears intact Comments: pt repeating herself often throughout session  Sensation Sensation Light Touch: Appears Intact Proprioception: Appears Intact Coordination Gross Motor Movements are Fluid and Coordinated: Yes Fine Motor Movements are Fluid and Coordinated: Yes Motor  Motor Motor - Skilled Clinical Observations: generalized weakness  Mobility Bed Mobility Bed Mobility: Rolling Right;Rolling Left;Supine to Sit;Sit to Supine Rolling Right: 5: Supervision Rolling Right Details: Verbal cues for technique Rolling Left: 5: Supervision Rolling Left Details: Verbal cues for technique Supine to Sit: 4: Min assist Supine to Sit Details: Manual facilitation for placement Sit to Supine: 4: Min assist Sit to Supine - Details: Manual facilitation for placement Transfers Transfers: Yes Sit to Stand: 4: Min assist Sit to Stand Details: Verbal cues for technique;Verbal cues for precautions/safety Stand to Sit: 4: Min assist Stand to Sit Details (indicate cue type and reason): Verbal cues for technique;Verbal cues for precautions/safety Stand Pivot Transfers: 4: Min assist Stand Pivot Transfer Details: Verbal cues for technique;Verbal cues for precautions/safety Locomotion  Ambulation Ambulation: Yes Ambulation/Gait Assistance: 4: Min assist Ambulation Distance (Feet): 40 Feet Assistive device: Rolling walker Ambulation/Gait Assistance Details: Verbal cues for technique;Verbal cues for precautions/safety;Verbal cues for safe use of DME/AE Gait Gait: Yes Gait Pattern: Trunk flexed;Decreased step length - right;Decreased step length - left Gait velocity: decreased Stairs / Additional Locomotion Stairs: Yes Stairs Assistance: 3: Mod assist Stairs Assistance Details: Manual facilitation for  placement;Verbal cues for technique Stair Management Technique: Two rails Number of Stairs: 1 Height of Stairs: 6 Wheelchair Mobility Wheelchair Mobility: Yes Wheelchair Assistance: 5: Investment banker, operational Details: Verbal cues for technique;Verbal cues for precautions/safety;Verbal cues for sequencing;Verbal cues for safe use of DME/AE Wheelchair Propulsion: Both upper extremities Wheelchair Parts Management: Supervision/cueing Distance: 50'  Trunk/Postural Assessment  Cervical Assessment Cervical Assessment: Within Functional Limits Thoracic Assessment Thoracic Assessment: Within Functional Limits Lumbar Assessment Lumbar Assessment: Within Functional Limits Postural Control Postural Control: Within Functional Limits  Balance Balance Balance Assessed: Yes Static Sitting Balance Static Sitting - Balance Support: No upper extremity supported Static Sitting - Level  of Assistance: 5: Stand by assistance Dynamic Sitting Balance Dynamic Sitting - Balance Support: No upper extremity supported Dynamic Sitting - Level of Assistance: 4: Min assist Static Standing Balance Static Standing - Balance Support: No upper extremity supported Static Standing - Level of Assistance: 4: Min assist Dynamic Standing Balance Dynamic Standing - Balance Support: No upper extremity supported Dynamic Standing - Level of Assistance: 4: Min assist Extremity Assessment  RLE Assessment RLE Assessment: Exceptions to WFL (3 to 4/5 globally) LLE Assessment LLE Assessment: Exceptions to [facility] (3 to 4/5 globally)   See Function Navigator for Current Functional Status.   Refer to Care Plan for Long Term Goals  Recommendations for other services: Neuropsych  Discharge Criteria: Patient will be discharged from PT if patient refuses treatment 3 consecutive times without medical reason, if treatment goals not met, if there is a change in medical status, if patient makes no progress towards goals or  if patient is discharged from hospital.  The above assessment, treatment plan, treatment alternatives and goals were discussed and mutually agreed upon: by patient and by family  Carlen Fils K Arnette 08/21/2017, 11:15 AM

## 2017-08-21 NOTE — Progress Notes (Addendum)
[facility] PHYSICAL MEDICINE & REHABILITATION     PROGRESS NOTE  Subjective/Complaints:  Pt seen laying in bed this AM.  She slept fairly overnight, stating that it was noisy.  She has concerns about bowel incontinence.  She is ready to begin her day of therapies. Daughter at bedside.   ROS: +Bowel incontinence. Denies CP, SOB, N/V/D.  Objective: Vital Signs: Blood pressure (!) 125/42, pulse 69, temperature 98.3 F (36.8 C), temperature source Oral, resp. rate 18, weight 80.9 kg (178 lb 5.6 oz), SpO2 95 %. Dg Chest 2 View  Result Date: 08/19/2017 CLINICAL DATA:  Status post lobectomy. EXAM: CHEST  2 VIEW COMPARISON:  08/18/2017 . FINDINGS: Feeding tube noted with tip below left hemidiaphragm. PICC line noted. Tip again appears to be coiled in the azygos vein. Surgical clips right hilum. Surgical sutures right lung. Cardiac monitor device noted in stable position. Heart size normal. Stable postsurgical changes right lung. No acute infiltrate. No pleural effusion or pneumothorax. Improved right chest wall subcutaneous emphysema . IMPRESSION: 1. Left PICC line noted with tip coiled in the azygos vein. Feeding tube in stable position. 2. Postsurgical changes right lung. Interim improvement of right chest wall subcutaneous emphysema. No pneumothorax. Electronically Signed   By: Marcello Moores  Register   On: 08/19/2017 10:54   Dg Swallowing Func-speech Pathology  Result Date: 08/19/2017 Objective Swallowing Evaluation: Type of Study: MBS-Modified Barium Swallow Study Patient Details Name: [facility] MRN: [id number] Date of Birth: 18-Aug-1946 Today's Date: 08/19/2017 Time: SLP Start Time (ACUTE ONLY): 1015-SLP Stop Time (ACUTE ONLY): 1033 SLP Time Calculation (min) (ACUTE ONLY): 18 min Past Medical History: Past Medical History: Diagnosis Date . Arthritis  . GERD (gastroesophageal reflux disease)   tums . Headache  . Hepatitis   cmv 30 yrs ago hepatitis . History of hiatal hernia  . History of loop recorder    after stroke . Hypertension  . Stroke ([place]) 07/2016  balance a little off . Tobacco use  Past Surgical History: Past Surgical History: Procedure Laterality Date . APPENDECTOMY   . DERMOID CYST  EXCISION    x2 66' . EP IMPLANTABLE DEVICE N/A 07/23/2016  Procedure: Loop Recorder Insertion;  Surgeon: Thompson Grayer, MD;  Location: [facility];  Service: Cardiovascular;  Laterality: N/A; . Intestinal obstruction  1979 . LOBECTOMY Right 07/30/2017  Procedure: LOBECTOMY;  Surgeon: [name], MD;  Location: [facility];  Service: Thoracic;  Laterality: Right; . TEE WITHOUT CARDIOVERSION N/A 07/23/2016  Procedure: TRANSESOPHAGEAL ECHOCARDIOGRAM (TEE);  Surgeon: Lelon Perla, MD;  Location: [facility] ENDOSCOPY;  Service: Cardiovascular;  Laterality: N/A; . VIDEO ASSISTED THORACOSCOPY (VATS)/WEDGE RESECTION Right 07/30/2017  Procedure: VIDEO ASSISTED THORACOSCOPY (VATS)/WEDGE RESECTION;  Surgeon: [name], MD;  Location: [facility] OR;  Service: Thoracic;  Laterality: Right; HPI: Pt with planned thoracoscopy wedge resection for RUL nodule 07/30/17. Required intubation 9/28 due to acute hypoxic respiratory failure. Pt's ETT was not maintaining cuff pressure and she required emergent anesthesia for tube change. Extubated 10/8 and pt pulled Cortrak tube. CXR Right chest tubes in place ; no convincing evidence of residual pneumothorax. Right-sided postoperative volume loss and atelectasis. PMH: CVA 2017, GERD, HTN. Objective assessment recommended after BSE to fully assess swallow function (SLP rec'd FEES however pt preferred MBS). Repeat MBS today for possible initiation of po's. No Data Recorded Assessment / Plan / Recommendation CHL IP CLINICAL IMPRESSIONS 08/19/2017 Clinical Impression Much improved swallow function compared to prior MBS. Mild oral transit delay/hesitation to increase oral control. Mild motor  based pharyngeal dysphagia marked by incomplete epiglottic inversion caused by Cortrak tube leading to mild  residue (mod after solid). Sensed aspiration of thin using straw suspect from residue in pyriform sinuses (difficult to fully view). Functional timing and coordination of swallow initiation. Esophageal scan without overt findings upon brief observation. Recommend thin liquids, no straws Dys 2 (ground- pt preference), pills whole in puree, sit upright and small bites/sips. Continue ST intervention.  SLP Visit Diagnosis Dysphagia, oropharyngeal phase (R13.12) Attention and concentration deficit following -- Frontal lobe and executive function deficit following -- Impact on safety and function (No Data)   CHL IP TREATMENT RECOMMENDATION 08/19/2017 Treatment Recommendations Therapy as outlined in treatment plan below   Prognosis 08/19/2017 Prognosis for Safe Diet Advancement Good Barriers to Reach Goals -- Barriers/Prognosis Comment -- CHL IP DIET RECOMMENDATION 08/19/2017 SLP Diet Recommendations Dysphagia 2 (Fine chop) solids;Thin liquid Liquid Administration via Cup;No straw Medication Administration Whole meds with puree Compensations Slow rate;Small sips/bites Postural Changes Seated upright at 90 degrees   CHL IP OTHER RECOMMENDATIONS 08/19/2017 Recommended Consults -- Oral Care Recommendations Oral care BID Other Recommendations --   CHL IP FOLLOW UP RECOMMENDATIONS 08/19/2017 Follow up Recommendations Inpatient Rehab   CHL IP FREQUENCY AND DURATION 08/19/2017 Speech Therapy Frequency (ACUTE ONLY) min 2x/week Treatment Duration 2 weeks      CHL IP ORAL PHASE 08/19/2017 Oral Phase Impaired Oral - Pudding Teaspoon -- Oral - Pudding Cup -- Oral - Honey Teaspoon NT Oral - Honey Cup Delayed oral transit Oral - Nectar Teaspoon -- Oral - Nectar Cup Delayed oral transit Oral - Nectar Straw -- Oral - Thin Teaspoon -- Oral - Thin Cup Delayed oral transit Oral - Thin Straw -- Oral - Puree NT Oral - Mech Soft -- Oral - Regular -- Oral - Multi-Consistency -- Oral - Pill -- Oral Phase - Comment --  CHL IP PHARYNGEAL PHASE  08/19/2017 Pharyngeal Phase Impaired Pharyngeal- Pudding Teaspoon -- Pharyngeal -- Pharyngeal- Pudding Cup -- Pharyngeal -- Pharyngeal- Honey Teaspoon NT Pharyngeal -- Pharyngeal- Honey Cup Pharyngeal residue - valleculae;Reduced epiglottic inversion Pharyngeal -- Pharyngeal- Nectar Teaspoon -- Pharyngeal -- Pharyngeal- Nectar Cup Pharyngeal residue - valleculae;Reduced epiglottic inversion Pharyngeal -- Pharyngeal- Nectar Straw -- Pharyngeal -- Pharyngeal- Thin Teaspoon -- Pharyngeal -- Pharyngeal- Thin Cup Pharyngeal residue - valleculae;Pharyngeal residue - pyriform;Reduced epiglottic inversion;Reduced laryngeal elevation Pharyngeal -- Pharyngeal- Thin Straw Penetration/Aspiration during swallow;Pharyngeal residue - valleculae;Reduced epiglottic inversion Pharyngeal Material enters airway, passes BELOW cords and not ejected out despite cough attempt by patient Pharyngeal- Puree NT Pharyngeal -- Pharyngeal- Mechanical Soft -- Pharyngeal -- Pharyngeal- Regular Pharyngeal residue - valleculae;Reduced epiglottic inversion Pharyngeal -- Pharyngeal- Multi-consistency -- Pharyngeal -- Pharyngeal- Pill -- Pharyngeal -- Pharyngeal Comment --  CHL IP CERVICAL ESOPHAGEAL PHASE 08/19/2017 Cervical Esophageal Phase WFL Pudding Teaspoon -- Pudding Cup -- Honey Teaspoon -- Honey Cup -- Nectar Teaspoon -- Nectar Cup -- Nectar Straw -- Thin Teaspoon -- Thin Cup -- Thin Straw -- Puree -- Mechanical Soft -- Regular -- Multi-consistency -- Pill -- Cervical Esophageal Comment -- No flowsheet data found. Houston Siren 08/19/2017, 11:16 AM  Orbie Pyo Colvin Caroli.Ed CCC-SLP Pager [phone]              Recent Labs  08/20/17 2111  WBC 7.3  HGB 9.0*  HCT 28.4*  PLT 216    Recent Labs  08/20/17 2111  NA 134*  K 3.6  CL 104  GLUCOSE 117*  BUN 16  CREATININE 0.97  CALCIUM 7.8*   CBG (last  3)   Recent Labs  08/20/17 0856 08/20/17 1242 08/20/17 1553  GLUCAP 133* 165* 94    Wt Readings from Last 3  Encounters:  08/21/17 80.9 kg (178 lb 5.6 oz)  08/20/17 81 kg (178 lb 9.2 oz)  07/28/17 85.7 kg (189 lb)    Physical Exam:  BP (!) 125/42 (BP Location: Right Leg)   Pulse 69   Temp 98.3 F (36.8 C) (Oral)   Resp 18   Wt 80.9 kg (178 lb 5.6 oz)   SpO2 95%   BMI 31.85 kg/m  Constitutional: She appears well-developed and well-nourished.  HENT: Normocephalic and atraumatic.  Eyes: EOM are normal. No discharge.  Cardiovascular: Normal rate and regular rhythm.  No JVD. Respiratory: Effort normal and breath sounds normal.  GI: Bowel sounds are normal. She exhibits no distension.  Musculoskeletal: She exhibits no edema or tenderness.  Neurological: She is alert and oriented.  Motor: B/l UE 4/5 proximal to distal B/l LE: 4-/5 proximal to distal   Skin: Skin is warm and dry. Cyst, now with eschar posterior head Psychiatric: She has a normal mood and affect. Her behavior is normal.    Assessment/Plan: 1. Functional deficits secondary to debility which require 3+ hours per day of interdisciplinary therapy in a comprehensive inpatient rehab setting. Physiatrist is providing close team supervision and 24 hour management of active medical problems listed below. Physiatrist and rehab team continue to assess barriers to discharge/monitor patient progress toward functional and medical goals.  Function:  Bathing Bathing position      Bathing parts      Bathing assist        Upper Body Dressing/Undressing Upper body dressing                    Upper body assist        Lower Body Dressing/Undressing Lower body dressing                                  Lower body assist        Toileting Toileting          Toileting assist     Transfers Chair/bed transfer             Locomotion Ambulation           Wheelchair          Cognition Comprehension    Expression    Social Interaction    Problem Solving    Memory      Medical Problem List  and Plan: 1.  Debilitation secondary to respiratory failure after VATS procedure with right upper lobe wedge resection with biopsy positive for adenocarcinoma  Begin CIR 2.  DVT Prophylaxis/Anticoagulation:  Subcutaneous  Lovenox. Monitor for any bleeding episodes 3. Pain Management: Tylenol as needed 4. Mood:  Klonopin 0.5 mg twice a day as needed anxiety, Risperdal 0.5 mg twice a day as needed agitation 5. Neuropsych: This patient is capable of making decisions on her own behalf. 6. Skin/Wound Care: Routine skin checks 7. Fluids/Electrolytes/Nutrition: Routine I&Os 8. Dysphagia. Dysphagia #2 thin liquids.   Advance as tolerated 9. Acute blood loss anemia.   Hb 9.0 on 10/17  Labs ordered for tomorrow 10. Pneumothorax. Chest tube removed 11. Hypertension. Norvasc 10 mg daily, clonidine 0.1 mg twice a day, Lopressor 12.5 mg twice a day.   Monitor with increased mobility 12. PAF. Amiodarone 200 mg daily.  13. History of CVA September 2017. Status post loop recorder 14. Hyperlipidemia. Lipitor 15. Tobacco abuse. Counseling 16. Hypernatremia, now with hyponatremia:   Na+ 134 on 10/17  Labs ordered for tomorrow 17. Leukocytosis: Resolved  WBCs 7.3 on 10/17 18. Hypoalbuminemia  Supplement initiated 10/18 19. Bowel incontinence  Bowel program initated  LOS (Days) 1 A FACE TO FACE EVALUATION WAS PERFORMED  Ankit Lorie Phenix 08/21/2017 8:12 AM

## 2017-08-21 NOTE — Progress Notes (Signed)
Patient information reviewed and entered into eRehab system by Daiva Nakayama, RN, CRRN, [place] Coordinator.  Information including medical coding and functional independence measure will be reviewed and updated through discharge.     Per nursing patient was given "Data Collection Information Summary for Patients in Inpatient Rehabilitation Facilities with attached "Privacy Act [place] Records" upon admission.  Present in Education notebook.

## 2017-08-21 NOTE — Evaluation (Signed)
Speech Language Pathology Assessment and Plan  Patient Details  Name: Kathleen [facility] MRN: [id number] Date of Birth: [birth year]  SLP Diagnosis: Dysphagia;Cognitive Impairments  Rehab Potential: Excellent ELOS: 7-10 days     Today's Date: 08/21/2017 SLP Individual Time: 1340-1435 SLP Individual Time Calculation (min): 55 min   Problem List:  Patient Active Problem List   Diagnosis Date Noted  . Hyponatremia   . Hypoalbuminemia due to protein-calorie malnutrition ([place])   . Incontinence of feces   . Critical illness myopathy 08/20/2017  . Debility   . Adenocarcinoma ([place])   . Anxiety state   . Agitation   . Dysphagia   . Acute blood loss anemia   . History of pneumothorax   . PAF (paroxysmal atrial fibrillation) ([place])   . History of CVA (cerebrovascular accident)   . Hyperlipidemia   . Hypernatremia   . Leukocytosis   . S/P lobectomy of lung 07/30/2017  . Lower extremity edema 04/30/2017  . TIA (transient ischemic attack) 01/16/2017  . Dyslipidemia, goal LDL below 70 01/03/2017  . Diarrhea 09/12/2016  . Sleep disturbance 07/26/2016  . Overactive bladder 07/26/2016  . Tobacco abuse 07/21/2016  . Solitary pulmonary nodule 07/21/2016  . Acute CVA (cerebrovascular accident) ([place]) 07/21/2016  . Acute lacunar stroke ([place]) 07/20/2016  . Hypokalemia 07/20/2016  . Benign essential HTN 07/20/2016   Past Medical History:  Past Medical History:  Diagnosis Date  . Arthritis   . GERD (gastroesophageal reflux disease)    tums  . Headache   . Hepatitis    cmv 30 yrs ago hepatitis  . History of hiatal hernia   . History of loop recorder    after stroke  . Hypertension   . Stroke ([facility]) 07/2016   balance a little off  . Tobacco use    Past Surgical History:  Past Surgical History:  Procedure Laterality Date  . APPENDECTOMY    . DERMOID CYST  EXCISION     x2 35'  . EP IMPLANTABLE DEVICE N/A 07/23/2016   Procedure: Loop Recorder Insertion;  Surgeon: Thompson Grayer, MD;   Location: [facility];  Service: Cardiovascular;  Laterality: N/A;  . Intestinal obstruction  1979  . LOBECTOMY Right 07/30/2017   Procedure: LOBECTOMY;  Surgeon: [name], MD;  Location: [place];  Service: Thoracic;  Laterality: Right;  . TEE WITHOUT CARDIOVERSION N/A 07/23/2016   Procedure: TRANSESOPHAGEAL ECHOCARDIOGRAM (TEE);  Surgeon: Lelon Perla, MD;  Location: [facility]OSCOPY;  Service: Cardiovascular;  Laterality: N/A;  . VIDEO ASSISTED THORACOSCOPY (VATS)/WEDGE RESECTION Right 07/30/2017   Procedure: VIDEO ASSISTED THORACOSCOPY (VATS)/WEDGE RESECTION;  Surgeon: [name], MD;  Location: [facility] OR;  Service: Thoracic;  Laterality: Right;    Assessment / Plan / Recommendation Clinical Impression Patient is a 71 y.o.right handed femalewith past medical history of hypertension, hepatitis, tobacco abuse and CVA status post loop recorder September 2017. Per chart review patient lives alone independently prior to admission and still working. One level home 6 steps to entry. As part of patient's workup from CVA 2017 she had a CT of her neck that showed right upper lobe groundglass opacity. CT the chest showed a 13 x 9 x 8 mm opacity and follow-up recommended. She was seen outpatient by Dr. Melvyn Novas with follow-up CT in November in March showing no significant change. Recently another follow-up scan showed increase in size to 13 x 20 mm. She was admitted 07/17/2017 for ongoing evaluation and care with right upper lobe wedge resection biopsy  adenocarcinoma. Underwent VATS, thoracic right upper lobectomy, lymph node dissection 07/30/2017 per Dr. Roxan Hockey. Hospital course pain management. On 08/01/2017 patient developed acute respiratory distress accompanied by crepitus over her right chest wall and was intubated. Chest x-ray showed small apical pneumothorax. A chest tube was later placed 08/03/2017 and has since been removed. Patient later extubated 08/11/2017. CT head on 08/05/17  reviewed, showing air.  Hospital course acute blood loss anemia and monitored. Initially with nasogastric tube feeds modified barium swallow completed diet has been advanced to dysphagia #2 thin liquids and nasogastric tube removed. Subcutaneous Lovenox added for DVT prophylaxis. Physical and occupational therapy evaluation completed 08/12/2017 with recommendations of physical medicine rehabilitation consult. Patient was admitted for a comprehensive rehabilitation program 08/20/17.  Patient demonstrates mild cognitive impairments impacting recall and complex problem solving which impacts her overall functional independence with basic and familiar tasks. Patient also demonstrates a mild hoarse vocal quality, however, speech intelligibility is not impacted. Patient consumed trials of thin liquids via straw with what appeared to be a suspected delayed swallow initiation without overt s/s of aspiration, therefore, recommend patient utilize straws. Patient also demonstrated efficient mastication with trials of Dys. 3 textures with mildly delayed oral transit. Recommend patient continue Dys. 2 textures with trial tray of Dys. 3 textures prior to upgrade. Patient would benefit from skilled SLP intervention to maximize her cognitive and swallowing function and overall functional independence prior to discharge.    Skilled Therapeutic Interventions          Administered a BSE and cognitive-linguistic evaluation. Please see above for details.   SLP Assessment  Patient will need skilled Speech Lanaguage Pathology Services during CIR admission    Recommendations  SLP Diet Recommendations: Dysphagia 2 (Fine chop);Thin Liquid Administration via: Cup;Straw Medication Administration: Whole meds with liquid Supervision: Patient able to self feed Compensations: Slow rate;Small sips/bites Postural Changes and/or Swallow Maneuvers: Seated upright 90 degrees Oral Care Recommendations: Oral care BID Patient destination:  Home Follow up Recommendations: Outpatient SLP (TBD) Equipment Recommended: None recommended by SLP    SLP Frequency 3 to 5 out of 7 days   SLP Duration  SLP Intensity  SLP Treatment/Interventions 7-10 days   Minumum of 1-2 x/day, 30 to 90 minutes  Cognitive remediation/compensation;Cueing hierarchy;Functional tasks;Environmental controls;Internal/external aids;Therapeutic Activities;Patient/family education    Pain Pain Assessment Pain Assessment: No/denies pain  Prior Functioning    Function:  Eating Eating   Modified Consistency Diet: Yes Eating Assist Level: More than reasonable amount of time;Set up assist for;Supervision or verbal cues   Eating Set Up Assist For: Opening containers       Cognition Comprehension Comprehension assist level: Understands complex 90% of the time/cues 10% of the time  Expression   Expression assist level: Expresses complex 90% of the time/cues < 10% of the time  Social Interaction Social Interaction assist level: Interacts appropriately 90% of the time - Needs monitoring or encouragement for participation or interaction.  Problem Solving Problem solving assist level: Solves basic 75 - 89% of the time/requires cueing 10 - 24% of the time  Memory Memory assist level: Recognizes or recalls 75 - 89% of the time/requires cueing 10 - 24% of the time   Short Term Goals: Week 1: SLP Short Term Goal 1 (Week 1): Patient will perform 25 repetitions of EMST exercises with Mod I and a self-perceived effort level of 7/10.  SLP Short Term Goal 2 (Week 1): Patient will consume trials of Dys. 3 textures with efficient mastication and without overt  s/s of aspiration with Mod I.  SLP Short Term Goal 3 (Week 1): Patient will demonstrate functional problem solving for mildly complex tasks with Mod I.  SLP Short Term Goal 4 (Week 1): Patient will recall new, daily information with Mod I.   Refer to Care Plan for Long Term Goals  Recommendations for other  services: Neuropsych  Discharge Criteria: Patient will be discharged from SLP if patient refuses treatment 3 consecutive times without medical reason, if treatment goals not met, if there is a change in medical status, if patient makes no progress towards goals or if patient is discharged from hospital.  The above assessment, treatment plan, treatment alternatives and goals were discussed and mutually agreed upon: by patient  Emelie Newsom 08/21/2017, 3:23 PM

## 2017-08-21 NOTE — Progress Notes (Signed)
Initial Nutrition Assessment  DOCUMENTATION CODES:   Obesity unspecified  INTERVENTION:  Continue 30 ml Prostat po BID, each supplement provides 100 kcal and 15 grams of protein.   Encourage adequate PO intake.   NUTRITION DIAGNOSIS:   Increased nutrient needs related to  (therapy) as evidenced by estimated needs.  GOAL:   Patient will meet greater than or equal to 90% of their needs  MONITOR:   PO intake, Supplement acceptance, Diet advancement, Labs, Weight trends, Skin, I & O's  REASON FOR ASSESSMENT:   Consult Assessment of nutrition requirement/status  ASSESSMENT:   71 y.o.right handed female with past medical history of hypertension, hepatitis, tobacco abuse and CVA status post loop recorder September 2017.  On 08/01/2017 patient developed acute respiratory distress accompanied by crepitus over her right chest wall and was intubated. Chest x-ray showed small apical pneumothorax. A chest tube was later placed 08/03/2017 and has since been removed. Patient later extubated 08/11/2017. Initially with nasogastric tube feeds modified barium swallow completed diet has been advanced to dysphagia #2 thin liquids and nasogastric tube removed.  Pt reports having a good appetite currently and reports liking her food at meals. Meal completion has been 75%. Pt reports eating well PTA with no other difficulties. Pt with weight loss per weight records, likely related to fluid status. Pt currently has Prostat ordered and has been consuming them. RD to continue with current orders. Pt educated on the importance of adequate protein intake. Pt with no observed significant fat or muscle mass loss.   Labs and medications reviewed.   Diet Order:  DIET DYS 2 Room service appropriate? Yes; Fluid consistency: Thin  Skin:   (Wound on head, incision on chest)  Last BM:  10/16  Height:   Ht Readings from Last 1 Encounters:  07/31/17 5' 2.75" (1.594 m)    Weight:   Wt Readings from Last 1  Encounters:  08/21/17 178 lb 5.6 oz (80.9 kg)    Ideal Body Weight:  51.7 kg  BMI:  Body mass index is 31.85 kg/m.  Estimated Nutritional Needs:   Kcal:  1600-1800  Protein:  85-100 grams  Fluid:  >/= 1.6 L/day  EDUCATION NEEDS:   Education needs addressed  Corrin Parker, MS, RD, LDN Pager # [phone] After hours/ weekend pager # [phone]

## 2017-08-21 NOTE — Plan of Care (Signed)
Problem: RH BLADDER ELIMINATION Goal: RH STG MANAGE BLADDER WITH ASSISTANCE STG Manage Bladder With max  Assistance  Outcome: Not Progressing Total assistance-incontinent  Problem: RH SKIN INTEGRITY Goal: RH STG ABLE TO PERFORM INCISION/WOUND CARE W/ASSISTANCE STG Able To Perform Incision/Wound Care With Assistance.  Outcome: Not Progressing Total assist

## 2017-08-21 NOTE — Care Management Note (Signed)
[facility] Individual Statement of Services  Patient Name:  [name]  Date:  08/21/2017  Welcome to the [facility].  Our goal is to provide you with an individualized program based on your diagnosis and situation, designed to meet your specific needs.  With this comprehensive rehabilitation program, you will be expected to participate in at least 3 hours of rehabilitation therapies Monday-Friday, with modified therapy programming on the weekends.  Your rehabilitation program will include the following services:  Physical Therapy (PT), Occupational Therapy (OT), 24 hour per day rehabilitation nursing, Case Management (Social Worker), Rehabilitation Medicine, Nutrition Services and Pharmacy Services  Weekly team conferences will be held on Wednesday to discuss your progress.  Your Social Worker will talk with you frequently to get your input and to update you on team discussions.  Team conferences with you and your family in attendance may also be held.  Expected length of stay: 7-10 days Overall anticipated outcome: mod/i-supervision level  Depending on your progress and recovery, your program may change. Your Social Worker will coordinate services and will keep you informed of any changes. Your Social Worker's name and contact numbers are listed  below.  The following services may also be recommended but are not provided by the [facility] will be made to provide these services after discharge if needed.  Arrangements include referral to agencies that provide these services.  Your insurance has been verified to be:  [place] primary doctor is:  Mauricio Po  Pertinent information will be shared with your doctor and your insurance company.  Social Worker:  Kathleen Guerrero, [place] or ([phone]  Information discussed with and copy given to patient by: Kathleen Guerrero, 08/21/2017, 9:39 AM

## 2017-08-21 NOTE — Progress Notes (Signed)
Incontinent of urine during night. MASD to sacrum-gerhardt's butt cream applied. Bilateral groin red. Right upper flank with steri strips and area with 1 suture, 1 suture to chest and post scalp with round, silver dollar sized area with dark center. Patient reports it's an old cyst. Foam dressing in hair, didn't want removed R/T discomfort pulling hair, will attempt again in AM. A & O x4, obvious memory deficits. Kathleen Guerrero A

## 2017-08-21 NOTE — Progress Notes (Signed)
Social Work Assessment and Plan Social Work Assessment and Plan  Patient Details  Name: Kathleen Guerrero MRN: [id number] Date of Birth: 1946/04/03  Today's Date: 08/21/2017  Problem List:  Patient Active Problem List   Diagnosis Date Noted  . Hyponatremia   . Hypoalbuminemia due to protein-calorie malnutrition ([place])   . Incontinence of feces   . Critical illness myopathy 08/20/2017  . Debility   . Adenocarcinoma ([place])   . Anxiety state   . Agitation   . Dysphagia   . Acute blood loss anemia   . History of pneumothorax   . PAF (paroxysmal atrial fibrillation) ([place])   . History of CVA (cerebrovascular accident)   . Hyperlipidemia   . Hypernatremia   . Leukocytosis   . S/P lobectomy of lung 07/30/2017  . Lower extremity edema 04/30/2017  . TIA (transient ischemic attack) 01/16/2017  . Dyslipidemia, goal LDL below 70 01/03/2017  . Diarrhea 09/12/2016  . Sleep disturbance 07/26/2016  . Overactive bladder 07/26/2016  . Tobacco abuse 07/21/2016  . Solitary pulmonary nodule 07/21/2016  . Acute CVA (cerebrovascular accident) ([place]) 07/21/2016  . Acute lacunar stroke ([place]) 07/20/2016  . Hypokalemia 07/20/2016  . Benign essential HTN 07/20/2016   Past Medical History:  Past Medical History:  Diagnosis Date  . Arthritis   . GERD (gastroesophageal reflux disease)    tums  . Headache   . Hepatitis    cmv 30 yrs ago hepatitis  . History of hiatal hernia   . History of loop recorder    after stroke  . Hypertension   . Stroke ([place]) 07/2016   balance a little off  . Tobacco use    Past Surgical History:  Past Surgical History:  Procedure Laterality Date  . APPENDECTOMY    . DERMOID CYST  EXCISION     x2 107'  . EP IMPLANTABLE DEVICE N/A 07/23/2016   Procedure: Loop Recorder Insertion;  Surgeon: Thompson Grayer, MD;  Location: [facility];  Service: Cardiovascular;  Laterality: N/A;  . Intestinal obstruction  1979  . LOBECTOMY Right 07/30/2017   Procedure: LOBECTOMY;   Surgeon: [name], MD;  Location: [facility];  Service: Thoracic;  Laterality: Right;  . TEE WITHOUT CARDIOVERSION N/A 07/23/2016   Procedure: TRANSESOPHAGEAL ECHOCARDIOGRAM (TEE);  Surgeon: Lelon Perla, MD;  Location: [facility] ENDOSCOPY;  Service: Cardiovascular;  Laterality: N/A;  . VIDEO ASSISTED THORACOSCOPY (VATS)/WEDGE RESECTION Right 07/30/2017   Procedure: VIDEO ASSISTED THORACOSCOPY (VATS)/WEDGE RESECTION;  Surgeon: [name], MD;  [facility];  Service: Thoracic;  Laterality: Right;   Social History:  reports that she quit smoking about 13 months ago. Her smoking use included Cigarettes. She has a 50.00 pack-year smoking history. She has never used smokeless tobacco. She reports that she drinks alcohol. She reports that she does not use drugs.  Family / Support Systems Marital Status: Divorced Patient Roles: Parent, Other (Comment) (retiree) Children: Kathryn-daughter [phone] Other Supports: Malachy Mood cousin and Lesley-friend Anticipated Caregiver: Malachy Mood or Lolita Patella Ability/Limitations of Caregiver: Both are in and out, pt has not plans for them to provide 24 hr care of her at discharge Caregiver Availability: Other (Comment) (Awaiting goals and coming up with a plan) Family Dynamics: Close with her daughter who is here from [place] for a short time, but plans to go back. She has extended family and friends who are supportive and visit her in the hospital. She plans to be close to independent when she leaves here.  Social History Preferred language:  English Religion: Non-Denominational Cultural Background: No issues Education: High School Read: Yes Write: Yes Employment Status: Retired Date Retired/Disabled/Unemployed: Armed forces operational officer Issues: No issues Guardian/Conservator: None-according to MD pt is capable of making her own decisions while here.   Abuse/Neglect Physical Abuse: Denies Verbal Abuse: Denies Sexual  Abuse: Denies Exploitation of patient/patient's resources: Denies Self-Neglect: Denies  Emotional Status Pt's affect, behavior adn adjustment status: Pt is motivated and feels she need sto get stronger while here. She recovered from her stroke and is hopeful she will recover from this also. She is pleased her lymph nodes were clean and hopeful no more treatment is needed. She is on the home stretch. Recent Psychosocial Issues: other health issues-past CVA in 2017 which she recovered from and attended PT at [facility] for Pyschiatric History: No history deferred depression screen at this time is still adjusting to the rehab unit and preparing for therapy today. This worker does feel pt would benefit from seeing neuro-psych while here due to two major life events have happened in two years. Will get input from therapy team also. Substance Abuse History: Quit tobacco last year  Patient / Family Perceptions, Expectations & Goals Pt/Family understanding of illness & functional limitations: Pt and daughter can explain her surgery and complications which resulted. They both talk with the MD's and feel their questions have been answered and concerns addressed. Pt is not one to not ask her questions if they arise. Premorbid pt/family roles/activities: Mother, retiree, friend, cousin, church member, etc Anticipated changes in roles/activities/participation: resume Pt/family expectations/goals: Pt states: " I plan to be able to take care of myself by the time I leave here. I want to be driving."  Daughter states: " I hope she can be mobile and doing her own care."  [employer]: Other (Comment) (Went to Neuro OPPT after CVA) Premorbid Home Care/DME Agencies: Other (Comment) (has DME from last year CVA) Transportation available at discharge: Family and friends-pt wants to drive if she can Resource referrals recommended: Support group (specify)  Discharge Planning Living  Arrangements: Alone Support Systems: Children, Other relatives, Friends/neighbors Type of Residence: Private residence Insurance Resources: Multimedia programmer (specify) Scientist, clinical (histocompatibility and immunogenetics) Medicare) Financial Resources: [place] Referred: No Living Expenses: Own Money Management: Patient Does the patient have any problems obtaining your medications?: No Home Management: Self Patient/Family Preliminary Plans: Plans to go to either [place] or Lesley's home upon dishcarge so someone can be there with her. Pt and daughter are hopeful she will be doing well by the time she leaves here and will not need much care. Will await therapy evaluations and work on a safe discharge plan. [place] Barriers to Discharge: Decreased caregiver support [place] Barriers to Discharge Comments: Will not have 24 hr care-pt reports both are in and out Social Work Anticipated Follow Up Needs: HH/OP, Support Group  Clinical Impression Pleasant female who is motivated to do well here and regain as much function as she can. She feels she needs to get stronger from the surgery and then will be able to get to the place she can manage at home after going to  Friend's or cousin's home at discharge from rehab. She wants to be able to drive again and soon. Daughter is here visiting from [place] for a short time but will need to go back. Will await team's evaluations and work on discharge needs. Do feel pt would benefit from seeing neuro-psych while here.  Elease Hashimoto 08/21/2017, 11:09 AM

## 2017-08-21 NOTE — Plan of Care (Signed)
Problem: RH SKIN INTEGRITY Goal: RH STG SKIN FREE OF INFECTION/BREAKDOWN Outcome: Progressing WITH MIN. ASSIST.  Problem: RH SAFETY Goal: RH STG DEMO UNDERSTANDING HOME SAFETY PRECAUTIONS Outcome: Progressing WITH MIN. ASSIST.

## 2017-08-21 NOTE — Evaluation (Signed)
Occupational Therapy Assessment and Plan  Patient Details  Name: NADEZHDA POLLITT MRN: [id number] Date of Birth: 01-05-1946  OT Diagnosis: abnormal posture and muscle weakness (generalized) Rehab Potential: Rehab Potential (ACUTE ONLY): Excellent ELOS: 7-10 days   Today's Date: 08/21/2017 OT Individual Time: 1004-1100 OT Individual Time Calculation (min): 56 min     Problem List:  Patient Active Problem List   Diagnosis Date Noted  . Hyponatremia   . Hypoalbuminemia due to protein-calorie malnutrition ([place])   . Incontinence of feces   . Critical illness myopathy 08/20/2017  . Debility   . Adenocarcinoma ([place])   . Anxiety state   . Agitation   . Dysphagia   . Acute blood loss anemia   . History of pneumothorax   . PAF (paroxysmal atrial fibrillation) ([place])   . History of CVA (cerebrovascular accident)   . Hyperlipidemia   . Hypernatremia   . Leukocytosis   . S/P lobectomy of lung 07/30/2017  . Lower extremity edema 04/30/2017  . TIA (transient ischemic attack) 01/16/2017  . Dyslipidemia, goal LDL below 70 01/03/2017  . Diarrhea 09/12/2016  . Sleep disturbance 07/26/2016  . Overactive bladder 07/26/2016  . Tobacco abuse 07/21/2016  . Solitary pulmonary nodule 07/21/2016  . Acute CVA (cerebrovascular accident) ([place]) 07/21/2016  . Acute lacunar stroke ([place]) 07/20/2016  . Hypokalemia 07/20/2016  . Benign essential HTN 07/20/2016    Past Medical History:  Past Medical History:  Diagnosis Date  . Arthritis   . GERD (gastroesophageal reflux disease)    tums  . Headache   . Hepatitis    cmv 30 yrs ago hepatitis  . History of hiatal hernia   . History of loop recorder    after stroke  . Hypertension   . Stroke ([place]) 07/2016   balance a little off  . Tobacco use    Past Surgical History:  Past Surgical History:  Procedure Laterality Date  . APPENDECTOMY    . DERMOID CYST  EXCISION     x2 47'  . EP IMPLANTABLE DEVICE N/A 07/23/2016   Procedure: Loop Recorder  Insertion;  Surgeon: Thompson Grayer, MD;  Location: [facility];  Service: Cardiovascular;  Laterality: N/A;  . Intestinal obstruction  1979  . LOBECTOMY Right 07/30/2017   Procedure: LOBECTOMY;  Surgeon: [name], MD;  Location: [facility];  Service: Thoracic;  Laterality: Right;  . TEE WITHOUT CARDIOVERSION N/A 07/23/2016   Procedure: TRANSESOPHAGEAL ECHOCARDIOGRAM (TEE);  Surgeon: Lelon Perla, MD;  Location: [facility] ENDOSCOPY;  Service: Cardiovascular;  Laterality: N/A;  . VIDEO ASSISTED THORACOSCOPY (VATS)/WEDGE RESECTION Right 07/30/2017   Procedure: VIDEO ASSISTED THORACOSCOPY (VATS)/WEDGE RESECTION;  Surgeon: [name], MD;  Location: [facility] OR;  Service: Thoracic;  Laterality: Right;    Assessment & Plan Clinical Impression: Patient is a 71 y.o. year old female with recent admission to the hospital on 07/17/2017 for ongoing evaluation and care with right upper lobe wedge resection biopsy adenocarcinoma. Underwent VATS, thoracic right upper lobectomy, lymph node dissection 07/30/2017 per Dr. Roxan Hockey. Hospital course pain management. On 08/01/2017 patient developed acute respiratory distress accompanied by crepitus over her right chest wall and was intubated. Chest x-ray showed small apical pneumothorax. A chest tube was later placed 08/03/2017 and has since been removed. Patient later extubated 08/11/2017. CT head on 08/05/17 reviewed, showing air.  Hospital course acute blood loss anemia and monitored.  Patient transferred to CIR on 08/20/2017 .    Patient currently requires min with basic self-care skills secondary  to muscle weakness, decreased problem solving and decreased memory and decreased standing balance and decreased balance strategies.  Prior to hospitalization, patient could complete ADLs with modified independent .  Patient will benefit from skilled intervention to decrease level of assist with basic self-care skills and increase independence with basic  self-care skills prior to discharge home alone with PRN supervision from friends and family.  Anticipate patient will require intermittent supervision and follow up home health.  OT - End of Session Activity Tolerance: Decreased this session Endurance Deficit: Yes Endurance Deficit Description: Pt requesting to sit down after standing for less than 45 seconds when attempting to donn sports bra over her LEs.   OT Assessment Rehab Potential (ACUTE ONLY): Excellent OT Barriers to Discharge: Decreased caregiver support OT Patient demonstrates impairments in the following area(s): Balance;Cognition;Endurance OT Basic ADL's Functional Problem(s): Grooming;Bathing;Dressing;Toileting OT Advanced ADL's Functional Problem(s): Light Housekeeping;Simple Meal Preparation OT Transfers Functional Problem(s): Toilet;Tub/Shower OT Additional Impairment(s): None OT Plan OT Intensity: Minimum of 1-2 x/day, 45 to 90 minutes OT Frequency: 5 out of 7 days OT Duration/Estimated Length of Stay: 7-10 days OT Treatment/Interventions: Teacher, English as a foreign language;Discharge planning;Pain management;Self Care/advanced ADL retraining;Therapeutic Activities;Neuromuscular re-education;UE/LE Strength taining/ROM;Patient/family education;Functional mobility training;Therapeutic Exercise OT Self Feeding Anticipated Outcome(s): independent OT Basic Self-Care Anticipated Outcome(s): modified independent OT Toileting Anticipated Outcome(s): modified independent OT Bathroom Transfers Anticipated Outcome(s): modified independent to supervision OT Recommendation Patient destination: Home Follow Up Recommendations: Home health OT;Other (comment) (intermittent supervision)   Skilled Therapeutic Intervention Pt began working on selfcare retraining sit to stand shower level during session.  Min assist for transfer into the shower with use of the RW.  Pt needed mod instructional cueing for  sequencing bathing as she washed her hair, but then stated she was finished and did not recall that she hadn't bathed any other parts of her body.  After completion of all UB and upper legs she still needed cueing to wash her buttocks in standing.  Pt donned pullover gown with supervision but needed mod assist for donning gripper socks.  When donning sports bra pt needed min instructional cueing for orientation as well as mod assist in order to work it over her legs and then pull over her hips and trunk and then straighten out in the back.  Finished session with pt in wheelchair with call button and phone in reach at bedside.    OT Evaluation Precautions/Restrictions  Precautions Precautions: Fall Restrictions Weight Bearing Restrictions: No  Pain Pain Assessment Pain Assessment: No/denies pain Home Living/Prior Functioning Home Living Living Arrangements: Alone Available Help at Discharge: Available PRN/intermittently, Friend(s) Type of Home: Other(Comment) Home Access: Stairs to enter [employer] of Steps: 6 Entrance Stairs-Rails: Right, Left, Can reach both Home Layout: One level Bathroom Shower/Tub: Chiropodist: Standard Bathroom Accessibility: No Additional Comments: pt reports condo is very small and a walker would not fit well  Lives With: Alone IADL History Homemaking Responsibilities: Yes Meal Prep Responsibility: Primary Cleaning Responsibility: Primary Current License: Yes Occupation: Part time employment Prior Function Level of Independence: Independent with basic ADLs  Able to Take Stairs?: Yes Driving: Yes Vocation: Part time employment 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, also works at [employer] (~20 hrs/week total)  Comments: 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, retail ADL  See Function Section of chart for details Vision Baseline Vision/History: Wears  glasses Wears Glasses: Reading only Patient Visual Report: No change from baseline Vision  Assessment?: No apparent visual deficits Eye Alignment: Within Functional Limits Perception  Perception: Within Functional Limits Praxis Praxis: Intact Cognition Overall Cognitive Status: Impaired/Different from baseline Arousal/Alertness: Awake/alert Orientation Level: Person;Place;Situation Person: Oriented Place: Oriented Situation: Oriented Year: 2018 Month: October Day of Week: Correct Memory: Impaired Memory Impairment: Decreased recall of new information Immediate Memory Recall: Sock;Blue;Bed Memory Recall: Sock;Blue;Bed Memory Recall Sock: Without Cue Memory Recall Blue: Without Cue Memory Recall Bed: Without Cue Attention: Sustained;Focused Focused Attention: Appears intact Sustained Attention: Appears intact Awareness: Appears intact Problem Solving: Impaired Problem Solving Impairment: Functional complex;Functional basic Safety/Judgment: Appears intact Comments: Pt needed mod instructional cueing to sequence through bathing for thoroughness. Sensation Sensation Light Touch: Appears Intact Stereognosis: Appears Intact Hot/Cold: Appears Intact Proprioception: Appears Intact Coordination Gross Motor Movements are Fluid and Coordinated: Yes Fine Motor Movements are Fluid and Coordinated: Yes Motor  Motor Motor - Skilled Clinical Observations: generalized weakness Mobility  Transfers Transfers: Sit to Stand;Stand to Sit Sit to Stand: 4: Min assist;With upper extremity assist;From chair/3-in-1 Stand to Sit: 4: Min assist;With upper extremity assist;To chair/3-in-1  Trunk/Postural Assessment  Cervical Assessment Cervical Assessment: Within Functional Limits Thoracic Assessment Thoracic Assessment: Within Functional Limits Lumbar Assessment Lumbar Assessment: Within Functional Limits Postural Control Postural Control: Within Functional Limits   Balance Balance Balance Assessed: Yes Dynamic Sitting Balance Dynamic Sitting - Balance Support: No upper extremity supported;During functional activity Dynamic Sitting - Level of Assistance: 4: Min assist Static Standing Balance Static Standing - Balance Support: During functional activity Static Standing - Level of Assistance: 4: Min assist Dynamic Standing Balance Dynamic Standing - Balance Support: During functional activity Dynamic Standing - Level of Assistance: 4: Min assist Extremity/Trunk Assessment RUE Assessment RUE Assessment: Exceptions to [facility] (AROM WFLS with strength overall 3+/5, increased pain with shoulder flexion secondary to VATS wound) LUE Assessment LUE Assessment: Exceptions to WFL (AROM WFLS for all joints with strength at 3+/5 throughout)   See Function Navigator for Current Functional Status.   Refer to Care Plan for Long Term Goals  Recommendations for other services: None    Discharge Criteria: Patient will be discharged from OT if patient refuses treatment 3 consecutive times without medical reason, if treatment goals not met, if there is a change in medical status, if patient makes no progress towards goals or if patient is discharged from hospital.  The above assessment, treatment plan, treatment alternatives and goals were discussed and mutually agreed upon: by patient  Ascencion Stegner OTR/L 08/21/2017, 3:59 PM

## 2017-08-22 ENCOUNTER — Inpatient Hospital Stay (HOSPITAL_COMMUNITY): Payer: Medicare HMO | Admitting: Occupational Therapy

## 2017-08-22 ENCOUNTER — Telehealth: Payer: Self-pay

## 2017-08-22 ENCOUNTER — Inpatient Hospital Stay (HOSPITAL_COMMUNITY): Payer: Medicare HMO | Admitting: Speech Pathology

## 2017-08-22 ENCOUNTER — Inpatient Hospital Stay (HOSPITAL_COMMUNITY): Payer: Medicare HMO | Admitting: Physical Therapy

## 2017-08-22 ENCOUNTER — Encounter: Payer: Self-pay | Admitting: Cardiology

## 2017-08-22 DIAGNOSIS — R350 Frequency of micturition: Secondary | ICD-10-CM

## 2017-08-22 DIAGNOSIS — E870 Hyperosmolality and hypernatremia: Secondary | ICD-10-CM

## 2017-08-22 LAB — CBC WITH DIFFERENTIAL/PLATELET
BASOS PCT: 1 %
Basophils Absolute: 0 10*3/uL (ref 0.0–0.1)
EOS ABS: 0.2 10*3/uL (ref 0.0–0.7)
EOS PCT: 3 %
HCT: 34.1 % — ABNORMAL LOW (ref 36.0–46.0)
Hemoglobin: 11.2 g/dL — ABNORMAL LOW (ref 12.0–15.0)
LYMPHS ABS: 2 10*3/uL (ref 0.7–4.0)
Lymphocytes Relative: 28 %
MCH: 29.1 pg (ref 26.0–34.0)
MCHC: 32.8 g/dL (ref 30.0–36.0)
MCV: 88.6 fL (ref 78.0–100.0)
MONO ABS: 1.1 10*3/uL — AB (ref 0.1–1.0)
MONOS PCT: 16 %
Neutro Abs: 3.8 10*3/uL (ref 1.7–7.7)
Neutrophils Relative %: 52 %
Platelets: 227 10*3/uL (ref 150–400)
RBC: 3.85 MIL/uL — ABNORMAL LOW (ref 3.87–5.11)
RDW: 15.9 % — AB (ref 11.5–15.5)
WBC: 7.2 10*3/uL (ref 4.0–10.5)

## 2017-08-22 LAB — URINALYSIS, COMPLETE (UACMP) WITH MICROSCOPIC
Bilirubin Urine: NEGATIVE
GLUCOSE, UA: NEGATIVE mg/dL
HGB URINE DIPSTICK: NEGATIVE
KETONES UR: NEGATIVE mg/dL
NITRITE: NEGATIVE
PROTEIN: NEGATIVE mg/dL
Specific Gravity, Urine: 1.011 (ref 1.005–1.030)
pH: 6 (ref 5.0–8.0)

## 2017-08-22 LAB — BASIC METABOLIC PANEL
Anion gap: 10 (ref 5–15)
BUN: 16 mg/dL (ref 6–20)
CALCIUM: 8.4 mg/dL — AB (ref 8.9–10.3)
CHLORIDE: 104 mmol/L (ref 101–111)
CO2: 21 mmol/L — AB (ref 22–32)
CREATININE: 0.98 mg/dL (ref 0.44–1.00)
GFR calc Af Amer: >60 mL/min (ref >60)
GFR calc non Af Amer: 58 mL/min — ABNORMAL LOW (ref >60)
GLUCOSE: 107 mg/dL — AB (ref 65–99)
Potassium: 3.5 mmol/L (ref 3.5–5.1)
Sodium: 135 mmol/L (ref 135–145)

## 2017-08-22 MED ORDER — LOPERAMIDE HCL 2 MG PO CAPS
2.0000 mg | ORAL_CAPSULE | Freq: Four times a day (QID) | ORAL | Status: DC | PRN
  Administered 2017-08-22 – 2017-08-28 (×9): 2 mg via ORAL
  Filled 2017-08-22 (×9): qty 1

## 2017-08-22 NOTE — Progress Notes (Signed)
Speech Language Pathology Daily Session Note  Patient Details  Name: Kathleen Guerrero MRN: [id number] Date of Birth: 1946-10-31  Today's Date: 08/22/2017 SLP Individual Time: 1330-1430 SLP Individual Time Calculation (min): 60 min  Short Term Goals: Week 1: SLP Short Term Goal 1 (Week 1): Patient will perform 25 repetitions of EMST exercises with Mod I and a self-perceived effort level of 7/10.  SLP Short Term Goal 2 (Week 1): Patient will consume trials of Dys. 3 textures with efficient mastication and without overt s/s of aspiration with Mod I.  SLP Short Term Goal 3 (Week 1): Patient will demonstrate functional problem solving for mildly complex tasks with Mod I.  SLP Short Term Goal 4 (Week 1): Patient will recall new, daily information with Mod I.   Skilled Therapeutic Interventions: Skilled treatment session focused on cognitive goals. SLP facilitated session by administering the MoCA (version 7.1). Patient scored 21/30 points with a score of 26 or above considered normal. Patient demonstrates impairments in executive functioning, attention, recall and problem solving. Patient also ambulated to the bathroom and was continent of bowel X 2. Patient left upright in bed with all needs within reach. Continue with current plan of care.      Function:  Eating Eating   Modified Consistency Diet: Yes Eating Assist Level: More than reasonable amount of time   Eating Set Up Assist For: Opening containers       Cognition Comprehension Comprehension assist level: Understands complex 90% of the time/cues 10% of the time  Expression   Expression assist level: Expresses complex 90% of the time/cues < 10% of the time  Social Interaction Social Interaction assist level: Interacts appropriately 90% of the time - Needs monitoring or encouragement for participation or interaction.  Problem Solving Problem solving assist level: Solves basic 75 - 89% of the time/requires cueing 10 - 24% of the time   Memory Memory assist level: Recognizes or recalls 75 - 89% of the time/requires cueing 10 - 24% of the time    Pain No/Denies Pain   Therapy/Group: Individual Therapy  Rockie Schnoor 08/22/2017, 3:01 PM

## 2017-08-22 NOTE — Progress Notes (Addendum)
Occupational Therapy Session Note  Patient Details  Name: Kathleen Guerrero MRN: [id number] Date of Birth: 02/27/1946  Today's Date: 08/22/2017 OT Individual Time: 7164-0890 OT Individual Time Calculation (min): 45 min    Short Term Goals: Week 1:  OT Short Term Goal 1 (Week 1): STGs equal to LTGs based on ELOS.       Skilled Therapeutic Interventions/Progress Updates:    Upon entering the room, pt stated she had to urgently use the bathroom. Used RW to ambulate to toilet and sit down with steadying A.   Close S with standing for clothing management.  completed toileting and stated that she was totally exhausted and couldn't tolerate anymore standing.  Discussed continuing therapy from bed level. Pt ambulated to bed and needed 4 pillows.  Provided pt with pillows and cases. She was able to place first cotton pillow case without assist. The other 3 cases were different style cases with small extra piece of fabric.  She needed mod -max cues on how to get the cases on for first pillow and then needed fewer cues for each subsequent pillow but was not able to do any of the last 3 without A.    She moved into supine and worked on LE strengthening with heel glides, bridging, knee sways and knee extension.  Pt very tired and needed to rest. Pt in bed with alarm set and all needs met.  Therapy Documentation Precautions:  Precautions Precautions: Fall Restrictions Weight Bearing Restrictions: No      Pain:  no c/o pain ADL:    See Function Navigator for Current Functional Status.   Therapy/Group: Individual Therapy  [place] 08/22/2017, 1:39 PM

## 2017-08-22 NOTE — Progress Notes (Signed)
[place] PHYSICAL MEDICINE & REHABILITATION     PROGRESS NOTE  Subjective/Complaints:  Pt seen laying in bed this AM.  She slept fairly overnight and states she just can't sleep.  She does not want any meds however, and states that she needs to figure this out.  She had good first day of therapies.  She also complains of urinary frequency and trouble with deep breaths.    ROS: +Urinary frequency. Denies CP, SOB, N/V/D.  Objective: Vital Signs: Blood pressure (!) 142/58, pulse 71, temperature 98.3 F (36.8 C), temperature source Oral, resp. rate 18, weight 79.5 kg (175 lb 4.8 oz), SpO2 97 %. No results found.  Recent Labs  08/20/17 2111  WBC 7.3  HGB 9.0*  HCT 28.4*  PLT 216    Recent Labs  08/20/17 2111  NA 134*  K 3.6  CL 104  GLUCOSE 117*  BUN 16  CREATININE 0.97  CALCIUM 7.8*   CBG (last 3)   Recent Labs  08/20/17 0856 08/20/17 1242 08/20/17 1553  GLUCAP 133* 165* 94    Wt Readings from Last 3 Encounters:  08/22/17 79.5 kg (175 lb 4.8 oz)  08/20/17 81 kg (178 lb 9.2 oz)  07/28/17 85.7 kg (189 lb)    Physical Exam:  BP (!) 142/58 (BP Location: Right Leg)   Pulse 71   Temp 98.3 F (36.8 C) (Oral)   Resp 18   Wt 79.5 kg (175 lb 4.8 oz)   SpO2 97%   BMI 31.30 kg/m  Constitutional: She appears well-developed and well-nourished.  HENT: Normocephalic and atraumatic.  Eyes: EOM are normal. No discharge.  Cardiovascular: RRR.  No JVD. Respiratory: Effort normal and breath sounds normal.  GI: Bowel sounds are normal. She exhibits no distension.  Musculoskeletal: She exhibits no edema or tenderness.  Neurological: She is alert and oriented.  Motor: B/l UE 4/5 proximal to distal B/l LE: 4/5 proximal to distal   Skin: Skin is warm and dry. Cyst with eschar posterior head Psychiatric: She has a normal mood and affect. Her behavior is normal.    Assessment/Plan: 1. Functional deficits secondary to debility which require 3+ hours per day of  interdisciplinary therapy in a comprehensive inpatient rehab setting. Physiatrist is providing close team supervision and 24 hour management of active medical problems listed below. Physiatrist and rehab team continue to assess barriers to discharge/monitor patient progress toward functional and medical goals.  Function:  Bathing Bathing position   Position: Shower  Bathing parts Body parts bathed by patient: Right arm, Left arm, Chest, Abdomen, Front perineal area, Buttocks, Right upper leg, Left upper leg Body parts bathed by helper: Left lower leg, Right lower leg  Bathing assist Assist Level: Touching or steadying assistance(Pt > 75%)      Upper Body Dressing/Undressing Upper body dressing   What is the patient wearing?: Pull over shirt/dress     Pull over shirt/dress - Perfomed by patient: Thread/unthread right sleeve, Thread/unthread left sleeve, Put head through opening, Pull shirt over trunk          Upper body assist Assist Level: Supervision or verbal cues      Lower Body Dressing/Undressing Lower body dressing   What is the patient wearing?: Non-skid slipper socks           Non-skid slipper socks- Performed by helper: Don/doff right sock, Don/doff left sock                  Lower body assist  Toileting Toileting   Toileting steps completed by patient: Adjust clothing prior to toileting, Performs perineal hygiene Toileting steps completed by helper: Adjust clothing after toileting Toileting Assistive Devices: Grab bar or rail  Toileting assist Assist level: Touching or steadying assistance (Pt.75%)   Transfers Chair/bed transfer   Chair/bed transfer method: Stand pivot Chair/bed transfer assist level: Touching or steadying assistance (Pt > 75%) Chair/bed transfer assistive device: Armrests, Medical sales representative     Max distance: 40' Assist level: Touching or steadying assistance (Pt > 75%)   Wheelchair   Type: Manual Max  wheelchair distance: 50' Assist Level: Supervision or verbal cues  Cognition Comprehension Comprehension assist level: Understands complex 90% of the time/cues 10% of the time  Expression Expression assist level: Expresses complex 90% of the time/cues < 10% of the time  Social Interaction Social Interaction assist level: Interacts appropriately 90% of the time - Needs monitoring or encouragement for participation or interaction.  Problem Solving Problem solving assist level: Solves basic 90% of the time/requires cueing < 10% of the time  Memory Memory assist level: Recognizes or recalls 75 - 89% of the time/requires cueing 10 - 24% of the time    Medical Problem List and Plan: 1.  Debilitation secondary to respiratory failure after VATS procedure with right upper lobe wedge resection with biopsy positive for adenocarcinoma  Cont CIR  Encouraged IS 2.  DVT Prophylaxis/Anticoagulation:  Subcutaneous  Lovenox. Monitor for any bleeding episodes 3. Pain Management: Tylenol as needed 4. Mood:  Klonopin 0.5 mg twice a day as needed anxiety, Risperdal 0.5 mg twice a day as needed agitation 5. Neuropsych: This patient is capable of making decisions on her own behalf. 6. Skin/Wound Care: Routine skin checks 7. Fluids/Electrolytes/Nutrition: Routine I&Os 8. Dysphagia. Dysphagia #2 thin liquids.   Advance as tolerated 9. Acute blood loss anemia.   Hb 9.0 on 10/17  Labs pending 10. Pneumothorax. Chest tube removed 11. Hypertension. Norvasc 10 mg daily, clonidine 0.1 mg twice a day, Lopressor 12.5 mg twice a day.   Overall controlled 10/19 12. PAF. Amiodarone 200 mg daily.  13. History of CVA September 2017. Status post loop recorder 14. Hyperlipidemia. Lipitor 15. Tobacco abuse. Counseling 16. Hypernatremia, now with hyponatremia:   Na+ 134 on 10/17  Labs pending 17. Leukocytosis: Resolved  WBCs 7.3 on 10/17 18. Hypoalbuminemia  Supplement initiated 10/18 19. Bowel incontinence  Bowel  program initated 20. Urinary frequency  PVRs ordered  Ua ordered.  LOS (Days) 2 A FACE TO FACE EVALUATION WAS PERFORMED  Ankit Lorie Phenix 08/22/2017 7:47 AM

## 2017-08-22 NOTE — Telephone Encounter (Signed)
Pt is on TCM after admission into the hospital, then discharged admitted to rehab. Pt is currently in rehab.

## 2017-08-22 NOTE — Progress Notes (Signed)
Physical Therapy Session Note  Patient Details  Name: Kathleen Guerrero MRN: [id number] Date of Birth: [birth year]  Today's Date: 08/22/2017 PT Individual Time: 0900-0955 AND 1635-1705 PT Individual Time Calculation (min): 59 min AND 30 min   Short Term Goals: Week 1:  =LTGs due to ELOS  Skilled Therapeutic Interventions/Progress Updates:   Session 1:  Pt supine upon arrival and requesting to toilet and take a shower. Agreeable to therapy and no c/o pain. Worked on functional strength and mobility this session. Transferred to EOB w/ supervision and used RW to transfer to bedside commode 2/2 pt's history of incontinence. Transferred w/ Min A and performed multiple sit<>stands w/ RW and provided set-up assist for pericare. Ambulated from bedside commode to shower bench w/ Min guard using RW. Pt completed showering w/ Min guard to supervision/set-up assist. Max A for wetting back of head and back. Pt performed multiple sit<>stands in shower w/ grab bars and Min guard from therapist. Transferred to w/c w/ Min guard and performed multiple sit<>stands while donning LE and UE garments w/ Min guard to supervision. Instructed pt and performed LE strengthening exercises in w/c that pt can perform on her own outside of therapy as listed below. Ended session in w/c, call bell within reach and all needs met.   BLE strengthening exercises in seated: -heel raises, 2x10  -toe raises, 2x10  -knee marches, 2x10  -heel slides, 2x10   Session 2:  Pt in supine upon arrival and agreeable to therapy, no c/o pain. Worked on endurance this session w/ gait and discharge planning w/ pt and daughter present. Ambulated >300' in 150-175' bouts w/ seated rests in between. Educated both pt and daughter on pt's progress and potential d/c recommendations. Pt is planning to return home w/ daughter to stay for a few days and possibly an additional few days w/ a friend after daughter leaves if necessary. All in agreement that pt may  need 24/7 assist/supervision at first prior to quickly transitioning to Mod I once more familiar in home environment. Returned to room and ended session supine and in care of daughter, all needs met.   Therapy Documentation Precautions:  Precautions Precautions: Fall Restrictions Weight Bearing Restrictions: No Vital Signs: Therapy Vitals Pulse Rate: 67 BP: (!) 111/49  See Function Navigator for Current Functional Status.   Therapy/Group: Individual Therapy  Hali Balgobin K Arnette 08/22/2017, 10:16 PM

## 2017-08-23 ENCOUNTER — Inpatient Hospital Stay (HOSPITAL_COMMUNITY): Payer: Medicare HMO | Admitting: Speech Pathology

## 2017-08-23 ENCOUNTER — Inpatient Hospital Stay (HOSPITAL_COMMUNITY): Payer: Medicare HMO | Admitting: Physical Therapy

## 2017-08-23 ENCOUNTER — Encounter (HOSPITAL_COMMUNITY): Payer: Self-pay | Admitting: *Deleted

## 2017-08-23 DIAGNOSIS — R0989 Other specified symptoms and signs involving the circulatory and respiratory systems: Secondary | ICD-10-CM

## 2017-08-23 DIAGNOSIS — I10 Essential (primary) hypertension: Secondary | ICD-10-CM

## 2017-08-23 DIAGNOSIS — N390 Urinary tract infection, site not specified: Secondary | ICD-10-CM

## 2017-08-23 MED ORDER — POTASSIUM CHLORIDE CRYS ER 20 MEQ PO TBCR
40.0000 meq | EXTENDED_RELEASE_TABLET | Freq: Two times a day (BID) | ORAL | Status: DC
  Administered 2017-08-23 – 2017-08-28 (×11): 40 meq via ORAL
  Filled 2017-08-23 (×11): qty 2

## 2017-08-23 MED ORDER — NITROFURANTOIN MONOHYD MACRO 100 MG PO CAPS
100.0000 mg | ORAL_CAPSULE | Freq: Two times a day (BID) | ORAL | Status: DC
  Administered 2017-08-23 – 2017-08-24 (×4): 100 mg via ORAL
  Filled 2017-08-23 (×4): qty 1

## 2017-08-23 MED ORDER — LEVALBUTEROL HCL 0.63 MG/3ML IN NEBU: 0.6300 mg | INHALATION_SOLUTION | Freq: Four times a day (QID) | RESPIRATORY_TRACT | Status: DC | PRN

## 2017-08-23 NOTE — Progress Notes (Signed)
Physical Therapy Session Note  Patient Details  Name: Kathleen Guerrero MRN: [id number] Date of Birth: 12-03-1945  Today's Date: 08/23/2017 PT Individual Time: 1000-1055 PT Individual Time Calculation (min): 55 min   Short Term Goals: Week 1:  =LTGs due to ELOS.   Skilled Therapeutic Interventions/Progress Updates:   Pt supine upon arrival and agreeable to therapy, no c/o pain. Supervision w/c mobility to therapy gym to work on functional independence and endurance. Worked on balance and gait training this session. Performed Berg Balance Scale as detailed below, scored 33/56. Worked on static sitting and standing balance while performing bimanual cognitive task. Pt required multiple seated rest breaks 2/2 fatigue w/ sit<>stands and prolonged standing.  Practiced gait using quad cane and single point cane, pt already has quad cane at home. Gait in 25-50' bouts w/ either cane. Pt has slightly increased stability and quality of gait w/ SPC vs quad cane. Min A overall for balance w/ either cane during gait. Will continue to encourage LRAD. Returned to room Total A via w/c and ended session supine, call bell within reach and all needs met.   Therapy Documentation Precautions:  Precautions Precautions: Fall Restrictions Weight Bearing Restrictions: No Balance: Balance Balance Assessed: Yes Standardized Balance Assessment Standardized Balance Assessment: Berg Balance Test Berg Balance Test Sit to Stand: Able to stand  independently using hands Standing Unsupported: Able to stand 2 minutes with supervision Sitting with Back Unsupported but Feet Supported on Floor or Stool: Able to sit safely and securely 2 minutes Stand to Sit: Sits safely with minimal use of hands Transfers: Able to transfer safely, definite need of hands Standing Unsupported with Eyes Closed: Able to stand 10 seconds with supervision Standing Ubsupported with Feet Together: Able to place feet together independently but  unable to hold for 30 seconds From Standing, Reach Forward with Outstretched Arm: Can reach confidently >25 cm (10") From Standing Position, Pick up Object from Floor: Unable to try/needs assist to keep balance From Standing Position, Turn to Look Behind Over each Shoulder: Turn sideways only but maintains balance Turn 360 Degrees: Able to turn 360 degrees safely but slowly Standing Unsupported, Alternately Place Feet on Step/Stool: Able to complete >2 steps/needs minimal assist Standing Unsupported, One Foot in Front: Needs help to step but can hold 15 seconds Standing on One Leg: Tries to lift leg/unable to hold 3 seconds but remains standing independently Total Score: 33  See Function Navigator for Current Functional Status.   Therapy/Group: Individual Therapy  Kathleen Guerrero 08/23/2017, 11:03 AM

## 2017-08-23 NOTE — Progress Notes (Signed)
[place] PHYSICAL MEDICINE & REHABILITATION     PROGRESS NOTE  Subjective/Complaints:  Pt seen laying in bed this AM.  She slept better overnight.  She has questions regarding discharge date, bladder function, and protein supplementation.   ROS: +Urinary frequency. Denies CP, SOB, N/V/D.  Objective: Vital Signs: Blood pressure (!) 145/69, pulse 65, temperature 97.6 F (36.4 C), temperature source Oral, resp. rate 18, weight 78.9 kg (174 lb), SpO2 99 %. No results found.  Recent Labs  08/20/17 2111 08/22/17 0950  WBC 7.3 7.2  HGB 9.0* 11.2*  HCT 28.4* 34.1*  PLT 216 227    Recent Labs  08/20/17 2111 08/22/17 0950  NA 134* 135  K 3.6 3.5  CL 104 104  GLUCOSE 117* 107*  BUN 16 16  CREATININE 0.97 0.98  CALCIUM 7.8* 8.4*   CBG (last 3)   Recent Labs  08/20/17 0856 08/20/17 1242 08/20/17 1553  GLUCAP 133* 165* 94    Wt Readings from Last 3 Encounters:  08/23/17 78.9 kg (174 lb)  08/20/17 81 kg (178 lb 9.2 oz)  07/28/17 85.7 kg (189 lb)    Physical Exam:  BP (!) 145/69 (BP Location: Right Leg)   Pulse 65   Temp 97.6 F (36.4 C) (Oral)   Resp 18   Wt 78.9 kg (174 lb)   SpO2 99%   BMI 31.07 kg/m  Constitutional: She appears well-developed and well-nourished.  HENT: Normocephalic and atraumatic.  Eyes: EOM are normal. No discharge.  Cardiovascular: RRR.  No JVD. Respiratory: Effort normal and breath sounds normal.  GI: Bowel sounds are normal. She exhibits no distension.  Musculoskeletal: She exhibits no edema or tenderness.  Neurological: She is alert and oriented.  Motor: B/l UE 4+/5 proximal to distal B/l LE: 4+/5 proximal to distal   Skin: Skin is warm and dry. Cyst with eschar posterior head Psychiatric: She has a normal mood and affect. Her behavior is normal.    Assessment/Plan: 1. Functional deficits secondary to debility which require 3+ hours per day of interdisciplinary therapy in a comprehensive inpatient rehab setting. Physiatrist is  providing close team supervision and 24 hour management of active medical problems listed below. Physiatrist and rehab team continue to assess barriers to discharge/monitor patient progress toward functional and medical goals.  Function:  Bathing Bathing position   Position: Shower  Bathing parts Body parts bathed by patient: Right arm, Left arm, Chest, Abdomen, Front perineal area, Buttocks, Right upper leg, Left upper leg, Right lower leg, Left lower leg Body parts bathed by helper: Back  Bathing assist Assist Level: Touching or steadying assistance(Pt > 75%)      Upper Body Dressing/Undressing Upper body dressing   What is the patient wearing?: Pull over shirt/dress, Bra Bra - Perfomed by patient: Thread/unthread right bra strap, Thread/unthread left bra strap   Pull over shirt/dress - Perfomed by patient: Thread/unthread right sleeve, Thread/unthread left sleeve, Put head through opening, Pull shirt over trunk          Upper body assist Assist Level: Supervision or verbal cues      Lower Body Dressing/Undressing Lower body dressing   What is the patient wearing?: Non-skid slipper socks           Non-skid slipper socks- Performed by helper: Don/doff right sock, Don/doff left sock                  Lower body assist Assist for lower body dressing: Touching or steadying assistance (Pt > 75%)  Toileting Toileting   Toileting steps completed by patient: Adjust clothing prior to toileting, Performs perineal hygiene, Adjust clothing after toileting Toileting steps completed by helper: Adjust clothing after toileting Toileting Assistive Devices: Grab bar or rail  Toileting assist Assist level: Touching or steadying assistance (Pt.75%)   Transfers Chair/bed transfer   Chair/bed transfer method: Stand pivot Chair/bed transfer assist level: Touching or steadying assistance (Pt > 75%) Chair/bed transfer assistive device: Armrests, Environmental health practitioner     Max distance: 175' Assist level: Supervision or verbal cues   Wheelchair   Type: Manual Max wheelchair distance: 50' Assist Level: Supervision or verbal cues  Cognition Comprehension Comprehension assist level: Understands complex 90% of the time/cues 10% of the time  Expression Expression assist level: Expresses complex 90% of the time/cues < 10% of the time  Social Interaction Social Interaction assist level: Interacts appropriately 90% of the time - Needs monitoring or encouragement for participation or interaction.  Problem Solving Problem solving assist level: Solves basic 75 - 89% of the time/requires cueing 10 - 24% of the time  Memory Memory assist level: Recognizes or recalls 75 - 89% of the time/requires cueing 10 - 24% of the time    Medical Problem List and Plan: 1.  Debilitation secondary to respiratory failure after VATS procedure with right upper lobe wedge resection with biopsy positive for adenocarcinoma  Cont CIR  Encouraged IS 2.  DVT Prophylaxis/Anticoagulation:  Subcutaneous  Lovenox. Monitor for any bleeding episodes 3. Pain Management: Tylenol as needed 4. Mood:  Klonopin 0.5 mg twice a day as needed anxiety, Risperdal 0.5 mg twice a day as needed agitation 5. Neuropsych: This patient is capable of making decisions on her own behalf. 6. Skin/Wound Care: Routine skin checks 7. Fluids/Electrolytes/Nutrition: Routine I&Os 8. Dysphagia. Dysphagia #2 thin liquids.   Advance as tolerated 9. Acute blood loss anemia.   Hb 11.2 on 10/19 10. Pneumothorax. Chest tube removed 11. Hypertension. Norvasc 10 mg daily, clonidine 0.1 mg twice a day, Lopressor 12.5 mg twice a day.   Labile, however overall controlled on 10/20 12. PAF. Amiodarone 200 mg daily.  13. History of CVA September 2017. Status post loop recorder 14. Hyperlipidemia. Lipitor 15. Tobacco abuse. Counseling 16. Labile sodium:   Na+ 135 on 10/19 17. Leukocytosis:  Resolved 18. Hypoalbuminemia  Supplement initiated 10/18 19. Bowel incontinence  Bowel program initated 20. Urinary frequency - likely secondary from UTI  PVRs unremarkable  Empiric Macrobid started on 10/20   Culture ordered      LOS (Days) 3 A FACE TO FACE EVALUATION WAS PERFORMED  Braydan Marriott Lorie Phenix 08/23/2017 6:44 AM

## 2017-08-23 NOTE — Progress Notes (Signed)
Speech Language Pathology Daily Session Note  Patient Details  Name: Kathleen Guerrero MRN: [id number] Date of Birth: 1946-01-08  Today's Date: 08/23/2017 SLP Individual Time: 10-1399 SLP Individual Time Calculation (min): 27 min  Short Term Goals: Week 1: SLP Short Term Goal 1 (Week 1): Patient will perform 25 repetitions of EMST exercises with Mod I and a self-perceived effort level of 7/10.  SLP Short Term Goal 2 (Week 1): Patient will consume trials of Dys. 3 textures with efficient mastication and without overt s/s of aspiration with Mod I.  SLP Short Term Goal 3 (Week 1): Patient will demonstrate functional problem solving for mildly complex tasks with Mod I.  SLP Short Term Goal 4 (Week 1): Patient will recall new, daily information with Mod I.   Skilled Therapeutic Interventions:  Pt was seen for skilled ST targeting dysphagia goals.  SLP facilitated the session with trials of graham crackers and water to continue working towards diet progression.  Pt was mod I for use of swallowing precautions and demonstrated no overt s/s of aspiration with solids or liquids.  Pt was able to clear advanced solids from the oral cavity without difficulty.  As a result, recommend advancing pt to regular textures and thin liquids.  Pt was left in bed with bed alarm set and call bell within reach.  Continue per current plan of care.    Function:  Eating Eating   Modified Consistency Diet: Yes Eating Assist Level: More than reasonable amount of time           Cognition Comprehension Comprehension assist level: Follows complex conversation/direction with extra time/assistive device  Expression   Expression assist level: Expresses complex ideas: With extra time/assistive device  Social Interaction Social Interaction assist level: Interacts appropriately with others with medication or extra time (anti-anxiety, antidepressant).  Problem Solving Problem solving assist level: Solves basic 90% of the  time/requires cueing < 10% of the time  Memory Memory assist level: Recognizes or recalls 75 - 89% of the time/requires cueing 10 - 24% of the time    Pain Pain Assessment Pain Assessment: No/denies pain  Therapy/Group: Individual Therapy  Qasim Diveley, Selinda Orion 08/23/2017, 2:02 PM

## 2017-08-24 ENCOUNTER — Inpatient Hospital Stay (HOSPITAL_COMMUNITY): Payer: Medicare HMO | Admitting: Occupational Therapy

## 2017-08-24 ENCOUNTER — Inpatient Hospital Stay (HOSPITAL_COMMUNITY): Payer: Medicare HMO

## 2017-08-24 DIAGNOSIS — F411 Generalized anxiety disorder: Secondary | ICD-10-CM

## 2017-08-24 MED ORDER — CITALOPRAM HYDROBROMIDE 10 MG PO TABS
10.0000 mg | ORAL_TABLET | Freq: Every day | ORAL | Status: DC
  Administered 2017-08-24 – 2017-08-28 (×5): 10 mg via ORAL
  Filled 2017-08-24 (×5): qty 1

## 2017-08-24 NOTE — Progress Notes (Signed)
[facility] PHYSICAL MEDICINE & REHABILITATION     PROGRESS NOTE  Subjective/Complaints:  Patient seen lying in bed this morning. She states she slept better. She is concerned about her incontinence. She also notes a history of anxiety and feels similar symptoms and request medication.  ROS: +Urinary incontinence. Denies CP, SOB, N/V/D.  Objective: Vital Signs: Blood pressure 114/62, pulse 65, temperature 98.3 F (36.8 C), temperature source Oral, resp. rate 18, weight 79.1 kg (174 lb 4.8 oz), SpO2 99 %. No results found.  Recent Labs  08/22/17 0950  WBC 7.2  HGB 11.2*  HCT 34.1*  PLT 227    Recent Labs  08/22/17 0950  NA 135  K 3.5  CL 104  GLUCOSE 107*  BUN 16  CREATININE 0.98  CALCIUM 8.4*   CBG (last 3)  No results for input(s): GLUCAP in the last 72 hours.  Wt Readings from Last 3 Encounters:  08/24/17 79.1 kg (174 lb 4.8 oz)  08/20/17 81 kg (178 lb 9.2 oz)  07/28/17 85.7 kg (189 lb)    Physical Exam:  BP 114/62 (BP Location: Left Arm)   Pulse 65   Temp 98.3 F (36.8 C) (Oral)   Resp 18   Wt 79.1 kg (174 lb 4.8 oz)   SpO2 99%   BMI 31.12 kg/m  Constitutional: She appears well-developed and well-nourished.  HENT: Normocephalic and atraumatic.  Eyes: EOM are normal. No discharge.  Cardiovascular: RRR.  No JVD. Respiratory: Effort normal and breath sounds normal.  GI: Bowel sounds are normal. She exhibits no distension.  Musculoskeletal: She exhibits no edema or tenderness.  Neurological: She is alert and oriented.  Motor: B/l UE 4+/5 proximal to distal (stable) B/l LE: 4+/5 proximal to distal   Skin: Skin is warm and dry. Cyst with eschar posterior head Psychiatric: She has a normal mood and affect. Her behavior is normal.    Assessment/Plan: 1. Functional deficits secondary to debility which require 3+ hours per day of interdisciplinary therapy in a comprehensive inpatient rehab setting. Physiatrist is providing close team supervision and 24  hour management of active medical problems listed below. Physiatrist and rehab team continue to assess barriers to discharge/monitor patient progress toward functional and medical goals.  Function:  Bathing Bathing position   Position: Shower  Bathing parts Body parts bathed by patient: Right arm, Left arm, Chest, Abdomen, Front perineal area, Buttocks, Right upper leg, Left upper leg, Right lower leg, Left lower leg Body parts bathed by helper: Back  Bathing assist Assist Level: Touching or steadying assistance(Pt > 75%)      Upper Body Dressing/Undressing Upper body dressing   What is the patient wearing?: Pull over shirt/dress, Bra Bra - Perfomed by patient: Thread/unthread right bra strap, Thread/unthread left bra strap   Pull over shirt/dress - Perfomed by patient: Thread/unthread right sleeve, Thread/unthread left sleeve, Put head through opening, Pull shirt over trunk          Upper body assist Assist Level: Supervision or verbal cues      Lower Body Dressing/Undressing Lower body dressing   What is the patient wearing?: Non-skid slipper socks           Non-skid slipper socks- Performed by helper: Don/doff right sock, Don/doff left sock                  Lower body assist Assist for lower body dressing: Touching or steadying assistance (Pt > 75%)      Toileting Toileting   Toileting  steps completed by patient: Adjust clothing prior to toileting, Performs perineal hygiene, Adjust clothing after toileting Toileting steps completed by helper: Adjust clothing after toileting Toileting Assistive Devices: Grab bar or rail  Toileting assist Assist level: Touching or steadying assistance (Pt.75%)   Transfers Chair/bed transfer   Chair/bed transfer method: Stand pivot Chair/bed transfer assist level: Touching or steadying assistance (Pt > 75%) Chair/bed transfer assistive device: Armrests     Locomotion Ambulation     Max distance: 25' Assist level:  Touching or steadying assistance (Pt > 75%)   Wheelchair   Type: Manual Max wheelchair distance: 69' Assist Level: Supervision or verbal cues  Cognition Comprehension Comprehension assist level: Understands complex 90% of the time/cues 10% of the time  Expression Expression assist level: Expresses complex 90% of the time/cues < 10% of the time  Social Interaction Social Interaction assist level: Interacts appropriately 90% of the time - Needs monitoring or encouragement for participation or interaction.  Problem Solving Problem solving assist level: Solves basic 75 - 89% of the time/requires cueing 10 - 24% of the time  Memory Memory assist level: Recognizes or recalls 75 - 89% of the time/requires cueing 10 - 24% of the time    Medical Problem List and Plan: 1.  Debilitation secondary to respiratory failure after VATS procedure with right upper lobe wedge resection with biopsy positive for adenocarcinoma  Cont CIR  Encouraged IS 2.  DVT Prophylaxis/Anticoagulation:  Subcutaneous  Lovenox. Monitor for any bleeding episodes 3. Pain Management: Tylenol as needed 4. Mood:  Klonopin 0.5 mg twice a day as needed anxiety, Risperdal 0.5 mg twice a day as needed agitation  Celexa 10 mg started on 10/21 for anxiety 5. Neuropsych: This patient is capable of making decisions on her own behalf. 6. Skin/Wound Care: Routine skin checks 7. Fluids/Electrolytes/Nutrition: Routine I&Os 8. Dysphagia.: Resolved    Advanced to regular diet 9. Acute blood loss anemia.   Hb 11.2 on 10/19 10. Pneumothorax. Chest tube removed 11. Hypertension. Norvasc 10 mg daily, clonidine 0.1 mg twice a day, Lopressor 12.5 mg twice a day.   Controlled on 10/21 12. PAF. Amiodarone 200 mg daily.  13. History of CVA September 2017. Status post loop recorder 14. Hyperlipidemia. Lipitor 15. Tobacco abuse. Counseling 16. Labile sodium:   Na+ 135 on 10/19 17. Leukocytosis: Resolved 18. Hypoalbuminemia  Supplement initiated  10/18 19. Bowel incontinence  Bowel program initiated  Improved 20. Urinary frequency/incontinence - likely secondary from UTI  PVRs unremarkable  Empiric Macrobid started on 10/20   Culture pending      LOS (Days) 4 A FACE TO FACE EVALUATION WAS PERFORMED  Jaimeson Gopal Lorie Phenix 08/24/2017 6:18 AM

## 2017-08-24 NOTE — Progress Notes (Signed)
Physical Therapy Session Note  Patient Details  Name: Kathleen Guerrero MRN: [id number] Date of Birth: October 13, 1946  Today's Date: 08/24/2017 PT Individual Time: 1100-1155 PT Individual Time Calculation (min): 55 min   Short Term Goals: Week 1:   STG = LTG due to ELOS  Skilled Therapeutic Interventions/Progress Updates:    Pt supine in bed upon PT arrival, agreeable to therapy tx but stating she does not feel well secondary to diarrhea. Daughter present throughout session. Pt transferred supine to sitting EOB with supervision. Pt ambulated from bed<>bathroom and performed toileting with supervision. Discussed ambulating without w/c follow this session in preparation for d/c. Pt ambulated x 55 ft with quad cane and x 150 ft with RW to the gym. Pt with improved step length and gait speed while using RW however states she would prefer to use QC at home. After walking to the gym pt reported chest tightness and pain, RN notified, vitals monitored and stable, pt reports it resolved after deep breathing techniques. Working on standing balance pt stood on foam 2 x 30 sec eyes open and 2 x 15 sec eyes closed without UE support. Pt reported chest pain and tightness again along with pain between shoulder blades. RN notified again, vitals monitored and stable: BP119/61, SpO2 100% and HR 65. Pt transported back to room in w/c secondary to fatigue and pain. Pt transferred w/c>bed, stand pivot with RW and supervision. Pt transferred sitting>supine with supervision. Pt left supine in bed with needs in reach and daughter present. Pt with increased anxiety and fear this session especially during/after ambulation. Practiced breathing and relaxation techniques while supine in bed.   Therapy Documentation Precautions:  Precautions Precautions: Fall Restrictions Weight Bearing Restrictions: No   See Function Navigator for Current Functional Status.   Therapy/Group: Individual Therapy  Netta Corrigan, PT,  DPT 08/24/2017, 7:59 AM

## 2017-08-24 NOTE — Progress Notes (Signed)
Rested better last night. Continent of bladder during night, up to [facility]. No unsafe behaviors. Suture in place to right flank and right chest. steris in place to right upper flank. Senna S held past 2 nights, per patient's request. Cornell Barman

## 2017-08-24 NOTE — Progress Notes (Signed)
Occupational Therapy Session Note  Patient Details  Name: Kathleen Guerrero MRN: [id number] Date of Birth: April 08, 1946  Today's Date: 08/24/2017 OT Individual Time: [phone] OT Individual Time Calculation (min): 56 min  Missed 30 minutes  Short Term Goals: Week 1:  OT Short Term Goal 1 (Week 1): STGs equal to LTGs based on ELOS.   Skilled Therapeutic Interventions/Progress Updates:    Tx focus on functional ambulation with device, activity tolerance, and balance during self care tasks.   Pt greeted EOB, had just finished lunch and was requesting shower. She ambulated with RW and Min A into bathroom, transferred to toilet to void and completed perihygiene while seated. Pt then attempted to stand and reported "I can't do it today. I'm sorry." Then just sat on toilet, quietly. With cues for scooting forward and hand placement, pt able to complete sit<stand from low toilet with Mod A. She then transitioned to TTB with cues for walker placement. Pt bathing while seated, declined to wash LEs or stand. Pt declining to don paper clothing, opting for 2 hospital gowns. With encouragement, pt agreeable to wear brief. Extra time provided for pt to don gripper socks herself sitting at edge of tub bench. "I just can't do it today. I'm sorry."  Pt ambulating to chair at sink to brush hair while seated. Requested to return to bed afterwards. With encouragement, she agreed to fold laundry EOB for activity tolerance purposes. Educated pt on OT POC/goals to maximize participation during later therapy sessions today. Overall, pt exhibited self limiting behaviors this AM, often tried something for less than a minute before adamantly requesting for OT to complete ADL task. Pt returned to supine afterwards, boosted herself with use of headboard. Pt left with all needs within reach and bed alarm activated.   2nd Session (30 min missed) Visitors present at bedside when OT arrived. Pt requesting to participate in therapy when  visitors were not present. Will make up time later in week.   Therapy Documentation Precautions:  Precautions Precautions: Fall Restrictions Weight Bearing Restrictions: No Pain: No c/o pain during tx  Pain Assessment Pain Assessment: No/denies pain ADL:      See Function Navigator for Current Functional Status.  Therapy/Group: Individual Therapy  Kathleen Guerrero 08/24/2017, 12:21 PM

## 2017-08-24 NOTE — Progress Notes (Signed)
Occupational Therapy Note  Patient Details  Name: BIJAL SIGLIN MRN: [id number] Date of Birth: 02/12/1946  Today's Date: 08/24/2017 OT Missed Time: 24 Minutes Missed Time Reason: Patient ill (comment)  Pt reports ongoing diarrhea today and is fatigued from that; requesting to stay in bed this afternoon.    [facility] 08/24/2017, 3:00 PM

## 2017-08-25 ENCOUNTER — Inpatient Hospital Stay (HOSPITAL_COMMUNITY): Payer: Medicare HMO

## 2017-08-25 ENCOUNTER — Inpatient Hospital Stay (HOSPITAL_COMMUNITY): Payer: Medicare HMO | Admitting: Occupational Therapy

## 2017-08-25 LAB — URINE CULTURE: Culture: 60000 — AB

## 2017-08-25 MED ORDER — CIPROFLOXACIN HCL 500 MG PO TABS
250.0000 mg | ORAL_TABLET | Freq: Two times a day (BID) | ORAL | Status: AC
  Administered 2017-08-25 – 2017-08-27 (×6): 250 mg via ORAL
  Filled 2017-08-25 (×6): qty 1

## 2017-08-25 NOTE — Progress Notes (Signed)
Occupational Therapy Session Note  Patient Details  Name: Kathleen Guerrero MRN: [id number] Date of Birth: 1946/05/13  Today's Date: 08/25/2017 OT Individual Time: 1001-1100 OT Individual Time Calculation (min): 59 min    Short Term Goals: Week 1:  OT Short Term Goal 1 (Week 1): STGs equal to LTGs based on ELOS.   Skilled Therapeutic Interventions/Progress Updates:    Pt ambulated to the shower with supervision using the RW for support.  She removed all clothing with min guard assist and therapist helped with adjusting the water temperature.  She completed bathing with poor thoroughness, washing only small segments of her arms, legs and abdomen.  No attempt to wash peri area, feet, or under arms.  When questioned about it she stated "that's enough."  Dressing completed sit to stand from the wheelchair.  Min instructional cueing for donning underpants and pants, for pt to cross LE over the opposite knee.  Min guard for standing to pull them over hips.  All UB dressing was completed at supervision as well.  Once finished had pt ambulate to the sink for brushing her hair with min guard assist.  Then had her attempt ambulation in the hallway without assistive device.  Pt only able to make it about 20 ft before getting anxious and stating the was getting weak.  Therapist returned her to the room where after a brief rest, she ambulated down to the ADL apartment with use of the RW.  Educated pt on tub/shower transfers and need for tub bench.  She was able to complete transfer with bench with close supervision.  She attempted to step over the edge of the tub but was unable to lift her LE high enough.  Do not feel that a small shower seat will be sufficient and she will need the tub bench.  She plans to purchase online.  Proceeded to the kitchen where she was educated on transferring items around with use of the RW, based on her kitchen setup.  Pt needed mod instructional cueing to use countertops and not try and  carry items while walking with the RW.  Therapist took out 6 items from cabinets and refrigerator with pt watching so to see where they came from.  She was only able to place 2/6 back from memory.  She ambulated back to the room to conclude session with supervision with use of the RW.  Pt left in wheelchair with call button and phone in reach.    Therapy Documentation Precautions:  Precautions Precautions: Fall Restrictions Weight Bearing Restrictions: No   Pain: Pain Assessment Pain Assessment: No/denies pain ADL: See Function Navigator for Current Functional Status.   Therapy/Group: Individual Therapy  Edwina Grossberg OTR/L 08/25/2017, 12:28 PM

## 2017-08-25 NOTE — Progress Notes (Signed)
[place] [facility] PHYSICAL MEDICINE & REHABILITATION     PROGRESS NOTE  Subjective/Complaints:  Patient seen lying in bed this morning. She states she did not sleep well overnight due to urinary frequency. She states she wants to share goals with me and mentioned that she would like to leave on Thursday.  ROS: +Urinary frequency. Denies CP, SOB, N/V/D.  Objective: Vital Signs: Blood pressure 132/64, pulse 69, temperature 98.8 F (37.1 C), temperature source Oral, resp. rate 18, weight 78.2 kg (172 lb 4.8 oz), SpO2 95 %. No results found.  Recent Labs  08/22/17 0950  WBC 7.2  HGB 11.2*  HCT 34.1*  PLT 227    Recent Labs  08/22/17 0950  NA 135  K 3.5  CL 104  GLUCOSE 107*  BUN 16  CREATININE 0.98  CALCIUM 8.4*   CBG (last 3)  No results for input(s): GLUCAP in the last 72 hours.  Wt Readings from Last 3 Encounters:  08/25/17 78.2 kg (172 lb 4.8 oz)  08/20/17 81 kg (178 lb 9.2 oz)  07/28/17 85.7 kg (189 lb)    Physical Exam:  BP 132/64 (BP Location: Left Arm)   Pulse 69   Temp 98.8 F (37.1 C) (Oral)   Resp 18   Wt 78.2 kg (172 lb 4.8 oz)   SpO2 95%   BMI 30.77 kg/m  Constitutional: She appears well-developed and well-nourished.  HENT: Normocephalic and atraumatic.  Eyes: EOM are normal. No discharge.  Cardiovascular: RRR.  No JVD. Respiratory: Effort normal and breath sounds normal.  GI: Bowel sounds are normal. She exhibits no distension.  Musculoskeletal: She exhibits no edema or tenderness.  Neurological: She is alert and oriented.  Motor: B/l UE 4+/5 proximal to distal (improving) B/l LE: 4+/5 proximal to distal   Skin: Skin is warm and dry. Cyst with eschar posterior head Psychiatric: She has a normal mood and affect. Her behavior is normal.    Assessment/Plan: 1. Functional deficits secondary to debility which require 3+ hours per day of interdisciplinary therapy in a comprehensive inpatient rehab setting. Physiatrist is providing close team  supervision and 24 hour management of active medical problems listed below. Physiatrist and rehab team continue to assess barriers to discharge/monitor patient progress toward functional and medical goals.  Function:  Bathing Bathing position   Position: Shower  Bathing parts Body parts bathed by patient: Right arm, Left arm, Chest, Abdomen, Front perineal area, Buttocks, Right upper leg, Left upper leg Body parts bathed by helper: Back  Bathing assist Assist Level: Touching or steadying assistance(Pt > 75%)      Upper Body Dressing/Undressing Upper body dressing   What is the patient wearing?: Hospital gown Bra - Perfomed by patient: Thread/unthread right bra strap, Thread/unthread left bra strap   Pull over shirt/dress - Perfomed by patient: Thread/unthread right sleeve, Thread/unthread left sleeve, Put head through opening, Pull shirt over trunk          Upper body assist Assist Level: Set up      Lower Body Dressing/Undressing Lower body dressing   What is the patient wearing?: Non-skid slipper socks           Non-skid slipper socks- Performed by helper: Don/doff right sock, Don/doff left sock                  Lower body assist Assist for lower body dressing: Touching or steadying assistance (Pt > 75%)      Toileting Toileting   Toileting steps completed by patient:  Adjust clothing prior to toileting, Performs perineal hygiene, Adjust clothing after toileting Toileting steps completed by helper: Adjust clothing after toileting Toileting Assistive Devices: Grab bar or rail  Toileting assist Assist level: Touching or steadying assistance (Pt.75%)   Transfers Chair/bed transfer   Chair/bed transfer method: Stand pivot Chair/bed transfer assist level: Touching or steadying assistance (Pt > 75%) Chair/bed transfer assistive device: Armrests     Locomotion Ambulation     Max distance: 150 ft Assist level: Touching or steadying assistance (Pt > 75%)    Wheelchair   Type: Manual Max wheelchair distance: 15' Assist Level: Supervision or verbal cues  Cognition Comprehension Comprehension assist level: Understands complex 90% of the time/cues 10% of the time  Expression Expression assist level: Expresses complex 90% of the time/cues < 10% of the time  Social Interaction Social Interaction assist level: Interacts appropriately 90% of the time - Needs monitoring or encouragement for participation or interaction.  Problem Solving Problem solving assist level: Solves basic 75 - 89% of the time/requires cueing 10 - 24% of the time  Memory Memory assist level: Recognizes or recalls 75 - 89% of the time/requires cueing 10 - 24% of the time    Medical Problem List and Plan: 1.  Debilitation secondary to respiratory failure after VATS procedure with right upper lobe wedge resection with biopsy positive for adenocarcinoma  Cont CIR  Encouraged IS 2.  DVT Prophylaxis/Anticoagulation:  Subcutaneous  Lovenox. Monitor for any bleeding episodes 3. Pain Management: Tylenol as needed 4. Mood:  Klonopin 0.5 mg twice a day as needed anxiety, Risperdal 0.5 mg twice a day as needed agitation  Celexa 10 mg started on 10/21 for anxiety 5. Neuropsych: This patient is capable of making decisions on her own behalf. 6. Skin/Wound Care: Routine skin checks 7. Fluids/Electrolytes/Nutrition: Routine I&Os 8. Dysphagia.: Resolved    Advanced to regular diet 9. Acute blood loss anemia.   Hb 11.2 on 10/19 10. Pneumothorax. Chest tube removed 11. Hypertension. Norvasc 10 mg daily, clonidine 0.1 mg twice a day, Lopressor 12.5 mg twice a day.   Controlled on 10/22 12. PAF. Amiodarone 200 mg daily.  13. History of CVA September 2017. Status post loop recorder 14. Hyperlipidemia. Lipitor 15. Tobacco abuse. Counseling 16. Labile sodium:   Na+ 135 on 10/19 17. Leukocytosis: Resolved 18. Hypoalbuminemia  Supplement initiated 10/18 19. Bowel incontinence  Bowel  program initiated  Improved 20. Urinary frequency/incontinence - likely secondary from UTI  PVRs unremarkable  Escherichia coli and Klebsiella  Intermediate resistant Macrobid, Cipro started on 10/22-10/24  LOS (Days) 5 A FACE TO FACE EVALUATION WAS PERFORMED  Ankit Lorie Phenix 08/25/2017 8:26 AM

## 2017-08-25 NOTE — Progress Notes (Signed)
Social Work Patient ID: Kathleen Guerrero, female   DOB: December 09, 1945, 71 y.o.   MRN: [id number] Working with pt on discharge plans. She plans to go to cousin's home where someone is in and out and can provide some assistance to. Will have team conference Wed to discuss goals and set a target discharge date. Will have neuro-psych see for coping and anxiety management this week.

## 2017-08-25 NOTE — Progress Notes (Signed)
Physical Therapy Session Note  Patient Details  Name: Kathleen Guerrero MRN: [id number] Date of Birth: 06/26/1946  Today's Date: 08/25/2017 PT Individual Time: [phone], [id number] PT Individual Time Calculation (min): 45 min, 59 min   Short Term Goals: Week 1:  PT Short Term Goal 1 (Week 1): STG=LTG due to ELOS  Skilled Therapeutic Interventions/Progress Updates:    Session 1: Pt supine in bed upon PT arrival, agreeable to therapy and denies pain. Pt reports feeling much better today. Pt transferred from supine to sitting EOB with min assist. Pt ambulated x120 ft from room to ortho gym with supervision using RW. Pt reported that she thought using a RW at home would be too big to fit in her small condo. Pt trailed using SPC and quad cane each x 50 ft and requiring min assist due to occasional LOB. Pt reported she felt much more stable with the RW and thinks she would feel safer at home with the RW instead of a cane. Pt reported needing to use the bathroom. Pt transported from gym to room in w/c total assist. Pt ambulated from w/c<>toilet using RW and supervision, performed all toileting with supervision. Pt transported back to gym in w/c total assist. Pt ascended/descended 4 steps using B handrails and min assist, verbal cues for technique. Pt left seated in w/c at end of session with needs in reach.   Session 2: Pt supine in bed upon PT arrival, agreeable to therapy tx and denies pain. Pt reports not feeling well and having bouts of diarrhea. Pt ambulated from bed<>bathroom for BM, using RW and supervision. Pt performed all toileting with supervision and using RW for balance when donning/doffing pants. Pt ambulated to sink in order to wash hands using RW and supervision. Pt ambulated from room>gym with supervision using RW x 150 ft. Pt reported needing to have another BM, pt transported to bathroom total assist. Pt performed stand pivot transfer w/c<>toilet and performed toileting with supervision. Pt  worked on dynamic standing balance activities to perform ball toss and to complete puzzle while standing on foam. Pt reported feeling nauseous and felt like she was about to throw up. Therapist assisted pt to a seated position and monitored vitals: BP 91/46, HR 67 and SpO2 97%. Pt transported back to room total assist and assisted back to bed with min assist. RN notified of drop in blood pressure during session as well as nausea. Therapist provided pt with water and encouraged hydration. Pt supine in bed with needs in reach and daughter present, daughter also checked off for transfers this session.   Therapy Documentation Precautions:  Precautions Precautions: Fall Restrictions Weight Bearing Restrictions: No   See Function Navigator for Current Functional Status.   Therapy/Group: Individual Therapy  Netta Corrigan, PT, DPT 08/25/2017, 9:48 AM

## 2017-08-25 NOTE — Progress Notes (Signed)
Speech Language Pathology Daily Session Note  Patient Details  Name: Kathleen Guerrero MRN: [id number] Date of Birth: 06-Jun-1946  Today's Date: 08/25/2017 SLP Individual Time: 1132-1203 SLP Individual Time Calculation (min): 31 min  Short Term Goals: Week 1: SLP Short Term Goal 1 (Week 1): Patient will perform 25 repetitions of EMST exercises with Mod I and a self-perceived effort level of 7/10.  SLP Short Term Goal 2 (Week 1): Patient will consume trials of Dys. 3 textures with efficient mastication and without overt s/s of aspiration with Mod I.  SLP Short Term Goal 3 (Week 1): Patient will demonstrate functional problem solving for mildly complex tasks with Mod I.  SLP Short Term Goal 4 (Week 1): Patient will recall new, daily information with Mod I.   Skilled Therapeutic Interventions:Skilled ST services focused on cognitive and speech support skills. SLP facilitated EMST exercises beginning at level 10 (25 repetitions and self perceived effort level of 3) then adjusting to 12 for increased difficulty for 10 repetitions for a self-perceived effort level of 7. Pt demonstrated at Mod I level. Pt demonstrated recall of today's therapy events and semi-complex directions given time delay of 3-5 minutes in a distracting environment with supervision question cues. Pt has requested to leave on Thursday/ Pt was left in room with call bell within reach. Recommend to continue ST services.     Function:  Eating Eating                 Cognition Comprehension Comprehension assist level: Understands complex 90% of the time/cues 10% of the time  Expression   Expression assist level: Expresses complex 90% of the time/cues < 10% of the time  Social Interaction Social Interaction assist level: Interacts appropriately 90% of the time - Needs monitoring or encouragement for participation or interaction.  Problem Solving Problem solving assist level: Solves basic 75 - 89% of the time/requires cueing 10  - 24% of the time  Memory Memory assist level: Recognizes or recalls 90% of the time/requires cueing < 10% of the time    Pain Pain Assessment Pain Assessment: No/denies pain  Therapy/Group: Individual Therapy  [name]  [facility] 08/25/2017, 12:11 PM

## 2017-08-25 NOTE — Progress Notes (Signed)
No stools during night. Patient reports having Guerrero "panic attack" Saturday and Sunday, during the day. No problems with anxiety/panic attacks on my shift. Suture to right chest and right flank intact, slight erythema. Couple of steris in place to right flank incision. Kathleen Guerrero

## 2017-08-26 ENCOUNTER — Inpatient Hospital Stay (HOSPITAL_COMMUNITY): Payer: Medicare HMO | Admitting: Speech Pathology

## 2017-08-26 ENCOUNTER — Inpatient Hospital Stay (HOSPITAL_COMMUNITY): Payer: Medicare HMO | Admitting: Occupational Therapy

## 2017-08-26 ENCOUNTER — Inpatient Hospital Stay (HOSPITAL_COMMUNITY): Payer: Medicare HMO | Admitting: Physical Therapy

## 2017-08-26 ENCOUNTER — Encounter (HOSPITAL_COMMUNITY): Payer: Medicare HMO | Admitting: Psychology

## 2017-08-26 ENCOUNTER — Inpatient Hospital Stay (HOSPITAL_COMMUNITY): Payer: Medicare HMO

## 2017-08-26 DIAGNOSIS — F3341 Major depressive disorder, recurrent, in partial remission: Secondary | ICD-10-CM

## 2017-08-26 MED ORDER — ACETAMINOPHEN 325 MG PO TABS
650.0000 mg | ORAL_TABLET | Freq: Four times a day (QID) | ORAL | Status: DC | PRN
  Administered 2017-08-26: 650 mg via ORAL
  Filled 2017-08-26: qty 2

## 2017-08-26 NOTE — Consult Note (Signed)
Neuropsychological Consultation   Patient:   Kathleen Guerrero   DOB:   01-23-1946  MR Number:  [id number]  Location:  [facility] [address] [place] [facility] [address] [id number] [place] [address] Dept: [place]: [phone]           Date of Service:   08/26/2017  Start Time:   2 PM End Time:   3 PM  Provider/Observer:  Ilean Skill, Psy.D.       Clinical Neuropsychologist       Billing Code/Service: [phone] Units  Chief Complaint:    Kathleen Guerrero is a 71 year old female with past history of hypertension, hepatitis, and CVA (one year ago).  07/17/2017 developed acute respiratory distress accompanied by crepitus.  Chest x-ray showed small apical pneumothorax.  Patient extubated on 08/05/2017 with need to CIR program due to debility.  She has been improving with discharge planned tomorrow.  Patient developed anxiety and PTSD like symptoms following ICU due to experiences both actual to medical status and resulting from altered mental status due to medical condition and medication protocols.  Ultra vivid dreams and dissociative experience during that time continued in the form of flashback and anxiety responses during rehab stay.   Reason for Service:  Patient referred for neuropsychological consultation due to anxiety and PTSD like responses.  She does have past history of at least one major depressive episode 10-15 years ago.  Below is the HPI for the current admission.  HPI: Kathleen Guerrero a 71 y.o.right handed femalewith past medical history of hypertension, hepatitis, tobacco abuse and CVA status post loop recorder September 2017. Per chart review patient lives alone independently prior to admission and still working. One level home 6 steps to entry. As part of patient's workup from CVA 2017 she had a CT of her neck that showed right upper lobe groundglass opacity. CT the chest showed a 13 x 9 x 8 mm opacity and  follow-up recommended. She was seen outpatient by Dr. Melvyn Novas with follow-up CT in November in March showing no significant change. Recently another follow-up scan showed increase in size to 13 x 20 mm. She was admitted 07/17/2017 for ongoing evaluation and care with right upper lobe wedge resection biopsy adenocarcinoma. Underwent VATS, thoracic right upper lobectomy, lymph node dissection 07/30/2017 per Dr. Roxan Hockey. Hospital course pain management. On 08/01/2017 patient developed acute respiratory distress accompanied by crepitus over her right chest wall and was intubated. Chest x-ray showed small apical pneumothorax. A chest tube was later placed 08/03/2017 and has since been removed. Patient later extubated 08/11/2017. CT head on 08/05/17 reviewed, showing air.  Hospital course acute blood loss anemia and monitored. Initially with nasogastric tube feeds modified barium swallow completed diet has been advanced to dysphagia #2 thin liquids and nasogastric tube removed. Subcutaneous Lovenox added for DVT prophylaxis. Physical and occupational therapy evaluation completed 08/12/2017 with recommendations of physical medicine rehabilitation consult. Patient was admitted for a comprehensive rehabilitation program.  Current Status:  Patient continues with anxiety episode states and PTSD like symptoms.  During these times, patient has flashbacks of altered experiences during ICU.  Behavioral Observation: [name]  presents as a 71 y.o.-year-old Right Caucasian Female who appeared her stated age. her dress was Appropriate and she was Well Groomed and her manners were Appropriate to the situation.  her participation was indicative of Appropriate and Attentive behaviors.  There were physical disabilities noted due to weakness.  she displayed an appropriate level of cooperation and motivation.     Interactions:    Active Appropriate and Attentive  Attention:   within normal limits and attention span and  concentration were age appropriate  Memory:   within normal limits; recent and remote memory intact  Visuo-spatial:  within normal limits  Speech (Volume):  low  Speech:   normal; normal  Thought Process:  Coherent and Relevant  Though Content:  WNL; not suicidal  Orientation:   person, place, time/date and situation  Judgment:   Good  Planning:   Good  Affect:    Anxious  Mood:    Anxious  Insight:   Good  Intelligence:   high  Medical History:   Past Medical History:  Diagnosis Date  . Arthritis   . GERD (gastroesophageal reflux disease)    tums  . Headache   . Hepatitis    cmv 30 yrs ago hepatitis  . History of hiatal hernia   . History of loop recorder    after stroke  . Hypertension   . Stroke ([place]) 07/2016   balance a little off  . Tobacco use         Abuse/Trauma History: Patient had altered state experiences during ICU due to combination of medical status and medications use in care.  She has continued (but less intense and less frequent now) with these responses during CIR program.  Psychiatric History:  Patient reports past depressive events treated with Lexapro.  Family Med/Psych History:  Family History  Problem Relation Age of Onset  . Kidney disease Maternal Grandmother   . Cancer Maternal Grandfather     Risk of Suicide/Violence: virtually non-existent Patient denies SI or HI.  Impression/DX:  Kathleen Guerrero is a 71 year old female with past history of hypertension, hepatitis, and CVA (one year ago).  07/17/2017 developed acute respiratory distress accompanied by crepitus.  Chest x-ray showed small apical pneumothorax.  Patient extubated on 08/05/2017 with need to CIR program due to debility.  She has been improving with discharge planned tomorrow.  Patient developed anxiety and PTSD like symptoms following ICU due to experiences both actual to medical status and resulting from altered mental status due to medical condition and medication  protocols.  Ultra vivid dreams and dissociative experience during that time continued in the form of flashback and anxiety responses during rehab stay.   Patient continues with anxiety episode states and PTSD like symptoms.  During these times, patient has flashbacks of altered experiences during ICU.  Disposition/Plan:  Patient to be discharged home tomorrow.  Will follow-up outpatient if these symptoms don't resolve with time.  Diagnosis:    Debility - Plan: Ambulatory referral to Physical Medicine Rehab      Anxiety State due to recent experience in ICU due to medications and medical status      History of Major Depression, In partial remission.        Electronically Signed   _______________________ Ilean Skill, Psy.D.

## 2017-08-26 NOTE — Progress Notes (Signed)
Physical Therapy Session Note  Patient Details  Name: LUBERTHA LEITE MRN: [id number] Date of Birth: [birth year]  Today's Date: 08/26/2017 PT Individual Time: 1600-1629 PT Individual Time Calculation (min): 29 min   Short Term Goals: Week 1:  PT Short Term Goal 1 (Week 1): STG=LTG due to ELOS  Skilled Therapeutic Interventions/Progress Updates:    Pt supine in bed upon PT arrival, agreeable to therapy tx and denies pain. Pt transferred supine to sitting with supervision. Pt ambulated from bed<>toilet in order to use the bathroom, pt ambulated into bathroom with RW and performed all toileting with supervision. Pt ambulated from room<>gym x 150 ft pushing w/c for UE support and with supervision. Pt performed seated therex on edge of mat for LE strengthening: 2 x 10 hip abduction, 2 x 10 LAQ and 2 x 10 hip flexion. Pt left supine in bed at end of session with needs in reach.   Therapy Documentation Precautions:  Precautions Precautions: Fall Restrictions Weight Bearing Restrictions: No   See Function Navigator for Current Functional Status.   Therapy/Group: Individual Therapy  Netta Corrigan, PT, DPT 08/26/2017, 4:02 PM

## 2017-08-26 NOTE — Progress Notes (Signed)
Social Work Patient ID: Kathleen Guerrero, female   DOB: 12-27-1945, 71 y.o.   MRN: [id number]  Met with pt who reports she would like to be discharged soon. Her goal is by Thursday.have asked therapy team to begin family education with daughter who is here and requested her friend to come in also prior to discharge home. Daughter is only staying until Tuesday. Pt did semi-fall in the gym with PT and her daughter. She states: " I am fine I feel bad for the therapist she was upset, give her a hug for me." She has been to OP therapies, she will discuss with daughter which would work best for them home health versus OP. Work on discharge needs and conference her tomorrow.

## 2017-08-26 NOTE — Progress Notes (Signed)
[facility] PHYSICAL MEDICINE & REHABILITATION     PROGRESS NOTE  Subjective/Complaints:  Pt seen laying in bed this AM. She slept better overnight.  She wants to go home and states she will have help.    ROS: +Urinary frequency. Denies CP, SOB, N/V/D.  Objective: Vital Signs: Blood pressure 126/65, pulse 70, temperature 98.1 F (36.7 C), temperature source Oral, resp. rate 16, weight 78.2 kg (172 lb 7.2 oz), SpO2 96 %. No results found. No results for input(s): WBC, HGB, HCT, PLT in the last 72 hours. No results for input(s): NA, K, CL, GLUCOSE, BUN, CREATININE, CALCIUM in the last 72 hours.  Invalid input(s): CO CBG (last 3)  No results for input(s): GLUCAP in the last 72 hours.  Wt Readings from Last 3 Encounters:  08/26/17 78.2 kg (172 lb 7.2 oz)  08/20/17 81 kg (178 lb 9.2 oz)  07/28/17 85.7 kg (189 lb)    Physical Exam:  BP 126/65 (BP Location: Right Arm)   Pulse 70   Temp 98.1 F (36.7 C) (Oral)   Resp 16   Wt 78.2 kg (172 lb 7.2 oz)   SpO2 96%   BMI 30.79 kg/m  Constitutional: She appears well-developed and well-nourished.  HENT: Normocephalic and atraumatic.  Eyes: EOM are normal. No discharge.  Cardiovascular: RRR.  No JVD. Respiratory: Effort normal and breath sounds normal.  GI: Bowel sounds are normal. She exhibits no distension.  Musculoskeletal: She exhibits no edema or tenderness.  Neurological: She is alert and oriented.  Motor: B/l UE 4+/5 proximal to distal (stable) B/l LE: 4+/5 proximal to distal   Skin: Skin is warm and dry. Cyst with eschar posterior head Psychiatric: She has a normal mood and affect. Her behavior is normal.    Assessment/Plan: 1. Functional deficits secondary to debility which require 3+ hours per day of interdisciplinary therapy in a comprehensive inpatient rehab setting. Physiatrist is providing close team supervision and 24 hour management of active medical problems listed below. Physiatrist and rehab team continue to  assess barriers to discharge/monitor patient progress toward functional and medical goals.  Function:  Bathing Bathing position   Position: Shower  Bathing parts Body parts bathed by patient: Right upper leg, Left upper leg, Abdomen Body parts bathed by helper: Back  Bathing assist Assist Level: Touching or steadying assistance(Pt > 75%)      Upper Body Dressing/Undressing Upper body dressing   What is the patient wearing?: Pull over shirt/dress Bra - Perfomed by patient: Thread/unthread right bra strap, Thread/unthread left bra strap, Hook/unhook bra (pull down sports bra)   Pull over shirt/dress - Perfomed by patient: Thread/unthread right sleeve, Thread/unthread left sleeve, Put head through opening, Pull shirt over trunk          Upper body assist Assist Level: Set up      Lower Body Dressing/Undressing Lower body dressing   What is the patient wearing?: Shoes, Underwear Underwear - Performed by patient: Thread/unthread right underwear leg, Thread/unthread left underwear leg, Pull underwear up/down   Pants- Performed by patient: Thread/unthread right pants leg, Thread/unthread left pants leg, Pull pants up/down     Non-skid slipper socks- Performed by helper: Don/doff right sock, Don/doff left sock     Shoes - Performed by patient: Don/doff right shoe, Don/doff left shoe            Lower body assist Assist for lower body dressing: Supervision or verbal cues      Toileting Toileting   Toileting steps completed by  patient: Adjust clothing prior to toileting, Performs perineal hygiene, Adjust clothing after toileting Toileting steps completed by helper: Adjust clothing prior to toileting, Performs perineal hygiene, Adjust clothing after toileting (per Ivery Quale, NT) Toileting Assistive Devices: Grab bar or rail  Toileting assist Assist level: Touching or steadying assistance (Pt.75%)   Transfers Chair/bed transfer   Chair/bed transfer method: Stand  pivot Chair/bed transfer assist level: Touching or steadying assistance (Pt > 75%) Chair/bed transfer assistive device: Armrests     Locomotion Ambulation     Max distance: 120 ft Assist level: Touching or steadying assistance (Pt > 75%)   Wheelchair   Type: Manual Max wheelchair distance: 67' Assist Level: Supervision or verbal cues  Cognition Comprehension Comprehension assist level: Understands complex 90% of the time/cues 10% of the time  Expression Expression assist level: Expresses complex 90% of the time/cues < 10% of the time  Social Interaction Social Interaction assist level: Interacts appropriately 90% of the time - Needs monitoring or encouragement for participation or interaction.  Problem Solving Problem solving assist level: Solves basic 75 - 89% of the time/requires cueing 10 - 24% of the time  Memory Memory assist level: Recognizes or recalls 75 - 89% of the time/requires cueing 10 - 24% of the time    Medical Problem List and Plan: 1.  Debilitation secondary to respiratory failure after VATS procedure with right upper lobe wedge resection with biopsy positive for adenocarcinoma  Cont CIR  Encouraged IS  Pt would like to go home ASAP, in process of discussing with team 2.  DVT Prophylaxis/Anticoagulation:  Subcutaneous  Lovenox. Monitor for any bleeding episodes 3. Pain Management: Tylenol as needed 4. Mood:  Klonopin 0.5 mg twice a day as needed anxiety, Risperdal 0.5 mg twice a day as needed agitation  Celexa 10 mg started on 10/21  5. Neuropsych: This patient is capable of making decisions on her own behalf. 6. Skin/Wound Care: Routine skin checks 7. Fluids/Electrolytes/Nutrition: Routine I&Os 8. Dysphagia.: Resolved    Advanced to regular diet 9. Acute blood loss anemia.   Hb 11.2 on 10/19 10. Pneumothorax. Chest tube removed 11. Hypertension. Norvasc 10 mg daily, clonidine 0.1 mg twice a day, Lopressor 12.5 mg twice a day.   Controlled on 10/23 12. PAF.  Amiodarone 200 mg daily.  13. History of CVA September 2017. Status post loop recorder 14. Hyperlipidemia. Lipitor 15. Tobacco abuse. Counseling 16. Labile sodium:   Na+ 135 on 10/19 17. Leukocytosis: Resolved 18. Hypoalbuminemia  Supplement initiated 10/18 19. Bowel incontinence  Bowel program initiated  Improved 20. Urinary frequency/incontinence - likely secondary from UTI  PVRs unremarkable  Escherichia coli and Klebsiella  Intermediate resistant Macrobid, Cipro started on 10/22-10/24  LOS (Days) 6 A FACE TO FACE EVALUATION WAS PERFORMED  Tomara Youngberg Lorie Phenix 08/26/2017 8:10 AM

## 2017-08-26 NOTE — Progress Notes (Signed)
Physical Therapy Session Note  Patient Details  Name: Kathleen Guerrero MRN: [id number] Date of Birth: 05/12/1946  Today's Date: 08/26/2017 PT Individual Time: 1000-1045 PT Individual Time Calculation (min): 45 min  and Today's Date: 08/26/2017 PT Missed Time: 15 Minutes Missed Time Reason: Other (Comment) (Pt requesting to return to room after fall during treatment)  Short Term Goals: Week 1:  PT Short Term Goal 1 (Week 1): STG=LTG due to ELOS  Skilled Therapeutic Interventions/Progress Updates:   Pt supine upon arrival, agreeable to therapy and no c/o pain. Daughter present for duration of session. Spent first 15 minutes discussing pt's request to D/C early this week as she feels she is ready to go home and has the assistance she will need. Problem-solved moving around condo and friend's house (likely discharge location) w/ RW and energy conservation pt will need to keep in mind w/ all mobility as her endurance is still decreased. Pt and daughter appreciative of conversation and in agreement w/ necessary 24/7 assistance required at this time. Ambulated to gym w/ close supervision, >150', and practiced getting up from low surfaces and negotiating stairs w/ similar set-up as D/C environment. Pt requires Min A for steps at this time 2/2 decreased LE strength. Began to practice floor transfers w/ multimodal cues for technique, pt transitioning from edge of mat to kneeling on floor mat when she was unable to maintain balance, fell into quadruped, and bumped R temple on floor of gym. Pt w/ abrasion at lateral R eyebrow after which she transferred from floor to sitting edge of mat w/ Min A. Pt w/ increased anxiety about bleeding, however calmed down once RN and PA-C come to reassure her. Vitals WNL and pt alert and oriented x4 throughout incident. Transferred back to w/c and returned to room in w/c and ended session in w/c and in care of daughter, all needs met. Pt denied any concerns regarding safety and  felt comfortable w/ care received. Will continue to discuss pt's safety and readiness to D/C w/ medical team.   Therapy Documentation Precautions:  Precautions Precautions: Fall Restrictions Weight Bearing Restrictions: No General: PT Amount of Missed Time (min): 15 Minutes PT Missed Treatment Reason: Other (Comment) (Pt requesting to return to room after fall during treatment) Vital Signs: Therapy Vitals Temp: 97.8 F (36.6 C) Temp Source: Oral Pulse Rate: (!) 59 Resp: 20 BP: (!) 106/52 Patient Position (if appropriate): Lying Oxygen Therapy SpO2: 94 % O2 Device: Not Delivered Pain: Pain Assessment Pain Assessment: No/denies pain Pain Score: 2  Pain Type: Acute pain Pain Location: Head Pain Orientation: Right Pain Descriptors / Indicators: Aching Pain Frequency: Rarely Pain Onset: With Activity Patients Stated Pain Goal: 2 Pain Intervention(s): Medication (See eMAR);Repositioned Multiple Pain Sites: No  See Function Navigator for Current Functional Status.   Therapy/Group: Individual Therapy   K Arnette 08/26/2017, 2:32 PM  

## 2017-08-26 NOTE — Progress Notes (Signed)
Occupational Therapy Session Note  Patient Details  Name: Kathleen Guerrero MRN: [id number] Date of Birth: Nov 23, 1945  Today's Date: 08/26/2017 OT Individual Time: 1305-1401 OT Individual Time Calculation (min): 56 min   Short Term Goals: Week 1:  OT Short Term Goal 1 (Week 1): STGs equal to LTGs based on ELOS.   Skilled Therapeutic Interventions/Progress Updates:    Pt greeted semi-reclined in bed finishing discussion with PA and agreeable to OT. Treatment session focused on activity tolerance, and UB strengthening. Pt declined any bathing/dessing today despite encouragement. Pt transferred OOB and ambulated to the bathroom with supervision and RW. Pt voided bladder, completed peri-care and managed clothing without assistance. Activity tolerance and standing balance with grooming tasks at the sink. Pt with 0 LOB in standing without UE support. UB coordination and there-ex with wc propulsion. Pt then completed 10 mins total on sci-fit arm bike on level 1. Pt needed rest breaks after every 1-1.5 minutes 2/2 fatigue. Educated on energy conservation techniques within home environment during UB there-ex. OT propelled pt back to room at end of session for time management. Pt left semi-reclined in bed with needs met.   Therapy Documentation Precautions:  Precautions Precautions: Fall Restrictions Weight Bearing Restrictions: No Pain: Pain Assessment Pain Assessment: No/denies pain Pain Score: 2  Pain Type: Acute pain Pain Location: Head Pain Orientation: Right Pain Descriptors / Indicators: Aching Pain Frequency: Rarely Pain Onset: With Activity Patients Stated Pain Goal: 2 Pain Intervention(s): ;Repositioned Multiple Pain Sites: No  See Function Navigator for Current Functional Status.   Therapy/Group: Individual Therapy  Valma Cava 08/26/2017, 1:44 PM

## 2017-08-26 NOTE — Progress Notes (Signed)
Speech Language Pathology Daily Session Note  Patient Details  Name: Kathleen Guerrero MRN: [id number] Date of Birth: 02-04-1946  Today's Date: 08/26/2017 SLP Individual Time: 1120-1200 SLP Individual Time Calculation (min): 40 min  Short Term Goals: Week 1: SLP Short Term Goal 1 (Week 1): Patient will perform 25 repetitions of EMST exercises with Mod I and a self-perceived effort level of 7/10.  SLP Short Term Goal 2 (Week 1): Patient will consume trials of Dys. 3 textures with efficient mastication and without overt s/s of aspiration with Mod I.  SLP Short Term Goal 3 (Week 1): Patient will demonstrate functional problem solving for mildly complex tasks with Mod I.  SLP Short Term Goal 4 (Week 1): Patient will recall new, daily information with Mod I.   Skilled Therapeutic Interventions:  Pt was seen for skilled ST targeting goals for dysphagia and cognition.  Pt consumed a functional snack of regular textures and thin liquids with mod I use of swallowing precautions and no overt s/s of aspiration.  Pt was also able to complete 25 repetitions of EMST with mod I and a self perceived effort level of 8 out of 10.  Discussed parameters for stopping device use (pain, shortness of breath, lightheadedness or dizziness) and how to adjust device for increasing or decreasing resistance appropriately.  Therapist also facilitated the session with a novel scheduling task to address problem solving.  Pt needed overall supervision verbal cues to complete task thoroughly but was otherwise mod I for task organization and planning for 100% accuracy.  Pt was left in bed with call bell within reach. Continue per current plan of care.    Function:  Eating Eating   Modified Consistency Diet: No Eating Assist Level: No help, No cues           Cognition Comprehension Comprehension assist level: Understands complex 90% of the time/cues 10% of the time  Expression   Expression assist level: Expresses complex  90% of the time/cues < 10% of the time  Social Interaction Social Interaction assist level: Interacts appropriately with others with medication or extra time (anti-anxiety, antidepressant).  Problem Solving Problem solving assist level: Solves basic 90% of the time/requires cueing < 10% of the time  Memory Memory assist level: Recognizes or recalls 90% of the time/requires cueing < 10% of the time    Pain Pain Assessment Pain Assessment: No/denies pain   Therapy/Group: Individual Therapy  Nikolaj Geraghty, Selinda Orion 08/26/2017, 12:55 PM

## 2017-08-27 ENCOUNTER — Inpatient Hospital Stay (HOSPITAL_COMMUNITY): Payer: Medicare HMO | Admitting: Speech Pathology

## 2017-08-27 ENCOUNTER — Inpatient Hospital Stay (HOSPITAL_COMMUNITY): Payer: Medicare HMO | Admitting: Occupational Therapy

## 2017-08-27 ENCOUNTER — Inpatient Hospital Stay (HOSPITAL_COMMUNITY): Payer: Medicare HMO

## 2017-08-27 ENCOUNTER — Ambulatory Visit: Payer: Medicare HMO | Admitting: Nurse Practitioner

## 2017-08-27 DIAGNOSIS — W19XXXA Unspecified fall, initial encounter: Secondary | ICD-10-CM

## 2017-08-27 LAB — CREATININE, SERUM
CREATININE: 0.92 mg/dL (ref 0.44–1.00)
GFR calc non Af Amer: >60 mL/min (ref >60)

## 2017-08-27 MED ORDER — AMIODARONE HCL 200 MG PO TABS: 200.0000 mg | ORAL_TABLET | Freq: Every day | ORAL | 0 refills | Status: AC

## 2017-08-27 MED ORDER — PANTOPRAZOLE SODIUM 40 MG PO TBEC: 40.0000 mg | DELAYED_RELEASE_TABLET | Freq: Two times a day (BID) | ORAL | 0 refills | Status: DC

## 2017-08-27 MED ORDER — ATORVASTATIN CALCIUM 40 MG PO TABS: ORAL_TABLET | ORAL | 0 refills | Status: DC

## 2017-08-27 MED ORDER — AMLODIPINE BESYLATE 10 MG PO TABS: 10.0000 mg | ORAL_TABLET | Freq: Every day | ORAL | 0 refills | Status: DC

## 2017-08-27 MED ORDER — CLONIDINE HCL 0.1 MG PO TABS: 0.1000 mg | ORAL_TABLET | Freq: Two times a day (BID) | ORAL | 11 refills | Status: DC

## 2017-08-27 MED ORDER — CITALOPRAM HYDROBROMIDE 10 MG PO TABS: 10.0000 mg | ORAL_TABLET | Freq: Every day | ORAL | 1 refills | Status: DC

## 2017-08-27 MED ORDER — METOPROLOL TARTRATE 25 MG PO TABS: 12.5000 mg | ORAL_TABLET | Freq: Two times a day (BID) | ORAL | 0 refills | Status: DC

## 2017-08-27 MED ORDER — CLONAZEPAM 0.5 MG PO TABS: 0.5000 mg | ORAL_TABLET | Freq: Two times a day (BID) | ORAL | 0 refills | Status: DC | PRN

## 2017-08-27 MED ORDER — K PHOS MONO-SOD PHOS DI & MONO 155-852-130 MG PO TABS: 500.0000 mg | ORAL_TABLET | Freq: Three times a day (TID) | ORAL | 0 refills | Status: DC

## 2017-08-27 NOTE — Progress Notes (Signed)
Speech Language Pathology Discharge Summary  Patient Details  Name: Kathleen Guerrero MRN: [id number] Date of Birth: 03-12-1946  Today's Date: 08/27/2017 SLP Individual Time: 7282-0601 SLP Individual Time Calculation (min): 30 min   Skilled Therapeutic Interventions:  Pt was seen for skilled ST targeting cognitive goals.  SLP facilitated the session with medication management tasks to address problem solving and recall of new information.  Pt was able to recall function of medications when named for ~80% accuracy with mod I and SLP provided skilled instruction on function of newer medications with which pt was less familiar.  Discussed rationale for use of pill box and having either daughter or her friend assist pt in loading pill box once discharged home.  Pt verbalized understanding and was in agreement with recommendations.   Pt is discharging tomorrow with recommendations for follow up ST services at next level of care given that pt does not feel back to her baseline cognitively.  Pt was left in bed with bed alarm set and call bell within reach.      Patient has met 2 of 4 long term goals.  Patient to discharge at overall Supervision level.  Reasons goals not met: pt needs supervision for cognitive tasks    Clinical Impression/Discharge Summary:  Pt has made functional gains while inpatient and is discharging having met 2 out of 4 long term goals.  Pt is currently supervision-mod I for semi-complex tasks due to decreased recall of new information.  Pt's diet has been upgraded to regular textures and thin liquids which pt is consuming with mod I use of swallowing precautions.  Pt is discharging home with 24/7 supervision from family and friend and recommendations for outpatient ST follow up.    Care Partner:  Caregiver Able to Provide Assistance: Yes  Type of Caregiver Assistance: Physical;Cognitive  Recommendation:  Outpatient SLP  Rationale for SLP Follow Up: Maximize cognitive function  and independence   Equipment: none recommended by SLP    Reasons for discharge: Discharged from hospital   Patient/Family Agrees with Progress Made and Goals Achieved: Yes   Function:  Eating Eating   Modified Consistency Diet: No Eating Assist Level: No help, No cues           Cognition Comprehension Comprehension assist level: Understands complex 90% of the time/cues 10% of the time  Expression   Expression assist level: Expresses complex 90% of the time/cues < 10% of the time  Social Interaction Social Interaction assist level: Interacts appropriately 90% of the time - Needs monitoring or encouragement for participation or interaction.  Problem Solving Problem solving assist level: Solves basic 90% of the time/requires cueing < 10% of the time  Memory Memory assist level: Recognizes or recalls 90% of the time/requires cueing < 10% of the time   Emilio Math 08/27/2017, 2:49 PM

## 2017-08-27 NOTE — Progress Notes (Signed)
Nutrition Follow-up  DOCUMENTATION CODES:   Obesity unspecified  INTERVENTION:  Continue nutritional supplements post discharge especially if po intake becomes poor.   Encourage adequate PO intake.   NUTRITION DIAGNOSIS:   Increased nutrient needs related to  (therapy) as evidenced by estimated needs; ongoing   GOAL:   Patient will meet greater than or equal to 90% of their needs; met  MONITOR:   PO intake, Supplement acceptance, Diet advancement, Labs, Weight trends, Skin, I & O's  REASON FOR ASSESSMENT:   Consult Assessment of nutrition requirement/status  ASSESSMENT:   71 y.o.right handed female with past medical history of hypertension, hepatitis, tobacco abuse and CVA status post loop recorder September 2017.  On 08/01/2017 patient developed acute respiratory distress accompanied by crepitus over her right chest wall and was intubated. Chest x-ray showed small apical pneumothorax. A chest tube was later placed 08/03/2017 and has since been removed. Patient later extubated 08/11/2017. Initially with nasogastric tube feeds modified barium swallow completed diet has been advanced to dysphagia #2 thin liquids and nasogastric tube removed.  Meal completion has been 25-100%. Family has been bringing in food from outside between meals. Pt reports eating well currently with no other difficulties. Plans for discharge tomorrow. Recommend continuation of nutritional supplements post discharge is po intake becomes poor.   Diet Order:  Diet Heart Room service appropriate? Yes; Fluid consistency: Thin  Skin:   (Wound on head, incision on chest)  Last BM:  10/24  Height:   Ht Readings from Last 1 Encounters:  07/31/17 5' 2.75" (1.594 m)    Weight:   Wt Readings from Last 1 Encounters:  08/27/17 172 lb 7.2 oz (78.2 kg)    Ideal Body Weight:  51.7 kg  BMI:  Body mass index is 30.79 kg/m.  Estimated Nutritional Needs:   Kcal:  1600-1800  Protein:  85-100  grams  Fluid:  >/= 1.6 L/day  EDUCATION NEEDS:   Education needs addressed  Corrin Parker, MS, RD, LDN Pager # [phone] After hours/ weekend pager # [phone]

## 2017-08-27 NOTE — Discharge Instructions (Signed)
Inpatient Rehab Discharge Instructions  Kathleen Guerrero Discharge date and time: No discharge date for patient encounter.   Activities/Precautions/ Functional Status: Activity: activity as tolerated Diet: regular diet Wound Care: Keep wound clean and dry Functional status:  ___ No restrictions     ___ Walk up steps independently ___ 24/7 supervision/assistance   ___ Walk up steps with assistance ___ Intermittent supervision/assistance  ___ Bathe/dress independently ___ Walk with walker     _x__ Bathe/dress with assistance ___ Walk Independently    ___ Shower independently ___ Walk with assistance    ___ Shower with assistance ___ No alcohol     ___ Return to work/school ________  Special Instructions:    COMMUNITY REFERRALS UPON DISCHARGE:      Outpatient: PT, OT, SP  Agency:[facility] Phone:[phone]   Date of Last Service:08/28/2017  Appointment Date/Time:NOVEMBER 1 Thursday 9:00-11:45 AM ( ALL THREE THERAPIES)  Medical Equipment/Items Ordered:HAS EQUIPMENT FROM PREVIOUS ADMITS     GENERAL COMMUNITY RESOURCES FOR PATIENT/FAMILY: Support Groups:[facility] SUPPORT GROUP EVERY SECOND Thursday @ 3:00-4:00 PM ON THE REHAB UNIT QUESTIONS CONTACT [facility] [phone]   My questions have been answered and I understand these instructions. I will adhere to these goals and the provided educational materials after my discharge from the hospital.  Patient/Caregiver Signature _______________________________ Date __________  Clinician Signature _______________________________________ Date __________  Please bring this form and your medication list with you to all your follow-up doctor's appointments.

## 2017-08-27 NOTE — Progress Notes (Signed)
Physical Therapy Discharge Summary  Patient Details  Name: Kathleen Guerrero MRN: [id number] Date of Birth: 09-30-1946  Today's Date: 08/27/2017 PT Individual Time: 1100-1157 PT Individual Time Calculation (min): 57 min    Patient has met 4 of 6 long term goals due to improved activity tolerance, improved balance and increased strength.  Patient to discharge at an ambulatory level Supervision.   Patient's care partner is independent to provide the necessary supervsion assistance at discharge. Pt's daughter will be staying at home with the pt to provide supervision, her daughter has attended therapy sessions and aware of pts supervision needs for safety.   Reasons goals not met: Pt did not meet mod I goals for ambulation and dynamic balance secondary to continued balance deficits and poor awareness when using RW. Treatment team recommended staying longer to reach Mod I level however pt is persistent on d/c. Pt reports that she will have 24/7 supervision from her daughter upon d/c.   Recommendation:  Patient will benefit from ongoing skilled PT services in home health setting to continue to advance safe functional mobility, address ongoing impairments in balance, awareness, strength, and minimize fall risk.  Equipment: No equipment provided, pt has RW  Reasons for discharge: treatment goals met  Patient/family agrees with progress made and goals achieved: Yes   PT Treatment Interventions: Pt seated in w/c upon PT arrival, agreeable to therapy tx and denies pain. Discharge summary completed this session. Pt ambulated within the unit >150 ft using RW and supervision, verbal cues for safety and RW management. Pt performed transfers and bed mobility this session Mod I. Pt requires supervision and cueing for ambulation secondary to poor RW management, safety and impaired balance. Pt ascended/descended 4 steps with supervision using B handrails. Pt performed car transfer with RW and supervision, verbal  cues needed for RW placement. Berg balance test completed as detailed below, discussed results with pt and emphasis on using RW at all times. Pt ambulated to rehab apartment and performed bed mobility Mod I. Pt ambulated on uneven surfaces with min assist using RW. Reviewed HEP exercises and provided pt with a handout. Pt left supine in bed at end of session, requesting to rest and needs in reach.   PT Discharge Precautions/Restrictions Precautions Precautions: Fall Restrictions Weight Bearing Restrictions: No Cognition Overall Cognitive Status: Impaired/Different from baseline Arousal/Alertness: Awake/alert Orientation Level: Oriented X4 Focused Attention: Appears intact Sustained Attention: Appears intact Memory: Impaired Memory Impairment: Decreased recall of new information Awareness: Appears intact Problem Solving: Impaired Problem Solving Impairment: Functional complex;Functional basic Safety/Judgment: Impaired Comments: Pt requiring cueing for safe AD use with poor carryover from day to day  Sensation Sensation Light Touch: Appears Intact Proprioception: Appears Intact Coordination Gross Motor Movements are Fluid and Coordinated: Yes Fine Motor Movements are Fluid and Coordinated: Yes Motor  Motor Motor - Skilled Clinical Observations: generalized weakness  Trunk/Postural Assessment  Cervical Assessment Cervical Assessment: Within Functional Limits Thoracic Assessment Thoracic Assessment: Exceptions to [facility] (rounded shoulders) Lumbar Assessment Lumbar Assessment: Within Functional Limits Postural Control Postural Control: Within Functional Limits  Balance Berg Balance Test Sit to Stand: Able to stand  independently using hands Standing Unsupported: Able to stand safely 2 minutes Sitting with Back Unsupported but Feet Supported on Floor or Stool: Able to sit safely and securely 2 minutes Stand to Sit: Sits safely with minimal use of hands Transfers: Able to transfer  safely, definite need of hands Standing Unsupported with Eyes Closed: Able to stand 10 seconds with supervision Standing Ubsupported with  Feet Together: Able to place feet together independently and stand 1 minute safely From Standing, Reach Forward with Outstretched Arm: Can reach confidently >25 cm (10") From Standing Position, Pick up Object from Floor: Able to pick up shoe, needs supervision From Standing Position, Turn to Look Behind Over each Shoulder: Looks behind from both sides and weight shifts well Turn 360 Degrees: Able to turn 360 degrees safely but slowly Standing Unsupported, Alternately Place Feet on Step/Stool: Able to complete >2 steps/needs minimal assist Standing Unsupported, One Foot in Front: Loses balance while stepping or standing Standing on One Leg: Tries to lift leg/unable to hold 3 seconds but remains standing independently Total Score: 40 Extremity Assessment      RLE Assessment RLE Assessment: Exceptions to [facility] (strength grossly 4/5 throughout) LLE Assessment LLE Assessment: Exceptions to [facility] (strength grossly 4/5 throughout)   See Function Navigator for Current Functional Status.  Netta Corrigan, PT, DPT 08/27/2017, 11:27 AM

## 2017-08-27 NOTE — Discharge Summary (Signed)
Discharge summary job [id number]

## 2017-08-27 NOTE — Plan of Care (Signed)
Problem: RH Problem Solving Goal: LTG Patient will demonstrate problem solving for (SLP) LTG:  Patient will demonstrate problem solving for basic/complex daily situations with cues  (SLP)  Outcome: Not Met (add Reason) Supervision   Problem: RH Memory Goal: LTG Patient will use memory compensatory aids to (SLP) LTG:  Patient will use memory compensatory aids to recall biographical/new, daily complex information with cues (SLP)  Outcome: Not Met (add Reason) Supervision

## 2017-08-27 NOTE — Progress Notes (Signed)
Team conference noted for today. Pt is resting in bed quietly. Easily aroused and is admitted for respiratory failure s/p both intubation and extubation and new found UTI neing treated with Cipro. Pt is continent of b/b and ambulatory and is without any noted s/s of adverse reactions due to the abx therapy. Denies any pain at this time. S/P Fall and is without any noted cognition changes with stable v/s. Safety maintained. callbell within reach.will continue to monitor.

## 2017-08-27 NOTE — Progress Notes (Signed)
Pt. Had an assisted fall at the gym with therapist.Pt's daughter was present at that moment doing family teaching.Dan A. Was notified and came to assess the pt. RN assess the pt. As well,VS were check.Pt. Verbalized feeling fine excepts for a headache.Tylenol order were obtained and administer to pt.Some bleeding at the right frontal area was noticed.It stop after wiped couple times.Keep monitoring pt. Closely,and assessing her needs.

## 2017-08-27 NOTE — Progress Notes (Signed)
Social Work Patient ID: Kathleen Guerrero, female   DOB: 1946/07/10, 71 y.o.   MRN: [id number]  Met with pt to discuss team does feel she will be ready to go home tomorrow as long as she has supervision. She will have daughter then her friend, so she will have someone with her. Discussed follow up needs, she prefers OP since she did this before. Will contact [facility] and make referral.

## 2017-08-27 NOTE — Progress Notes (Signed)
[facility]     PROGRESS NOTE  Subjective/Complaints:  Pt seen laying in bed this AM. She slept fairly overnight.  She had a fall in therapies yesterday, hitting her head with some bleeding.  She is stable and denies complaints.  She is excited about d/c tomorrow. She has questions about when she can drive again.   ROS: Denies CP, SOB, N/V/D.  Objective: Vital Signs: Blood pressure (!) 150/73, pulse 81, temperature 98.9 F (37.2 C), temperature source Oral, resp. rate 20, weight 78.2 kg (172 lb 7.2 oz), SpO2 99 %. No results found. No results for input(s): WBC, HGB, HCT, PLT in the last 72 hours.  Recent Labs  08/27/17 0111  CREATININE 0.92   CBG (last 3)  No results for input(s): GLUCAP in the last 72 hours.  Wt Readings from Last 3 Encounters:  08/27/17 78.2 kg (172 lb 7.2 oz)  08/20/17 81 kg (178 lb 9.2 oz)  07/28/17 85.7 kg (189 lb)    Physical Exam:  BP (!) 150/73 (BP Location: Left Arm)   Pulse 81   Temp 98.9 F (37.2 C) (Oral)   Resp 20   Wt 78.2 kg (172 lb 7.2 oz)   SpO2 99%   BMI 30.79 kg/m  Constitutional: She appears well-developed and well-nourished.  HENT: Normocephalic and minor trauma at right temporal region.  Eyes: EOM are normal. No discharge.  Cardiovascular: RRR.  No JVD. Respiratory: Effort normal and breath sounds normal.  GI: Bowel sounds are normal. She exhibits no distension.  Musculoskeletal: She exhibits no edema or tenderness.  Neurological: She is alert and oriented.  Motor: B/l UE 4+/5 proximal to distal (unchanged) B/l LE: 4+/5 proximal to distal   Skin: Skin is warm and dry. Cyst with eschar posterior head Psychiatric: She has a normal mood and affect. Her behavior is normal.   Assessment/Plan: 1. Functional deficits secondary to debility which require 3+ hours per day of interdisciplinary therapy in a comprehensive inpatient rehab setting. Physiatrist is providing close team supervision and 24  hour management of active medical problems listed below. Physiatrist and rehab team continue to assess barriers to discharge/monitor patient progress toward functional and medical goals.  Function:  Bathing Bathing position   Position: Shower  Bathing parts Body parts bathed by patient: Right upper leg, Left upper leg, Abdomen Body parts bathed by helper: Back  Bathing assist Assist Level: Touching or steadying assistance(Pt > 75%)      Upper Body Dressing/Undressing Upper body dressing   What is the patient wearing?: Pull over shirt/dress Bra - Perfomed by patient: Thread/unthread right bra strap, Thread/unthread left bra strap, Hook/unhook bra (pull down sports bra)   Pull over shirt/dress - Perfomed by patient: Thread/unthread right sleeve, Thread/unthread left sleeve, Put head through opening, Pull shirt over trunk          Upper body assist Assist Level: Set up      Lower Body Dressing/Undressing Lower body dressing   What is the patient wearing?: Shoes, Underwear Underwear - Performed by patient: Thread/unthread right underwear leg, Thread/unthread left underwear leg, Pull underwear up/down   Pants- Performed by patient: Thread/unthread right pants leg, Thread/unthread left pants leg, Pull pants up/down     Non-skid slipper socks- Performed by helper: Don/doff right sock, Don/doff left sock     Shoes - Performed by patient: Don/doff right shoe, Don/doff left shoe            Lower body assist Assist for lower  body dressing: Supervision or verbal cues      Toileting Toileting   Toileting steps completed by patient: Adjust clothing prior to toileting, Performs perineal hygiene Toileting steps completed by helper: Adjust clothing prior to toileting, Performs perineal hygiene, Adjust clothing after toileting (per Ivery Quale, NT) Toileting Assistive Devices: Grab bar or rail  Toileting assist Assist level: More than reasonable time   Transfers Chair/bed  transfer   Chair/bed transfer method: Ambulatory Chair/bed transfer assist level: Supervision or verbal cues Chair/bed transfer assistive device: Armrests     Locomotion Ambulation     Max distance: >150' Assist level: Supervision or verbal cues   Wheelchair   Type: Manual Max wheelchair distance: 79' Assist Level: Supervision or verbal cues  Cognition Comprehension Comprehension assist level: Understands complex 90% of the time/cues 10% of the time  Expression Expression assist level: Expresses complex 90% of the time/cues < 10% of the time  Social Interaction Social Interaction assist level: Interacts appropriately with others with medication or extra time (anti-anxiety, antidepressant).  Problem Solving Problem solving assist level: Solves basic 90% of the time/requires cueing < 10% of the time  Memory Memory assist level: Recognizes or recalls 90% of the time/requires cueing < 10% of the time    Medical Problem List and Plan: 1.  Debilitation secondary to respiratory failure after VATS procedure with right upper lobe wedge resection with biopsy positive for adenocarcinoma  Plan for d/c tomorrow  Will follow up on plans for Heme/Onc follow up  Encouraged IS 2.  DVT Prophylaxis/Anticoagulation:  Subcutaneous  Lovenox. Monitor for any bleeding episodes 3. Pain Management: Tylenol as needed 4. Mood:  Klonopin 0.5 mg twice a day as needed anxiety, Risperdal 0.5 mg twice a day as needed agitation  Celexa 10 mg started on 10/21  5. Neuropsych: This patient is capable of making decisions on her own behalf. 6. Skin/Wound Care: Routine skin checks 7. Fluids/Electrolytes/Nutrition: Routine I&Os  Cr. WNL on 10/24 8. Dysphagia.: Resolved    Advanced to regular diet 9. Acute blood loss anemia.   Hb 11.2 on 10/19 10. Pneumothorax. Chest tube removed 11. Hypertension. Norvasc 10 mg daily, clonidine 0.1 mg twice a day, Lopressor 12.5 mg twice a day.   Overall controlled on 10/24 12.  PAF. Amiodarone 200 mg daily.  13. History of CVA September 2017. Status post loop recorder 14. Hyperlipidemia. Lipitor 15. Tobacco abuse. Counseling 16. Labile sodium:   Na+ 135 on 10/19 17. Leukocytosis: Resolved 18. Hypoalbuminemia  Supplement initiated 10/18 19. Bowel incontinence  Bowel program initiated  Improved 20. Urinary frequency/incontinence - likely secondary from UTI  PVRs unremarkable  Escherichia coli and Klebsiella  Intermediate resistant Macrobid, Cipro started on 10/22-10/24 21. Fall with head trauma  Cont Neuro checks  Appears stable at present  LOS (Days) 7 A FACE TO FACE EVALUATION WAS PERFORMED  Ankit Lorie Phenix 08/27/2017 8:08 AM

## 2017-08-27 NOTE — Progress Notes (Signed)
Social Work Patient ID: Kathleen Guerrero, female   DOB: February 06, 1946, 71 y.o.   MRN: [id number]   Spoke with daughter-kate via telephone to answer her questions regarding discharge tomorrow. She plans to be here at 10:00 to go over discharge instructions with PA. Pleased with the discharge and feels Mom will do better at home.

## 2017-08-27 NOTE — Patient Care Conference (Signed)
Inpatient RehabilitationTeam Conference and Plan of Care Update Date: 08/27/2017   Time: 2:30 PM    Patient Name: Kathleen Guerrero      Medical Record Number: [id number]  Date of Birth: [birth year] Sex: Female         Room/Bed: 4M06C/4M06C-01 Payor Info: Payor: [employer] MEDICARE / Plan: [place] MEDICARE HMO/PPO / Product Type: *No Product type* /    Admitting Diagnosis: Tendidern Debility  Admit Date/Time:  08/20/2017  6:11 PM Admission Comments: No comment available   Primary Diagnosis:  <principal problem not specified> Principal Problem: <principal problem not specified>  Patient Active Problem List   Diagnosis Date Noted  . Fall   . Recurrent major depressive disorder, in partial remission ([place])   . Acute lower UTI   . Labile blood pressure   . Essential hypertension   . Urinary frequency   . Hyponatremia   . Hypoalbuminemia due to protein-calorie malnutrition ([place])   . Incontinence of feces   . Critical illness myopathy 08/20/2017  . Debility   . Adenocarcinoma ([place])   . Anxiety state   . Agitation   . Dysphagia   . Acute blood loss anemia   . History of pneumothorax   . PAF (paroxysmal atrial fibrillation) ([place])   . History of CVA (cerebrovascular accident)   . Hyperlipidemia   . Hypernatremia   . Leukocytosis   . S/P lobectomy of lung 07/30/2017  . Lower extremity edema 04/30/2017  . TIA (transient ischemic attack) 01/16/2017  . Dyslipidemia, goal LDL below 70 01/03/2017  . Diarrhea 09/12/2016  . Sleep disturbance 07/26/2016  . Overactive bladder 07/26/2016  . Tobacco abuse 07/21/2016  . Solitary pulmonary nodule 07/21/2016  . Acute CVA (cerebrovascular accident) ([place]) 07/21/2016  . Acute lacunar stroke ([place]) 07/20/2016  . Hypokalemia 07/20/2016  . Benign essential HTN 07/20/2016    Expected Discharge Date: Expected Discharge Date: 08/28/17  Team Members Present: Physician leading conference: Dr. Delice Lesch Social Worker Present: Ovidio Kin, LCSW Nurse  Present: Arelia Sneddon, RN PT Present: Michaelene Song, PT OT Present: Clyda Greener, OT SLP Present: Windell Moulding, SLP PPS Coordinator present : Daiva Nakayama, RN, CRRN     Current Status/Progress Goal Weekly Team Focus  Medical   Debilitation secondary to respiratory failure after VATS procedure with right upper lobe wedge resection with biopsy positive for adenocarcinoma  Improve mobility, safety, bladder sympstoms  See above   Bowel/Bladder             Swallow/Nutrition/ Hydration   upgraded to regular textures  mod I   goals met    ADL's   Pt currently modified independent for UB selfcare and supervision for LB selfcare and transfers.  Needs cueing for use of RW safely.  Still with increased fatigue and decreased endurance.   supervision to modified independent  selfcare retraining, transfer training, balance retraining, DME/AE education   Mobility   supervision gait over 150', supervision transfers and bed mobility, Min A 4 stairs   supervision to Mod I overall if D/C home alone   D/C planning, endurance, balance, LE strengthening   Communication             Safety/Cognition/ Behavioral Observations  supervision-mod I   mod I   no family education has been completed at this time due to daughter not being present for therapy sessions, pt education is complete at this time.   Pain             Skin                *  See Care Plan and progress notes for long and short-term goals.     Barriers to Discharge  Current Status/Progress Possible Resolutions Date Resolved   Physician    Medical stability;Incontinence     See above  Therapies, follow labs, optimize HTN, abx, requesting to go home      Nursing                  PT  Inaccessible home environment;Decreased caregiver support;Home environment access/layout;Lack of/limited family support;Behavior  Increased anxiety over last few days, wants to go home, may not be ready  supevision to Min A overall            OT                   SLP                SW                Discharge Planning/Teaching Needs:  Home with daughter who is here until Tuesday and then to friend's home. Aware recommendation is 24 hr supervision level      Team Discussion:  Reaching goals of supervision level. Daughter and friend to provide this level of care. MD addressed with pt driving at DC. Pt requesting to DC tomorrow-team in agreement with this plan. Medically stable for DC  Revisions to Treatment Plan:  DC 10/25    Continued Need for Acute Rehabilitation Level of Care: The patient requires daily medical management by a physician with specialized training in physical medicine and rehabilitation for the following conditions: Daily direction of a multidisciplinary physical rehabilitation program to ensure safe treatment while eliciting the highest outcome that is of practical value to the patient.: Yes Daily medical management of patient stability for increased activity during participation in an intensive rehabilitation regime.: Yes Daily analysis of laboratory values and/or radiology reports with any subsequent need for medication adjustment of medical intervention for : Pulmonary problems;Post surgical problems;Urological problems;Other  DupreeGardiner Rhyme 08/27/2017, 2:48 PM

## 2017-08-27 NOTE — Progress Notes (Signed)
Occupational Therapy Discharge Summary  Patient Details  Name: Kathleen Guerrero MRN: [id number] Date of Birth: 1946/03/08  Today's Date: 08/27/2017 OT Individual Time: 1501-1531 OT Individual Time Calculation (min): 30 min   Session Note:  Pt worked on Mining engineer transfers with use of the RW and tub bench.  Discussed the need for tub bench as well as 3:1 for home.  Pt reports she will order them on-line.  Feel she will likely not do this and will just try to "get by" with the small shower seat she has.  Pt needed mod instructional cueing throughout session for all transfers in order to turn the walker all the way around with her and to back up to the surface and reach back before sitting.   Had pt complete functional mobility to and from the ortho gym as well with close supervision.  Completed transfers to the couch and bed with supervision using the RW as well.  Verbally reviewed the proper technique for transporting items around the kitchen with use of the RW.  Pt was able to verbalize correct procedure.  Therapist also discussed the need for a walker bag as well, which pt reports that she has.  Finished session with return to the room and pt transferring back to bed per her choice.  Call button and phone in reach.    Patient has met 3 of 10 long term goals due to improved activity tolerance, improved balance, postural control, ability to compensate for deficits, improved attention and improved awareness.  Patient to discharge at overall Supervision level.  Patient's care partner unavailable to provide the necessary assistance.  Pt's daughter will be with pt until next week but was not in for family education during OT sessions.  Pt eager to discharge home and is at a supervision level overall.  Feel with 24 hour initial supervision she should be OK but do not feel she will be safe at this time without supervision.     Reasons goals not met: Only 3/10 LTGs met secondary to pt wanting to d/c  home sooner than expected.    Recommendation:  Patient will benefit from ongoing skilled OT services in outpatient setting to continue to advance functional skills in the area of BADL, iADL and Reduce care partner burden.  Equipment: No equipment provided Recommended shower bench and 3:1.  Pt states she will "order them on [place]".    Reasons for discharge: discharge from hospital  Patient/family agrees with progress made and goals achieved: Yes (patient)  OT Discharge Precautions/Restrictions  Precautions Precautions: Fall Restrictions Weight Bearing Restrictions: No   Vital Signs Therapy Vitals Temp: 98.2 F (36.8 C) Temp Source: Oral Pulse Rate: 67 Resp: 18 BP: (!) 113/53 Patient Position (if appropriate): Lying Oxygen Therapy SpO2: 96 % O2 Device: Not Delivered Pain Pain Assessment Pain Assessment: No/denies pain ADL See Function Section of chart for details  Vision Baseline Vision/History: Wears glasses Wears Glasses: Reading only Patient Visual Report: No change from baseline Vision Assessment?: No apparent visual deficits Eye Alignment: Within Functional Limits Perception  Perception: Within Functional Limits Praxis Praxis: Intact Cognition Overall Cognitive Status: Impaired/Different from baseline Arousal/Alertness: Awake/alert Orientation Level: Oriented X4 Attention: Sustained Focused Attention: Appears intact Sustained Attention: Appears intact Memory: Impaired Memory Impairment: Decreased recall of new information Awareness: Impaired Awareness Impairment: Anticipatory impairment Problem Solving: Impaired Problem Solving Impairment: Functional complex Safety/Judgment: Impaired Comments: Pt with decreased memory of hand placement for transitional movements as well as walker usage day to day.  Sensation Sensation Light Touch: Appears Intact Stereognosis: Appears Intact Hot/Cold: Appears Intact Proprioception: Appears  Intact Coordination Gross Motor Movements are Fluid and Coordinated: Yes Fine Motor Movements are Fluid and Coordinated: Yes Motor  Motor Motor - Discharge Observations: Generalized weakness and decreased overall endurance.  Mobility  Transfers Transfers: Sit to Stand;Stand to Sit Sit to Stand: From chair/3-in-1;With upper extremity assist;5: Supervision Stand to Sit: 5: Supervision;With upper extremity assist;To chair/3-in-1  Trunk/Postural Assessment  Cervical Assessment Cervical Assessment: Within Functional Limits Thoracic Assessment Thoracic Assessment: Exceptions to [facility] (slight thoracic kyphosis) Lumbar Assessment Lumbar Assessment: Within Functional Limits Postural Control Postural Control: Within Functional Limits  Balance Balance Balance Assessed: Yes Static Sitting Balance Static Sitting - Balance Support: No upper extremity supported Static Sitting - Level of Assistance: 7: Independent Dynamic Sitting Balance Dynamic Sitting - Balance Support: No upper extremity supported;During functional activity Dynamic Sitting - Level of Assistance: 6: Modified independent (Device/Increase time) Static Standing Balance Static Standing - Balance Support: During functional activity Static Standing - Level of Assistance: 5: Stand by assistance Dynamic Standing Balance Dynamic Standing - Balance Support: During functional activity Dynamic Standing - Level of Assistance: 5: Stand by assistance Extremity/Trunk Assessment RUE Assessment RUE Assessment: Within Functional Limits (strength 4/5 throughout with AROM WFLS for gross testing.) LUE Assessment LUE Assessment: Within Functional Limits (strength 4/5 throughout for all joints.  )   See Function Navigator for Current Functional Status.  Ankur Snowdon OTR/L 08/27/2017, 4:36 PM

## 2017-08-27 NOTE — Progress Notes (Signed)
Pt. Resting comfortable on bed.Verbalized that the headache was resolved.Keep monitoring closely.

## 2017-08-28 ENCOUNTER — Inpatient Hospital Stay (HOSPITAL_COMMUNITY): Payer: Medicare HMO | Admitting: Speech Pathology

## 2017-08-28 ENCOUNTER — Inpatient Hospital Stay (HOSPITAL_COMMUNITY): Payer: Medicare HMO | Admitting: Physical Therapy

## 2017-08-28 ENCOUNTER — Inpatient Hospital Stay (HOSPITAL_COMMUNITY): Payer: Medicare HMO | Admitting: Occupational Therapy

## 2017-08-28 NOTE — Progress Notes (Signed)
Social Work  Discharge Note  The overall goal for the admission was met for:   Discharge location: Yes-HOME WITH DAUGHTER WHO IS HERE UNTIL 10/30 THEN TO [place]  Length of Stay: Yes-9 DAYS  Discharge activity level: Yes-SUPERVISION LEVEL  Home/community participation: Yes  Services provided included: MD, RD, PT, OT, SLP, RN, CM, Pharmacy, Neuropsych and SW  Financial Services: Private Insurance: [place]  Follow-up services arranged: Outpatient: [facility],OT,SP-11/1 9:15-11:45   Comments (or additional information):  Patient/Family verbalized understanding of follow-up arrangements: Yes  Individual responsible for coordination of the follow-up plan: SELF & KATE-DAUGHTER  Confirmed correct DME delivered: Elease Hashimoto 08/28/2017    Elease Hashimoto

## 2017-08-28 NOTE — Progress Notes (Signed)
Occupational Therapy Session Note  Patient Details  Name: Kathleen Guerrero       Late Entry for 08/27/17 MRN: [id number] Date of Birth: 1946/02/21  Today's Date: 08/28/2017 OT Individual Time: 9:01-10:01      Short Term Goals: Week 1:  OT Short Term Goal 1 (Week 1): STGs equal to LTGs based on ELOS.   Skilled Therapeutic Interventions/Progress Updates:    Pt completed bathing and dressing to begin session.  She was able to ambulate with the RW to and from the shower with supervision and min instructional cueing for staying inside of the walker and not turning away from it when gathering clothing prior to shower.  She was able to complete bathing sit to stand on the shower bench, but exhibits poor thoroughness without awareness of this.  When cued that she hadn't washed her buttocks yet, she responded "Oh I did already?", even though she had not attempted it.  When shower was completed she transferred out to the wheelchair for dressing, which was also completed at supervision level.   Next had pt ambulate down to the therapy gym where she sat on a mat and worked on BUE strengthening using light orange theraband.  She completed 2 sets of 10 repetitions for shoulder flexion bilaterally and horizontal abduction.  Min assist needed for proper technique and decreased speed.  Also had pt use 2 lb dowel rod for shoulder flexion while engaged in hitting beach ball back to the therapist.  She was able to complete 8-10 reps before fatiguing and needed to rest.  Ambulated back to room at end of session with pt choosing to sit in her wheelchair in order to wait for next session.  Call button and phone in reach.    Therapy Documentation Precautions:  Precautions Precautions: Fall Restrictions Weight Bearing Restrictions: No  Other Treatments:    See Function Navigator for Current Functional Status.   Therapy/Group: Individual Therapy  Saylah Ketner OTR/L 08/28/2017, 12:17 PM

## 2017-08-28 NOTE — Progress Notes (Signed)
Pt resting in bed quietly. Easily aroused. Denies any pain at this time. C/o diarrhea with two episodes. immodium administered and effective. amblulatory and steady with rolling walker. Looking forward to d/c this am. Will continue to monitor.

## 2017-08-28 NOTE — Discharge Summary (Signed)
Kathleen Guerrero, Kathleen Guerrero                  ACCOUNT NO.:  [id number]  MEDICAL RECORD NO.:  [id number]  LOCATION:                                 FACILITY:  PHYSICIAN:  Delice Lesch, MD        DATE OF BIRTH:  1946/04/21  DATE OF ADMISSION:  08/20/2017 DATE OF DISCHARGE:  08/28/2017                              DISCHARGE SUMMARY   DISCHARGE DIAGNOSES: 1. Debilitation secondary to respiratory failure, with VATS procedure     with right upper lobe wedge resection biopsy positive for     adenocarcinoma. 2. Subcutaneous Lovenox for DVT prophylaxis. 3. Depression. 4. Dysphagia, resolved. 5. Acute blood loss anemia. 6. PAF. 7. Hypertension. 8. History of cerebrovascular accident. 9. Hyperlipidemia. 10.Tobacco abuse.  This is a 71 year old right-handed female with history of hypertension, hepatitis, tobacco abuse, CVA with loop recorder September, 2017.  Lives alone, independent prior to admission still working.  As part of a workup from CVA 2017, she had a CT of her neck that showed a right upper lobe ground-glass opacity.  CT of the chest showed a 13 x 9 x 8 mm opacity and follow up recommended.  She was seen outpatient with CT in November showing no significant change followed by Dr. Melvyn Novas.  Recently another followup scan showed increase in size of 13 x 20 mm, admitted July 17, 2017, for ongoing evaluation and care of right upper lobe wedge resection, biopsy of adenocarcinoma, underwent VATS procedure, thoracic right upper lobe, lymph node dissection July 30, 2017, per Dr. Roxan Hockey.  HOSPITAL COURSE AND PAIN MANAGEMENT:  The patient developed acute respiratory distress over the right chest wall with crepitus.  She was intubated.  Chest x-ray showed small apical pneumothorax.  Chest tube was placed, later removed, extubated August 11, 2017.  CT of the head was negative.  Hospital Course:  Acute blood loss anemia.  She did require nasogastric tube feeds for a short time for  nutritional support.  Subcutaneous Lovenox for DVT prophylaxis.  The patient was admitted for a comprehensive rehab program.  PAST MEDICAL HISTORY:  See discharge diagnoses.  SOCIAL HISTORY:  Lives alone, independent prior to admission.  FUNCTIONAL STATUS UPON ADMISSION TO REHAB SERVICES:  Minimal assist, 30 feet, rolling walker; moderate assist stand pivot transfers; max total assist activities of daily living.  PHYSICAL EXAMINATION:  VITAL SIGNS: Blood pressure 116/66, pulse 80, temperature 98, and respirations 22. GENERAL: This was an alert female, in no acute distress and oriented x3. HEENT: EOMs intact. NECK: Supple.  Nontender.  No JVD. CARDIAC: Rate controlled. ABDOMEN: Soft, nontender.  Good bowel sounds. LUNGS: Clear to auscultation without wheeze.  REHABILITATION HOSPITAL COURSE:  The patient was admitted to inpatient rehab services with therapies initiated on a 3-hour daily basis, consisting of physical therapy, occupational therapy, speech therapy, and rehabilitation nursing.  The following issues were addressed during the patient's rehabilitation stay.  Pertaining to Ms. Malachi Carl, VATS procedure for right upper lobe wedge resection for biopsy that was positive for adenocarcinoma surgical site healing nicely.  She would follow up with Dr. Roxan Hockey.  It would be Dr. Leonarda Salon protocol if the patient was to follow up with  Oncology Services.  Subcutaneous Lovenox for DVT prophylaxis.  No bleeding episodes noted.  Mood, bouts of depression.  She continued on Celexa, which improved with increased mobility.  She was participating fully with her therapies.  Blood pressure is well controlled on Norvasc, clonidine, and Lopressor.  Acute blood loss anemia stable at 11.2.  Noted PAF.  Maintained on low-dose amiodarone.  Noted history of CVA on September, 2017.  She did have a loop recorder.  History of tobacco abuse.  She received full counsel regard to cessation of  nicotine products.  Bouts of urinary frequency and incontinence.  Urine study did show E. coli Klebsiella, placed on Cipro, antibiotics since discontinued, she remained afebrile.  The patient received weekly collaborative interdisciplinary team conferences to discuss estimated length of stay, family teaching, any barriers to discharge.  She was at an ambulatory level supervision.  Her daughter would be staying with her on discharge.  Modified independent in her room, was noted during her rehab therapies on October 24 while doing transfers.  She did lose her balance and fall.  No loss of consciousness.  All issues in regard for safety were advised.  She would continue with a rolling walker.  She could gather her belongings for activities of daily living and homemaking.  DISCHARGE MEDICATIONS: 1. Amiodarone 200 mg p.o. daily. 2. Norvasc 10 mg p.o. daily. 3. Lipitor 40 mg p.o. daily. 4. Celexa 10 mg p.o. daily. 5. Clonidine 0.1 mg p.o. b.i.d. 6. Lopressor 12.5 mg p.o. b.i.d. 7. Protonix 40 mg p.o. every 12 hours. 8. Phosphorus 500 mg p.o. t.i.d.  DIET:  Her diet was regular.  FOLLOWUP:  The patient would follow with Dr. Delice Lesch at the Outpatient Rehab Service office as directed.  Dr. Modesto Charon, call for appointment.  Dr. Christinia Gully, call for appointment.  Dr. Mauricio Po, Medical management.     Lauraine Rinne, P.A.   ______________________________ Delice Lesch, MD    DA/MEDQ  D:  08/27/2017  T:  08/27/2017  Job:  [id number]  cc:   Christena Deem. Melvyn Novas, MD, FCCP Revonda Standard. Roxan Hockey, M.D. Delice Lesch, MD Dr. Mauricio Po

## 2017-08-28 NOTE — Progress Notes (Signed)
Pt d/c with personal belongings. Daughter at bedside with pt and d/c instructions provided to pt by D. Rid[place] PA-C.  Pt and daughter verbalized d/c instructions.

## 2017-08-28 NOTE — Progress Notes (Signed)
[facility] PHYSICAL MEDICINE & REHABILITATION     PROGRESS NOTE  Subjective/Complaints:  Patient seen lying in bed this morning. She states she slept well overnight. She states she is ready for DC today. She is appreciative of her care.  ROS: Denies CP, SOB, N/V/D.  Objective: Vital Signs: Blood pressure (!) 103/49, pulse 71, temperature 97.8 F (36.6 C), temperature source Oral, resp. rate 18, weight 79.7 kg (175 lb 11.3 oz), SpO2 99 %. No results found. No results for input(s): WBC, HGB, HCT, PLT in the last 72 hours.  Recent Labs  08/27/17 0111  CREATININE 0.92   CBG (last 3)  No results for input(s): GLUCAP in the last 72 hours.  Wt Readings from Last 3 Encounters:  08/28/17 79.7 kg (175 lb 11.3 oz)  08/20/17 81 kg (178 lb 9.2 oz)  07/28/17 85.7 kg (189 lb)    Physical Exam:  BP (!) 103/49 (BP Location: Left Arm)   Pulse 71   Temp 97.8 F (36.6 C) (Oral)   Resp 18   Wt 79.7 kg (175 lb 11.3 oz)   SpO2 99%   BMI 31.37 kg/m  Constitutional: She appears well-developed and well-nourished.  HENT: Normocephalic and minor trauma at right temporal region.  Eyes: EOM are normal. No discharge.  Cardiovascular: RRR.  No JVD. Respiratory: Effort Normal and breath sounds normal.  GI: Bowel sounds are normal. She exhibits no distension.  Musculoskeletal: She exhibits no edema or tenderness.  Neurological: She is alert and oriented.  Motor: B/l UE 4+/5 proximal to distal (stable) B/l LE: 4+/5 proximal to distal   Skin: Skin is warm and dry. Cyst with eschar posterior head Psychiatric: She has a normal mood and affect. Her behavior is normal.   Assessment/Plan: 1. Functional deficits secondary to debility which require 3+ hours per day of interdisciplinary therapy in a comprehensive inpatient rehab setting. Physiatrist is providing close team supervision and 24 hour management of active medical problems listed below. Physiatrist and rehab team continue to assess barriers to  discharge/monitor patient progress toward functional and medical goals.  Function:  Bathing Bathing position   Position: Shower  Bathing parts Body parts bathed by patient: Right upper leg, Left upper leg, Abdomen Body parts bathed by helper: Back  Bathing assist Assist Level: Touching or steadying assistance(Pt > 75%)      Upper Body Dressing/Undressing Upper body dressing   What is the patient wearing?: Pull over shirt/dress Bra - Perfomed by patient: Thread/unthread right bra strap, Thread/unthread left bra strap, Hook/unhook bra (pull down sports bra)   Pull over shirt/dress - Perfomed by patient: Thread/unthread right sleeve, Thread/unthread left sleeve, Put head through opening, Pull shirt over trunk          Upper body assist Assist Level: Set up      Lower Body Dressing/Undressing Lower body dressing   What is the patient wearing?: Socks, Shoes, Underwear Underwear - Performed by patient: Thread/unthread right underwear leg, Thread/unthread left underwear leg, Pull underwear up/down   Pants- Performed by patient: Thread/unthread right pants leg, Thread/unthread left pants leg, Pull pants up/down, Fasten/unfasten pants   Non-skid slipper socks- Performed by patient: Don/doff right sock, Don/doff left sock Non-skid slipper socks- Performed by helper: Don/doff right sock, Don/doff left sock Socks - Performed by patient: Don/doff right sock, Don/doff left sock   Shoes - Performed by patient: Don/doff right shoe, Don/doff left shoe, Fasten right, Fasten left            Lower body  assist Assist for lower body dressing: More than reasonable time      Toileting Toileting   Toileting steps completed by patient: Adjust clothing prior to toileting, Performs perineal hygiene, Adjust clothing after toileting Toileting steps completed by helper: Adjust clothing prior to toileting, Performs perineal hygiene, Adjust clothing after toileting (per Ivery Quale, NT) Toileting  Assistive Devices: Grab bar or rail  Toileting assist Assist level: More than reasonable time   Transfers Chair/bed transfer   Chair/bed transfer method: Stand pivot Chair/bed transfer assist level: No Help, no cues, assistive device, takes more than a reasonable amount of time Chair/bed transfer assistive device: Armrests     Locomotion Ambulation     Max distance: >150' Assist level: Supervision or verbal cues   Wheelchair   Type: Manual Max wheelchair distance: 75' Assist Level: Supervision or verbal cues  Cognition Comprehension Comprehension assist level: Follows complex conversation/direction with extra time/assistive device  Expression Expression assist level: Expresses complex ideas: With extra time/assistive device  Social Interaction Social Interaction assist level: Interacts appropriately with others - No medications needed.  Problem Solving Problem solving assist level: Solves complex problems: With extra time  Memory Memory assist level: Recognizes or recalls 90% of the time/requires cueing < 10% of the time    Medical Problem List and Plan: 1.  Debilitation secondary to respiratory failure after VATS procedure with right upper lobe wedge resection with biopsy positive for adenocarcinoma  DC today per patient request  Will see patient for transitional care management in 1-2 weeks  Will follow up with Heme/Onc as outpt  Encouraged IS 2.  DVT Prophylaxis/Anticoagulation:  Subcutaneous  Lovenox. Monitor for any bleeding episodes 3. Pain Management: Tylenol as needed 4. Mood:  Klonopin 0.5 mg twice a day as needed anxiety, Risperdal 0.5 mg twice a day as needed agitation  Celexa 10 mg started on 10/21  5. Neuropsych: This patient is capable of making decisions on her own behalf. 6. Skin/Wound Care: Routine skin checks 7. Fluids/Electrolytes/Nutrition: Routine I&Os  Cr. WNL on 10/24 8. Dysphagia.: Resolved    Advanced to regular diet 9. Acute blood loss anemia.    Hb 11.2 on 10/19 10. Pneumothorax. Chest tube removed 11. Hypertension. Norvasc 10 mg daily, clonidine 0.1 mg twice a day, Lopressor 12.5 mg twice a day.   Overall controlled on 10/25 12. PAF. Amiodarone 200 mg daily.  13. History of CVA September 2017. Status post loop recorder 14. Hyperlipidemia. Lipitor 15. Tobacco abuse. Counseling 16. Labile sodium:   Na+ 135 on 10/19 17. Leukocytosis: Resolved 18. Hypoalbuminemia  Supplement initiated 10/18 19. Bowel incontinence  Bowel program initiated  Improved 20. Urinary frequency/incontinence - likely secondary from UTI  PVRs unremarkable  Escherichia coli and Klebsiella  Intermediate resistant Macrobid, Cipro completed 10/22-10/24 21. Fall with head trauma  Cont Neuro checks  Appears stable at present  LOS (Days) 8 A FACE TO FACE EVALUATION WAS PERFORMED  Lyana Asbill Lorie Phenix 08/28/2017 8:52 AM

## 2017-08-28 NOTE — Progress Notes (Signed)
Suture to right chest tube site removed.

## 2017-08-29 ENCOUNTER — Telehealth: Payer: Self-pay | Admitting: *Deleted

## 2017-08-29 NOTE — Telephone Encounter (Signed)
Transitional Care call completed, appointment confirmed, address confirmed, new patient packet sent.  Transitional Care Questions   Questions for our staff to ask patients on Transitional care 48 hour phone call:   1. Are you/is patient experiencing any problems since coming home? No Are there any questions regarding any aspect of care? No  2. Are there any questions regarding medications administration/dosing? No  Are meds being taken as prescribed? Yes  Patient should review meds with caller to confirm   3. Have there been any falls?  No  4. Has Home Health been to the house and/or have they contacted you? Patient referred directly to outpatient therapies If not, have you tried to contact them? Can we help you contact them?   5. Are bowels and bladder emptying properly? Yes  Are there any unexpected incontinence issues? No If applicable, is patient following bowel/bladder programs?   6. Any fevers, problems with breathing, unexpected pain? No  7. Are there any skin problems or new areas of breakdown? No  8. Has the patient/family member arranged specialty MD follow up (ie cardiology/neurology/renal/surgical/etc)? In process  Can we help arrange? No  9. Does the patient need any other services or support that we can help arrange? No  10. Are caregivers following through as expected in assisting the patient? Yes  11. Has the patient quit smoking, drinking alcohol, or using drugs as recommended? No drink, No smoke, No illicit drug use

## 2017-09-01 ENCOUNTER — Telehealth: Payer: Self-pay

## 2017-09-01 ENCOUNTER — Telehealth: Payer: Self-pay | Admitting: *Deleted

## 2017-09-01 NOTE — Telephone Encounter (Signed)
Spoke with patient, requested manual Carelink transmission for review.  Gave instructions and advised I will call later this afternoon if it has not yet been received.  Patient verbalizes understanding and agreement with plan.  Received alert for 6 "AF" episodes on LINQ but no ECGs transmitted.  Will review when received.

## 2017-09-01 NOTE — Telephone Encounter (Signed)
Called pt to verify the appt that the hospital set-up w/Ashleigh for 09/05/17. Pt states she was not aware of the appt, and want to cancel for right now due to being too weak to come in the office. She states she was in the hosp for a month, and need to get her strength back up. She did state she will call to make appt once she feel better...Johny Chess

## 2017-09-01 NOTE — Telephone Encounter (Signed)
Patients daughter called and wants to know the physical therapy options for patient because she is going out of town.

## 2017-09-01 NOTE — Telephone Encounter (Signed)
I am not sure what the question is.  She would like to know options of places where she is going?  I do not know where she is going and likely will not know physical therapists in that area, but she may search for places close to where she will be and let us know.   Or is she looking to start later with therapies here?  Thanks.

## 2017-09-01 NOTE — Telephone Encounter (Signed)
Left message to call back to office.

## 2017-09-02 NOTE — Telephone Encounter (Signed)
Spoke with patient, advised that manual transmission has not been received.  Patient states that she is not staying at her house right now as she was recently d/c from the hospital after a month.  She will not have access to her monitor for the next couple of weeks while she recovers.  Advised patient to call us as soon as she is able to be near her monitor, and requested that she have someone bring it to her.  Patient verbalizes understanding and will plan to call us as soon as she is able to get her monitor.  She requests that I call her back and leave a VM with our phone number as she has nothing to write with.  She is appreciative of assistance and denies questions or concerns at this time.  Called back and LVM with the [facility] phone number.

## 2017-09-04 ENCOUNTER — Ambulatory Visit: Payer: Medicare HMO | Admitting: Occupational Therapy

## 2017-09-04 ENCOUNTER — Ambulatory Visit: Payer: Medicare HMO | Admitting: Physical Therapy

## 2017-09-04 ENCOUNTER — Encounter: Payer: Medicare HMO | Admitting: Speech Pathology

## 2017-09-04 DIAGNOSIS — R69 Illness, unspecified: Secondary | ICD-10-CM | POA: Diagnosis not present

## 2017-09-04 DIAGNOSIS — E785 Hyperlipidemia, unspecified: Secondary | ICD-10-CM | POA: Diagnosis not present

## 2017-09-04 DIAGNOSIS — I10 Essential (primary) hypertension: Secondary | ICD-10-CM | POA: Diagnosis not present

## 2017-09-04 DIAGNOSIS — D62 Acute posthemorrhagic anemia: Secondary | ICD-10-CM | POA: Diagnosis not present

## 2017-09-04 DIAGNOSIS — J939 Pneumothorax, unspecified: Secondary | ICD-10-CM | POA: Diagnosis not present

## 2017-09-04 DIAGNOSIS — Z483 Aftercare following surgery for neoplasm: Secondary | ICD-10-CM | POA: Diagnosis not present

## 2017-09-04 DIAGNOSIS — C3491 Malignant neoplasm of unspecified part of right bronchus or lung: Secondary | ICD-10-CM | POA: Diagnosis not present

## 2017-09-04 DIAGNOSIS — F17210 Nicotine dependence, cigarettes, uncomplicated: Secondary | ICD-10-CM | POA: Diagnosis not present

## 2017-09-04 DIAGNOSIS — K219 Gastro-esophageal reflux disease without esophagitis: Secondary | ICD-10-CM | POA: Diagnosis not present

## 2017-09-04 DIAGNOSIS — I480 Paroxysmal atrial fibrillation: Secondary | ICD-10-CM | POA: Diagnosis not present

## 2017-09-05 ENCOUNTER — Inpatient Hospital Stay: Payer: Medicare HMO | Admitting: Nurse Practitioner

## 2017-09-08 ENCOUNTER — Other Ambulatory Visit: Payer: Self-pay | Admitting: Thoracic Surgery (Cardiothoracic Vascular Surgery)

## 2017-09-08 DIAGNOSIS — Z902 Acquired absence of lung [part of]: Secondary | ICD-10-CM

## 2017-09-09 ENCOUNTER — Encounter: Payer: Self-pay | Admitting: Thoracic Surgery (Cardiothoracic Vascular Surgery)

## 2017-09-09 ENCOUNTER — Ambulatory Visit
Admission: RE | Admit: 2017-09-09 | Discharge: 2017-09-09 | Disposition: A | Discharge status: Home / Self Care | Payer: Medicare HMO | Source: Ambulatory Visit | Attending: Thoracic Surgery (Cardiothoracic Vascular Surgery) | Admitting: Thoracic Surgery (Cardiothoracic Vascular Surgery)

## 2017-09-09 ENCOUNTER — Ambulatory Visit (INDEPENDENT_AMBULATORY_CARE_PROVIDER_SITE_OTHER): Payer: Self-pay | Admitting: Thoracic Surgery (Cardiothoracic Vascular Surgery)

## 2017-09-09 VITALS — BP 136/86 | HR 103 | Ht 62.75 in | Wt 176.0 lb

## 2017-09-09 DIAGNOSIS — K219 Gastro-esophageal reflux disease without esophagitis: Secondary | ICD-10-CM | POA: Diagnosis not present

## 2017-09-09 DIAGNOSIS — I480 Paroxysmal atrial fibrillation: Secondary | ICD-10-CM | POA: Diagnosis not present

## 2017-09-09 DIAGNOSIS — Z902 Acquired absence of lung [part of]: Secondary | ICD-10-CM

## 2017-09-09 DIAGNOSIS — C3491 Malignant neoplasm of unspecified part of right bronchus or lung: Secondary | ICD-10-CM

## 2017-09-09 DIAGNOSIS — E785 Hyperlipidemia, unspecified: Secondary | ICD-10-CM | POA: Diagnosis not present

## 2017-09-09 DIAGNOSIS — I10 Essential (primary) hypertension: Secondary | ICD-10-CM | POA: Diagnosis not present

## 2017-09-09 DIAGNOSIS — Z483 Aftercare following surgery for neoplasm: Secondary | ICD-10-CM | POA: Diagnosis not present

## 2017-09-09 DIAGNOSIS — J939 Pneumothorax, unspecified: Secondary | ICD-10-CM | POA: Diagnosis not present

## 2017-09-09 DIAGNOSIS — R69 Illness, unspecified: Secondary | ICD-10-CM | POA: Diagnosis not present

## 2017-09-09 DIAGNOSIS — D62 Acute posthemorrhagic anemia: Secondary | ICD-10-CM | POA: Diagnosis not present

## 2017-09-09 DIAGNOSIS — R918 Other nonspecific abnormal finding of lung field: Secondary | ICD-10-CM | POA: Diagnosis not present

## 2017-09-09 NOTE — Progress Notes (Signed)
[address]       [place] [address]             [phone]       HPI: Kathleen Guerrero returns for a scheduled postoperative follow-up visit  [name] is a 71 year old woman with a history of tobacco abuse who underwent a thoracoscopic right upper lobectomy and lymph node dissection on 07/30/2017 for a stage IA adenocarcinoma.  Her postoperative course was complicated by severe delirium which occurred the night of surgery.  That persisted for quite some time.  She developed acute respiratory failure and had to be reintubated.  She had to have additional chest tubes placed.  She was treated with antibiotics for presumptive pneumonia.  She was finally extubated after about 2 weeks.  She made steady progress afterwards and went to rehab on 08/20/2017.   She did well in rehab and now is at home with home PT.  She feels well.  She feels like she is getting stronger every day.  She is not having any shortness of breath.  She asked about driving, but feels that she is not ready to drive just yet.  She is not having any significant incisional pain.  Past Medical History:  Diagnosis Date  . Arthritis   . GERD (gastroesophageal reflux disease)    tums  . Headache   . Hepatitis    cmv 30 yrs ago hepatitis  . History of hiatal hernia   . History of loop recorder    after stroke  . Hypertension   . Stroke ([place]) 07/2016   balance a little off  . Tobacco use      Current Outpatient Medications  Medication Sig Dispense Refill  . acetaminophen (TYLENOL) 325 MG tablet Take 650 mg by mouth daily as needed for mild pain or headache.    Marland Kitchen amiodarone (PACERONE) 200 MG tablet Take 1 tablet (200 mg total) by mouth daily. 30 tablet 0  . amLODipine (NORVASC) 10 MG tablet Take 1 tablet (10 mg total) by mouth daily. 90 tablet 0  . aspirin 325 MG tablet TAKE 1 TABLET BY MOUTH EVERY DAY 30 tablet 11  . atorvastatin (LIPITOR) 40 MG tablet TAKE 1 TABLET BY MOUTH EVERY DAY AT 6 p.m. 90  tablet 0  . calcium carbonate (TUMS - DOSED IN MG ELEMENTAL CALCIUM) 500 MG chewable tablet Chew 1 tablet by mouth 4 (four) times daily as needed for indigestion or heartburn.    . citalopram (CELEXA) 10 MG tablet Take 1 tablet (10 mg total) by mouth daily. 30 tablet 1  . cloNIDine (CATAPRES) 0.1 MG tablet Take 1 tablet (0.1 mg total) by mouth 2 (two) times daily. 60 tablet 11  . metoprolol tartrate (LOPRESSOR) 25 MG tablet Take 0.5 tablets (12.5 mg total) by mouth 2 (two) times daily. 60 tablet 0  . pantoprazole (PROTONIX) 40 MG tablet Take 1 tablet (40 mg total) by mouth every 12 (twelve) hours. 60 tablet 0  . phosphorus (K PHOS NEUTRAL) 155-852-130 MG tablet Take 2 tablets (500 mg total) by mouth 4 (four) times daily -  before meals and at bedtime. 120 tablet 0   No current facility-administered medications for this visit.     Physical Exam BP 136/86 (BP Location: Left Arm, Patient Position: Sitting, Cuff Size: Normal)   Pulse (!) 103   Ht 5' 2.75" (1.594 m)   Wt 176 lb (79.8 kg)   SpO2 97%   BMI 31.43 kg/m  71 year old woman in no acute distress Well-appearing Alert and oriented x3 with no focal deficits Lungs slightly diminished right base, otherwise clear Cardiac regular rate and rhythm Incisions well-healed with minimal eschar chest tube sites  Diagnostic Tests: CHEST  2 VIEW  COMPARISON:  Chest x-ray of 08/19/2017  FINDINGS: No active infiltrate or effusion is seen. There is some volume loss on the right and surgical sutures and clips overlie the right hilum. The heart is within normal limits in size. No acute bony abnormality is seen. Loop recorder again is noted implanted in the soft tissues of the anterior left chest.  IMPRESSION: No active lung disease.   Electronically Signed   By: Ivar Drape M.D.   On: 09/09/2017 11:10 Personally reviewed the chest x-ray images and concur with the findings noted above.  Impression: Kathleen Guerrero is a 71 year old woman  who underwent a thoracoscopic right upper lobectomy for stage Ia adenocarcinoma about 6 weeks ago.  Postoperative course was complicated by delirium and respiratory failure.  She also had acute kidney injury.  All of those issues resolved prior to discharge.  She did go to rehab due to severe deconditioning.  She now has been discharged from rehab and is at home.  She is still working with home physical therapy.  She is walking with a cane.  She has minimal discomfort and no shortness of breath.  I think she should wait a few more weeks before trying to drive just until she gets stronger.  Other than that her activities are unrestricted.  I will refer her to our multidisciplinary thoracic oncology clinic to meet with oncology.  She will not require any adjuvant therapy, but will need long-term follow-up.  Plan: Referral to [facility]  Return in 1 month with PA and lateral chest x-ray to check on progress  [name], MD [facility] [phone]

## 2017-09-10 ENCOUNTER — Telehealth: Payer: Self-pay | Admitting: *Deleted

## 2017-09-10 DIAGNOSIS — R911 Solitary pulmonary nodule: Secondary | ICD-10-CM

## 2017-09-10 NOTE — Telephone Encounter (Signed)
Oncology Nurse Navigator Documentation  Oncology Nurse Navigator Flowsheets 09/10/2017  Navigator Location [facility]  Referral date to RadOnc/MedOnc 09/09/2017  Navigator Encounter Type Telephone/I received referral on Kathleen Guerrero. I called and scheduled her to be seen at clinic.  I did give patient an appt date but she was not able to make it. I gave her another date and she was ok with appt of 10/16/17  Telephone Outgoing Call  Treatment Phase Other  Barriers/Navigation Needs Coordination of Care;Education  Education Other  Interventions Education;Coordination of Care  Coordination of Care Appts  Education Method Verbal  Acuity Level 1  Acuity Level 1 Initial guidance, education and coordination as needed  Time Spent with Patient 15

## 2017-09-11 ENCOUNTER — Encounter: Payer: Medicare HMO | Admitting: Physical Medicine & Rehabilitation

## 2017-09-11 DIAGNOSIS — K219 Gastro-esophageal reflux disease without esophagitis: Secondary | ICD-10-CM | POA: Diagnosis not present

## 2017-09-11 DIAGNOSIS — C3491 Malignant neoplasm of unspecified part of right bronchus or lung: Secondary | ICD-10-CM | POA: Diagnosis not present

## 2017-09-11 DIAGNOSIS — J939 Pneumothorax, unspecified: Secondary | ICD-10-CM | POA: Diagnosis not present

## 2017-09-11 DIAGNOSIS — E785 Hyperlipidemia, unspecified: Secondary | ICD-10-CM | POA: Diagnosis not present

## 2017-09-11 DIAGNOSIS — R69 Illness, unspecified: Secondary | ICD-10-CM | POA: Diagnosis not present

## 2017-09-11 DIAGNOSIS — I10 Essential (primary) hypertension: Secondary | ICD-10-CM | POA: Diagnosis not present

## 2017-09-11 DIAGNOSIS — Z483 Aftercare following surgery for neoplasm: Secondary | ICD-10-CM | POA: Diagnosis not present

## 2017-09-11 DIAGNOSIS — I480 Paroxysmal atrial fibrillation: Secondary | ICD-10-CM | POA: Diagnosis not present

## 2017-09-11 DIAGNOSIS — D62 Acute posthemorrhagic anemia: Secondary | ICD-10-CM | POA: Diagnosis not present

## 2017-09-15 ENCOUNTER — Telehealth: Payer: Self-pay | Admitting: *Deleted

## 2017-09-15 NOTE — Telephone Encounter (Signed)
Kathleen Guerrero has called this morning with c/o of a deep cough during the day only with clear phlegm. She was just seen by Dr. Roxan Hockey last week and her chest xray was clear. She is afebrile. I suggested starting  Mucinex plain since she is on bp meds. She agreed. She will seek additional care if this does not improve.

## 2017-09-16 ENCOUNTER — Ambulatory Visit (INDEPENDENT_AMBULATORY_CARE_PROVIDER_SITE_OTHER): Payer: Medicare HMO | Admitting: *Deleted

## 2017-09-16 DIAGNOSIS — I639 Cerebral infarction, unspecified: Secondary | ICD-10-CM | POA: Diagnosis not present

## 2017-09-17 ENCOUNTER — Telehealth: Payer: Self-pay | Admitting: Cardiology

## 2017-09-17 DIAGNOSIS — R69 Illness, unspecified: Secondary | ICD-10-CM | POA: Diagnosis not present

## 2017-09-17 DIAGNOSIS — D62 Acute posthemorrhagic anemia: Secondary | ICD-10-CM | POA: Diagnosis not present

## 2017-09-17 DIAGNOSIS — Z483 Aftercare following surgery for neoplasm: Secondary | ICD-10-CM | POA: Diagnosis not present

## 2017-09-17 DIAGNOSIS — I480 Paroxysmal atrial fibrillation: Secondary | ICD-10-CM | POA: Diagnosis not present

## 2017-09-17 DIAGNOSIS — E785 Hyperlipidemia, unspecified: Secondary | ICD-10-CM | POA: Diagnosis not present

## 2017-09-17 DIAGNOSIS — I10 Essential (primary) hypertension: Secondary | ICD-10-CM | POA: Diagnosis not present

## 2017-09-17 DIAGNOSIS — C3491 Malignant neoplasm of unspecified part of right bronchus or lung: Secondary | ICD-10-CM | POA: Diagnosis not present

## 2017-09-17 DIAGNOSIS — K219 Gastro-esophageal reflux disease without esophagitis: Secondary | ICD-10-CM | POA: Diagnosis not present

## 2017-09-17 DIAGNOSIS — J939 Pneumothorax, unspecified: Secondary | ICD-10-CM | POA: Diagnosis not present

## 2017-09-17 NOTE — Progress Notes (Signed)
[facility] Summary Report / Loop Recorder 

## 2017-09-17 NOTE — Telephone Encounter (Signed)
Spoke w/ pt and requested that she send a manual transmission b/c her home monitor has not updated in at least 14 days.   

## 2017-09-19 ENCOUNTER — Telehealth: Payer: Self-pay | Admitting: Family

## 2017-09-19 DIAGNOSIS — R69 Illness, unspecified: Secondary | ICD-10-CM | POA: Diagnosis not present

## 2017-09-19 DIAGNOSIS — Z483 Aftercare following surgery for neoplasm: Secondary | ICD-10-CM | POA: Diagnosis not present

## 2017-09-19 DIAGNOSIS — I10 Essential (primary) hypertension: Secondary | ICD-10-CM | POA: Diagnosis not present

## 2017-09-19 DIAGNOSIS — C3491 Malignant neoplasm of unspecified part of right bronchus or lung: Secondary | ICD-10-CM | POA: Diagnosis not present

## 2017-09-19 DIAGNOSIS — I480 Paroxysmal atrial fibrillation: Secondary | ICD-10-CM | POA: Diagnosis not present

## 2017-09-19 DIAGNOSIS — K219 Gastro-esophageal reflux disease without esophagitis: Secondary | ICD-10-CM | POA: Diagnosis not present

## 2017-09-19 DIAGNOSIS — J939 Pneumothorax, unspecified: Secondary | ICD-10-CM | POA: Diagnosis not present

## 2017-09-19 DIAGNOSIS — E785 Hyperlipidemia, unspecified: Secondary | ICD-10-CM | POA: Diagnosis not present

## 2017-09-19 DIAGNOSIS — D62 Acute posthemorrhagic anemia: Secondary | ICD-10-CM | POA: Diagnosis not present

## 2017-09-19 NOTE — Telephone Encounter (Signed)
Ok for verbal 

## 2017-09-19 NOTE — Telephone Encounter (Signed)
Jeanenne from [facility] called requesting verbal order to change PT orders to two times a week for an additional 2 weeks because the pt has not met her goals.  ° °

## 2017-09-22 DIAGNOSIS — D62 Acute posthemorrhagic anemia: Secondary | ICD-10-CM | POA: Diagnosis not present

## 2017-09-22 DIAGNOSIS — C3491 Malignant neoplasm of unspecified part of right bronchus or lung: Secondary | ICD-10-CM | POA: Diagnosis not present

## 2017-09-22 DIAGNOSIS — J939 Pneumothorax, unspecified: Secondary | ICD-10-CM | POA: Diagnosis not present

## 2017-09-22 DIAGNOSIS — I480 Paroxysmal atrial fibrillation: Secondary | ICD-10-CM | POA: Diagnosis not present

## 2017-09-22 DIAGNOSIS — I10 Essential (primary) hypertension: Secondary | ICD-10-CM | POA: Diagnosis not present

## 2017-09-22 DIAGNOSIS — R69 Illness, unspecified: Secondary | ICD-10-CM | POA: Diagnosis not present

## 2017-09-22 DIAGNOSIS — E785 Hyperlipidemia, unspecified: Secondary | ICD-10-CM | POA: Diagnosis not present

## 2017-09-22 DIAGNOSIS — Z483 Aftercare following surgery for neoplasm: Secondary | ICD-10-CM | POA: Diagnosis not present

## 2017-09-22 DIAGNOSIS — K219 Gastro-esophageal reflux disease without esophagitis: Secondary | ICD-10-CM | POA: Diagnosis not present

## 2017-09-22 NOTE — Telephone Encounter (Signed)
Notified Jeanene w/MD response...Johny Chess

## 2017-09-23 ENCOUNTER — Other Ambulatory Visit (INDEPENDENT_AMBULATORY_CARE_PROVIDER_SITE_OTHER): Payer: Medicare HMO

## 2017-09-23 ENCOUNTER — Encounter: Payer: Self-pay | Admitting: Internal Medicine

## 2017-09-23 ENCOUNTER — Telehealth: Payer: Self-pay | Admitting: Family

## 2017-09-23 ENCOUNTER — Ambulatory Visit (INDEPENDENT_AMBULATORY_CARE_PROVIDER_SITE_OTHER): Payer: Medicare HMO | Admitting: Internal Medicine

## 2017-09-23 VITALS — BP 126/84 | HR 102 | Temp 98.1°F | Ht 62.75 in | Wt 166.0 lb

## 2017-09-23 DIAGNOSIS — I10 Essential (primary) hypertension: Secondary | ICD-10-CM | POA: Diagnosis not present

## 2017-09-23 DIAGNOSIS — F32A Depression, unspecified: Secondary | ICD-10-CM

## 2017-09-23 DIAGNOSIS — G7281 Critical illness myopathy: Secondary | ICD-10-CM

## 2017-09-23 DIAGNOSIS — F419 Anxiety disorder, unspecified: Secondary | ICD-10-CM | POA: Diagnosis not present

## 2017-09-23 DIAGNOSIS — R5383 Other fatigue: Secondary | ICD-10-CM | POA: Diagnosis not present

## 2017-09-23 DIAGNOSIS — F329 Major depressive disorder, single episode, unspecified: Secondary | ICD-10-CM | POA: Diagnosis not present

## 2017-09-23 DIAGNOSIS — R69 Illness, unspecified: Secondary | ICD-10-CM | POA: Diagnosis not present

## 2017-09-23 LAB — CBC WITH DIFFERENTIAL/PLATELET
BASOS PCT: 0.9 % (ref 0.0–3.0)
Basophils Absolute: 0.1 10*3/uL (ref 0.0–0.1)
EOS PCT: 0.2 % (ref 0.0–5.0)
Eosinophils Absolute: 0 10*3/uL (ref 0.0–0.7)
HCT: 35.2 % — ABNORMAL LOW (ref 36.0–46.0)
HEMOGLOBIN: 11.7 g/dL — AB (ref 12.0–15.0)
LYMPHS ABS: 2.1 10*3/uL (ref 0.7–4.0)
Lymphocytes Relative: 22.7 % (ref 12.0–46.0)
MCHC: 33.2 g/dL (ref 30.0–36.0)
MCV: 87.8 fl (ref 78.0–100.0)
MONO ABS: 1 10*3/uL (ref 0.1–1.0)
MONOS PCT: 10.8 % (ref 3.0–12.0)
Neutro Abs: 5.9 10*3/uL (ref 1.4–7.7)
Neutrophils Relative %: 65.4 % (ref 43.0–77.0)
Platelets: 315 10*3/uL (ref 150.0–400.0)
RBC: 4.01 Mil/uL (ref 3.87–5.11)
RDW: 16.7 % — AB (ref 11.5–15.5)
WBC: 9.1 10*3/uL (ref 4.0–10.5)

## 2017-09-23 MED ORDER — ESCITALOPRAM OXALATE 10 MG PO TABS: 10.0000 mg | ORAL_TABLET | Freq: Every day | ORAL | 3 refills | Status: DC

## 2017-09-23 NOTE — Progress Notes (Signed)
Subjective:    Patient ID: Kathleen Guerrero, female    DOB: 1946/03/07, 71 y.o.   MRN: [id number]  HPI  Here to f/u with friend for support, with c/o fatigue, exhaustion, lack of stamina and daytime sleepiness after recent severe illness and hospn sept 26-oct 17, involving debility due to respiratory failure with VATS and RUL wedge resection + for adenocarcinoma (node negative) and noted critical illness myopathy.  Has oncology f/u first wk of dec 2018.  Course was also complicated byt depression, anemia, PAF, HTN, hx of CVA with loop recorder in place since sept 2017, and PCM requiring tube feeds.  Pt underwent rehab stay from oct 17-25.  Now walks with cane without recent fall.  Had f/u with Dr Roxan Hockey with negative CXR nov 6.  Documented wts from that date are c/w further 10 lb wt loss.   Wt Readings from Last 3 Encounters:  09/23/17 166 lb (75.3 kg)  09/09/17 176 lb (79.8 kg)  08/28/17 175 lb 11.3 oz (79.7 kg)  Has HH in place with PT, and friend states she had a particularly good day yesterday with mobility., not very good today.  Pt denies chest pain, increased sob or doe, wheezing, orthopnea, PND, increased LE swelling, palpitations, dizziness or syncope.  No fever, ST, cough, dysuria, diarrhea or other acute symptoms.  Pt does relate has been worsening depression without SI or HI, asks for change to lexapro Past Medical History:  Diagnosis Date  . Arthritis   . GERD (gastroesophageal reflux disease)    tums  . Headache   . Hepatitis    cmv 30 yrs ago hepatitis  . History of hiatal hernia   . History of loop recorder    after stroke  . Hypertension   . Stroke ([place]) 07/2016   balance a little off  . Tobacco use    Past Surgical History:  Procedure Laterality Date  . APPENDECTOMY    . DERMOID CYST  EXCISION     x2 38'  . EP IMPLANTABLE DEVICE N/A 07/23/2016   Procedure: Loop Recorder Insertion;  Surgeon: Thompson Grayer, MD;  Location: [facility];  Service:  Cardiovascular;  Laterality: N/A;  . Intestinal obstruction  1979  . LOBECTOMY Right 07/30/2017   Procedure: LOBECTOMY;  Surgeon: [name], MD;  Location: [facility];  Service: Thoracic;  Laterality: Right;  . TEE WITHOUT CARDIOVERSION N/A 07/23/2016   Procedure: TRANSESOPHAGEAL ECHOCARDIOGRAM (TEE);  Surgeon: Lelon Perla, MD;  Location: [facility] ENDOSCOPY;  Service: Cardiovascular;  Laterality: N/A;  . VIDEO ASSISTED THORACOSCOPY (VATS)/WEDGE RESECTION Right 07/30/2017   Procedure: VIDEO ASSISTED THORACOSCOPY (VATS)/WEDGE RESECTION;  Surgeon: [name], MD;  Location: [facility];  Service: Thoracic;  Laterality: Right;    reports that she quit smoking about 14 months ago. Her smoking use included cigarettes. She has a 50.00 pack-year smoking history. she has never used smokeless tobacco. She reports that she drinks alcohol. She reports that she does not use drugs. family history includes Cancer in her maternal grandfather; Kidney disease in her maternal grandmother. No Known Allergies Current Outpatient Medications on File Prior to Visit  Medication Sig Dispense Refill  . acetaminophen (TYLENOL) 325 MG tablet Take 650 mg by mouth daily as needed for mild pain or headache.    Marland Kitchen amiodarone (PACERONE) 200 MG tablet Take 1 tablet (200 mg total) by mouth daily. 30 tablet 0  . amLODipine (NORVASC) 10 MG tablet Take 1 tablet (10 mg total) by mouth daily.  90 tablet 0  . aspirin 325 MG tablet TAKE 1 TABLET BY MOUTH EVERY DAY 30 tablet 11  . atorvastatin (LIPITOR) 40 MG tablet TAKE 1 TABLET BY MOUTH EVERY DAY AT 6 p.m. 90 tablet 0  . calcium carbonate (TUMS - DOSED IN MG ELEMENTAL CALCIUM) 500 MG chewable tablet Chew 1 tablet by mouth 4 (four) times daily as needed for indigestion or heartburn.    . cloNIDine (CATAPRES) 0.1 MG tablet Take 1 tablet (0.1 mg total) by mouth 2 (two) times daily. 60 tablet 11  . metoprolol tartrate (LOPRESSOR) 25 MG tablet Take 0.5 tablets (12.5 mg total) by mouth  2 (two) times daily. 60 tablet 0  . pantoprazole (PROTONIX) 40 MG tablet Take 1 tablet (40 mg total) by mouth every 12 (twelve) hours. 60 tablet 0  . phosphorus (K PHOS NEUTRAL) 155-852-130 MG tablet Take 2 tablets (500 mg total) by mouth 4 (four) times daily -  before meals and at bedtime. 120 tablet 0   No current facility-administered medications on file prior to visit.    Review of Systems  Constitutional: Negative for other unusual diaphoresis or sweats HENT: Negative for ear discharge or swelling Eyes: Negative for other worsening visual disturbances Respiratory: Negative for stridor or other swelling  Gastrointestinal: Negative for worsening distension or other blood Genitourinary: Negative for retention or other urinary change Musculoskeletal: Negative for other MSK pain or swelling Skin: Negative for color change or other new lesions Neurological: Negative for worsening tremors and other numbness  Psychiatric/Behavioral: Negative for worsening agitation or other fatigue All other system neg per pt    Objective:   Physical Exam BP 126/84   Pulse (!) 102   Temp 98.1 F (36.7 C) (Oral)   Ht 5' 2.75" (1.594 m)   Wt 166 lb (75.3 kg)   SpO2 96%   BMI 29.64 kg/m  VS noted,  Constitutional: Pt appears in NAD HENT: Head: NCAT.  Right Ear: External ear normal.  Left Ear: External ear normal.  Eyes: . Pupils are equal, round, and reactive to light. Conjunctivae and EOM are normal Nose: without d/c or deformity Neck: Neck supple. Gross normal ROM Cardiovascular: Normal rate and regular rhythm.   Pulmonary/Chest: Effort normal and breath sounds decreased without rales or wheezing.  Abd:  Soft, NT, ND, + BS, no organomegaly Neurological: Pt is alert. At baseline orientation, motor grossly intact Skin: Skin is warm. No rashes, other new lesions, no LE edema Psychiatric: Pt behavior is normal without agitation , + depressed affect No other exam findings    Assessment & Plan:

## 2017-09-23 NOTE — Assessment & Plan Note (Signed)
For change celexa to lexapro 10 qd as above

## 2017-09-23 NOTE — Assessment & Plan Note (Signed)
To cont PT

## 2017-09-23 NOTE — Assessment & Plan Note (Signed)
stable overall by history and exam, recent data reviewed with pt, and pt to continue medical treatment as before,  to f/u any worsening symptoms or concerns  

## 2017-09-23 NOTE — Patient Instructions (Signed)
Ok to change the citalopram to lexapro to 10 mg per day  Please continue all other medications as before, and refills have been done if requested.  Please have the pharmacy call with any other refills you may need.  Please keep your appointments with your specialists as you may have planned  Please go to the LAB in the Basement (turn left off the elevator) for the tests to be done today  You will be contacted by phone if any changes need to be made immediately.  Otherwise, you will receive a letter about your results with an explanation, but please check with MyChart first.  Please remember to sign up for MyChart if you have not done so, as this will be important to you in the future with finding out test results, communicating by private email, and scheduling acute appointments online when needed.  Please return in 3 months, or sooner if needed

## 2017-09-23 NOTE — Telephone Encounter (Signed)
PLEASE NOTE: All timestamps contained within this report are represented as [place] Standard Time. CONFIDENTIALTY NOTICE: This fax transmission is intended only for the addressee. It contains information that is legally privileged, confidential or otherwise protected from use or disclosure. If you are not the intended recipient, you are strictly prohibited from reviewing, disclosing, copying using or disseminating any of this information or taking any action in reliance on or regarding this information. If you have received this fax in error, please notify us immediately by telephone so that we can arrange for its return to [place]. Phone: [phone], Toll-Free: [phone], Fax: [phone] Page: 1 of 1 Call Id: [id number] [facility] - Client [place] Patient Name: Kathleen Guerrero DOB: 18-Oct-1946 Initial Comment Caller states c/o weakness and exhaustion. Nurse Assessment Nurse: Juleen China RN, Butch Penny Date/Time Eilene Ghazi Time): 09/23/2017 12:54:29 PM Confirm and document reason for call. If symptomatic, describe symptoms. ---Caller states c/o weakness and exhaustion 2-3 weeks. She had surgery end of September and does not feel better. Taking Celexa and is concerned may be side effect. No fever. Hx of CVA October 2017. Surgery site is clean and dry. Does the patient have any new or worsening symptoms? ---Yes Will a triage be completed? ---Yes Related visit to physician within the last 2 weeks? ---Yes Does the PT have any chronic conditions? (i.e. diabetes, asthma, etc.) ---Yes List chronic conditions. ---HTN CVA Is this a behavioral health or substance abuse call? ---No Guidelines Guideline Title Affirmed Question Affirmed Notes Weakness (Generalized) and Fatigue Pale skin (pallor) Final Disposition User See Physician within 4 Hours (or PCP triage) Juleen China, RN, Butch Penny Referrals REFERRED TO PCP OFFICE Caller Disagree/Comply  Comply Caller Understands Yes PreDisposition Call Doctor

## 2017-09-23 NOTE — Assessment & Plan Note (Addendum)
Pt with persistent fatigue and lack of stamina and "just not bouncing back" even 1 month after rehab d/c, still getting PT at home, walks with cane without falls; etiology unclear but I suspect related to recent severe illness and possibly recent worsening depression, in the setting of PCM and futher wt loss not otherwise explained by malignancy.  Pt for labs as ordered today, cont PT, f/u oncology as planned, and change lower dose celexa to lexapro 10 qd.  Declines referral for counseling at this time.     Note:  Total time for pt hx, exam, review of record with pt in the room, determination of diagnoses and plan for further eval and tx is > 40 min, with over 50% spent in coordination and counseling of patient, including the differential dx, tx, further evaluation and other management of fatigue, HTN, anxiety and depression

## 2017-09-24 ENCOUNTER — Emergency Department (HOSPITAL_COMMUNITY): Payer: Medicare HMO

## 2017-09-24 ENCOUNTER — Encounter (HOSPITAL_COMMUNITY): Payer: Self-pay

## 2017-09-24 ENCOUNTER — Telehealth: Payer: Self-pay

## 2017-09-24 ENCOUNTER — Other Ambulatory Visit: Payer: Self-pay

## 2017-09-24 ENCOUNTER — Other Ambulatory Visit (INDEPENDENT_AMBULATORY_CARE_PROVIDER_SITE_OTHER): Payer: Medicare HMO

## 2017-09-24 ENCOUNTER — Encounter: Payer: Self-pay | Admitting: Cardiology

## 2017-09-24 ENCOUNTER — Observation Stay (HOSPITAL_COMMUNITY)
Admission: EM | Admit: 2017-09-24 | Discharge: 2017-09-26 | Disposition: A | Discharge status: Home / Self Care | Payer: Medicare HMO | Attending: Internal Medicine | Admitting: Internal Medicine

## 2017-09-24 DIAGNOSIS — Z809 Family history of malignant neoplasm, unspecified: Secondary | ICD-10-CM | POA: Diagnosis not present

## 2017-09-24 DIAGNOSIS — I10 Essential (primary) hypertension: Secondary | ICD-10-CM | POA: Diagnosis not present

## 2017-09-24 DIAGNOSIS — Z79899 Other long term (current) drug therapy: Secondary | ICD-10-CM | POA: Diagnosis not present

## 2017-09-24 DIAGNOSIS — M1990 Unspecified osteoarthritis, unspecified site: Secondary | ICD-10-CM | POA: Diagnosis not present

## 2017-09-24 DIAGNOSIS — R531 Weakness: Secondary | ICD-10-CM

## 2017-09-24 DIAGNOSIS — F419 Anxiety disorder, unspecified: Secondary | ICD-10-CM | POA: Diagnosis not present

## 2017-09-24 DIAGNOSIS — Z7982 Long term (current) use of aspirin: Secondary | ICD-10-CM | POA: Diagnosis not present

## 2017-09-24 DIAGNOSIS — K219 Gastro-esophageal reflux disease without esophagitis: Secondary | ICD-10-CM | POA: Insufficient documentation

## 2017-09-24 DIAGNOSIS — R69 Illness, unspecified: Secondary | ICD-10-CM | POA: Diagnosis not present

## 2017-09-24 DIAGNOSIS — R5383 Other fatigue: Secondary | ICD-10-CM | POA: Insufficient documentation

## 2017-09-24 DIAGNOSIS — F329 Major depressive disorder, single episode, unspecified: Secondary | ICD-10-CM | POA: Diagnosis not present

## 2017-09-24 DIAGNOSIS — Z8673 Personal history of transient ischemic attack (TIA), and cerebral infarction without residual deficits: Secondary | ICD-10-CM | POA: Diagnosis not present

## 2017-09-24 DIAGNOSIS — G729 Myopathy, unspecified: Secondary | ICD-10-CM | POA: Insufficient documentation

## 2017-09-24 DIAGNOSIS — G629 Polyneuropathy, unspecified: Secondary | ICD-10-CM | POA: Diagnosis not present

## 2017-09-24 DIAGNOSIS — E876 Hypokalemia: Secondary | ICD-10-CM | POA: Insufficient documentation

## 2017-09-24 DIAGNOSIS — I480 Paroxysmal atrial fibrillation: Secondary | ICD-10-CM | POA: Insufficient documentation

## 2017-09-24 DIAGNOSIS — Z87891 Personal history of nicotine dependence: Secondary | ICD-10-CM | POA: Insufficient documentation

## 2017-09-24 DIAGNOSIS — K449 Diaphragmatic hernia without obstruction or gangrene: Secondary | ICD-10-CM | POA: Insufficient documentation

## 2017-09-24 DIAGNOSIS — E860 Dehydration: Secondary | ICD-10-CM | POA: Diagnosis not present

## 2017-09-24 DIAGNOSIS — E785 Hyperlipidemia, unspecified: Secondary | ICD-10-CM | POA: Diagnosis not present

## 2017-09-24 DIAGNOSIS — G7281 Critical illness myopathy: Secondary | ICD-10-CM | POA: Diagnosis not present

## 2017-09-24 DIAGNOSIS — Z902 Acquired absence of lung [part of]: Secondary | ICD-10-CM | POA: Insufficient documentation

## 2017-09-24 DIAGNOSIS — N179 Acute kidney failure, unspecified: Principal | ICD-10-CM | POA: Diagnosis present

## 2017-09-24 DIAGNOSIS — Z841 Family history of disorders of kidney and ureter: Secondary | ICD-10-CM | POA: Insufficient documentation

## 2017-09-24 LAB — COMPREHENSIVE METABOLIC PANEL
ALBUMIN: 3.3 g/dL — AB (ref 3.5–5.0)
ALK PHOS: 66 U/L (ref 38–126)
ALT: 13 U/L — AB (ref 14–54)
ANION GAP: 10 (ref 5–15)
AST: 16 U/L (ref 15–41)
BUN: 12 mg/dL (ref 6–20)
CALCIUM: 11.9 mg/dL — AB (ref 8.9–10.3)
CO2: 28 mmol/L (ref 22–32)
CREATININE: 1.44 mg/dL — AB (ref 0.44–1.00)
Chloride: 98 mmol/L — ABNORMAL LOW (ref 101–111)
GFR calc Af Amer: 42 mL/min — ABNORMAL LOW (ref >60)
GFR calc non Af Amer: 36 mL/min — ABNORMAL LOW (ref >60)
GLUCOSE: 122 mg/dL — AB (ref 65–99)
Potassium: 2.4 mmol/L — CL (ref 3.5–5.1)
SODIUM: 136 mmol/L (ref 135–145)
Total Bilirubin: 0.6 mg/dL (ref 0.3–1.2)
Total Protein: 6.9 g/dL (ref 6.5–8.1)

## 2017-09-24 LAB — HEPATIC FUNCTION PANEL
ALK PHOS: 63 U/L (ref 39–117)
ALT: 11 U/L (ref 0–35)
AST: 15 U/L (ref 0–37)
Albumin: 3.8 g/dL (ref 3.5–5.2)
BILIRUBIN TOTAL: 0.6 mg/dL (ref 0.2–1.2)
Bilirubin, Direct: 0.1 mg/dL (ref 0.0–0.3)
Total Protein: 7.6 g/dL (ref 6.0–8.3)

## 2017-09-24 LAB — CBC WITH DIFFERENTIAL/PLATELET
BASOS PCT: 0 %
Basophils Absolute: 0 10*3/uL (ref 0.0–0.1)
Eosinophils Absolute: 0 10*3/uL (ref 0.0–0.7)
Eosinophils Relative: 0 %
HEMATOCRIT: 34 % — AB (ref 36.0–46.0)
Hemoglobin: 11.3 g/dL — ABNORMAL LOW (ref 12.0–15.0)
Lymphocytes Relative: 23 %
Lymphs Abs: 1.8 10*3/uL (ref 0.7–4.0)
MCH: 29.2 pg (ref 26.0–34.0)
MCHC: 33.2 g/dL (ref 30.0–36.0)
MCV: 87.9 fL (ref 78.0–100.0)
MONO ABS: 0.8 10*3/uL (ref 0.1–1.0)
MONOS PCT: 10 %
NEUTROS ABS: 5.2 10*3/uL (ref 1.7–7.7)
Neutrophils Relative %: 67 %
Platelets: 296 10*3/uL (ref 150–400)
RBC: 3.87 MIL/uL (ref 3.87–5.11)
RDW: 15.2 % (ref 11.5–15.5)
WBC: 7.9 10*3/uL (ref 4.0–10.5)

## 2017-09-24 LAB — URINALYSIS, ROUTINE W REFLEX MICROSCOPIC
BILIRUBIN URINE: NEGATIVE
BILIRUBIN URINE: NEGATIVE
Glucose, UA: NEGATIVE mg/dL
KETONES UR: NEGATIVE
KETONES UR: NEGATIVE mg/dL
NITRITE: POSITIVE — AB
Nitrite: POSITIVE — AB
PROTEIN: NEGATIVE mg/dL
Specific Gravity, Urine: <=1.005 — AB (ref 1.000–1.030)
Specific Gravity, Urine: 1.011 (ref 1.005–1.030)
TOTAL PROTEIN, URINE-UPE24: NEGATIVE
URINE GLUCOSE: NEGATIVE
Urobilinogen, UA: 0.2 (ref 0.0–1.0)
pH: 6.5 (ref 5.0–8.0)
pH: 6 (ref 5.0–8.0)

## 2017-09-24 LAB — BASIC METABOLIC PANEL
BUN: 13 mg/dL (ref 6–23)
CO2: 34 mEq/L — ABNORMAL HIGH (ref 19–32)
Calcium: 12.7 mg/dL — ABNORMAL HIGH (ref 8.4–10.5)
Chloride: 94 mEq/L — ABNORMAL LOW (ref 96–112)
Creatinine, Ser: 1.6 mg/dL — ABNORMAL HIGH (ref 0.40–1.20)
GFR: 33.92 mL/min — AB (ref >60.00)
Glucose, Bld: 120 mg/dL — ABNORMAL HIGH (ref 70–99)
POTASSIUM: 3 meq/L — AB (ref 3.5–5.1)
SODIUM: 137 meq/L (ref 135–145)

## 2017-09-24 LAB — VITAMIN D 25 HYDROXY (VIT D DEFICIENCY, FRACTURES): VITD: 12.33 ng/mL — ABNORMAL LOW (ref 30.00–100.00)

## 2017-09-24 LAB — VITAMIN B12: Vitamin B-12: 169 pg/mL — ABNORMAL LOW (ref 211–911)

## 2017-09-24 LAB — TSH: TSH: 1.91 u[IU]/mL (ref 0.35–4.50)

## 2017-09-24 LAB — I-STAT CG4 LACTIC ACID, ED: Lactic Acid, Venous: 1.26 mmol/L (ref 0.5–1.9)

## 2017-09-24 MED ORDER — POTASSIUM CHLORIDE 10 MEQ/100ML IV SOLN
10.0000 meq | INTRAVENOUS | Status: AC
  Administered 2017-09-24 (×4): 10 meq via INTRAVENOUS
  Filled 2017-09-24 (×4): qty 100

## 2017-09-24 MED ORDER — ONDANSETRON HCL 4 MG PO TABS: 4.0000 mg | ORAL_TABLET | Freq: Four times a day (QID) | ORAL | Status: DC | PRN

## 2017-09-24 MED ORDER — DEXTROSE-NACL 5-0.9 % IV SOLN
INTRAVENOUS | Status: DC
  Administered 2017-09-25: 05:00:00 via INTRAVENOUS

## 2017-09-24 MED ORDER — HEPARIN SODIUM (PORCINE) 5000 UNIT/ML IJ SOLN
5000.0000 [IU] | Freq: Three times a day (TID) | INTRAMUSCULAR | Status: DC
  Administered 2017-09-24 – 2017-09-26 (×5): 5000 [IU] via SUBCUTANEOUS
  Filled 2017-09-24 (×6): qty 1

## 2017-09-24 MED ORDER — ATORVASTATIN CALCIUM 40 MG PO TABS
40.0000 mg | ORAL_TABLET | Freq: Every day | ORAL | Status: DC
  Administered 2017-09-25: 40 mg via ORAL
  Filled 2017-09-24: qty 1

## 2017-09-24 MED ORDER — ASPIRIN 325 MG PO TABS
325.0000 mg | ORAL_TABLET | Freq: Every day | ORAL | Status: DC
  Administered 2017-09-25 – 2017-09-26 (×2): 325 mg via ORAL
  Filled 2017-09-24 (×2): qty 1

## 2017-09-24 MED ORDER — DEXTROSE 5 % IV SOLN
1.0000 g | Freq: Once | INTRAVENOUS | Status: AC
  Administered 2017-09-24: 1 g via INTRAVENOUS
  Filled 2017-09-24: qty 10

## 2017-09-24 MED ORDER — POTASSIUM CHLORIDE CRYS ER 20 MEQ PO TBCR
40.0000 meq | EXTENDED_RELEASE_TABLET | Freq: Once | ORAL | Status: AC
  Administered 2017-09-24: 40 meq via ORAL
  Filled 2017-09-24: qty 2

## 2017-09-24 MED ORDER — ESCITALOPRAM OXALATE 10 MG PO TABS
10.0000 mg | ORAL_TABLET | Freq: Every day | ORAL | Status: DC
  Administered 2017-09-25 – 2017-09-26 (×2): 10 mg via ORAL
  Filled 2017-09-24 (×2): qty 1

## 2017-09-24 MED ORDER — ACETAMINOPHEN 325 MG PO TABS: 650.0000 mg | ORAL_TABLET | Freq: Four times a day (QID) | ORAL | Status: DC | PRN

## 2017-09-24 MED ORDER — CALCIUM CARBONATE ANTACID 500 MG PO CHEW
1.0000 | CHEWABLE_TABLET | Freq: Four times a day (QID) | ORAL | Status: DC | PRN
  Administered 2017-09-24: 200 mg via ORAL
  Filled 2017-09-24: qty 1

## 2017-09-24 MED ORDER — CLONIDINE HCL 0.1 MG PO TABS
0.1000 mg | ORAL_TABLET | Freq: Two times a day (BID) | ORAL | Status: DC
  Administered 2017-09-24 – 2017-09-26 (×4): 0.1 mg via ORAL
  Filled 2017-09-24 (×4): qty 1

## 2017-09-24 MED ORDER — AMLODIPINE BESYLATE 10 MG PO TABS
10.0000 mg | ORAL_TABLET | Freq: Every day | ORAL | Status: DC
  Administered 2017-09-25 – 2017-09-26 (×2): 10 mg via ORAL
  Filled 2017-09-24 (×2): qty 1

## 2017-09-24 MED ORDER — SODIUM CHLORIDE 0.9 % IV BOLUS (SEPSIS)
500.0000 mL | Freq: Once | INTRAVENOUS | Status: AC
  Administered 2017-09-24: 500 mL via INTRAVENOUS

## 2017-09-24 MED ORDER — AMIODARONE HCL 200 MG PO TABS
200.0000 mg | ORAL_TABLET | Freq: Every day | ORAL | Status: DC
  Administered 2017-09-25 – 2017-09-26 (×2): 200 mg via ORAL
  Filled 2017-09-24 (×2): qty 1

## 2017-09-24 MED ORDER — ACETAMINOPHEN 650 MG RE SUPP: 650.0000 mg | Freq: Four times a day (QID) | RECTAL | Status: DC | PRN

## 2017-09-24 MED ORDER — METOPROLOL TARTRATE 12.5 MG HALF TABLET
12.5000 mg | ORAL_TABLET | Freq: Two times a day (BID) | ORAL | Status: DC
Start: 2017-09-24 — End: 2017-09-26
  Administered 2017-09-24 – 2017-09-26 (×4): 12.5 mg via ORAL
  Filled 2017-09-24 (×4): qty 1

## 2017-09-24 MED ORDER — ONDANSETRON HCL 4 MG/2ML IJ SOLN
4.0000 mg | Freq: Four times a day (QID) | INTRAMUSCULAR | Status: DC | PRN
  Administered 2017-09-24: 4 mg via INTRAVENOUS
  Filled 2017-09-24: qty 2

## 2017-09-24 MED ORDER — SODIUM CHLORIDE 0.9 % IV SOLN
Freq: Once | INTRAVENOUS | Status: AC
  Administered 2017-09-24: 17:00:00 via INTRAVENOUS

## 2017-09-24 NOTE — Telephone Encounter (Signed)
-----   Message from Biagio Borg, MD sent at 09/24/2017 12:45 PM EST ----- Left message on MyChart, pt to cont same tx except  The test results show that your current treatment is OK, except the potassium is mildly low, the kidneys show at least moderate slowing, as well as mild low Vitamin D and low B12.  The kidney issue (acute renal failure) and low K are quite serious with your history; so I would recommend going Now to the Emergency Dept to assess for dehydration and low potassium  The low vitamins can be addressed at a later time.  I will ask the office to call you as well.   Shirron to please inform pt, and ask her to go to ED now

## 2017-09-24 NOTE — H&P (Signed)
History and Physical    Kathleen Guerrero:[id number] DOB: 16-Oct-1946 DOA: 09/24/2017  PCP: Golden Circle, FNP   Patient coming from: Home  Chief Complaint: Generalized weakness  HPI: Kathleen Guerrero is a 71 y.o. female with medical history significant of right upper lobe groundglass opacity, 1320 mm. Underwent right VATS and right upper lobe lobectomy, September 2018. She had a prolonged hospital stay, which was complicated with multiorgan failure, ventilator associated pneumonia, and metabolic encephalopathy with delirium. Eventually she was discharged to inpatient rehabilitation 08/20/2017, and from rehabilitation she was discharged 08/28/2017. Since she reached home she has been experiencing worsening weakness, more severe over the last 3 weeks, to the point where she is needing assistance for ambulation, it has been associated with decreased by mouth intake, there is no improving factors, and weakness is worse with exertion.  Denies fevers, chills, nausea, vomiting or diarrhea. She was seen by her primary care physician yesterday, and antidepressive agents were adjusted, and her kidney function was checked, discovering acute kidney injury, she was advised to come to the hospital for further evaluation.    ED Course: Patient was found very weak and dehydrated, her potassium was low, she was started on IV fluids and potassium repletion, referred for admission and further evaluation.   Review of Systems:  1. General: No fevers, no chills, no weight gain or weight loss, poor appetite.  2. ENT: No runny nose or sore throat, no hearing disturbances 3. Pulmonary: Dyspnea on exertion, no cough, wheezing, or hemoptysis 4. Cardiovascular: No angina, claudication, lower extremity edema, pnd or orthopnea 5. Gastrointestinal: No nausea or vomiting, no diarrhea or constipation 6. Hematology: No easy bruisability or frequent infections 7. Urology: No dysuria, hematuria or increased urinary  frequency 8. Dermatology: No rashes. 9. Neurology: No seizures or paresthesias, significant weakness with ambulatory dysfunction.  10. Musculoskeletal: No joint pain or deformities  Past Medical History:  Diagnosis Date  . Arthritis   . GERD (gastroesophageal reflux disease)    tums  . Headache   . Hepatitis    cmv 30 yrs ago hepatitis  . History of hiatal hernia   . History of loop recorder    after stroke  . Hypertension   . Stroke ([place]) 07/2016   balance a little off  . Tobacco use     Past Surgical History:  Procedure Laterality Date  . APPENDECTOMY    . DERMOID CYST  EXCISION     x2 52'  . EP IMPLANTABLE DEVICE N/A 07/23/2016   Procedure: Loop Recorder Insertion;  Surgeon: Thompson Grayer, MD;  Location: [facility];  Service: Cardiovascular;  Laterality: N/A;  . Intestinal obstruction  1979  . LOBECTOMY Right 07/30/2017   Procedure: LOBECTOMY;  Surgeon: [name], MD;  Location: [facility];  Service: Thoracic;  Laterality: Right;  . TEE WITHOUT CARDIOVERSION N/A 07/23/2016   Procedure: TRANSESOPHAGEAL ECHOCARDIOGRAM (TEE);  Surgeon: Lelon Perla, MD;  Location[facility];  Service: Cardiovascular;  Laterality: N/A;  . VIDEO ASSISTED THORACOSCOPY (VATS)/WEDGE RESECTION Right 07/30/2017   Procedure: VIDEO ASSISTED THORACOSCOPY (VATS)/WEDGE RESECTION;  Surgeon: [name], MD;  Location: [facility];  Service: Thoracic;  Laterality: Right;     reports that she quit smoking about 14 months ago. Her smoking use included cigarettes. She has a 50.00 pack-year smoking history. she has never used smokeless tobacco. She reports that she drinks alcohol. She reports that she does not use drugs.  No Known Allergies  Family History  Problem Relation Age of Onset  . Kidney disease Maternal Grandmother   . Cancer Maternal Grandfather     Prior to Admission medications   Medication Sig Start Date End Date Taking? Authorizing Provider  acetaminophen (TYLENOL)  325 MG tablet Take 650 mg by mouth daily as needed for mild pain or headache.   Yes [provider]  amiodarone (PACERONE) 200 MG tablet Take 1 tablet (200 mg total) by mouth daily. 08/27/17  Yes Angiulli, Lavon Paganini, PA-C  amLODipine (NORVASC) 10 MG tablet Take 1 tablet (10 mg total) by mouth daily. 08/27/17  Yes Angiulli, Lavon Paganini, PA-C  aspirin 325 MG tablet TAKE 1 TABLET BY MOUTH EVERY DAY 10/24/16  Yes Golden Circle, FNP  atorvastatin (LIPITOR) 40 MG tablet TAKE 1 TABLET BY MOUTH EVERY DAY AT 6 p.m. 08/27/17  Yes Angiulli, Lavon Paganini, PA-C  calcium carbonate (TUMS - DOSED IN MG ELEMENTAL CALCIUM) 500 MG chewable tablet Chew 1 tablet by mouth 4 (four) times daily as needed for indigestion or heartburn.   Yes [provider]  cloNIDine (CATAPRES) 0.1 MG tablet Take 1 tablet (0.1 mg total) by mouth 2 (two) times daily. 08/27/17  Yes Angiulli, Lavon Paganini, PA-C  metoprolol tartrate (LOPRESSOR) 25 MG tablet Take 0.5 tablets (12.5 mg total) by mouth 2 (two) times daily. 08/27/17  Yes Angiulli, Lavon Paganini, PA-C  pantoprazole (PROTONIX) 40 MG tablet Take 1 tablet (40 mg total) by mouth every 12 (twelve) hours. 08/27/17  Yes Angiulli, Lavon Paganini, PA-C  escitalopram (LEXAPRO) 10 MG tablet Take 1 tablet (10 mg total) by mouth daily. 09/23/17   Biagio Borg, MD  phosphorus (K PHOS NEUTRAL) [phone] MG tablet Take 2 tablets (500 mg total) by mouth 4 (four) times daily -  before meals and at bedtime. Patient not taking: Reported on 09/24/2017 08/27/17   Cathlyn Parsons, PA-C    Physical Exam: Vitals:   09/24/17 1424 09/24/17 1600  BP: 121/70 137/77  Pulse: (!) 102 97  Resp: 16 13  Temp: 98.9 F (37.2 C)   TempSrc: Oral   SpO2: 95% 98%    Constitutional: deconditioned and ill looking appearing Vitals:   09/24/17 1424 09/24/17 1600  BP: 121/70 137/77  Pulse: (!) 102 97  Resp: 16 13  Temp: 98.9 F (37.2 C)   TempSrc: Oral   SpO2: 95% 98%   Eyes: PERRL, lids and conjunctivae  mild pale with no icterus.  Head normocephalic, nose and ears with no deformities ENMT: Mucous membranes are dry.  Posterior pharynx clear of any exudate or lesions.Normal dentition.  Neck: normal, supple, no masses, no thyromegaly Respiratory: decreased breath sounds on auscultation bilaterally due to poor inspiratory effort, no wheezing, no crackles. Normal respiratory effort. No accessory muscle use.  Cardiovascular: Regular rate and rhythm, no murmurs / rubs / gallops. No extremity edema. 2+ pedal pulses. No carotid bruits.  Abdomen: no tenderness, no masses palpated. No hepatosplenomegaly. Bowel sounds positive.  Musculoskeletal: no clubbing / cyanosis. No joint deformity upper and lower extremities. Good ROM, no contractures. Normal muscle tone.  Skin: no rashes, lesions, ulcers. No induration Neurologic: CN 2-12 grossly intact. Sensation intact, DTR normal. Strength 5/4 in all 4.   Labs on Admission: I have personally reviewed following labs and imaging studies  CBC: Recent Labs  Lab 09/23/17 1729 09/24/17 1428  WBC 9.1 7.9  NEUTROABS 5.9 5.2  HGB 11.7* 11.3*  HCT 35.2* 34.0*  MCV 87.8 87.9  PLT 315.0 235   Basic Metabolic  Panel: Recent Labs  Lab 09/23/17 1729 09/24/17 1428  NA 137 136  K 3.0* 2.4*  CL 94* 98*  CO2 34* 28  GLUCOSE 120* 122*  BUN 13 12  CREATININE 1.60* 1.44*  CALCIUM 12.7* 11.9*   GFR: Estimated Creatinine Clearance: 35.6 mL/min (A) (by C-G formula based on SCr of 1.44 mg/dL (H)). Liver Function Tests: Recent Labs  Lab 09/23/17 1729 09/24/17 1428  AST 15 16  ALT 11 13*  ALKPHOS 63 66  BILITOT 0.6 0.6  PROT 7.6 6.9  ALBUMIN 3.8 3.3*   No results for input(s): LIPASE, AMYLASE in the last 168 hours. No results for input(s): AMMONIA in the last 168 hours. Coagulation Profile: No results for input(s): INR, PROTIME in the last 168 hours. Cardiac Enzymes: No results for input(s): CKTOTAL, CKMB, CKMBINDEX, TROPONINI in the last 168 hours. BNP  (last 3 results) No results for input(s): PROBNP in the last 8760 hours. HbA1C: No results for input(s): HGBA1C in the last 72 hours. CBG: No results for input(s): GLUCAP in the last 168 hours. Lipid Profile: No results for input(s): CHOL, HDL, LDLCALC, TRIG, CHOLHDL, LDLDIRECT in the last 72 hours. Thyroid Function Tests: Recent Labs    09/23/17 1729  TSH 1.91   Anemia Panel: Recent Labs    09/23/17 1729  VITAMINB12 169*   Urine analysis:    Component Value Date/Time   COLORURINE YELLOW 09/24/2017 1217   APPEARANCEUR Cloudy (A) 09/24/2017 1217   LABSPEC <=1.005 (A) 09/24/2017 1217   PHURINE 6.5 09/24/2017 1217   GLUCOSEU NEGATIVE 09/24/2017 1217   HGBUR TRACE-INTACT (A) 09/24/2017 1217   BILIRUBINUR NEGATIVE 09/24/2017 1217   KETONESUR NEGATIVE 09/24/2017 1217   PROTEINUR NEGATIVE 08/22/2017 1734   UROBILINOGEN 0.2 09/24/2017 1217   NITRITE POSITIVE (A) 09/24/2017 1217   LEUKOCYTESUR MODERATE (A) 09/24/2017 1217    Radiological Exams on Admission: Dg Chest 2 View  Result Date: 09/24/2017 CLINICAL DATA:  Weakness EXAM: CHEST  2 VIEW COMPARISON:  09/09/2017 FINDINGS: Cardiac shadow is within normal limits. Loop recorder is again noted. The lungs are well aerated bilaterally. Postsurgical changes are noted in the lateral right upper lobe. No sizable infiltrate or effusion is seen. Degenerative changes of thoracic spine are noted. IMPRESSION: Postsurgical change without acute abnormality. Electronically Signed   By: Inez Catalina M.D.   On: 09/24/2017 14:53    EKG: Independently reviewed. Sinus rhythm, rate of 102 beats per minute, normal axis, normal intervals, no significant T-wave changes.   Assessment/Plan Active Problems:   AKI (acute kidney injury) ([place])  71 year old female presents from home with significant worsening weakness, after a prolonged hospitalization in the intensive care unit, after a surgical procedure, right upper lobe lobectomy, per video assisted  thoracoscopy. Her weakness has been associated with significant decreased by mouth intake. On initial physical examination, blood pressure 121/70, temperature 98.9, heart rate 102, respiratory 16, oxygen saturation 95% on room air. Dry mucous membranes, lungs clear to auscultation, heart S1-S2 present rhythmic, abdomen soft nontender, no lower extremity edema. Sodium 136, potassium 2.4, chloride 98, bicarbonate 28, glucose 122, BUN 12, creat 1.44, white count 7.9, hemoglobin 11.3, hematocrit 34.0, platelets 296. Urine urinalysis with specific gravity less than 1.005, 11 to 20 white cells, positive nitrates. Chest x-ray with hyperinflation, loss of volume on the right, increased lung markings bilaterally.   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  1. Acute kidney injury with hypokalemia. Etiology likely prerenal related to poor oral intake. Patient will be  admitted to the medical ward, will continue as tonic IV fluids at 100 mL per hour. Patient has received 80 mEq of potassium chloride in the emergency department, we follow-up kidney function morning, avoid hypotension and nephrotoxic agents.   2. Clinical care neuropathy. Patient very weak and deconditioning, will ask physical therapy evaluation, continue hydration, continue supportive medical care, will request nutrition for consultation.  2. Hypertension. Will continue amlodipine 10 mg daily, clonidine 0.1 mg twice daily, metoprolol 12.5 mg twice daily, continue hydration with isotonic saline. Note that patient on amiodarone, will review old records for indication. For now continue therapy.   3. Dyslipidemia. Continue atorvastatin.  4. Depression. Continue escitalopram.  DVT prophylaxis: enoxaparin Code Status: full  Family Communication: no family at bedside  Disposition Plan: home  Consults called: None  Admission status: Inpatient.     Mauricio Gerome Apley MD [facility] Pager [phone]  If 7PM-7AM, please contact night-coverage [email or web address] Password [facility]  09/24/2017, 4:28 PM

## 2017-09-24 NOTE — ED Provider Notes (Signed)
Emergency Department Provider Note   I have reviewed the triage vital signs and the nursing notes.   HISTORY  Chief Complaint Weakness   HPI Kathleen Guerrero is a 71 y.o. female with PMH of CVA, HTN, GERD, and adenocarcinoma of the lung s/p lobectomy presents to the emergency department for evaluation of generalized weakness severe fatigue.  The patient saw her primary care physician yesterday who changed some of her depression medication and true labs.  Today the labs resulted showing AKI and hypokalemia.  Patient states that she is drinking lots of water but has not been eating well because food does not taste good to her.  She denies any fevers or chills.  No chest pain, cough, shortness of breath.  She had one episode of vomiting a week ago but her nausea and vomiting symptoms have resolved.  No abdominal pain.  No dysuria, hesitancy, urgency.  Past Medical History:  Diagnosis Date  . Arthritis   . GERD (gastroesophageal reflux disease)    tums  . Headache   . Hepatitis    cmv 30 yrs ago hepatitis  . History of hiatal hernia   . History of loop recorder    after stroke  . Hypertension   . Stroke ([place]) 07/2016   balance a little off  . Tobacco use     Patient Active Problem List   Diagnosis Date Noted  . AKI (acute kidney injury) ([place]) 09/24/2017  . Fatigue 09/23/2017  . Fall   . Recurrent major depressive disorder, in partial remission ([place])   . Acute lower UTI   . Labile blood pressure   . Essential hypertension   . Urinary frequency   . Hyponatremia   . Hypoalbuminemia due to protein-calorie malnutrition ([place])   . Incontinence of feces   . Critical illness myopathy 08/20/2017  . Debility   . Adenocarcinoma ([place])   . Anxiety and depression   . Agitation   . Dysphagia   . Acute blood loss anemia   . History of pneumothorax   . PAF (paroxysmal atrial fibrillation) ([place])   . History of CVA (cerebrovascular accident)   . Hyperlipidemia   . Hypernatremia   .  Leukocytosis   . S/P lobectomy of lung 07/30/2017  . Lower extremity edema 04/30/2017  . TIA (transient ischemic attack) 01/16/2017  . Dyslipidemia, goal LDL below 70 01/03/2017  . Diarrhea 09/12/2016  . Sleep disturbance 07/26/2016  . Overactive bladder 07/26/2016  . Tobacco abuse 07/21/2016  . Solitary pulmonary nodule 07/21/2016  . Acute CVA (cerebrovascular accident) ([place]) 07/21/2016  . Acute lacunar stroke ([place]) 07/20/2016  . Hypokalemia 07/20/2016  . Benign essential HTN 07/20/2016    Past Surgical History:  Procedure Laterality Date  . APPENDECTOMY    . DERMOID CYST  EXCISION     x2 2'  . EP IMPLANTABLE DEVICE N/A 07/23/2016   Procedure: Loop Recorder Insertion;  Surgeon: Thompson Grayer, MD;  Location: [facility];  Service: Cardiovascular;  Laterality: N/A;  . Intestinal obstruction  1979  . LOBECTOMY Right 07/30/2017   Procedure: LOBECTOMY;  Surgeon: [name], MD;  Location: [facility];  Service: Thoracic;  Laterality: Right;  . TEE WITHOUT CARDIOVERSION N/A 07/23/2016   Procedure: TRANSESOPHAGEAL ECHOCARDIOGRAM (TEE);  Surgeon: Lelon Perla, MD;  Location: [facility];  Service: Cardiovascular;  Laterality: N/A;  . VIDEO ASSISTED THORACOSCOPY (VATS)/WEDGE RESECTION Right 07/30/2017   Procedure: VIDEO ASSISTED THORACOSCOPY (VATS)/WEDGE RESECTION;  Surgeon: [name], MD;  Location:  [facility];  Service: Thoracic;  Laterality: Right;    Current Outpatient Rx  . Order #: [id number] Class: Historical Med  . Order #: [id number] Class: Print  . Order #: [id number] Class: Print  . Order #: [id number] Class: Normal  . Order #: [id number] Class: Print  . Order #: [id number] Class: Historical Med  . Order #: [id number] Class: Print  . Order #: [id number] Class: Print  . Order #: [id number] Class: Print  . Order #: [id number] Class: Normal  . Order #: [id number] Class: Print    Allergies Patient has no known allergies.  Family History  Problem Relation Age of Onset    . Kidney disease Maternal Grandmother   . Cancer Maternal Grandfather     Social History Social History   Tobacco Use  . Smoking status: Former Smoker    Packs/day: 1.00    Years: 50.00    Pack years: 50.00    Types: Cigarettes    Last attempt to quit: 07/20/2016    Years since quitting: 1.1  . Smokeless tobacco: Never Used  Substance Use Topics  . Alcohol use: Yes    Comment: Rarely  . Drug use: No    Review of Systems  Constitutional: No fever/chills. Positive generalized weakness and unintentional weight loss.  Eyes: No visual changes. ENT: No sore throat. Cardiovascular: Denies chest pain. Respiratory: Denies shortness of breath. Gastrointestinal: No abdominal pain.  No nausea, no vomiting.  No diarrhea.  No constipation. Genitourinary: Negative for dysuria. Musculoskeletal: Negative for back pain. Skin: Negative for rash. Neurological: Negative for headaches, focal weakness or numbness.  10-point ROS otherwise negative.  ____________________________________________   PHYSICAL EXAM:  VITAL SIGNS: ED Triage Vitals  Enc Vitals Group     BP 09/24/17 1424 121/70     Pulse Rate 09/24/17 1424 (!) 102     Resp 09/24/17 1424 16     Temp 09/24/17 1424 98.9 F (37.2 C)     Temp Source 09/24/17 1424 Oral     SpO2 09/24/17 1424 95 %     Pain Score 09/24/17 1422 0   Constitutional: Alert and oriented. Well appearing and in no acute distress. Eyes: Conjunctivae are normal.  Head: Atraumatic. Nose: No congestion/rhinnorhea. Mouth/Throat: Mucous membranes are dry.  Neck: No stridor. Cardiovascular: Normal rate, regular rhythm. Good peripheral circulation. Grossly normal heart sounds.   Respiratory: Normal respiratory effort.  No retractions. Lungs CTAB. Gastrointestinal: Soft and nontender. No distention.  Musculoskeletal: No lower extremity tenderness nor edema. No gross deformities of extremities. Neurologic:  Normal speech and language. No gross focal  neurologic deficits are appreciated.  Skin:  Skin is warm, dry and intact. No rash noted.  ____________________________________________   LABS (all labs ordered are listed, but only abnormal results are displayed)  Labs Reviewed  COMPREHENSIVE METABOLIC PANEL - Abnormal; Notable for the following components:      Result Value   Potassium 2.4 (*)    Chloride 98 (*)    Glucose, Bld 122 (*)    Creatinine, Ser 1.44 (*)    Calcium 11.9 (*)    Albumin 3.3 (*)    ALT 13 (*)    GFR calc non Af Amer 36 (*)    GFR calc Af Amer 42 (*)    All other components within normal limits  CBC WITH DIFFERENTIAL/PLATELET - Abnormal; Notable for the following components:   Hemoglobin 11.3 (*)    HCT 34.0 (*)    All other components within normal limits  URINALYSIS, ROUTINE W REFLEX MICROSCOPIC  I-STAT CG4 LACTIC ACID, ED  I-STAT CG4 LACTIC ACID, ED   ____________________________________________  EKG   EKG Interpretation  Date/Time:  Wednesday September 24 2017 14:23:57 EST Ventricular Rate:  102 PR Interval:  154 QRS Duration: 104 QT Interval:  368 QTC Calculation: 479 R Axis:   61 Text Interpretation:  Sinus tachycardia Nonspecific ST abnormality Abnormal ECG No STEMI.  Confirmed by Nanda Quinton [phone]) on 09/24/2017 4:03:18 PM       ____________________________________________  RADIOLOGY  Dg Chest 2 View  Result Date: 09/24/2017 CLINICAL DATA:  Weakness EXAM: CHEST  2 VIEW COMPARISON:  09/09/2017 FINDINGS: Cardiac shadow is within normal limits. Loop recorder is again noted. The lungs are well aerated bilaterally. Postsurgical changes are noted in the lateral right upper lobe. No sizable infiltrate or effusion is seen. Degenerative changes of thoracic spine are noted. IMPRESSION: Postsurgical change without acute abnormality. Electronically Signed   By: Inez Catalina M.D.   On: 09/24/2017 14:53    ____________________________________________   PROCEDURES  Procedure(s)  performed:   Procedures  None ____________________________________________   INITIAL IMPRESSION / ASSESSMENT AND PLAN / ED COURSE  Pertinent labs & imaging results that were available during my care of the patient were reviewed by me and considered in my medical decision making (see chart for details).  Patient presents to the emergency department for evaluation of generalized weakness.  On labs she has worsening renal function and hypokalemia.  EKG with no acute changes.  She is persistently tachycardic and appears volume depleted.  She has been drinking lots of water at home by report but continues to feel poorly.  Patient is frail-appearing.  Given the patient's home resources and lack of improvement with attempted oral rehydration at home plan for admission for IV fluids, repeat renal labs, potassium repletion.   Discussed patient's case with Hospitalist, Dr. Cathlean Sauer to request admission. Patient and family (if present) updated with plan. Care transferred to Hospitalist service.  I reviewed all nursing notes, vitals, pertinent old records, EKGs, labs, imaging (as available).  ____________________________________________  FINAL CLINICAL IMPRESSION(S) / ED DIAGNOSES  Final diagnoses:  AKI (acute kidney injury) ([place])  Hypokalemia  Generalized weakness  Dehydration     MEDICATIONS GIVEN DURING THIS VISIT:  Medications  sodium chloride 0.9 % bolus 500 mL (not administered)  potassium chloride 10 mEq in 100 mL IVPB (not administered)  potassium chloride SA (K-DUR,KLOR-CON) CR tablet 40 mEq (not administered)  0.9 %  sodium chloride infusion (not administered)    Note:  This document was prepared using Dragon voice recognition software and may include unintentional dictation errors.  Nanda Quinton, MD Emergency Medicine    Long, Wonda [facility], MD 09/24/17 [phone]

## 2017-09-24 NOTE — ED Notes (Signed)
Pt aware that urine specimen needed and will call when she is able to .

## 2017-09-24 NOTE — ED Triage Notes (Addendum)
Pt had a lobectomy in September and has had difficulty recovery. She states that she has had generalized weakness. Pt is alert and oriented but still feels weak. Pt states she has lost 10 pounds in 2 weeks.

## 2017-09-24 NOTE — Telephone Encounter (Signed)
Pt has been informed and doesn't want to go to the ED/ER because she said she was recently gotten out from a extended stay in ICU stating that she almost died. She wanted to come here to the lab and I explained we do not treat dehydration here due to the lack of equipment needed. I did inform her if she went again medical advise I would have to document and let JJ know of the denial.   After speaking with JJ he suggested she go to [facility]. I called the patient back and let her know. She agreed to go.

## 2017-09-24 NOTE — ED Notes (Signed)
Dinner tray ordered.

## 2017-09-24 NOTE — ED Notes (Signed)
Unsuccessful attempt at giving report.  

## 2017-09-25 DIAGNOSIS — E860 Dehydration: Secondary | ICD-10-CM | POA: Diagnosis not present

## 2017-09-25 DIAGNOSIS — N179 Acute kidney failure, unspecified: Secondary | ICD-10-CM | POA: Diagnosis not present

## 2017-09-25 DIAGNOSIS — R531 Weakness: Secondary | ICD-10-CM | POA: Diagnosis not present

## 2017-09-25 DIAGNOSIS — E876 Hypokalemia: Secondary | ICD-10-CM | POA: Diagnosis not present

## 2017-09-25 LAB — COMPREHENSIVE METABOLIC PANEL
ALBUMIN: 2.7 g/dL — AB (ref 3.5–5.0)
ALK PHOS: 52 U/L (ref 38–126)
ALT: 10 U/L — AB (ref 14–54)
ANION GAP: 7 (ref 5–15)
AST: 13 U/L — ABNORMAL LOW (ref 15–41)
BUN: 9 mg/dL (ref 6–20)
CALCIUM: 10.2 mg/dL (ref 8.9–10.3)
CHLORIDE: 101 mmol/L (ref 101–111)
CO2: 28 mmol/L (ref 22–32)
Creatinine, Ser: 1.2 mg/dL — ABNORMAL HIGH (ref 0.44–1.00)
GFR calc non Af Amer: 45 mL/min — ABNORMAL LOW (ref >60)
GFR, EST AFRICAN AMERICAN: 52 mL/min — AB (ref >60)
GLUCOSE: 98 mg/dL (ref 65–99)
Potassium: 2.7 mmol/L — CL (ref 3.5–5.1)
SODIUM: 136 mmol/L (ref 135–145)
Total Bilirubin: 0.8 mg/dL (ref 0.3–1.2)
Total Protein: 5.6 g/dL — ABNORMAL LOW (ref 6.5–8.1)

## 2017-09-25 LAB — MAGNESIUM: MAGNESIUM: 1.1 mg/dL — AB (ref 1.7–2.4)

## 2017-09-25 LAB — CBC
HEMATOCRIT: 28.4 % — AB (ref 36.0–46.0)
HEMOGLOBIN: 9.2 g/dL — AB (ref 12.0–15.0)
MCH: 28.7 pg (ref 26.0–34.0)
MCHC: 32.4 g/dL (ref 30.0–36.0)
MCV: 88.5 fL (ref 78.0–100.0)
Platelets: 254 10*3/uL (ref 150–400)
RBC: 3.21 MIL/uL — AB (ref 3.87–5.11)
RDW: 15.5 % (ref 11.5–15.5)
WBC: 7.3 10*3/uL (ref 4.0–10.5)

## 2017-09-25 LAB — GLUCOSE, CAPILLARY: Glucose-Capillary: 107 mg/dL — ABNORMAL HIGH (ref 65–99)

## 2017-09-25 MED ORDER — POTASSIUM CHLORIDE 2 MEQ/ML IV SOLN
INTRAVENOUS | Status: DC
  Administered 2017-09-25 – 2017-09-26 (×2): via INTRAVENOUS
  Filled 2017-09-25 (×3): qty 1000

## 2017-09-25 MED ORDER — MAGNESIUM SULFATE 4 GM/100ML IV SOLN
4.0000 g | Freq: Once | INTRAVENOUS | Status: AC
  Administered 2017-09-25: 4 g via INTRAVENOUS
  Filled 2017-09-25: qty 100

## 2017-09-25 MED ORDER — POTASSIUM CHLORIDE CRYS ER 20 MEQ PO TBCR
40.0000 meq | EXTENDED_RELEASE_TABLET | Freq: Once | ORAL | Status: AC
  Administered 2017-09-25: 40 meq via ORAL
  Filled 2017-09-25: qty 2

## 2017-09-25 MED ORDER — POTASSIUM CHLORIDE 10 MEQ/100ML IV SOLN
10.0000 meq | INTRAVENOUS | Status: AC
  Administered 2017-09-25 (×3): 10 meq via INTRAVENOUS
  Filled 2017-09-25 (×3): qty 100

## 2017-09-25 NOTE — Progress Notes (Addendum)
PROGRESS NOTE    Kathleen Guerrero  SJG:[id number] DOB: 1946/01/15 DOA: 09/24/2017 PCP: Biagio Borg, MD   Chief Complaint  Patient presents with  . Weakness    Brief Narrative:  HPI on 09/24/2017 by Dr. Riccardo Dubin Kathleen Guerrero is a 71 y.o. female with medical history significant of right upper lobe groundglass opacity, 1320 mm. Underwent right VATS and right upper lobe lobectomy, September 2018. She had a prolonged hospital stay, which was complicated with multiorgan failure, ventilator associated pneumonia, and metabolic encephalopathy with delirium. Eventually she was discharged to inpatient rehabilitation 08/20/2017, and from rehabilitation she was discharged 08/28/2017. Since she reached home she has been experiencing worsening weakness, more severe over the last 3 weeks, to the point where she is needing assistance for ambulation, it has been associated with decreased by mouth intake, there is no improving factors, and weakness is worse with exertion.  Denies fevers, chills, nausea, vomiting or diarrhea. She was seen by her primary care physician yesterday, and antidepressive agents were adjusted, and her kidney function was checked, discovering acute kidney injury, she was advised to come to the hospital for further evaluation.   Assessment & Plan   Acute kidney injury with hypokalemia -suspect prerenal due to poor oral intake -Creatinine upon admission 1.44, baseline 0.8-0.9 -Currently creatinine 1.2 -Continue IVF, however will change to D5W with potassium supplementation  -Continue to monitor BMP  Possible UTI -UA shows TNTC WBC, many bacteria, large leukocytes, positive nitrites -urine culture not obtained on admission -complains of not being able to urinate yesterday, however able to now -will continue to monitor intake/output -will hold off on antibiotics for now, as patient was recently treated with cipro prior to admission  Hypomagnesemia -will replace and  continue to monitor   Critical care neuropathy/myopathy/ fatigue -recently had prolonged hospital stay -suspect depression is playing a role in this -PT/OT consulted   Essential hypertension -Continue amlodipine, clonidine, metoprolol  Anxiety/depression -Recently placed on lexapro by PCP  Dyslipidemia  -Continue statin  Paroxysmal atrial fibrillation -currently in sinus rhythm -continue amiodarone, metoprolol, aspirin -not sure why patient is not on anticoagulation  -CHADSVASC 5 (age, gender, H/o CVA, HTN)  History of RUL opacity -s/p VATS and RUL lobectomy in September 2018  DVT Prophylaxis  heparin  Code Status: Full  Family Communication: None at bedside  Disposition Plan: Admitted, continue to monitor   Consultants None  Procedures  None  Antibiotics   Anti-infectives (From admission, onward)   Start     Dose/Rate Route Frequency Ordered Stop   09/24/17 1745  cefTRIAXone (ROCEPHIN) 1 g in dextrose 5 % 50 mL IVPB     1 g 100 mL/hr over 30 Minutes Intravenous  Once 09/24/17 1742 09/24/17 1922      Subjective:   Kathleen Guerrero seen and examined today.  Continues to feel weak. Would like to "bounce back." Complains of poor appetite. Denies current chest pain, shortness of breath, abdominal pain, nausea, diarrhea. Had one episode of vomiting yesterday. Has not had a bowel movement in a few days. Denies dizziness or headache.   Objective:   Vitals:   09/24/17 1915 09/24/17 1930 09/24/17 2100 09/25/17 0444  BP: (!) 148/73 (!) 155/78 134/67 137/84  Pulse: 91 93 92 83  Resp: (!) 22 18 18 18   Temp:   99 F (37.2 C) 98.5 F (36.9 C)  TempSrc:   Oral Oral  SpO2: 94% 98% 93% 94%    Intake/Output Summary (Last 24 hours) at 09/25/2017  1240 Last data filed at 09/25/2017 6599 Gross per 24 hour  Intake 2178.33 ml  Output 550 ml  Net 1628.33 ml   There were no vitals filed for this visit.  Exam  General: Well developed, well nourished, NAD, appears stated  age  9: NCAT,mucous membranes moist.   Cardiovascular: S1 S2 auscultated, no rubs, murmurs or gallops. Regular rate and rhythm.  Respiratory: Clear to auscultation bilaterally with equal chest rise  Abdomen: Soft, nontend er, nondistended, + bowel sounds  Extremities: warm dry without cyanosis clubbing or edema  Neuro: AAOx3, nonfocal  Psych: Flat affect, however appropriate   Data Reviewed: I have personally reviewed following labs and imaging studies  CBC: Recent Labs  Lab 09/23/17 1729 09/24/17 1428 09/25/17 0256  WBC 9.1 7.9 7.3  NEUTROABS 5.9 5.2  --   HGB 11.7* 11.3* 9.2*  HCT 35.2* 34.0* 28.4*  MCV 87.8 87.9 88.5  PLT 315.0 296 357   Basic Metabolic Panel: Recent Labs  Lab 09/23/17 1729 09/24/17 1428 09/25/17 0256  NA 137 136 136  K 3.0* 2.4* 2.7*  CL 94* 98* 101  CO2 34* 28 28  GLUCOSE 120* 122* 98  BUN 13 12 9   CREATININE 1.60* 1.44* 1.20*  CALCIUM 12.7* 11.9* 10.2  MG  --   --  1.1*   GFR: Estimated Creatinine Clearance: 42.7 mL/min (A) (by C-G formula based on SCr of 1.2 mg/dL (H)). Liver Function Tests: Recent Labs  Lab 09/23/17 1729 09/24/17 1428 09/25/17 0256  AST 15 16 13*  ALT 11 13* 10*  ALKPHOS 63 66 52  BILITOT 0.6 0.6 0.8  PROT 7.6 6.9 5.6*  ALBUMIN 3.8 3.3* 2.7*   No results for input(s): LIPASE, AMYLASE in the last 168 hours. No results for input(s): AMMONIA in the last 168 hours. Coagulation Profile: No results for input(s): INR, PROTIME in the last 168 hours. Cardiac Enzymes: No results for input(s): CKTOTAL, CKMB, CKMBINDEX, TROPONINI in the last 168 hours. BNP (last 3 results) No results for input(s): PROBNP in the last 8760 hours. HbA1C: No results for input(s): HGBA1C in the last 72 hours. CBG: Recent Labs  Lab 09/25/17 0749  GLUCAP 107*   Lipid Profile: No results for input(s): CHOL, HDL, LDLCALC, TRIG, CHOLHDL, LDLDIRECT in the last 72 hours. Thyroid Function Tests: Recent Labs    09/23/17 1729  TSH  1.91   Anemia Panel: Recent Labs    09/23/17 1729  VITAMINB12 169*   Urine analysis:    Component Value Date/Time   COLORURINE YELLOW 09/24/2017 1710   APPEARANCEUR HAZY (A) 09/24/2017 1710   LABSPEC 1.011 09/24/2017 1710   PHURINE 6.0 09/24/2017 1710   GLUCOSEU NEGATIVE 09/24/2017 1710   GLUCOSEU NEGATIVE 09/24/2017 1217   HGBUR SMALL (A) 09/24/2017 1710   BILIRUBINUR NEGATIVE 09/24/2017 1710   KETONESUR NEGATIVE 09/24/2017 1710   PROTEINUR NEGATIVE 09/24/2017 1710   UROBILINOGEN 0.2 09/24/2017 1217   NITRITE POSITIVE (A) 09/24/2017 1710   LEUKOCYTESUR LARGE (A) 09/24/2017 1710   Sepsis Labs: @LABRCNTIP (procalcitonin:4,lacticidven:4)  )No results found for this or any previous visit (from the past 240 hour(s)).    Radiology Studies: Dg Chest 2 View  Result Date: 09/24/2017 CLINICAL DATA:  Weakness EXAM: CHEST  2 VIEW COMPARISON:  09/09/2017 FINDINGS: Cardiac shadow is within normal limits. Loop recorder is again noted. The lungs are well aerated bilaterally. Postsurgical changes are noted in the lateral right upper lobe. No sizable infiltrate or effusion is seen. Degenerative changes of thoracic spine are noted. IMPRESSION:  Postsurgical change without acute abnormality. Electronically Signed   By: Inez Catalina M.D.   On: 09/24/2017 14:53     Scheduled Meds: . amiodarone  200 mg Oral Daily  . amLODipine  10 mg Oral Daily  . aspirin  325 mg Oral Daily  . atorvastatin  40 mg Oral q1800  . cloNIDine  0.1 mg Oral BID  . escitalopram  10 mg Oral Daily  . heparin  5,000 Units Subcutaneous Q8H  . metoprolol tartrate  12.5 mg Oral BID   Continuous Infusions: . dextrose 5 % 1,000 mL with potassium chloride 60 mEq infusion 75 mL/hr at 09/25/17 0821     LOS: 1 day   Time Spent in minutes   30 minutes  Shabnam Ladd D.O. on 09/25/2017 at 12:40 PM  Between 7am to 7pm - Pager - [phone]  After 7pm go to [email or web address] - password [email or web address]  And look for the night  coverage person covering for me after hours  [facility] Office  [phone]

## 2017-09-26 DIAGNOSIS — R531 Weakness: Secondary | ICD-10-CM | POA: Diagnosis not present

## 2017-09-26 DIAGNOSIS — N179 Acute kidney failure, unspecified: Secondary | ICD-10-CM | POA: Diagnosis not present

## 2017-09-26 DIAGNOSIS — E876 Hypokalemia: Secondary | ICD-10-CM | POA: Diagnosis not present

## 2017-09-26 DIAGNOSIS — E860 Dehydration: Secondary | ICD-10-CM | POA: Diagnosis not present

## 2017-09-26 LAB — CBC
HCT: 29.7 % — ABNORMAL LOW (ref 36.0–46.0)
HEMOGLOBIN: 9.6 g/dL — AB (ref 12.0–15.0)
MCH: 28.9 pg (ref 26.0–34.0)
MCHC: 32.3 g/dL (ref 30.0–36.0)
MCV: 89.5 fL (ref 78.0–100.0)
Platelets: 247 10*3/uL (ref 150–400)
RBC: 3.32 MIL/uL — AB (ref 3.87–5.11)
RDW: 15.8 % — ABNORMAL HIGH (ref 11.5–15.5)
WBC: 6.3 10*3/uL (ref 4.0–10.5)

## 2017-09-26 LAB — BASIC METABOLIC PANEL
Anion gap: 5 (ref 5–15)
BUN: 7 mg/dL (ref 6–20)
CHLORIDE: 105 mmol/L (ref 101–111)
CO2: 25 mmol/L (ref 22–32)
CREATININE: 1.26 mg/dL — AB (ref 0.44–1.00)
Calcium: 9 mg/dL (ref 8.9–10.3)
GFR calc Af Amer: 49 mL/min — ABNORMAL LOW (ref >60)
GFR calc non Af Amer: 42 mL/min — ABNORMAL LOW (ref >60)
GLUCOSE: 122 mg/dL — AB (ref 65–99)
POTASSIUM: 4.1 mmol/L (ref 3.5–5.1)
Sodium: 135 mmol/L (ref 135–145)

## 2017-09-26 LAB — MAGNESIUM: Magnesium: 1.9 mg/dL (ref 1.7–2.4)

## 2017-09-26 MED ORDER — PREMIER PROTEIN SHAKE
11.0000 [oz_av] | Freq: Two times a day (BID) | ORAL | Status: DC
  Administered 2017-09-26: 11 [oz_av] via ORAL
  Filled 2017-09-26 (×3): qty 325.31

## 2017-09-26 NOTE — Discharge Summary (Signed)
Discharge Summary  Kathleen Guerrero:[id number] DOB: [birth year]  PCP: Biagio Borg, MD  Admit date: 09/24/2017 Discharge date: 09/26/2017  Time spent: <30 mins  Recommendations for Outpatient Follow-up:  1. PCP   Discharge Diagnoses:  Active Hospital Problems   Diagnosis Date Noted  . AKI (acute kidney injury) ([place]) 09/24/2017    Resolved Hospital Problems  No resolved problems to display.    Discharge Condition: Stable  Diet recommendation: Heart healthy  Vitals:   09/26/17 0504 09/26/17 0838  BP: 111/65 121/66  Pulse: 72 73  Resp: 16 18  Temp: 98.5 F (36.9 C) 98.6 F (37 C)  SpO2: 97% 98%    History of present illness:  Kathleen S Burnetis a 70 y.o.femalewith medical history significant ofright upper lobe groundglass opacity, 1320 mm. Underwent right VATS and right upper lobe lobectomy, September 2018. She had a prolonged hospital stay, which was complicatedwith multiorgan failure, ventilator associated pneumonia, and metabolic encephalopathy with delirium. Eventually she was discharged to inpatient rehabilitation 08/20/2017, and from rehabilitation she was discharged 08/28/2017. Since she reached home she has been experiencing worsening weakness, more severe over the last 3 weeks, to the point where she is needing assistance for ambulation, it has been associated with decreased by mouth intake, there is no improving factors, and weakness is worse with exertion. Denies fevers, chills, nausea, vomiting or diarrhea. She was seen by her primary care physician yesterday, and antidepressive agents were adjusted, and her kidney function was checked, discovering acute kidney injury, she was advised to come to the hospital for further evaluation.  Today, patient reported feeling well overall.  Denied any chest pain, shortness of breath, cough, abdominal pain, nausea/vomiting, diarrhea/constipation, dizziness, fever/chills, dysuria.  Patient stable for discharge.  Hospital  Course:  Active Problems:   AKI (acute kidney injury) ([place])  Acute kidney injury with hypokalemia -Resolved hypokalemia -suspect prerenal due to poor oral intake -Creatinine upon admission 1.44, baseline 0.8-0.9 -Currently creatinine around 1.2, advised to increase oral intake as well as stay hydrated.  Patient verbalized understanding Follow-up with PCP to monitor closely  Possible UTI Unlikely -UA shows TNTC WBC, many bacteria, large leukocytes, positive nitrites -urine culture on 11/22, showed no growth.  No need for any antibiotics  Hypomagnesemia Resolved  Critical care neuropathy/myopathy/ fatigue -recently had prolonged hospital stay -suspect depression is playing a role in this -PT/OT consulted, recommend outpatient PT of which patient has  Essential hypertension -Continue amlodipine, clonidine, metoprolol  Anxiety/depression -Recently placed on lexapro by PCP  Dyslipidemia  -Continue statin  Paroxysmal atrial fibrillation -currently in sinus rhythm -continue amiodarone, metoprolol, aspirin -not sure why patient is not on anticoagulation  -CHADSVASC 5 (age, gender, H/o CVA, HTN) PCP to follow up  History of RUL opacity Stable -s/p VATS and RUL lobectomy in September 2018   Procedures:  None  Consultations:  None  Discharge Exam: BP 121/66 (BP Location: Right Arm)   Pulse 73   Temp 98.6 F (37 C) (Oral)   Resp 18   Wt 81.6 kg (179 lb 14.4 oz)   SpO2 98%   BMI 32.12 kg/m   General: Awake, alert, oriented x3 Cardiovascular: S1-S2 present, no added heart sounds Respiratory: Chest clear bilaterally  Discharge Instructions You were cared for by a hospitalist during your hospital stay. If you have any questions about your discharge medications or the care you received while you were in the hospital after you are discharged, you can call the unit and asked to speak with the hospitalist  on call if the hospitalist that took care of you is not  available. Once you are discharged, your primary care physician will handle any further medical issues. Please note that NO REFILLS for any discharge medications will be authorized once you are discharged, as it is imperative that you return to your primary care physician (or establish a relationship with a primary care physician if you do not have one) for your aftercare needs so that they can reassess your need for medications and monitor your lab values.  Discharge Instructions    Diet - low sodium heart healthy   Complete by:  As directed    Increase activity slowly   Complete by:  As directed      Allergies as of 09/26/2017   No Known Allergies     Medication List    TAKE these medications   acetaminophen 325 MG tablet Commonly known as:  TYLENOL Take 650 mg by mouth daily as needed for mild pain or headache.   amiodarone 200 MG tablet Commonly known as:  PACERONE Take 1 tablet (200 mg total) by mouth daily.   amLODipine 10 MG tablet Commonly known as:  NORVASC Take 1 tablet (10 mg total) by mouth daily.   aspirin 325 MG tablet TAKE 1 TABLET BY MOUTH EVERY DAY   atorvastatin 40 MG tablet Commonly known as:  LIPITOR TAKE 1 TABLET BY MOUTH EVERY DAY AT 6 p.m.   calcium carbonate 500 MG chewable tablet Commonly known as:  TUMS - dosed in mg elemental calcium Chew 1 tablet by mouth 4 (four) times daily as needed for indigestion or heartburn.   cloNIDine 0.1 MG tablet Commonly known as:  CATAPRES Take 1 tablet (0.1 mg total) by mouth 2 (two) times daily.   escitalopram 10 MG tablet Commonly known as:  LEXAPRO Take 1 tablet (10 mg total) by mouth daily.   metoprolol tartrate 25 MG tablet Commonly known as:  LOPRESSOR Take 0.5 tablets (12.5 mg total) by mouth 2 (two) times daily.   pantoprazole 40 MG tablet Commonly known as:  PROTONIX Take 1 tablet (40 mg total) by mouth every 12 (twelve) hours.   phosphorus 155-852-130 MG tablet Commonly known as:  K PHOS  NEUTRAL Take 2 tablets (500 mg total) by mouth 4 (four) times daily -  before meals and at bedtime.      No Known Allergies Follow-up Information    Biagio Borg, MD. Schedule an appointment as soon as possible for a visit in 1 week(s).   Specialties:  Internal Medicine, Radiology Contact information: [address] [place] [address] [phone]            The results of significant diagnostics from this hospitalization (including imaging, microbiology, ancillary and laboratory) are listed below for reference.    Significant Diagnostic Studies: Dg Chest 2 View  Result Date: 09/24/2017 CLINICAL DATA:  Weakness EXAM: CHEST  2 VIEW COMPARISON:  09/09/2017 FINDINGS: Cardiac shadow is within normal limits. Loop recorder is again noted. The lungs are well aerated bilaterally. Postsurgical changes are noted in the lateral right upper lobe. No sizable infiltrate or effusion is seen. Degenerative changes of thoracic spine are noted. IMPRESSION: Postsurgical change without acute abnormality. Electronically Signed   By: Inez Catalina M.D.   On: 09/24/2017 14:53   Dg Chest 2 View  Result Date: 09/09/2017 CLINICAL DATA:  Post VATS on the right on 07/30/2017, followup EXAM: CHEST  2 VIEW COMPARISON:  Chest x-ray of  08/19/2017 FINDINGS: No active infiltrate or effusion is seen. There is some volume loss on the right and surgical sutures and clips overlie the right hilum. The heart is within normal limits in size. No acute bony abnormality is seen. Loop recorder again is noted implanted in the soft tissues of the anterior left chest. IMPRESSION: No active lung disease. Electronically Signed   By: Ivar Drape M.D.   On: 09/09/2017 11:10    Microbiology: Recent Results (from the past 240 hour(s))  Urine Culture     Status: None   Collection Time: 09/25/17  2:06 AM  Result Value Ref Range Status   Specimen Description URINE, CLEAN CATCH  Final   Special Requests NONE  Final   Culture  NO GROWTH  Final   Report Status 09/26/2017 FINAL  Final     Labs: Basic Metabolic Panel: Recent Labs  Lab 09/23/17 1729 09/24/17 1428 09/25/17 0256 09/26/17 0723  NA 137 136 136 135  K 3.0* 2.4* 2.7* 4.1  CL 94* 98* 101 105  CO2 34* 28 28 25   GLUCOSE 120* 122* 98 122*  BUN 13 12 9 7   CREATININE 1.60* 1.44* 1.20* 1.26*  CALCIUM 12.7* 11.9* 10.2 9.0  MG  --   --  1.1* 1.9   Liver Function Tests: Recent Labs  Lab 09/23/17 1729 09/24/17 1428 09/25/17 0256  AST 15 16 13*  ALT 11 13* 10*  ALKPHOS 63 66 52  BILITOT 0.6 0.6 0.8  PROT 7.6 6.9 5.6*  ALBUMIN 3.8 3.3* 2.7*   No results for input(s): LIPASE, AMYLASE in the last 168 hours. No results for input(s): AMMONIA in the last 168 hours. CBC: Recent Labs  Lab 09/23/17 1729 09/24/17 1428 09/25/17 0256 09/26/17 0723  WBC 9.1 7.9 7.3 6.3  NEUTROABS 5.9 5.2  --   --   HGB 11.7* 11.3* 9.2* 9.6*  HCT 35.2* 34.0* 28.4* 29.7*  MCV 87.8 87.9 88.5 89.5  PLT 315.0 296 254 247   Cardiac Enzymes: No results for input(s): CKTOTAL, CKMB, CKMBINDEX, TROPONINI in the last 168 hours. BNP: BNP (last 3 results) No results for input(s): BNP in the last 8760 hours.  ProBNP (last 3 results) No results for input(s): PROBNP in the last 8760 hours.  CBG: Recent Labs  Lab 09/25/17 0749  GLUCAP 107*       Signed:  Alma Friendly, MD [facility] 09/26/2017, 8:28 PM

## 2017-09-26 NOTE — Progress Notes (Signed)
Initial Nutrition Assessment  DOCUMENTATION CODES:   Obesity unspecified  INTERVENTION:    Premier Protein PO BID, each supplement provides 160 kcal and 30 gm protein  NUTRITION DIAGNOSIS:   Inadequate oral intake related to poor appetite as evidenced by per patient/family report.  GOAL:   Patient will meet greater than or equal to 90% of their needs  MONITOR:   PO intake, Supplement acceptance  REASON FOR ASSESSMENT:   Consult Assessment of nutrition requirement/status  ASSESSMENT:   71 yo female with PMH of RUL groundglass opacity, s/p R VATS and RUL lobectomy in September complicated by VDRF, VAP, multiorgan failure; required inpatient rehab stay from 10/17-10/25. Admitted on 11/21 with worsening weakness.  Patient reports poor intake since her surgery in September. She has had a poor appetite for the past 2 months and has lost some weight. She does not want to gain any weight back, but plans to start drinking a PO supplement when she is discharged home. She thinks she will be discharged tomorrow. Discussed supplement options and she agreed to try Premier Protein supplement given ongoing poor appetite. Current weight is above her usual stated weight. Labs and medications reviewed.  NUTRITION - FOCUSED PHYSICAL EXAM:    Most Recent Value  Orbital Region  No depletion  Upper Arm Region  No depletion  Thoracic and Lumbar Region  No depletion  Buccal Region  No depletion  Temple Region  Mild depletion  Clavicle Bone Region  No depletion  Clavicle and Acromion Bone Region  No depletion  Scapular Bone Region  No depletion  Dorsal Hand  No depletion  Patellar Region  No depletion  Anterior Thigh Region  No depletion  Posterior Calf Region  No depletion  Edema (RD Assessment)  None  Hair  Reviewed  Eyes  Reviewed  Mouth  Reviewed  Skin  Reviewed  Nails  Reviewed       Diet Order:  Diet Heart Room service appropriate? Yes; Fluid consistency: Thin  EDUCATION  NEEDS:   No education needs have been identified at this time  Skin:  Skin Assessment: Reviewed RN Assessment  Last BM:  11/22  Height:   Ht Readings from Last 1 Encounters:  09/23/17 5' 2.75" (1.594 m)    Weight:   Wt Readings from Last 1 Encounters:  09/26/17 179 lb 14.4 oz (81.6 kg)    Ideal Body Weight:  51.7 kg  BMI:  Body mass index is 32.12 kg/m.  Estimated Nutritional Needs:   Kcal:  1400-1600  Protein:  80-100 gm  Fluid:  >/= 1.5 L   Molli Barrows, RD, LDN, [place] Pager [phone] After Hours Pager [phone]

## 2017-09-26 NOTE — Care Management Obs Status (Signed)
[place] NOTIFICATION   Patient Details  Name: SAMINA WEEKES MRN: [id number] Date of Birth: 1946-10-30   Medicare Observation Status Notification Given:       Adron Bene, RN 09/26/2017, 2:56 PM

## 2017-09-26 NOTE — Evaluation (Addendum)
Physical Therapy Evaluation and discharge Patient Details Name: Kathleen Guerrero MRN: [id number] DOB: 05/21/1946 Today's Date: 09/26/2017   History of Present Illness  Pt with recent admission forright upper lobe groundglass opacity, 1320 mm. Underwent right VATS and right upper lobe lobectomy, September 2018. She had a prolonged hospital stay, which was complicatedwith multiorgan failure, ventilator associated pneumonia, and metabolic encephalopathy with delirium, s/p CIR admission. Now admitted for  acute kidney injury and worsening weakness, more severe over the last 3 weeks, to the point where she is needing assistance for ambulation.  Clinical Impression  Patient presents with mild dependencies in gait and mobility, mostly due to balance.  Patient anxious to go home today.  Discussed with patient the need to use cane when out of home, and feel patient can go without cane within home.  Patient needs assistance to recover from significant challenges to balance, thus need for cane outside of home.  I would recommend OPPT to continue to address balance deficits.  Feel all needs can be met in next venue of care.      Follow Up Recommendations Outpatient PT    Equipment Recommendations  None recommended by PT    Recommendations for Other Services       Precautions / Restrictions Precautions Precautions: None      Mobility  Bed Mobility Overal bed mobility: Independent                Transfers Overall transfer level: Modified independent Equipment used: Quad cane                Ambulation/Gait Ambulation/Gait assistance: Supervision Ambulation Distance (Feet): 100 Feet Assistive device: None Gait Pattern/deviations: Step-through pattern Gait velocity: decreased   General Gait Details: patient with no apparent loss of balance during ambulation; however, when presented with perturbation slight loss of balance.  Pt did not like me giving her perturbations and  requested I stop.  I tried to explain need to test balance and she politely deferred.  Stairs            Wheelchair Mobility    Modified Rankin (Stroke Patients Only)       Balance Overall balance assessment: Needs assistance Sitting-balance support: No upper extremity supported;Feet supported Sitting balance-Leahy Scale: Normal Sitting balance - Comments: able to lean forward and don socks without loss of balance   Standing balance support: No upper extremity supported;During functional activity Standing balance-Leahy Scale: Fair                               Pertinent Vitals/Pain Pain Assessment: No/denies pain    Home Living Family/patient expects to be discharged to:: Private residence Living Arrangements: Non-relatives/Friends Available Help at Discharge: Available PRN/intermittently;Friend(s) Type of Home: Apartment(has been living with friends)         Home Equipment: Gilford Rile - 2 wheels;Cane - quad      Prior Function Level of Independence: Independent with assistive device(s)         Comments: has been ambulating with quad cane; feels she is ready to ambulate without it     Hand Dominance        Extremity/Trunk Assessment   Upper Extremity Assessment Upper Extremity Assessment: Overall WFL for tasks assessed    Lower Extremity Assessment Lower Extremity Assessment: Overall WFL for tasks assessed       Communication   Communication: No difficulties  Cognition Arousal/Alertness: Awake/alert Behavior During Therapy: [facility]  for tasks assessed/performed Overall Cognitive Status: Within Functional Limits for tasks assessed                                        General Comments      Exercises     Assessment/Plan    PT Assessment All further PT needs can be met in the next venue of care  PT Problem List Decreased activity tolerance;Decreased balance       PT Treatment Interventions      PT Goals (Current  goals can be found in the Care Plan section)  Acute Rehab PT Goals Patient Stated Goal: go home today PT Goal Formulation: All assessment and education complete, DC therapy    Frequency     Barriers to discharge        Co-evaluation               AM-PAC PT "6 Clicks" Daily Activity  Outcome Measure Difficulty turning over in bed (including adjusting bedclothes, sheets and blankets)?: None Difficulty moving from lying on back to sitting on the side of the bed? : None Difficulty sitting down on and standing up from a chair with arms (e.g., wheelchair, bedside commode, etc,.)?: None Help needed moving to and from a bed to chair (including a wheelchair)?: None Help needed walking in hospital room?: None Help needed climbing 3-5 steps with a railing? : A Little 6 Click Score: 23    End of Session Equipment Utilized During Treatment: Gait belt Activity Tolerance: Patient tolerated treatment well Patient left: in chair;with call bell/phone within reach   PT Visit Diagnosis: Unsteadiness on feet (R26.81)    Time: 1000-1019 PT Time Calculation (min) (ACUTE ONLY): 19 min   Charges:   PT Evaluation $PT Eval Moderate Complexity: 1 Mod     PT G Codes:        10/13/2017 [facility], PT [phone]    Shanna Cisco 2017/10/13, 10:31 AM

## 2017-09-26 NOTE — Progress Notes (Signed)
[facility]  Patient Status: Active (receiving services up to time of hospitalization)  [facility] is providing the following services: PT  If patient discharges after hours, please call [phone].   Kathleen Guerrero 09/26/2017, 10:32 AM

## 2017-09-27 LAB — URINE CULTURE: CULTURE: NO GROWTH

## 2017-09-29 ENCOUNTER — Telehealth: Payer: Self-pay | Admitting: Internal Medicine

## 2017-09-29 DIAGNOSIS — E785 Hyperlipidemia, unspecified: Secondary | ICD-10-CM | POA: Diagnosis not present

## 2017-09-29 DIAGNOSIS — Z483 Aftercare following surgery for neoplasm: Secondary | ICD-10-CM | POA: Diagnosis not present

## 2017-09-29 DIAGNOSIS — K219 Gastro-esophageal reflux disease without esophagitis: Secondary | ICD-10-CM | POA: Diagnosis not present

## 2017-09-29 DIAGNOSIS — R69 Illness, unspecified: Secondary | ICD-10-CM | POA: Diagnosis not present

## 2017-09-29 DIAGNOSIS — I480 Paroxysmal atrial fibrillation: Secondary | ICD-10-CM | POA: Diagnosis not present

## 2017-09-29 DIAGNOSIS — I10 Essential (primary) hypertension: Secondary | ICD-10-CM | POA: Diagnosis not present

## 2017-09-29 DIAGNOSIS — C3491 Malignant neoplasm of unspecified part of right bronchus or lung: Secondary | ICD-10-CM | POA: Diagnosis not present

## 2017-09-29 DIAGNOSIS — J939 Pneumothorax, unspecified: Secondary | ICD-10-CM | POA: Diagnosis not present

## 2017-09-29 DIAGNOSIS — D62 Acute posthemorrhagic anemia: Secondary | ICD-10-CM | POA: Diagnosis not present

## 2017-09-29 NOTE — Telephone Encounter (Signed)
Ok for verbals 

## 2017-09-29 NOTE — Telephone Encounter (Signed)
Needs verbals for PT for 2week, 1week1

## 2017-09-29 NOTE — Progress Notes (Signed)
PT Evaluation Addendum Late entry for missed G-code on 09/28/17 Based on review of documentation and goals    09/26/17 1031  PT Time Calculation  PT Start Time (ACUTE ONLY) 1000  PT Stop Time (ACUTE ONLY) 1019  PT Time Calculation (min) (ACUTE ONLY) 19 min  PT G-Codes **NOT FOR INPATIENT CLASS**  Functional Assessment Tool Used AM-PAC 6 Clicks Basic Mobility  Functional Limitation Mobility: Walking and moving around  Mobility: Walking and Moving Around Current Status (E0100) CI  Mobility: Walking and Moving Around Goal Status (F1219) CI  Mobility: Walking and Moving Around Discharge Status (X5883) CI  PT General Charges  $$ ACUTE PT VISIT 1 Visit  PT Evaluation  $PT Eval Moderate Complexity 1 Mod  09/29/2017 [place], PT [phone]

## 2017-09-29 NOTE — Telephone Encounter (Signed)
Notified Jeanene w/MD response...Kathleen Guerrero

## 2017-09-30 DIAGNOSIS — D62 Acute posthemorrhagic anemia: Secondary | ICD-10-CM | POA: Diagnosis not present

## 2017-09-30 DIAGNOSIS — J939 Pneumothorax, unspecified: Secondary | ICD-10-CM | POA: Diagnosis not present

## 2017-09-30 DIAGNOSIS — Z8673 Personal history of transient ischemic attack (TIA), and cerebral infarction without residual deficits: Secondary | ICD-10-CM | POA: Diagnosis not present

## 2017-09-30 DIAGNOSIS — I480 Paroxysmal atrial fibrillation: Secondary | ICD-10-CM | POA: Diagnosis not present

## 2017-09-30 DIAGNOSIS — K219 Gastro-esophageal reflux disease without esophagitis: Secondary | ICD-10-CM | POA: Diagnosis not present

## 2017-09-30 DIAGNOSIS — F329 Major depressive disorder, single episode, unspecified: Secondary | ICD-10-CM | POA: Diagnosis not present

## 2017-09-30 DIAGNOSIS — E785 Hyperlipidemia, unspecified: Secondary | ICD-10-CM | POA: Diagnosis not present

## 2017-09-30 DIAGNOSIS — Z483 Aftercare following surgery for neoplasm: Secondary | ICD-10-CM | POA: Diagnosis not present

## 2017-09-30 DIAGNOSIS — F17210 Nicotine dependence, cigarettes, uncomplicated: Secondary | ICD-10-CM | POA: Diagnosis not present

## 2017-09-30 DIAGNOSIS — I10 Essential (primary) hypertension: Secondary | ICD-10-CM | POA: Diagnosis not present

## 2017-09-30 DIAGNOSIS — Z95818 Presence of other cardiac implants and grafts: Secondary | ICD-10-CM | POA: Diagnosis not present

## 2017-09-30 DIAGNOSIS — C3491 Malignant neoplasm of unspecified part of right bronchus or lung: Secondary | ICD-10-CM | POA: Diagnosis not present

## 2017-09-30 DIAGNOSIS — R69 Illness, unspecified: Secondary | ICD-10-CM | POA: Diagnosis not present

## 2017-09-30 LAB — CUP PACEART REMOTE DEVICE CHECK
Implantable Pulse Generator Implant Date: 20170919
MDC IDC SESS DTM: 20181114023838

## 2017-09-30 NOTE — Telephone Encounter (Signed)
Spoke with patient, assisted her with sending a manual [facility] transmission for review.  Monitor indicates successful transmission on her end, but not yet received in [facility].  Advised patient will review transmission when received and call back if any additional recommendations.  She verbalizes understanding and agreement with plan.

## 2017-10-01 ENCOUNTER — Other Ambulatory Visit (INDEPENDENT_AMBULATORY_CARE_PROVIDER_SITE_OTHER): Payer: Medicare HMO

## 2017-10-01 ENCOUNTER — Ambulatory Visit (INDEPENDENT_AMBULATORY_CARE_PROVIDER_SITE_OTHER): Payer: Medicare HMO | Admitting: Internal Medicine

## 2017-10-01 ENCOUNTER — Encounter: Payer: Self-pay | Admitting: Internal Medicine

## 2017-10-01 VITALS — BP 136/84 | HR 108 | Temp 97.7°F | Ht 62.25 in | Wt 172.0 lb

## 2017-10-01 DIAGNOSIS — R350 Frequency of micturition: Secondary | ICD-10-CM

## 2017-10-01 DIAGNOSIS — Z0000 Encounter for general adult medical examination without abnormal findings: Secondary | ICD-10-CM | POA: Diagnosis not present

## 2017-10-01 DIAGNOSIS — R739 Hyperglycemia, unspecified: Secondary | ICD-10-CM | POA: Insufficient documentation

## 2017-10-01 DIAGNOSIS — E876 Hypokalemia: Secondary | ICD-10-CM | POA: Diagnosis not present

## 2017-10-01 DIAGNOSIS — N179 Acute kidney failure, unspecified: Secondary | ICD-10-CM | POA: Diagnosis not present

## 2017-10-01 DIAGNOSIS — F329 Major depressive disorder, single episode, unspecified: Secondary | ICD-10-CM | POA: Diagnosis not present

## 2017-10-01 DIAGNOSIS — F419 Anxiety disorder, unspecified: Secondary | ICD-10-CM

## 2017-10-01 DIAGNOSIS — N390 Urinary tract infection, site not specified: Secondary | ICD-10-CM | POA: Diagnosis not present

## 2017-10-01 DIAGNOSIS — Z1159 Encounter for screening for other viral diseases: Secondary | ICD-10-CM | POA: Diagnosis not present

## 2017-10-01 DIAGNOSIS — R69 Illness, unspecified: Secondary | ICD-10-CM | POA: Diagnosis not present

## 2017-10-01 DIAGNOSIS — F32A Depression, unspecified: Secondary | ICD-10-CM

## 2017-10-01 LAB — URINALYSIS, ROUTINE W REFLEX MICROSCOPIC
Bilirubin Urine: NEGATIVE
HGB URINE DIPSTICK: NEGATIVE
Ketones, ur: NEGATIVE
Leukocytes, UA: NEGATIVE
Nitrite: NEGATIVE
RBC / HPF: NONE SEEN (ref 0–?)
Specific Gravity, Urine: 1.01 (ref 1.000–1.030)
Total Protein, Urine: NEGATIVE
URINE GLUCOSE: NEGATIVE
Urobilinogen, UA: 0.2 (ref 0.0–1.0)
pH: 7 (ref 5.0–8.0)

## 2017-10-01 LAB — LIPID PANEL
Cholesterol: 115 mg/dL (ref 0–200)
HDL: 44.5 mg/dL (ref >39.00)
LDL Cholesterol: 46 mg/dL (ref 0–99)
NonHDL: 70.4
TRIGLYCERIDES: 120 mg/dL (ref 0.0–149.0)
Total CHOL/HDL Ratio: 3
VLDL: 24 mg/dL (ref 0.0–40.0)

## 2017-10-01 LAB — BASIC METABOLIC PANEL
BUN: 13 mg/dL (ref 6–23)
CHLORIDE: 101 meq/L (ref 96–112)
CO2: 28 meq/L (ref 19–32)
CREATININE: 1.23 mg/dL — AB (ref 0.40–1.20)
Calcium: 10.1 mg/dL (ref 8.4–10.5)
GFR: 45.94 mL/min — ABNORMAL LOW (ref >60.00)
GLUCOSE: 132 mg/dL — AB (ref 70–99)
Potassium: 4.1 mEq/L (ref 3.5–5.1)
Sodium: 138 mEq/L (ref 135–145)

## 2017-10-01 LAB — CBC WITH DIFFERENTIAL/PLATELET
BASOS PCT: 1.5 % (ref 0.0–3.0)
Basophils Absolute: 0.1 10*3/uL (ref 0.0–0.1)
EOS PCT: 0.5 % (ref 0.0–5.0)
Eosinophils Absolute: 0 10*3/uL (ref 0.0–0.7)
HCT: 33.1 % — ABNORMAL LOW (ref 36.0–46.0)
HEMOGLOBIN: 10.9 g/dL — AB (ref 12.0–15.0)
LYMPHS ABS: 1.9 10*3/uL (ref 0.7–4.0)
Lymphocytes Relative: 20.1 % (ref 12.0–46.0)
MCHC: 32.8 g/dL (ref 30.0–36.0)
MCV: 88.7 fl (ref 78.0–100.0)
MONO ABS: 0.7 10*3/uL (ref 0.1–1.0)
Monocytes Relative: 7.6 % (ref 3.0–12.0)
Neutro Abs: 6.6 10*3/uL (ref 1.4–7.7)
Neutrophils Relative %: 70.3 % (ref 43.0–77.0)
Platelets: 334 10*3/uL (ref 150.0–400.0)
RBC: 3.73 Mil/uL — ABNORMAL LOW (ref 3.87–5.11)
RDW: 17.5 % — AB (ref 11.5–15.5)
WBC: 9.4 10*3/uL (ref 4.0–10.5)

## 2017-10-01 LAB — HEPATIC FUNCTION PANEL
ALBUMIN: 3.8 g/dL (ref 3.5–5.2)
ALT: 17 U/L (ref 0–35)
AST: 17 U/L (ref 0–37)
Alkaline Phosphatase: 68 U/L (ref 39–117)
BILIRUBIN TOTAL: 0.6 mg/dL (ref 0.2–1.2)
Bilirubin, Direct: 0.1 mg/dL (ref 0.0–0.3)
Total Protein: 7.7 g/dL (ref 6.0–8.3)

## 2017-10-01 LAB — TSH: TSH: 1.83 u[IU]/mL (ref 0.35–4.50)

## 2017-10-01 LAB — HEMOGLOBIN A1C: HEMOGLOBIN A1C: 5.3 % (ref 4.6–6.5)

## 2017-10-01 NOTE — Progress Notes (Signed)
Subjective:    Patient ID: Kathleen Guerrero, female    DOB: [birth year], 71 y.o.   MRN: [id number]  HPI  Here for wellness and f/u;  Overall doing ok;  Pt denies Chest pain, worsening SOB, DOE, wheezing, orthopnea, PND, worsening LE edema, palpitations, dizziness or syncope.  Pt denies neurological change such as new headache, facial or extremity weakness.  Pt denies polydipsia, polyuria, or low sugar symptoms. Pt states overall good compliance with treatment and medications, good tolerability, and has been trying to follow appropriate diet.  Pt denies worsening depressive symptoms, suicidal ideation or panic. No fever, night sweats, wt loss, loss of appetite, or other constitutional symptoms.  Pt states good ability with ADL's, has low fall risk, home safety reviewed and adequate, no other significant changes in hearing or vision, and only occasionally active with exercise.  Wants to stop K and protonix as does not feel needed.   She recently restarted a prior med she had at home for OAB - asks for restart/refill oxybutinin 5 mg qhs as this helped. Had abnormal UA recently and reports a low grade temp of 99 about 3 days ago, ask for recheck.  Denies urinary symptoms such as dysuria, frequency, urgency, flank pain, hematuria or n/v, chills. Wt Readings from Last 3 Encounters:  10/01/17 172 lb (78 kg)  09/26/17 179 lb 14.4 oz (81.6 kg)  09/23/17 166 lb (75.3 kg)   Past Medical History:  Diagnosis Date  . Arthritis   . GERD (gastroesophageal reflux disease)    tums  . Headache   . Hepatitis    cmv 30 yrs ago hepatitis  . History of hiatal hernia   . History of loop recorder    after stroke  . Hypertension   . Stroke ([place]) 07/2016   balance a little off  . Tobacco use    Past Surgical History:  Procedure Laterality Date  . APPENDECTOMY    . DERMOID CYST  EXCISION     x2 68'  . EP IMPLANTABLE DEVICE N/A 07/23/2016   Procedure: Loop Recorder Insertion;  Surgeon: Thompson Grayer, MD;  Location:  [facility];  Service: Cardiovascular;  Laterality: N/A;  . Intestinal obstruction  1979  . LOBECTOMY Right 07/30/2017   Procedure: LOBECTOMY;  Surgeon: [name], MD;  Location: [facility];  Service: Thoracic;  Laterality: Right;  . TEE WITHOUT CARDIOVERSION N/A 07/23/2016   Procedure: TRANSESOPHAGEAL ECHOCARDIOGRAM (TEE);  Surgeon: Lelon Perla, MD;  Location: [facility];  Service: Cardiovascular;  Laterality: N/A;  . VIDEO ASSISTED THORACOSCOPY (VATS)/WEDGE RESECTION Right 07/30/2017   Procedure: VIDEO ASSISTED THORACOSCOPY (VATS)/WEDGE RESECTION;  Surgeon: [name], MD;  Location: [facility];  Service: Thoracic;  Laterality: Right;    reports that she quit smoking about 14 months ago. Her smoking use included cigarettes. She has a 50.00 pack-year smoking history. she has never used smokeless tobacco. She reports that she drinks alcohol. She reports that she does not use drugs. family history includes Cancer in her maternal grandfather; Kidney disease in her maternal grandmother. No Known Allergies Current Outpatient Medications on File Prior to Visit  Medication Sig Dispense Refill  . acetaminophen (TYLENOL) 325 MG tablet Take 650 mg by mouth daily as needed for mild pain or headache.    Marland Kitchen amiodarone (PACERONE) 200 MG tablet Take 1 tablet (200 mg total) by mouth daily. 30 tablet 0  . amLODipine (NORVASC) 10 MG tablet Take 1 tablet (10 mg total) by mouth daily. [place]  tablet 0  . aspirin 325 MG tablet TAKE 1 TABLET BY MOUTH EVERY DAY 30 tablet 11  . atorvastatin (LIPITOR) 40 MG tablet TAKE 1 TABLET BY MOUTH EVERY DAY AT 6 p.m. 90 tablet 0  . calcium carbonate (TUMS - DOSED IN MG ELEMENTAL CALCIUM) 500 MG chewable tablet Chew 1 tablet by mouth 4 (four) times daily as needed for indigestion or heartburn.    . cloNIDine (CATAPRES) 0.1 MG tablet Take 1 tablet (0.1 mg total) by mouth 2 (two) times daily. 60 tablet 11  . escitalopram (LEXAPRO) 10 MG tablet Take 1 tablet (10 mg  total) by mouth daily. 90 tablet 3  . metoprolol tartrate (LOPRESSOR) 25 MG tablet Take 0.5 tablets (12.5 mg total) by mouth 2 (two) times daily. 60 tablet 0   No current facility-administered medications on file prior to visit.    Review of Systems Constitutional: Negative for other unusual diaphoresis, sweats, appetite or weight changes HENT: Negative for other worsening hearing loss, ear pain, facial swelling, mouth sores or neck stiffness.   Eyes: Negative for other worsening pain, redness or other visual disturbance.  Respiratory: Negative for other stridor or swelling Cardiovascular: Negative for other palpitations or other chest pain  Gastrointestinal: Negative for worsening diarrhea or loose stools, blood in stool, distention or other pain Genitourinary: Negative for hematuria, flank pain or other change in urine volume.  Musculoskeletal: Negative for myalgias or other joint swelling.  Skin: Negative for other color change, or other wound or worsening drainage.  Neurological: Negative for other syncope or numbness. Hematological: Negative for other adenopathy or swelling Psychiatric/Behavioral: Negative for hallucinations, other worsening agitation, SI, self-injury, or new decreased concentration All other system neg per pt    Objective:   Physical Exam BP 136/84   Pulse (!) 108   Temp 97.7 F (36.5 C) (Oral)   Ht 5' 2.25" (1.581 m)   Wt 172 lb (78 kg)   SpO2 96%   BMI 31.21 kg/m  VS noted,  Constitutional: Pt is oriented to person, place, and time. Appears well-developed and well-nourished, in no significant distress and comfortable Head: Normocephalic and atraumatic  Eyes: Conjunctivae and EOM are normal. Pupils are equal, round, and reactive to light Right Ear: External ear normal without discharge Left Ear: External ear normal without discharge Nose: Nose without discharge or deformity Mouth/Throat: Oropharynx is without other ulcerations and moist  Neck: Normal  range of motion. Neck supple. No JVD present. No tracheal deviation present or significant neck LA or mass Cardiovascular: Normal rate, regular rhythm, normal heart sounds and intact distal pulses.   Pulmonary/Chest: WOB normal and breath sounds without rales or wheezing  Abdominal: Soft. Bowel sounds are normal. NT. No HSM  Musculoskeletal: Normal range of motion. Exhibits no edema Lymphadenopathy: Has no other cervical adenopathy.  Neurological: Pt is alert and oriented to person, place, and time. Pt has normal reflexes. No cranial nerve deficit. Motor grossly intact, Gait intact Skin: Skin is warm and dry. No rash noted or new ulcerations Psychiatric:  Has normal mood and affect. Behavior is normal without agitation No other exam findings  Lab Results  Component Value Date   HGBA1C 5.4 01/17/2017      Assessment & Plan:

## 2017-10-01 NOTE — Telephone Encounter (Signed)
Manual transmission successfully received.  Transmission shows 6 "AF" episodes from 9/30-10/1/18, ECGs appear appropriate.  See examples below.  Per notes in Epic, patient had right-sided VATS with RUL wedge resection on 07/30/17 with subsequent acute respiratory failure.  Started on IV amiodarone on 9/30 for A-fib with RVR, discharged on PO amio per D/C med list.  No further A-fib noted since episodes on 10/1.  Patient's next f/u with neurology is scheduled for 10/09/17.  Will route to Dr. Rayann Heman for recommendations.  Full transmission printed and placed in Dr. Otilio Connors folder for review.

## 2017-10-01 NOTE — Patient Instructions (Signed)
Ok to stay off the potassium pill for now  OK to stop the protonix  Please continue all other medications as before, and refills have been done if requested.  Please have the pharmacy call with any other refills you may need.  Please continue your efforts at being more active, low cholesterol diet, and weight control.  You are otherwise up to date with prevention measures today.  Please keep your appointments with your specialists as you may have planned  Please go to the LAB in the Basement (turn left off the elevator) for the tests to be done today  You will be contacted by phone if any changes need to be made immediately.  Otherwise, you will receive a letter about your results with an explanation, but please check with MyChart first.  Please remember to sign up for MyChart if you have not done so, as this will be important to you in the future with finding out test results, communicating by private email, and scheduling acute appointments online when needed.  Please return in 6 months, or sooner if needed

## 2017-10-02 DIAGNOSIS — E785 Hyperlipidemia, unspecified: Secondary | ICD-10-CM | POA: Diagnosis not present

## 2017-10-02 DIAGNOSIS — D62 Acute posthemorrhagic anemia: Secondary | ICD-10-CM | POA: Diagnosis not present

## 2017-10-02 DIAGNOSIS — J939 Pneumothorax, unspecified: Secondary | ICD-10-CM | POA: Diagnosis not present

## 2017-10-02 DIAGNOSIS — Z483 Aftercare following surgery for neoplasm: Secondary | ICD-10-CM | POA: Diagnosis not present

## 2017-10-02 DIAGNOSIS — K219 Gastro-esophageal reflux disease without esophagitis: Secondary | ICD-10-CM | POA: Diagnosis not present

## 2017-10-02 DIAGNOSIS — I480 Paroxysmal atrial fibrillation: Secondary | ICD-10-CM | POA: Diagnosis not present

## 2017-10-02 DIAGNOSIS — I10 Essential (primary) hypertension: Secondary | ICD-10-CM | POA: Diagnosis not present

## 2017-10-02 DIAGNOSIS — R69 Illness, unspecified: Secondary | ICD-10-CM | POA: Diagnosis not present

## 2017-10-02 DIAGNOSIS — C3491 Malignant neoplasm of unspecified part of right bronchus or lung: Secondary | ICD-10-CM | POA: Diagnosis not present

## 2017-10-02 LAB — URINE CULTURE
MICRO NUMBER:: 81336372
SPECIMEN QUALITY:: ADEQUATE

## 2017-10-02 LAB — HEPATITIS C ANTIBODY
Hepatitis C Ab: NONREACTIVE
SIGNAL TO CUT-OFF: 0.8 (ref <1.00)

## 2017-10-05 ENCOUNTER — Encounter: Payer: Self-pay | Admitting: Internal Medicine

## 2017-10-05 NOTE — Assessment & Plan Note (Signed)
Ok to d/c K med as she has recently, for f/u lab

## 2017-10-05 NOTE — Assessment & Plan Note (Signed)
Asympt, mild, cont diet

## 2017-10-05 NOTE — Assessment & Plan Note (Signed)
For rechck studies today

## 2017-10-05 NOTE — Assessment & Plan Note (Signed)

## 2017-10-05 NOTE — Assessment & Plan Note (Signed)
Resolved, cont to monitor

## 2017-10-05 NOTE — Assessment & Plan Note (Signed)
stable overall by history and exam, and pt to continue medical treatment as before,  to f/u any worsening symptoms or concerns 

## 2017-10-06 ENCOUNTER — Other Ambulatory Visit: Payer: Self-pay | Admitting: Thoracic Surgery (Cardiothoracic Vascular Surgery)

## 2017-10-06 DIAGNOSIS — Z902 Acquired absence of lung [part of]: Secondary | ICD-10-CM

## 2017-10-06 DIAGNOSIS — C801 Malignant (primary) neoplasm, unspecified: Secondary | ICD-10-CM

## 2017-10-06 NOTE — Telephone Encounter (Signed)
She had post operative afib observed with her VATS in September.  I will alert Dr Leonie Man, but am inclined to follow for now and defer anticoagulation until she has additional episodes of afib.

## 2017-10-06 NOTE — Telephone Encounter (Signed)
Agree unless she has had prior or new stroke/TIA

## 2017-10-07 ENCOUNTER — Ambulatory Visit (INDEPENDENT_AMBULATORY_CARE_PROVIDER_SITE_OTHER): Payer: Self-pay | Admitting: Thoracic Surgery (Cardiothoracic Vascular Surgery)

## 2017-10-07 ENCOUNTER — Other Ambulatory Visit: Payer: Self-pay

## 2017-10-07 ENCOUNTER — Ambulatory Visit
Admission: RE | Admit: 2017-10-07 | Discharge: 2017-10-07 | Disposition: A | Discharge status: Home / Self Care | Payer: Medicare HMO | Source: Ambulatory Visit | Attending: Thoracic Surgery (Cardiothoracic Vascular Surgery) | Admitting: Thoracic Surgery (Cardiothoracic Vascular Surgery)

## 2017-10-07 ENCOUNTER — Encounter: Payer: Self-pay | Admitting: Thoracic Surgery (Cardiothoracic Vascular Surgery)

## 2017-10-07 VITALS — BP 101/61 | HR 80 | Ht 62.25 in | Wt 172.0 lb

## 2017-10-07 DIAGNOSIS — R0602 Shortness of breath: Secondary | ICD-10-CM | POA: Diagnosis not present

## 2017-10-07 DIAGNOSIS — C801 Malignant (primary) neoplasm, unspecified: Secondary | ICD-10-CM

## 2017-10-07 DIAGNOSIS — C3491 Malignant neoplasm of unspecified part of right bronchus or lung: Secondary | ICD-10-CM

## 2017-10-07 DIAGNOSIS — Z902 Acquired absence of lung [part of]: Secondary | ICD-10-CM

## 2017-10-07 NOTE — Progress Notes (Signed)
[address]       [place] [address]             [phone]     HPI: Mrs. Berti returns for scheduled postoperative follow-up visit  She is a 71 year old former smoker who had a thoracoscopic right upper lobectomy for stage Ia adenocarcinoma on 07/30/2017.  Her postoperative course was complicated by delirium, acute respiratory failure requiring reintubation, pneumonia and deconditioning.  She finally got extubated after about 2 weeks and was discharged to rehab on 08/20/2017.  I last saw in the office on 09/09/2017 and she was making good progress at that time.  In the interim since her last visit she was readmitted to the hospital with dehydration and urinary tract infection.  Once that was treated she felt better and again feels like she is making progress.  She continues to work with physical therapy.  Her appetite is improving.  Past Medical History:  Diagnosis Date  . Arthritis   . GERD (gastroesophageal reflux disease)    tums  . Headache   . Hepatitis    cmv 30 yrs ago hepatitis  . History of hiatal hernia   . History of loop recorder    after stroke  . Hypertension   . Stroke ([place]) 07/2016   balance a little off  . Tobacco use     Current Outpatient Medications  Medication Sig Dispense Refill  . acetaminophen (TYLENOL) 325 MG tablet Take 650 mg by mouth daily as needed for mild pain or headache.    Marland Kitchen amiodarone (PACERONE) 200 MG tablet Take 1 tablet (200 mg total) by mouth daily. 30 tablet 0  . amLODipine (NORVASC) 10 MG tablet Take 1 tablet (10 mg total) by mouth daily. 90 tablet 0  . aspirin 325 MG tablet TAKE 1 TABLET BY MOUTH EVERY DAY 30 tablet 11  . atorvastatin (LIPITOR) 40 MG tablet TAKE 1 TABLET BY MOUTH EVERY DAY AT 6 p.m. 90 tablet 0  . calcium carbonate (TUMS - DOSED IN MG ELEMENTAL CALCIUM) 500 MG chewable tablet Chew 1 tablet by mouth 4 (four) times daily as needed for indigestion or heartburn.    . cloNIDine (CATAPRES) 0.1 MG  tablet Take 1 tablet (0.1 mg total) by mouth 2 (two) times daily. 60 tablet 11  . escitalopram (LEXAPRO) 10 MG tablet Take 1 tablet (10 mg total) by mouth daily. 90 tablet 3  . metoprolol tartrate (LOPRESSOR) 25 MG tablet Take 0.5 tablets (12.5 mg total) by mouth 2 (two) times daily. 60 tablet 0   No current facility-administered medications for this visit.     Physical Exam BP 101/61 (BP Location: Left Arm, Patient Position: Sitting, Cuff Size: Large)   Pulse 80   Ht 5' 2.25" (1.581 m)   Wt 172 lb (78 kg)   SpO2 98%   BMI 31.49 kg/m  71 year old woman in no acute distress Alert and oriented x3 with no focal deficits Lungs diminished right base otherwise clear Cardiac regular rate and rhythm normal S1-S2 Incision well-healed  Diagnostic Tests: CHEST  2 VIEW  COMPARISON:  11/24/2016 .  FINDINGS: Surgical clips right chest . Cardiac monitor noted . Mediastinum and hilar structures normal the lungs are clear. Heart size normal. No pleural effusion or pneumothorax no pleural effusion or pneumothorax.  IMPRESSION: No acute cardiopulmonary disease.   Electronically Signed   By: Marcello Moores  Register   On: 10/07/2017 11:22 I personally reviewed the chest x-ray images and concur with  the findings noted above  Impression: Mrs. Alto is a 71 year old woman who had a thoracoscopic right upper lobectomy for stage IA adenocarcinoma a little over 2 months ago.  Her postoperative course was complicated by delirium, pneumonia, and respiratory failure.  She had severe deconditioning and went to a SNF initially.  She is now at home and continues to make progress although she did have a little setback with a recent urinary tract infection.  She is scheduled to see Dr. Julien Nordmann at our multidisciplinary clinic on 10/16/2017.  Her activities are unrestricted.  She should continue to build into doing activities gradually.  Plan: Follow up with Dr. Julien Nordmann as scheduled.  I will see her  back in 4 months with a chest x-ray to check on her progress.  [name], MD [facility] [phone]

## 2017-10-08 DIAGNOSIS — E785 Hyperlipidemia, unspecified: Secondary | ICD-10-CM | POA: Diagnosis not present

## 2017-10-08 DIAGNOSIS — D62 Acute posthemorrhagic anemia: Secondary | ICD-10-CM | POA: Diagnosis not present

## 2017-10-08 DIAGNOSIS — K219 Gastro-esophageal reflux disease without esophagitis: Secondary | ICD-10-CM | POA: Diagnosis not present

## 2017-10-08 DIAGNOSIS — I480 Paroxysmal atrial fibrillation: Secondary | ICD-10-CM | POA: Diagnosis not present

## 2017-10-08 DIAGNOSIS — J939 Pneumothorax, unspecified: Secondary | ICD-10-CM | POA: Diagnosis not present

## 2017-10-08 DIAGNOSIS — Z483 Aftercare following surgery for neoplasm: Secondary | ICD-10-CM | POA: Diagnosis not present

## 2017-10-08 DIAGNOSIS — I10 Essential (primary) hypertension: Secondary | ICD-10-CM | POA: Diagnosis not present

## 2017-10-08 DIAGNOSIS — C3491 Malignant neoplasm of unspecified part of right bronchus or lung: Secondary | ICD-10-CM | POA: Diagnosis not present

## 2017-10-08 DIAGNOSIS — R69 Illness, unspecified: Secondary | ICD-10-CM | POA: Diagnosis not present

## 2017-10-08 NOTE — Progress Notes (Signed)
[facility]  PATIENT: Kathleen Guerrero DOB: [birth year]   REASON FOR VISIT:  follow-up for stroke Sept 2017 and  TIA March 2018 HISTORY FROM: Patient    HISTORY OF PRESENT ILLNESS:UPDATE 12/6/2018CM Kathleen Guerrero, 71 year old female returns for follow-up.  She has a history of right brain subcortical infarct in September 2017 etiology indeterminate lacunar versus cryptogenic given large size. Vascular risk factors of hyperlipidemia only. Readmitted for TIA in March 2018.  She remains on aspirin for secondary stroke prevention.  Blood pressure in the office today 109/65.  She has not had recurrent stroke or TIA symptoms.  She has minimal bruising and no bleeding she remains on Lipitor without complaints of myalgias.  She had lobectomy of her right lung for cancer in September.  She is currently receiving physical therapy in the home setting.  She is using a quad cane, she denies any falls she continues to complain of fatigue.  She also had an admission in November for acute kidney injury.  Loop recorder without atrial fibrillation. she is driving without difficulty.  She returns for reevaluation   (per record)Kathleen Guerrero a 71 y.o.femalewho fell asleep inside a car for approx 1-1.5h and when she woke up she started having difficulty coming up with the right words to say. This lasted for ever 30 minutes and then slowly resolved. By the time she presented to out ED all her symptoms had resolved and her NIHSS was zero. Therefore the code stroke was called off. Her only other complaint was blurred vision, which was monocular in right eye. It should be noted that pt has been very hypertensive in the last few days and that her doctor started a new medicine yesterday - pt does not remember the name of that medicine. She did not receive IV TPA due to resolution of deficits. MRI no acute intracranial infarct carotid Doppler unremarkable 2-D echo 60-65%. Loop recorder interrogation of  atrial fibrillation. LDL 30 hemoglobin A1c 5.4 04/09/17 CMShe returns to the stroke clinic today for follow-up she remains on aspirin for secondary stroke prevention without further stroke TIA symptoms she has minimal bruising no bleeding. She remains on Lipitor without myalgias. Blood pressure in the office today 127/75. She does not exercise. She quit smoking. She returns for reevaluation  10/09/16 PSMs Kathleen Guerrero is a 56 year Caucasian lady who is seen today for first office follow-up visit after hospital admission for stroke in September 2017. Kathleen Guerrero a 71 y.o.femalewent to bed sometime between 9 and 10 last night, woke in the middle the night with left leg weakness.She states that she tossed and turned, and finally came in the emergency department today after calling her friend. She states that it seems to be a stable deficits started. An MRI was performed in the emergency department which shows a subcortical stroke on the right. LKW: 9 PM 9/15 tpa given?: no, out of window. CT scan of the head showed no acute abnormality and CT angiogram showed no significant large vessel extracranial or intracranial stenosis. An incidental finding of 15 mm right upper lobe pulmonary noted was noted. CT scan of the chest was done to follow this and etiology of this is indeterminate. MRI scan of the brain subsequently showed 1.3 cm right corona radiata infarct near the motor strip. Etiology of this was indeterminate lacunar versus cryptogenic hence patient underwent transesophageal echocardiogram which showed no cardiac source of embolism. Transthoracic echo showed normal ejection fraction. Patient had loop recorder inserted and so to  fibrillation have not yet been found. Area cholesterol 95 mg percent and hemoglobin A1c was 5.7. She'll start an aspirin and Lipitor. Patient has finished physical and occupation therapy. She has quit smoking. She is tolerating aspirin well with only minor bruising. She is eating  healthy and walks 30 minutes every day and started doing yoga. She hasn't fact lost 9 pounds and is quite proud of this fact. She has seen pulmonologist [name] who did a follow-up CT scan chest which shows stable appearance of the nodule and he has recommended conservative follow-up for now. Patient's blood pressure is elevated today in office in 171/99 and she blames this on an excitement of the visit. I counseled the patient to keep track of her blood pressure regularly and discuss medication treatment when she sees her primary care physician next. REVIEW OF SYSTEMS: Full 14 system review of systems performed and notable only for those listed, all others are neg:  Constitutional: Fatigue Cardiovascular: neg Ear/Nose/Throat: neg  Skin: neg Eyes: neg Respiratory: neg Gastroitestinal: neg  Hematology/Lymphatic: neg  Endocrine: neg Musculoskeletal:neg Allergy/Immunology: neg Neurological: neg Psychiatric: neg Sleep : neg   ALLERGIES: No Known Allergies  HOME MEDICATIONS: Outpatient Medications Prior to Visit  Medication Sig Dispense Refill  . acetaminophen (TYLENOL) 325 MG tablet Take 650 mg by mouth daily as needed for mild pain or headache.    Marland Kitchen amiodarone (PACERONE) 200 MG tablet Take 1 tablet (200 mg total) by mouth daily. 30 tablet 0  . amLODipine (NORVASC) 10 MG tablet Take 1 tablet (10 mg total) by mouth daily. 90 tablet 0  . aspirin 325 MG tablet TAKE 1 TABLET BY MOUTH EVERY DAY 30 tablet 11  . atorvastatin (LIPITOR) 40 MG tablet TAKE 1 TABLET BY MOUTH EVERY DAY AT 6 p.m. 90 tablet 0  . calcium carbonate (TUMS - DOSED IN MG ELEMENTAL CALCIUM) 500 MG chewable tablet Chew 1 tablet by mouth 4 (four) times daily as needed for indigestion or heartburn.    . cloNIDine (CATAPRES) 0.1 MG tablet Take 1 tablet (0.1 mg total) by mouth 2 (two) times daily. 60 tablet 11  . escitalopram (LEXAPRO) 10 MG tablet Take 1 tablet (10 mg total) by mouth daily. 90 tablet 3  . metoprolol tartrate  (LOPRESSOR) 25 MG tablet Take 0.5 tablets (12.5 mg total) by mouth 2 (two) times daily. 60 tablet 0   No facility-administered medications prior to visit.     PAST MEDICAL HISTORY: Past Medical History:  Diagnosis Date  . Arthritis   . GERD (gastroesophageal reflux disease)    tums  . Headache   . Hepatitis    cmv 30 yrs ago hepatitis  . History of hiatal hernia   . History of loop recorder    after stroke  . Hypertension   . Stroke ([place]) 07/2016   balance a little off  . Tobacco use     PAST SURGICAL HISTORY: Past Surgical History:  Procedure Laterality Date  . APPENDECTOMY    . DERMOID CYST  EXCISION     x2 76'  . EP IMPLANTABLE DEVICE N/A 07/23/2016   Procedure: Loop Recorder Insertion;  Surgeon: Thompson Grayer, MD;  Location: [place];  Service: Cardiovascular;  Laterality: N/A;  . Intestinal obstruction  1979  . LOBECTOMY Right 07/30/2017   Procedure: LOBECTOMY;  Surgeon: [name], MD;  Location: [facility];  Service: Thoracic;  Laterality: Right;  . TEE WITHOUT CARDIOVERSION N/A 07/23/2016   Procedure: TRANSESOPHAGEAL ECHOCARDIOGRAM (TEE);  Surgeon:  Lelon Perla, MD;  Location: [facility];  Service: Cardiovascular;  Laterality: N/A;  . VIDEO ASSISTED THORACOSCOPY (VATS)/WEDGE RESECTION Right 07/30/2017   Procedure: VIDEO ASSISTED THORACOSCOPY (VATS)/WEDGE RESECTION;  Surgeon: [name], MD;  Location: [facility];  Service: Thoracic;  Laterality: Right;    FAMILY HISTORY: Family History  Problem Relation Age of Onset  . Kidney disease Maternal Grandmother   . Cancer Maternal Grandfather     SOCIAL HISTORY: Social History   Socioeconomic History  . Marital status: Divorced    Spouse name: Not on file  . Number of children: 1  . Years of education: 57  . Highest education level: Not on file  Social Needs  . Financial resource strain: Not on file  . Food insecurity - worry: Not on file  . Food insecurity - inability: Not on file    . Transportation needs - medical: Not on file  . Transportation needs - non-medical: Not on file  Occupational History  . Occupation: Retired  Tobacco Use  . Smoking status: Former Smoker    Packs/day: 1.00    Years: 50.00    Pack years: 50.00    Types: Cigarettes    Last attempt to quit: 07/20/2016    Years since quitting: 1.2  . Smokeless tobacco: Never Used  Substance and Sexual Activity  . Alcohol use: Yes    Comment: Rarely  . Drug use: No  . Sexual activity: Not on file  Other Topics Concern  . Not on file  Social History Narrative   Fun: Read   Denies abuse and feels safe at home.      PHYSICAL EXAM  Vitals:   10/09/17 1003  BP: 109/65  Pulse: 67  Weight: 166 lb (75.3 kg)  Height: 5' 2.5" (1.588 m)   Body mass index is 29.88 kg/m.  Generalized: Well developed, Obese female in no acute distress  Head: normocephalic and atraumatic,. Oropharynx benign  Neck: Supple, no carotid bruits  Cardiac: Regular rate rhythm, no murmur  Musculoskeletal: No deformity   Neurological examination   Mentation: Alert oriented to time, place, history taking. Attention span and concentration appropriate. Recent and remote memory intact.  Follows all commands speech and language fluent.   Cranial nerve II-XII: .Pupils were equal round reactive to light extraocular movements were full, visual field were full on confrontational test. Facial sensation and strength were normal. hearing was intact to finger rubbing bilaterally. Uvula tongue midline. head turning and shoulder shrug were normal and symmetric.Tongue protrusion into cheek strength was normal. Motor: normal bulk and tone, full strength in the BUE, BLE, fine finger movements normal, no pronator drift.  Sensory: normal and symmetric to light touch, , in the upper and lower extremities Coordination: finger-nose-finger, heel-to-shin bilaterally, no dysmetria Reflexes: 1+ upper lower and symmetric, plantar responses were flexor  bilaterally. Gait and Station: Rising up from seated position without assistance, normal stance,  moderate stride, good arm swing, smooth turning, able to perform tiptoe, and heel walking without difficulty. Tandem gait is  unsteady  DIAGNOSTIC DATA (LABS, IMAGING, TESTING) - I reviewed patient records, labs, notes, testing and imaging myself where available.  Lab Results  Component Value Date   WBC 9.4 10/01/2017   HGB 10.9 (L) 10/01/2017   HCT 33.1 (L) 10/01/2017   MCV 88.7 10/01/2017   PLT 334.0 10/01/2017      Component Value Date/Time   NA 138 10/01/2017 1134   K 4.1 10/01/2017 1134   CL 101 10/01/2017 1134  CO2 28 10/01/2017 1134   GLUCOSE 132 (H) 10/01/2017 1134   BUN 13 10/01/2017 1134   CREATININE 1.23 (H) 10/01/2017 1134   CALCIUM 10.1 10/01/2017 1134   PROT 7.7 10/01/2017 1134   ALBUMIN 3.8 10/01/2017 1134   AST 17 10/01/2017 1134   ALT 17 10/01/2017 1134   ALKPHOS 68 10/01/2017 1134   BILITOT 0.6 10/01/2017 1134   GFRNONAA 42 (L) 09/26/2017 0723   GFRAA 49 (L) 09/26/2017 0723   Lab Results  Component Value Date   CHOL 115 10/01/2017   HDL 44.50 10/01/2017   LDLCALC 46 10/01/2017   TRIG 120.0 10/01/2017   CHOLHDL 3 10/01/2017   Lab Results  Component Value Date   HGBA1C 5.3 10/01/2017    ASSESSMENT AND PLAN 38 year Caucasian lady with right brain subcortical infarct in September 2017 etiology indeterminate lacunar versus cryptogenic given large size. Vascular risk factors of hyperlipidemia only. Readmitted for TIA in March 2018.  Right lobectomy for cancer in September  PLAN: Stressed the importance of management of risk factors to prevent further stroke Continue Aspirin for secondary stroke prevention Maintain strict control of hypertension with blood pressure goal below 130/90, today's reading 109/65 continue antihypertensive medications Cholesterol with LDL cholesterol less than 70, followed by primary care,  continue statin drugs Lipitor Continue  physical therapy and home exercise program  eat healthy diet with whole grains,  fresh fruits and vegetables Follow up in 6 months I spent 25 minutes in total face to face time with the patient more than 50% of which was spent counseling and coordination of care, reviewing test results reviewing medications and discussing and reviewing the diagnosis of stroke/TIA and management of risk factors Dennie Bible, [facility], [facility], APRN  [facility] [address] [place], [place] [address] [phone]

## 2017-10-09 ENCOUNTER — Encounter: Payer: Self-pay | Admitting: Nurse Practitioner

## 2017-10-09 ENCOUNTER — Ambulatory Visit: Payer: Medicare HMO | Admitting: Nurse Practitioner

## 2017-10-09 VITALS — BP 109/65 | HR 67 | Ht 62.5 in | Wt 166.0 lb

## 2017-10-09 DIAGNOSIS — G459 Transient cerebral ischemic attack, unspecified: Secondary | ICD-10-CM | POA: Diagnosis not present

## 2017-10-09 DIAGNOSIS — I639 Cerebral infarction, unspecified: Secondary | ICD-10-CM

## 2017-10-09 DIAGNOSIS — R5383 Other fatigue: Secondary | ICD-10-CM

## 2017-10-09 DIAGNOSIS — I10 Essential (primary) hypertension: Secondary | ICD-10-CM

## 2017-10-09 DIAGNOSIS — E785 Hyperlipidemia, unspecified: Secondary | ICD-10-CM

## 2017-10-09 NOTE — Patient Instructions (Signed)
Continue Aspirin for secondary stroke prevention Maintain strict control of hypertension with blood pressure goal below 130/90, today's reading 109/65 continue antihypertensive medications Cholesterol with LDL cholesterol less than 70, followed by primary care,  continue statin drugs Lipitor Continue physical therapy and home exercise program  eat healthy diet with whole grains,  fresh fruits and vegetables Follow up in 6 months

## 2017-10-09 NOTE — Progress Notes (Signed)
I agree with the above plan 

## 2017-10-16 ENCOUNTER — Other Ambulatory Visit: Payer: Medicare HMO

## 2017-10-16 ENCOUNTER — Ambulatory Visit: Payer: Medicare HMO | Admitting: Internal Medicine

## 2017-10-16 ENCOUNTER — Telehealth: Payer: Self-pay | Admitting: *Deleted

## 2017-10-16 ENCOUNTER — Ambulatory Visit: Payer: Medicare HMO | Admitting: Physical Therapy

## 2017-10-16 ENCOUNTER — Encounter: Payer: Medicare HMO | Admitting: *Deleted

## 2017-10-16 ENCOUNTER — Other Ambulatory Visit: Payer: Self-pay | Admitting: Internal Medicine

## 2017-10-16 NOTE — Telephone Encounter (Signed)
Oncology Nurse Navigator Documentation  Oncology Nurse Navigator Flowsheets 10/16/2017  Navigator Location [facility]  Navigator Encounter Type Telephone/I called patient to update her on her appt. She states she would like to post pone appt. I gave her another appt.   Telephone Outgoing Call  Barriers/Navigation Needs Coordination of Care  Interventions Coordination of Care  Coordination of Care Appts  Acuity Level 1  Time Spent with Patient 15

## 2017-11-05 ENCOUNTER — Telehealth: Payer: Self-pay | Admitting: Internal Medicine

## 2017-11-05 NOTE — Telephone Encounter (Signed)
LVM about arrival time for 11/06/17 [facility] appointment

## 2017-11-06 ENCOUNTER — Other Ambulatory Visit: Payer: Medicare HMO

## 2017-11-06 ENCOUNTER — Ambulatory Visit: Payer: Medicare HMO | Admitting: Internal Medicine

## 2017-11-17 ENCOUNTER — Ambulatory Visit (INDEPENDENT_AMBULATORY_CARE_PROVIDER_SITE_OTHER): Payer: Medicare HMO | Admitting: *Deleted

## 2017-11-17 DIAGNOSIS — I639 Cerebral infarction, unspecified: Secondary | ICD-10-CM

## 2017-11-18 NOTE — Progress Notes (Signed)
[facility] Summary Report / Loop Recorder 

## 2017-12-05 LAB — CUP PACEART REMOTE DEVICE CHECK
Date Time Interrogation Session: 20190113033934
MDC IDC PG IMPLANT DT: 20170919

## 2017-12-06 ENCOUNTER — Other Ambulatory Visit: Payer: Self-pay | Admitting: Internal Medicine

## 2017-12-06 MED ORDER — OXYBUTYNIN CHLORIDE ER 5 MG PO TB24: 5.0000 mg | ORAL_TABLET | Freq: Every day | ORAL | 2 refills | Status: DC

## 2017-12-08 ENCOUNTER — Telehealth: Payer: Self-pay | Admitting: *Deleted

## 2017-12-08 DIAGNOSIS — C801 Malignant (primary) neoplasm, unspecified: Secondary | ICD-10-CM

## 2017-12-08 NOTE — Telephone Encounter (Signed)
Oncology Nurse Navigator Documentation  Oncology Nurse Navigator Flowsheets 12/08/2017  Navigator Location [facility]  Navigator Encounter Type Telephone/I called Ms. Collums to re-schedule. I spoke with patient and updated her on appt with Dr. Julien Nordmann on 12/29/17 at 1:45 labs and 2:15 with Dr. Julien Nordmann. She verbalized understanding of appt time and place.   Telephone Outgoing Call  Treatment Phase Other  Barriers/Navigation Needs Coordination of Care;Education  Education Other  Interventions Education;Coordination of Care  Acuity Level 1  Time Spent with Patient 15

## 2017-12-10 ENCOUNTER — Telehealth: Payer: Self-pay

## 2017-12-10 MED ORDER — ASPIRIN 325 MG PO TABS: 325.0000 mg | ORAL_TABLET | Freq: Every day | ORAL | 11 refills | Status: AC

## 2017-12-10 NOTE — Telephone Encounter (Signed)
rx refill

## 2017-12-15 ENCOUNTER — Ambulatory Visit (INDEPENDENT_AMBULATORY_CARE_PROVIDER_SITE_OTHER): Payer: Medicare HMO | Admitting: *Deleted

## 2017-12-15 DIAGNOSIS — I639 Cerebral infarction, unspecified: Secondary | ICD-10-CM

## 2017-12-16 NOTE — Progress Notes (Signed)
[facility] Summary Report / Loop Recorder 

## 2017-12-29 ENCOUNTER — Encounter: Payer: Self-pay | Admitting: Internal Medicine

## 2017-12-29 ENCOUNTER — Telehealth: Payer: Self-pay | Admitting: Internal Medicine

## 2017-12-29 ENCOUNTER — Inpatient Hospital Stay (HOSPITAL_BASED_OUTPATIENT_CLINIC_OR_DEPARTMENT_OTHER): Payer: Medicare HMO | Admitting: Internal Medicine

## 2017-12-29 ENCOUNTER — Inpatient Hospital Stay: Payer: Medicare HMO | Attending: Internal Medicine

## 2017-12-29 DIAGNOSIS — C3490 Malignant neoplasm of unspecified part of unspecified bronchus or lung: Secondary | ICD-10-CM

## 2017-12-29 DIAGNOSIS — C3491 Malignant neoplasm of unspecified part of right bronchus or lung: Secondary | ICD-10-CM | POA: Insufficient documentation

## 2017-12-29 DIAGNOSIS — Z87891 Personal history of nicotine dependence: Secondary | ICD-10-CM | POA: Insufficient documentation

## 2017-12-29 DIAGNOSIS — C3411 Malignant neoplasm of upper lobe, right bronchus or lung: Secondary | ICD-10-CM | POA: Diagnosis not present

## 2017-12-29 DIAGNOSIS — I10 Essential (primary) hypertension: Secondary | ICD-10-CM | POA: Diagnosis not present

## 2017-12-29 DIAGNOSIS — M1990 Unspecified osteoarthritis, unspecified site: Secondary | ICD-10-CM | POA: Diagnosis not present

## 2017-12-29 DIAGNOSIS — Z8673 Personal history of transient ischemic attack (TIA), and cerebral infarction without residual deficits: Secondary | ICD-10-CM

## 2017-12-29 DIAGNOSIS — K219 Gastro-esophageal reflux disease without esophagitis: Secondary | ICD-10-CM

## 2017-12-29 DIAGNOSIS — C801 Malignant (primary) neoplasm, unspecified: Secondary | ICD-10-CM

## 2017-12-29 LAB — CBC WITH DIFFERENTIAL (CANCER CENTER ONLY)
Basophils Absolute: 0.1 10*3/uL (ref 0.0–0.1)
Basophils Relative: 1 %
EOS ABS: 0.1 10*3/uL (ref 0.0–0.5)
Eosinophils Relative: 1 %
HCT: 34.1 % — ABNORMAL LOW (ref 34.8–46.6)
Hemoglobin: 11.4 g/dL — ABNORMAL LOW (ref 11.6–15.9)
LYMPHS ABS: 2.6 10*3/uL (ref 0.9–3.3)
Lymphocytes Relative: 31 %
MCH: 29.2 pg (ref 25.1–34.0)
MCHC: 33.3 g/dL (ref 31.5–36.0)
MCV: 87.5 fL (ref 79.5–101.0)
MONOS PCT: 7 %
Monocytes Absolute: 0.6 10*3/uL (ref 0.1–0.9)
Neutro Abs: 5.1 10*3/uL (ref 1.5–6.5)
Neutrophils Relative %: 60 %
Platelet Count: 246 10*3/uL (ref 145–400)
RBC: 3.9 MIL/uL (ref 3.70–5.45)
RDW: 15.6 % — AB (ref 11.2–14.5)
WBC Count: 8.5 10*3/uL (ref 3.9–10.3)

## 2017-12-29 LAB — CMP (CANCER CENTER ONLY)
ALT: 16 U/L (ref 0–55)
AST: 14 U/L (ref 5–34)
Albumin: 3.6 g/dL (ref 3.5–5.0)
Alkaline Phosphatase: 57 U/L (ref 40–150)
Anion gap: 10 (ref 3–11)
BUN: 16 mg/dL (ref 7–26)
CHLORIDE: 104 mmol/L (ref 98–109)
CO2: 27 mmol/L (ref 22–29)
CREATININE: 1.1 mg/dL (ref 0.60–1.10)
Calcium: 10.1 mg/dL (ref 8.4–10.4)
GFR, EST NON AFRICAN AMERICAN: 50 mL/min — AB (ref >60)
GFR, Est AFR Am: 58 mL/min — ABNORMAL LOW (ref >60)
Glucose, Bld: 103 mg/dL (ref 70–140)
Potassium: 3.8 mmol/L (ref 3.5–5.1)
Sodium: 141 mmol/L (ref 136–145)
Total Bilirubin: 0.4 mg/dL (ref 0.2–1.2)
Total Protein: 7.4 g/dL (ref 6.4–8.3)

## 2017-12-29 NOTE — Progress Notes (Signed)
[facility] Telephone:([phone]   Fax:([phone]  CONSULT NOTE  REFERRING PHYSICIAN: Dr. Modesto Charon.  REASON FOR CONSULTATION:  72 years old white female recently diagnosed with lung cancer.  HPI Kathleen Guerrero is a 72 y.o. female with past medical history significant for osteoarthritis, GERD, hypertension, hepatitis, stroke in 2017 as well as long history of smoking.  The patient was found during workup for her stroke in September 2017 to have a nodule in the right upper lobe.  The patient was followed by regular imaging studies and CT scan of the chest on January 02, 2017 showed a stable 1.4 cm groundglass right upper lobe nodule.  Repeat CT scan of the chest on 07/09/2017 showed progression of the known solid right upper lobe pulmonary nodule which measured 2.0 cm.  The patient was referred to Dr. Roxan Hockey. On July 30, 2017 she underwent a right VATS with right upper lobectomy and lymph node dissection under the care of Dr. Roxan Hockey.  The final pathology ([id number]) showed well-differentiated adenocarcinoma, grade 1 measuring 1.7 cm.  There was no evidence for lymphovascular invasion or visceral pleural invasion.  The dissected lymph nodes were negative for malignancy. The patient had a complicated postoperative course secondary to her anesthesia.  She spent around 4 weeks in the intensive care unit before going home. She is feeling much better today and recovering well from her surgery.  She denied having any chest pain, shortness of breath but continues to have mild cough with no hemoptysis.  She lost around 30 pounds after her surgery but she is gaining her weight back she denied having any nausea, vomiting, diarrhea or constipation. Family history significant for mother died at age 54 from old age, the patient does not know her father's medical history.  She has a maternal grandfather diagnosed with cancer. She is single and has 1 daughter.  She used  to work in [employer] and currently doing some part-time Press photographer.  She has a history of smoking up to 2 pack/day for around 50 years and quit in 2017.  She has no history of alcohol or drug abuse.  HPI  Past Medical History:  Diagnosis Date  . Arthritis   . GERD (gastroesophageal reflux disease)    tums  . Headache   . Hepatitis    cmv 30 yrs ago hepatitis  . History of hiatal hernia   . History of loop recorder    after stroke  . Hypertension   . Stroke ([place]) 07/2016   balance a little off  . Tobacco use     Past Surgical History:  Procedure Laterality Date  . APPENDECTOMY    . DERMOID CYST  EXCISION     x2 62'  . EP IMPLANTABLE DEVICE N/A 07/23/2016   Procedure: Loop Recorder Insertion;  Surgeon: Thompson Grayer, MD;  Location: [facility];  Service: Cardiovascular;  Laterality: N/A;  . Intestinal obstruction  1979  . LOBECTOMY Right 07/30/2017   Procedure: LOBECTOMY;  Surgeon: [name], MD;  Location: [facility];  Service: Thoracic;  Laterality: Right;  . TEE WITHOUT CARDIOVERSION N/A 07/23/2016   Procedure: TRANSESOPHAGEAL ECHOCARDIOGRAM (TEE);  Surgeon: Lelon Perla, MD;  Location: [facility];  Service: Cardiovascular;  Laterality: N/A;  . VIDEO ASSISTED THORACOSCOPY (VATS)/WEDGE RESECTION Right 07/30/2017   Procedure: VIDEO ASSISTED THORACOSCOPY (VATS)/WEDGE RESECTION;  Surgeon: [name], MD;  Location: [facility];  Service: Thoracic;  Laterality: Right;    Family History  Problem Relation Age of Onset  . Kidney disease Maternal Grandmother   . Cancer Maternal Grandfather     Social History Social History   Tobacco Use  . Smoking status: Former Smoker    Packs/day: 1.00    Years: 50.00    Pack years: 50.00    Types: Cigarettes    Last attempt to quit: 07/20/2016    Years since quitting: 1.4  . Smokeless tobacco: Never Used  Substance Use Topics  . Alcohol use: Yes    Comment: Rarely  . Drug use: No    No Known  Allergies  Current Outpatient Medications  Medication Sig Dispense Refill  . acetaminophen (TYLENOL) 325 MG tablet Take 650 mg by mouth daily as needed for mild pain or headache.    Marland Kitchen amiodarone (PACERONE) 200 MG tablet Take 1 tablet (200 mg total) by mouth daily. 30 tablet 0  . amLODipine (NORVASC) 10 MG tablet Take 1 tablet (10 mg total) by mouth daily. 90 tablet 0  . aspirin 325 MG tablet Take 1 tablet (325 mg total) by mouth daily. 30 tablet 11  . atorvastatin (LIPITOR) 40 MG tablet TAKE 1 TABLET BY MOUTH EVERY DAY AT 6 p.m. 90 tablet 0  . calcium carbonate (TUMS - DOSED IN MG ELEMENTAL CALCIUM) 500 MG chewable tablet Chew 1 tablet by mouth 4 (four) times daily as needed for indigestion or heartburn.    . cloNIDine (CATAPRES) 0.1 MG tablet Take 1 tablet (0.1 mg total) by mouth 2 (two) times daily. 60 tablet 11  . metoprolol tartrate (LOPRESSOR) 25 MG tablet Take 0.5 tablets (12.5 mg total) by mouth 2 (two) times daily. 60 tablet 0  . oxybutynin (DITROPAN-XL) 5 MG 24 hr tablet Take 1 tablet (5 mg total) by mouth at bedtime. 90 tablet 2   No current facility-administered medications for this visit.     Review of Systems  Constitutional: negative Eyes: negative Ears, nose, mouth, throat, and face: negative Respiratory: positive for cough Cardiovascular: negative Gastrointestinal: negative Genitourinary:negative Integument/breast: negative Hematologic/lymphatic: negative Musculoskeletal:negative Neurological: negative Behavioral/Psych: negative Endocrine: negative Allergic/Immunologic: negative  Physical Exam  RWE:RXVQM, healthy, no distress, well nourished, well developed and anxious SKIN: skin color, texture, turgor are normal, no rashes or significant lesions HEAD: Normocephalic, No masses, lesions, tenderness or abnormalities EYES: normal, PERRLA, Conjunctiva are pink and non-injected EARS: External ears normal, Canals clear OROPHARYNX:no exudate, no erythema and lips,  buccal mucosa, and tongue normal  NECK: supple, no adenopathy, no JVD LYMPH:  no palpable lymphadenopathy, no hepatosplenomegaly BREAST:not examined LUNGS: clear to auscultation , and palpation HEART: regular rate & rhythm, no murmurs and no gallops ABDOMEN:abdomen soft, non-tender, normal bowel sounds and no masses or organomegaly BACK: Back symmetric, no curvature., No CVA tenderness EXTREMITIES:no joint deformities, effusion, or inflammation, no edema, no skin discoloration  NEURO: alert & oriented x 3 with fluent speech, no focal motor/sensory deficits  PERFORMANCE STATUS: ECOG 1  LABORATORY DATA: Lab Results  Component Value Date   WBC 8.5 12/29/2017   HGB 10.9 (L) 10/01/2017   HCT 34.1 (L) 12/29/2017   MCV 87.5 12/29/2017   PLT 246 12/29/2017      Chemistry      Component Value Date/Time   NA 138 10/01/2017 1134   K 4.1 10/01/2017 1134   CL 101 10/01/2017 1134   CO2 28 10/01/2017 1134   BUN 13 10/01/2017 1134   CREATININE 1.23 (H) 10/01/2017 1134      Component Value Date/Time   CALCIUM 10.1  10/01/2017 1134   ALKPHOS 68 10/01/2017 1134   AST 17 10/01/2017 1134   ALT 17 10/01/2017 1134   BILITOT 0.6 10/01/2017 1134       RADIOGRAPHIC STUDIES: No results found.  ASSESSMENT: This is a very pleasant 72 years old white female recently diagnosed with a stage IA (T1b, N0, M0) non-small cell lung cancer, adenocarcinoma presented with right upper lobe pulmonary nodule status post right upper lobectomy with lymph node dissection under the care of Dr. Roxan Hockey on July 30, 2017.   PLAN: I had a lengthy discussion with the patient today about her current disease stage, prognosis and treatment options.  I explained to the patient that the 5 years overall survival for patient with a stage Ia is around 80%. I also explained to the patient that there is no survival benefit for adjuvant systemic chemotherapy for patient with a stage Ia non-small cell lung cancer. I  recommended for the patient to continue on observation with repeat CT scan of the chest in 6 months.  We will do the scan every 6 months for the first 2 years followed by annual scan for a total of 5 years. The patient was advised to call immediately if she has any concerning symptoms in the interval. The patient voices understanding of current disease status and treatment options and is in agreement with the current care plan.  All questions were answered. The patient knows to call the clinic with any problems, questions or concerns. We can certainly see the patient much sooner if necessary.  Thank you so much for allowing me to participate in the care of Kathleen Guerrero. I will continue to follow up the patient with you and assist in her care.  I spent 40 minutes counseling the patient face to face. The total time spent in the appointment was 60 minutes.  Disclaimer: This note was dictated with voice recognition software. Similar sounding words can inadvertently be transcribed and may not be corrected upon review.   Eilleen Kempf December 29, 2017, 2:33 PM

## 2017-12-29 NOTE — Telephone Encounter (Signed)
Appointments scheduled AVS/Calendar printed per 2/25 los °

## 2018-01-07 LAB — CUP PACEART REMOTE DEVICE CHECK
Implantable Pulse Generator Implant Date: 20170919
MDC IDC SESS DTM: 20190212064114

## 2018-01-19 ENCOUNTER — Ambulatory Visit (INDEPENDENT_AMBULATORY_CARE_PROVIDER_SITE_OTHER): Payer: Medicare HMO | Admitting: *Deleted

## 2018-01-19 DIAGNOSIS — I639 Cerebral infarction, unspecified: Secondary | ICD-10-CM

## 2018-01-19 NOTE — Progress Notes (Signed)
[facility] Summary Report / Loop Recorder 

## 2018-02-02 ENCOUNTER — Other Ambulatory Visit: Payer: Self-pay | Admitting: Thoracic Surgery (Cardiothoracic Vascular Surgery)

## 2018-02-02 DIAGNOSIS — C3491 Malignant neoplasm of unspecified part of right bronchus or lung: Secondary | ICD-10-CM

## 2018-02-03 ENCOUNTER — Ambulatory Visit: Payer: Medicare HMO | Admitting: Thoracic Surgery (Cardiothoracic Vascular Surgery)

## 2018-02-03 ENCOUNTER — Encounter: Payer: Self-pay | Admitting: Thoracic Surgery (Cardiothoracic Vascular Surgery)

## 2018-02-03 ENCOUNTER — Ambulatory Visit
Admission: RE | Admit: 2018-02-03 | Discharge: 2018-02-03 | Disposition: A | Discharge status: Home / Self Care | Payer: Medicare HMO | Source: Ambulatory Visit | Attending: Thoracic Surgery (Cardiothoracic Vascular Surgery) | Admitting: Thoracic Surgery (Cardiothoracic Vascular Surgery)

## 2018-02-03 VITALS — BP 140/83 | HR 86 | Resp 20 | Ht 62.5 in | Wt 173.0 lb

## 2018-02-03 DIAGNOSIS — Z902 Acquired absence of lung [part of]: Secondary | ICD-10-CM | POA: Diagnosis not present

## 2018-02-03 DIAGNOSIS — C3491 Malignant neoplasm of unspecified part of right bronchus or lung: Secondary | ICD-10-CM

## 2018-02-03 NOTE — Progress Notes (Signed)
[address]       [place] [address]             [phone]     HPI: Mrs. Gorey returns for a scheduled follow up visit  Mrs. Iacovelli is a 72 yo woman who had a thoracoscopic right upper lobectomy for a stage IA non-small cell carcinoma in September 2018.  She had a complicated postoperative course with severe delirium and acute respiratory failure.  She was treated for presumptive pneumonia.  She was on the ventilator for about 2 weeks before she was able to be extubated.  She went to rehab briefly after discharge but has been back at home for several months now.  She saw Dr. Julien Nordmann in February.  He will see her back in August or September with a CT of the chest.  She feels well.  She does not think she is back to 100% but she is able to be back at work.  She is not having any pain.  She is very concerned about the possibility of having to have surgery again.  Past Medical History:  Diagnosis Date  . Arthritis   . GERD (gastroesophageal reflux disease)    tums  . Headache   . Hepatitis    cmv 30 yrs ago hepatitis  . History of hiatal hernia   . History of loop recorder    after stroke  . Hypertension   . Stroke ([place]) 07/2016   balance a little off  . Tobacco use     Current Outpatient Medications  Medication Sig Dispense Refill  . acetaminophen (TYLENOL) 325 MG tablet Take 650 mg by mouth daily as needed for mild pain or headache.    Marland Kitchen amiodarone (PACERONE) 200 MG tablet Take 1 tablet (200 mg total) by mouth daily. 30 tablet 0  . amLODipine (NORVASC) 10 MG tablet Take 1 tablet (10 mg total) by mouth daily. 90 tablet 0  . aspirin 325 MG tablet Take 1 tablet (325 mg total) by mouth daily. 30 tablet 11  . atorvastatin (LIPITOR) 40 MG tablet TAKE 1 TABLET BY MOUTH EVERY DAY AT 6 p.m. 90 tablet 0  . calcium carbonate (TUMS - DOSED IN MG ELEMENTAL CALCIUM) 500 MG chewable tablet Chew 1 tablet by mouth 4 (four) times daily as needed for indigestion or  heartburn.    . cloNIDine (CATAPRES) 0.1 MG tablet Take 1 tablet (0.1 mg total) by mouth 2 (two) times daily. 60 tablet 11  . metoprolol tartrate (LOPRESSOR) 25 MG tablet Take 0.5 tablets (12.5 mg total) by mouth 2 (two) times daily. 60 tablet 0  . oxybutynin (DITROPAN-XL) 5 MG 24 hr tablet Take 1 tablet (5 mg total) by mouth at bedtime. 90 tablet 2   No current facility-administered medications for this visit.     Physical Exam BP 140/83   Pulse 86   Resp 20   Ht 5' 2.5" (1.588 m)   Wt 173 lb (78.5 kg)   SpO2 98% Comment: RA  BMI 31.38 kg/m  72 year old woman in no acute distress Alert and oriented x3 with no focal deficits No cervical or subclavicular adenopathy Lungs diminished at right base, otherwise clear Cardiac regular rate and rhythm  Diagnostic Tests: CHEST - 2 VIEW  COMPARISON:  10/07/2017 and earlier.  FINDINGS: Anterior left chest wall cardiac event recorder re-demonstrated. Stable volume loss in the right lung with staple lines in the peripheral right upper lung and along the right mediastinum.  Mediastinal contours are stable. No pneumothorax, pulmonary edema, pleural effusion or confluent pulmonary opacity. Stable visualized osseous structures. Negative visible bowel gas pattern.  IMPRESSION: Continued stable post treatment appearance of the chest. No acute cardiopulmonary abnormality.   Electronically Signed   By: Genevie Ann M.D.   On: 02/03/2018 10:29 I personally reviewed the chest x-ray images and concur with the findings noted above  Impression: Mrs. Milling is a 72 year old woman who had a right upper lobectomy for a stage IA adenocarcinoma 6 months ago.  She had a comp gated postoperative course.  The primary problem was delirium, which was severe.  She also had acute respiratory failure and probable aspiration pneumonia related to the delirium.  She had a prolonged hospitalization but was discharged after about a month.  She initially went to  rehab but has since gone back home.  She is doing well at this point time.  She does not have any incisional pain.  She does not feel like she is 100% recovered from the surgery, but is able to be back at work..  She saw Dr. Julien Nordmann.  He did not recommend any adjuvant therapy.  She sees him again and will have a CT scan in September.  She is very concerned about the possibility of having a similar event should she require surgery in the future.  There really is no way we can rule that out but obviously she should discuss that with anesthesia ahead of any procedure, so the precautions can be taken.  Plan: I will see her back in about 6 months after she has had her CT scan and has seen Dr. Julien Nordmann.  [name], MD [facility] [phone]

## 2018-02-11 ENCOUNTER — Telehealth: Payer: Self-pay | Admitting: Internal Medicine

## 2018-02-11 MED ORDER — METOPROLOL TARTRATE 25 MG PO TABS: 12.5000 mg | ORAL_TABLET | Freq: Two times a day (BID) | ORAL | 1 refills | Status: DC

## 2018-02-11 NOTE — Telephone Encounter (Signed)
Reviewed chart pt is up-to-date sent refills to [facility].../lmb  

## 2018-02-11 NOTE — Telephone Encounter (Signed)
Refill  Request metoprolol Last Filled  08/27/2017  By  Lauraine Rinne   Physical rehab    LOV Dr Jenny Reichmann  10/01/2017 Pharmacy  [facility]  [address] Letta Kocher

## 2018-02-11 NOTE — Telephone Encounter (Signed)
Copied from [place] [phone]. Topic: General - Other >> Feb 11, 2018 12:11 PM Oneta Rack wrote: Relation to pt: self  Call back number: [phone] Pharmacy: [facility], [place] - [place], [place] - [address] [phone] (Phone) [phone] (Fax)  Reason for call:  Patient requesting metoprolol tartrate (LOPRESSOR) 25 MG tablet, patient stated pharmacy sent in request last week and will run out tomorrow, please advise >> Feb 11, 2018 12:20 PM Oneta Rack wrote: Relation to pt: self  Call back number: [phone] Pharmacy: [facility], [place] - [place], [place] - [address] [phone] (Phone) [phone] (Fax)  Reason for call:  Patient requesting metoprolol tartrate (LOPRESSOR) 25 MG tablet, patient stated pharmacy sent in request last week and will run out tomorrow, please advise

## 2018-02-19 ENCOUNTER — Ambulatory Visit (INDEPENDENT_AMBULATORY_CARE_PROVIDER_SITE_OTHER): Payer: Medicare HMO | Admitting: *Deleted

## 2018-02-19 DIAGNOSIS — I639 Cerebral infarction, unspecified: Secondary | ICD-10-CM | POA: Diagnosis not present

## 2018-02-20 NOTE — Progress Notes (Signed)
[facility] Summary Report / Loop Recorder 

## 2018-02-24 LAB — CUP PACEART REMOTE DEVICE CHECK
Implantable Pulse Generator Implant Date: 20170919
MDC IDC SESS DTM: 20190317093944

## 2018-03-01 IMAGING — CR DG CHEST 2V
2 series · 2 of 2 positions shown · non-contrast
Comparison: Chest x-ray of 08/19/2017

CLINICAL DATA: Post VATS on the right on 07/30/2017, followup

EXAM:
CHEST  2 VIEW

[w chest pa]
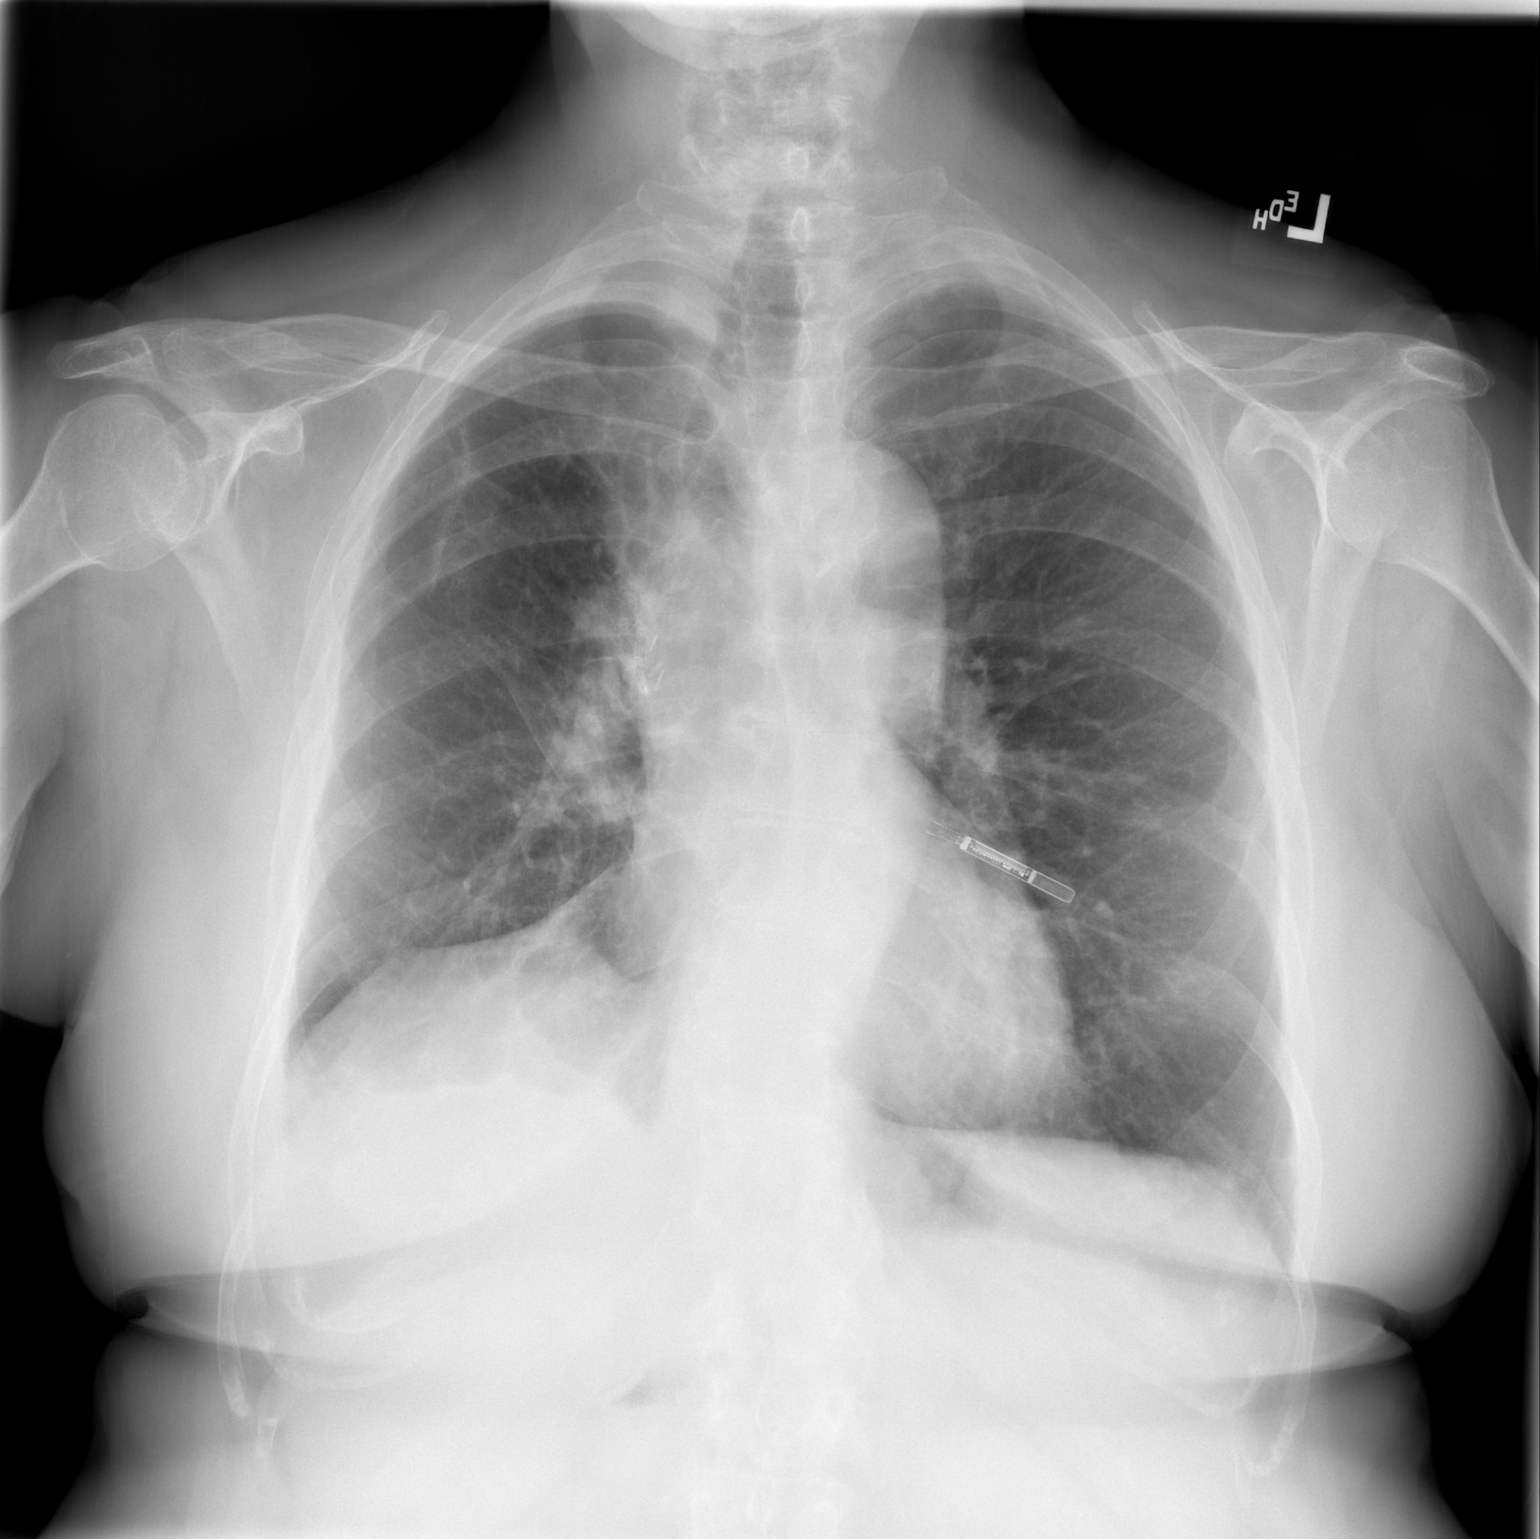

[w chest lat]
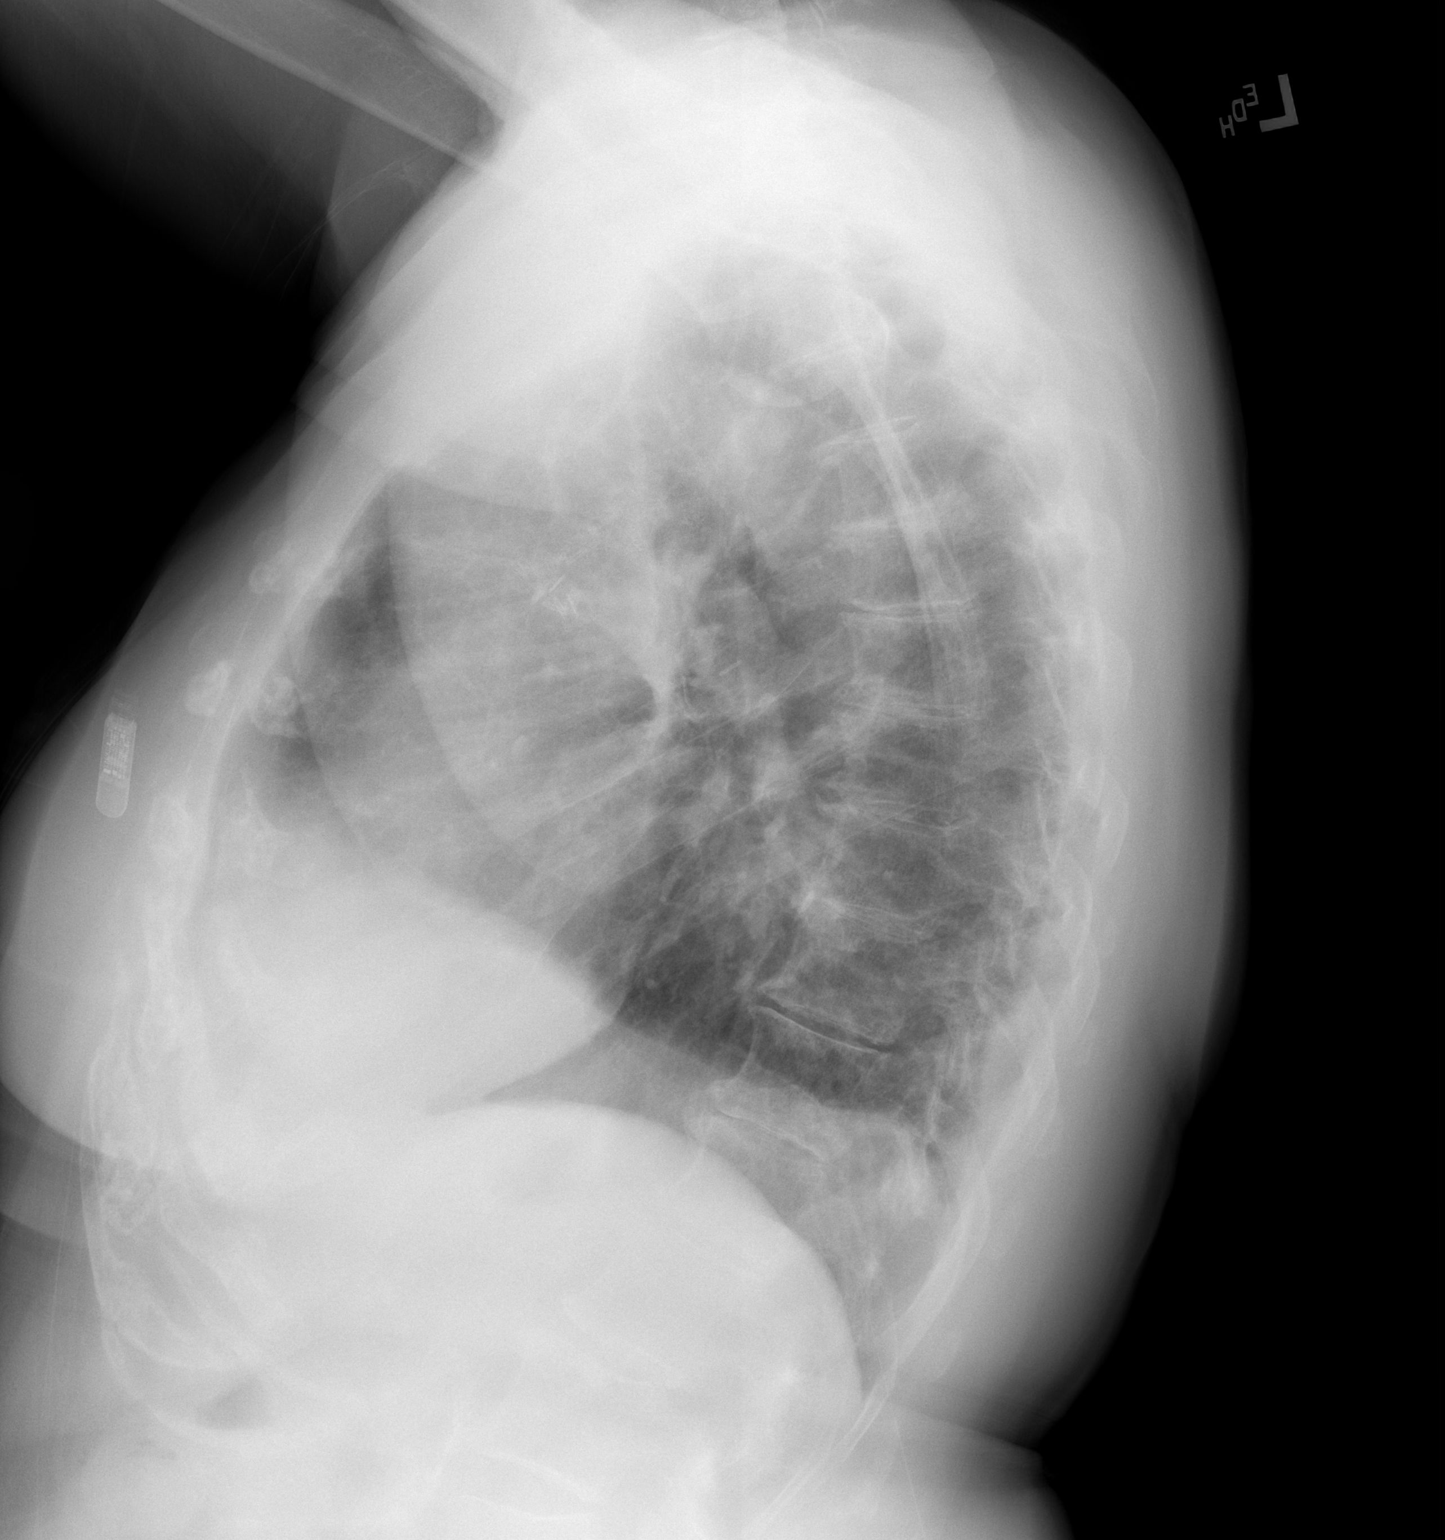

[2 of 2 positions shown; findings below may reference images not displayed]

FINDINGS: No active infiltrate or effusion is seen. There is some volume loss
on the right and surgical sutures and clips overlie the right hilum.
The heart is within normal limits in size. No acute bony abnormality
is seen. Loop recorder again is noted implanted in the soft tissues
of the anterior left chest.
IMPRESSION: No active lung disease.

## 2018-03-09 ENCOUNTER — Other Ambulatory Visit: Payer: Self-pay | Admitting: Internal Medicine

## 2018-03-09 ENCOUNTER — Ambulatory Visit (INDEPENDENT_AMBULATORY_CARE_PROVIDER_SITE_OTHER): Payer: Medicare HMO | Admitting: Internal Medicine

## 2018-03-09 ENCOUNTER — Other Ambulatory Visit (INDEPENDENT_AMBULATORY_CARE_PROVIDER_SITE_OTHER): Payer: Medicare HMO

## 2018-03-09 ENCOUNTER — Encounter: Payer: Self-pay | Admitting: Internal Medicine

## 2018-03-09 VITALS — BP 126/82 | HR 77 | Temp 99.0°F | Ht 62.5 in | Wt 174.0 lb

## 2018-03-09 DIAGNOSIS — R739 Hyperglycemia, unspecified: Secondary | ICD-10-CM

## 2018-03-09 DIAGNOSIS — I10 Essential (primary) hypertension: Secondary | ICD-10-CM

## 2018-03-09 DIAGNOSIS — E785 Hyperlipidemia, unspecified: Secondary | ICD-10-CM | POA: Diagnosis not present

## 2018-03-09 DIAGNOSIS — L989 Disorder of the skin and subcutaneous tissue, unspecified: Secondary | ICD-10-CM | POA: Insufficient documentation

## 2018-03-09 DIAGNOSIS — L309 Dermatitis, unspecified: Secondary | ICD-10-CM | POA: Diagnosis not present

## 2018-03-09 LAB — BASIC METABOLIC PANEL
BUN: 16 mg/dL (ref 6–23)
CALCIUM: 9.9 mg/dL (ref 8.4–10.5)
CO2: 29 mEq/L (ref 19–32)
CREATININE: 1.04 mg/dL (ref 0.40–1.20)
Chloride: 104 mEq/L (ref 96–112)
GFR: 55.68 mL/min — AB (ref >60.00)
Glucose, Bld: 96 mg/dL (ref 70–99)
POTASSIUM: 3.5 meq/L (ref 3.5–5.1)
Sodium: 142 mEq/L (ref 135–145)

## 2018-03-09 NOTE — Assessment & Plan Note (Signed)
Lab Results  Component Value Date   HGBA1C 5.3 10/01/2017   stable overall by history and exam, recent data reviewed with pt, and pt to continue medical treatment as before,  to f/u any worsening symptoms or concerns

## 2018-03-09 NOTE — Assessment & Plan Note (Signed)
stable overall by history and exam, recent data reviewed with pt, and pt to continue medical treatment as before,  to f/u any worsening symptoms or concerns BP Readings from Last 3 Encounters:  03/09/18 126/82  02/03/18 140/83  12/29/17 (!) 152/67

## 2018-03-09 NOTE — Progress Notes (Signed)
Subjective:    Patient ID: Kathleen Guerrero, female    DOB: 12-15-1945, 72 y.o.   MRN: [id number]  HPI  Here to f/u; overall doing ok,  Pt denies chest pain, increasing sob or doe, wheezing, orthopnea, PND, increased LE swelling, palpitations, dizziness or syncope.  Pt denies new neurological symptoms such as new headache, or facial or extremity weakness or numbness.  Pt denies polydipsia, polyuria, or low sugar episode.  Pt states overall good compliance with meds, mostly trying to follow appropriate diet, with wt overall stable,  but little exercise however. Has post head rather large raised lesion with central ulceration but adamant she does not want derm referral, Dr Allyson Sabal for now, even though also has mild eczema flare to extremities as well in last 4 wks Past Medical History:  Diagnosis Date  . Arthritis   . GERD (gastroesophageal reflux disease)    tums  . Headache   . Hepatitis    cmv 30 yrs ago hepatitis  . History of hiatal hernia   . History of loop recorder    after stroke  . Hypertension   . Stroke ([place]) 07/2016   balance a little off  . Tobacco use    Past Surgical History:  Procedure Laterality Date  . APPENDECTOMY    . DERMOID CYST  EXCISION     x2 71'  . EP IMPLANTABLE DEVICE N/A 07/23/2016   Procedure: Loop Recorder Insertion;  Surgeon: Thompson Grayer, MD;  Location: [facility];  Service: Cardiovascular;  Laterality: N/A;  . Intestinal obstruction  1979  . LOBECTOMY Right 07/30/2017   Procedure: LOBECTOMY;  Surgeon: [name], MD;  Location: [facility];  Service: Thoracic;  Laterality: Right;  . TEE WITHOUT CARDIOVERSION N/A 07/23/2016   Procedure: TRANSESOPHAGEAL ECHOCARDIOGRAM (TEE);  Surgeon: Lelon Perla, MD;  Location[facility];  Service: Cardiovascular;  Laterality: N/A;  . VIDEO ASSISTED THORACOSCOPY (VATS)/WEDGE RESECTION Right 07/30/2017   Procedure: VIDEO ASSISTED THORACOSCOPY (VATS)/WEDGE RESECTION;  Surgeon: [name], MD;   Location: [facility];  Service: Thoracic;  Laterality: Right;    reports that she quit smoking about 19 months ago. Her smoking use included cigarettes. She has a 50.00 pack-year smoking history. She has never used smokeless tobacco. She reports that she drinks alcohol. She reports that she does not use drugs. family history includes Cancer in her maternal grandfather; Kidney disease in her maternal grandmother. No Known Allergies Current Outpatient Medications on File Prior to Visit  Medication Sig Dispense Refill  . acetaminophen (TYLENOL) 325 MG tablet Take 650 mg by mouth daily as needed for mild pain or headache.    Marland Kitchen amiodarone (PACERONE) 200 MG tablet Take 1 tablet (200 mg total) by mouth daily. 30 tablet 0  . amLODipine (NORVASC) 10 MG tablet Take 1 tablet (10 mg total) by mouth daily. 90 tablet 0  . aspirin 325 MG tablet Take 1 tablet (325 mg total) by mouth daily. 30 tablet 11  . calcium carbonate (TUMS - DOSED IN MG ELEMENTAL CALCIUM) 500 MG chewable tablet Chew 1 tablet by mouth 4 (four) times daily as needed for indigestion or heartburn.    . cloNIDine (CATAPRES) 0.1 MG tablet Take 1 tablet (0.1 mg total) by mouth 2 (two) times daily. 60 tablet 11  . metoprolol tartrate (LOPRESSOR) 25 MG tablet Take 0.5 tablets (12.5 mg total) by mouth 2 (two) times daily. Follow-up appt due in May must see provider for future refills 60 tablet 1  .  oxybutynin (DITROPAN-XL) 5 MG 24 hr tablet Take 1 tablet (5 mg total) by mouth at bedtime. 90 tablet 2   No current facility-administered medications on file prior to visit.    Review of Systems  Constitutional: Negative for other unusual diaphoresis or sweats HENT: Negative for ear discharge or swelling Eyes: Negative for other worsening visual disturbances Respiratory: Negative for stridor or other swelling  Gastrointestinal: Negative for worsening distension or other blood Genitourinary: Negative for retention or other urinary change Musculoskeletal:  Negative for other MSK pain or swelling Skin: Negative for color change or other new lesions Neurological: Negative for worsening tremors and other numbness  Psychiatric/Behavioral: Negative for worsening agitation or other fatigue All other system neg per pt    Objective:   Physical Exam BP 126/82   Pulse 77   Temp 99 F (37.2 C) (Oral)   Ht 5' 2.5" (1.588 m)   Wt 174 lb (78.9 kg)   SpO2 96%   BMI 31.32 kg/m  VS noted,  Constitutional: Pt appears in NAD HENT: Head: NCAT.  Right Ear: External ear normal.  Left Ear: External ear normal.  Eyes: . Pupils are equal, round, and reactive to light. Conjunctivae and EOM are normal Nose: without d/c or deformity Neck: Neck supple. Gross normal ROM Cardiovascular: Normal rate and regular rhythm.   Pulmonary/Chest: Effort normal and breath sounds without rales or wheezing.  Abd:  Soft, NT, ND, + BS, no organomegaly Neurological: Pt is alert. At baseline orientation, motor grossly intact Skin: Skin is warm. + eczema like rashes to several areas of extremities, no other new lesions except for large post head near crown > 1 cm raised fleshy lesion with central ulceration,, no LE edema Psychiatric: Pt behavior is normal without agitation    Lab Results  Component Value Date   WBC 8.5 12/29/2017   HGB 11.4 (L) 12/29/2017   HCT 34.1 (L) 12/29/2017   PLT 246 12/29/2017   GLUCOSE 103 12/29/2017   CHOL 115 10/01/2017   TRIG 120.0 10/01/2017   HDL 44.50 10/01/2017   LDLCALC 46 10/01/2017   ALT 16 12/29/2017   AST 14 12/29/2017   NA 141 12/29/2017   K 3.8 12/29/2017   CL 104 12/29/2017   CREATININE 1.10 12/29/2017   BUN 16 12/29/2017   CO2 27 12/29/2017   TSH 1.83 10/01/2017   INR 0.99 07/28/2017   HGBA1C 5.3 10/01/2017       Assessment & Plan:

## 2018-03-09 NOTE — Patient Instructions (Addendum)
Please call if you change your mind about seeing Dermatology  Please continue all other medications as before, and refills have been done if requested.  Please have the pharmacy call with any other refills you may need.  Please continue your efforts at being more active, low cholesterol diet, and weight control.  Please keep your appointments with your specialists as you may have planned  Please go to the LAB in the Basement (turn left off the elevator) for the tests to be done today  You will be contacted by phone if any changes need to be made immediately.  Otherwise, you will receive a letter about your results with an explanation, but please check with MyChart first.  Please remember to sign up for MyChart if you have not done so, as this will be important to you in the future with finding out test results, communicating by private email, and scheduling acute appointments online when needed.  Please return in 6 months, or sooner if needed, with Lab testing done 3-5 days before

## 2018-03-09 NOTE — Assessment & Plan Note (Signed)
I suspect c/w possible basal cell ca, but she refuses derm referral for now

## 2018-03-09 NOTE — Assessment & Plan Note (Signed)
stable overall by history and exam, recent data reviewed with pt, and pt to continue medical treatment as before,  to f/u any worsening symptoms or concerns Lab Results  Component Value Date   LDLCALC 46 10/01/2017

## 2018-03-09 NOTE — Assessment & Plan Note (Signed)
For topical steroid tx, to f/u any worsening symptoms or concerns

## 2018-03-16 IMAGING — DX DG CHEST 2V
2 series · 2 of 2 positions shown · non-contrast
Comparison: 09/09/2017

CLINICAL DATA: Weakness

EXAM:
CHEST  2 VIEW

[chest lat]
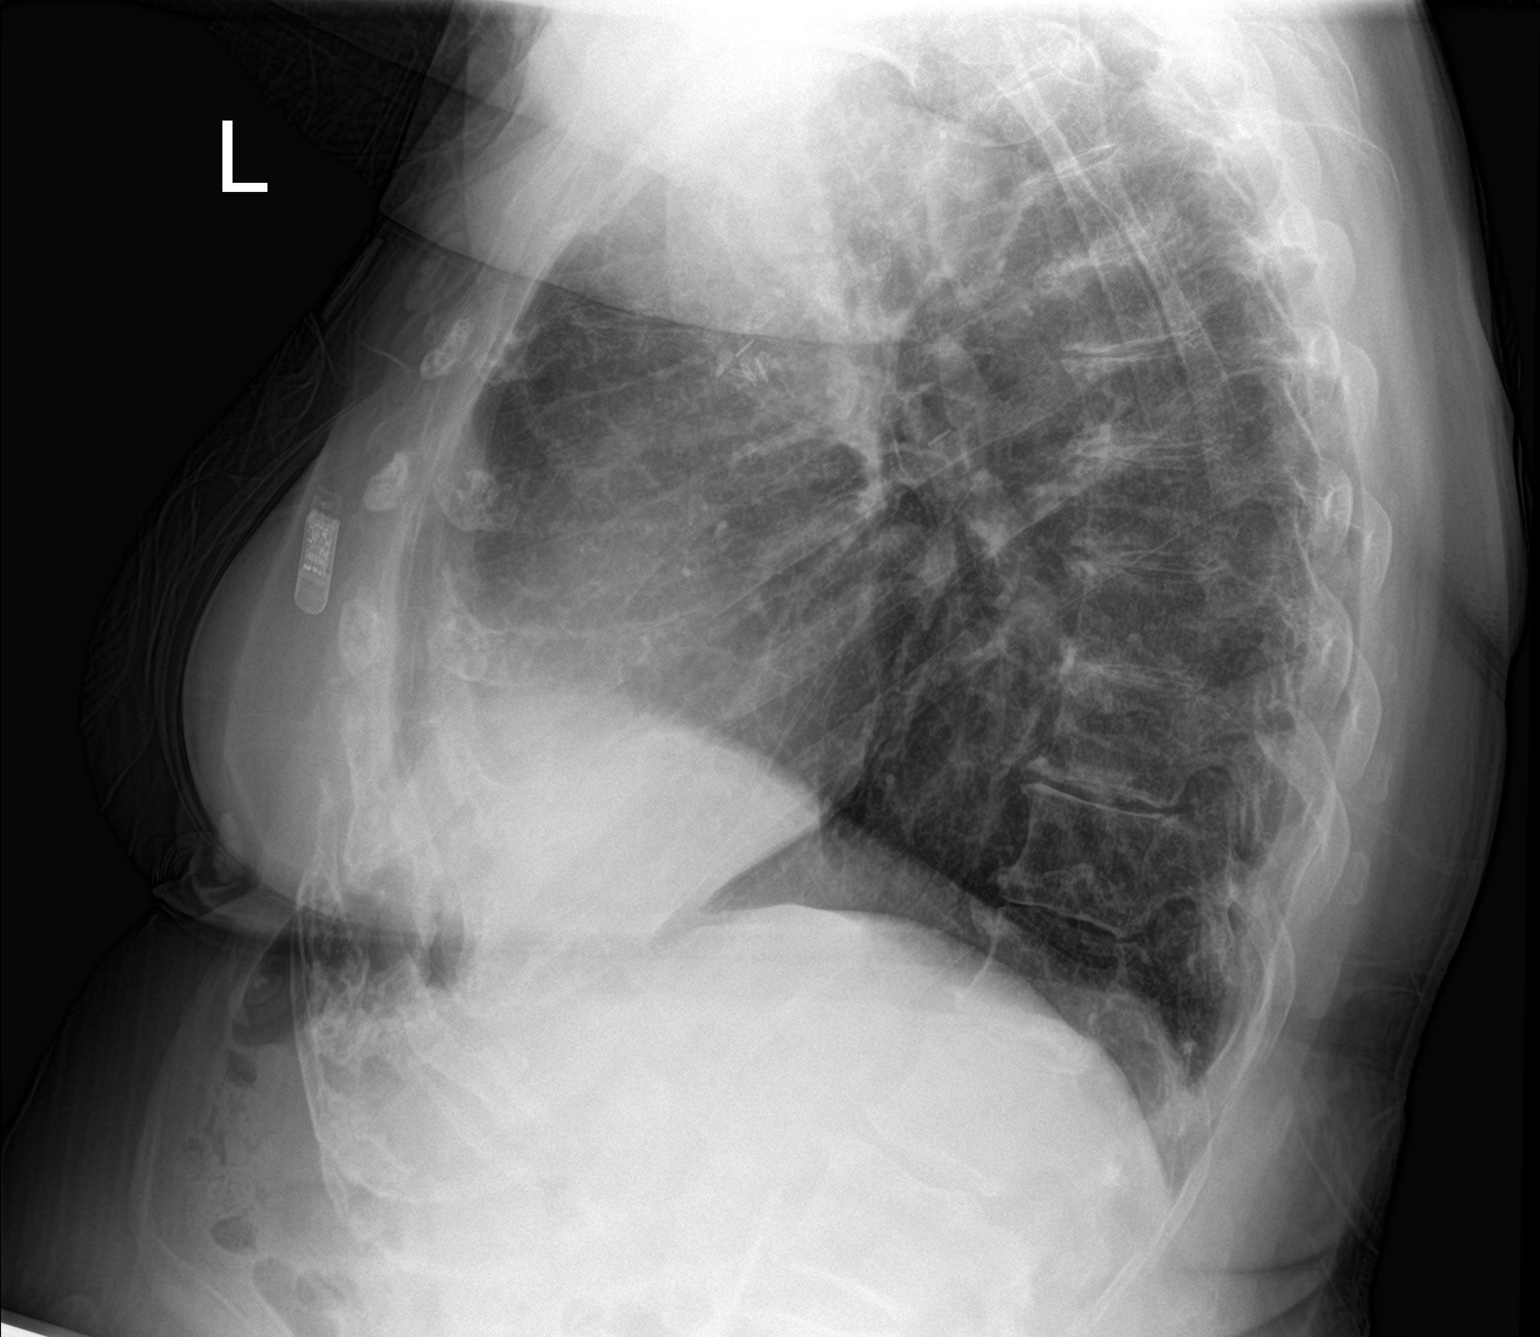

[chest ap]
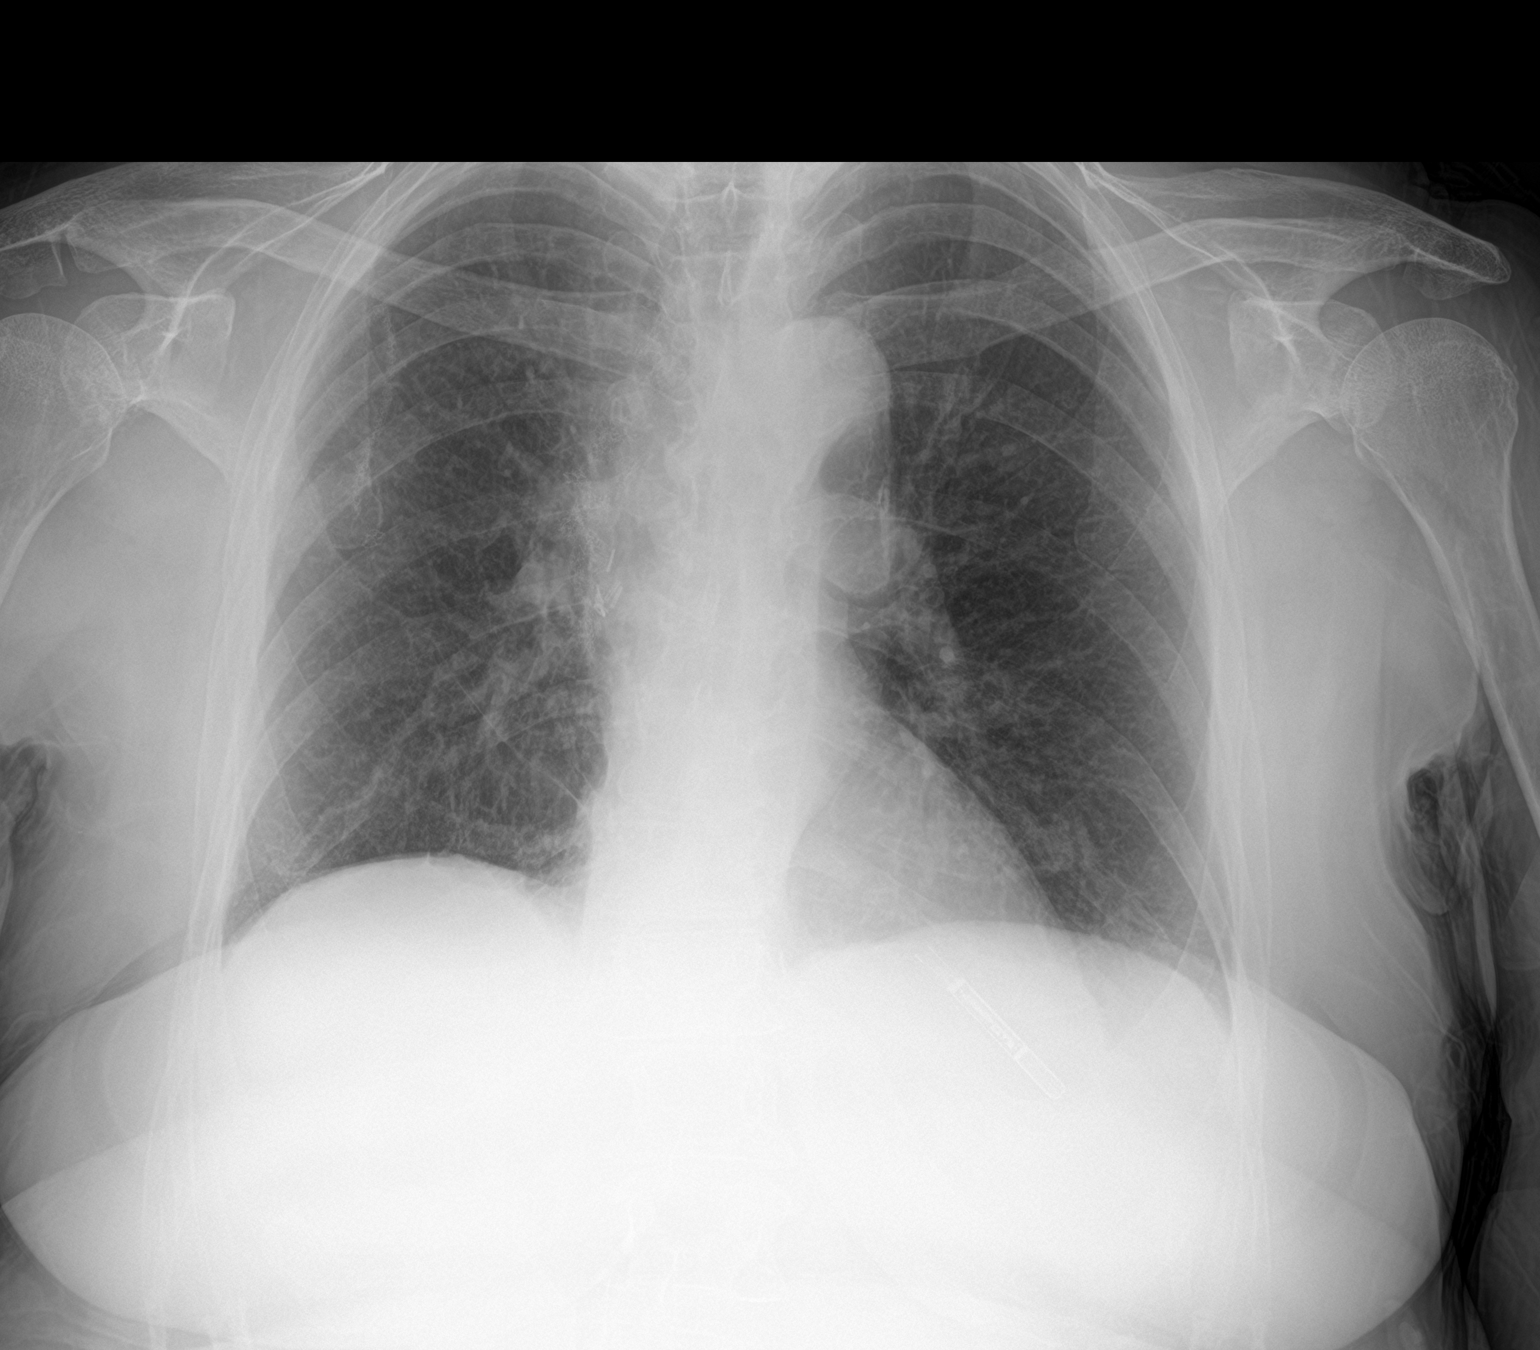

[2 of 2 positions shown; findings below may reference images not displayed]

FINDINGS: Cardiac shadow is within normal limits. Loop recorder is again
noted. The lungs are well aerated bilaterally. Postsurgical changes
are noted in the lateral right upper lobe. No sizable infiltrate or
effusion is seen. Degenerative changes of thoracic spine are noted.
IMPRESSION: Postsurgical change without acute abnormality.

## 2018-03-20 LAB — CUP PACEART REMOTE DEVICE CHECK
Implantable Pulse Generator Implant Date: 20170919
MDC IDC SESS DTM: 20190419093836

## 2018-03-24 ENCOUNTER — Ambulatory Visit (INDEPENDENT_AMBULATORY_CARE_PROVIDER_SITE_OTHER): Payer: Medicare HMO | Admitting: *Deleted

## 2018-03-24 DIAGNOSIS — I639 Cerebral infarction, unspecified: Secondary | ICD-10-CM | POA: Diagnosis not present

## 2018-03-25 NOTE — Progress Notes (Signed)
[facility] Summary Report / Loop Recorder 

## 2018-04-08 NOTE — Progress Notes (Signed)
[facility]  PATIENT: Kathleen Guerrero DOB: 10/21/1946   REASON FOR VISIT:  follow-up for stroke Sept 2017 and  TIA March 2018 HISTORY FROM: Patient    HISTORY OF PRESENT ILLNESS:UPDATE 6/6/2019CM Kathleen Guerrero, 72 year old female returns for follow-up with history of stroke in September 2017 TIA event March 2018.  She is currently on aspirin 325 secondary stroke prevention without recurrent stroke or TIA symptoms.  She has minimal bruising she is also on Lipitor without myalgias blood pressure in the office today 136/81.  Loop recorder so far no atrial fibrillation.  She currently has a bad cold but typically walks for exercise on a daily basis.  She had lobectomy of the right lung for cancer in September 2018.  She says I have not fully recovered from that but she is getting better.  She is driving without difficulty she is independent in all activities of daily living.  She returns for reevaluation  UPDATE 12/6/2018CM Kathleen. Sarrazin, 72 year old female returns for follow-up.  She has a history of right brain subcortical infarct in September 2017 etiology indeterminate lacunar versus cryptogenic given large size. Vascular risk factors of hyperlipidemia only. Readmitted for TIA in March 2018.  She remains on aspirin for secondary stroke prevention.  Blood pressure in the office today 109/65.  She has not had recurrent stroke or TIA symptoms.  She has minimal bruising and no bleeding she remains on Lipitor without complaints of myalgias.  She had lobectomy of her right lung for cancer in September.  She is currently receiving physical therapy in the home setting.  She is using a quad cane, she denies any falls she continues to complain of fatigue.  She also had an admission in November for acute kidney injury.  Loop recorder without atrial fibrillation. she is driving without difficulty.  She returns for reevaluation   (per record)Kathleen Guerrero a 72 y.o.femalewho fell asleep inside  a car for approx 1-1.5h and when she woke up she started having difficulty coming up with the right words to say. This lasted for ever 30 minutes and then slowly resolved. By the time she presented to out ED all her symptoms had resolved and her NIHSS was zero. Therefore the code stroke was called off. Her only other complaint was blurred vision, which was monocular in right eye. It should be noted that pt has been very hypertensive in the last few days and that her doctor started a new medicine yesterday - pt does not remember the name of that medicine. She did not receive IV TPA due to resolution of deficits. MRI no acute intracranial infarct carotid Doppler unremarkable 2-D echo 60-65%. Loop recorder interrogation of atrial fibrillation. LDL 30 hemoglobin A1c 5.4 04/09/17 CMShe returns to the stroke clinic today for follow-up she remains on aspirin for secondary stroke prevention without further stroke TIA symptoms she has minimal bruising no bleeding. She remains on Lipitor without myalgias. Blood pressure in the office today 127/75. She does not exercise. She quit smoking. She returns for reevaluation  10/09/16 PSMs Kathleen Guerrero is a 23 year Caucasian lady who is seen today for first office follow-up visit after hospital admission for stroke in September 2017. Kathleen Guerrero a 72 y.o.femalewent to bed sometime between 9 and 10 last night, woke in the middle the night with left leg weakness.She states that she tossed and turned, and finally came in the emergency department today after calling her friend. She states that it seems to be a  stable deficits started. An MRI was performed in the emergency department which shows a subcortical stroke on the right. LKW: 9 PM 9/15 tpa given?: no, out of window. CT scan of the head showed no acute abnormality and CT angiogram showed no significant large vessel extracranial or intracranial stenosis. An incidental finding of 15 mm right upper lobe pulmonary noted was  noted. CT scan of the chest was done to follow this and etiology of this is indeterminate. MRI scan of the brain subsequently showed 1.3 cm right corona radiata infarct near the motor strip. Etiology of this was indeterminate lacunar versus cryptogenic hence patient underwent transesophageal echocardiogram which showed no cardiac source of embolism. Transthoracic echo showed normal ejection fraction. Patient had loop recorder inserted and so to fibrillation have not yet been found. Area cholesterol 95 mg percent and hemoglobin A1c was 5.7. She'll start an aspirin and Lipitor. Patient has finished physical and occupation therapy. She has quit smoking. She is tolerating aspirin well with only minor bruising. She is eating healthy and walks 30 minutes every day and started doing yoga. She hasn't fact lost 9 pounds and is quite proud of this fact. She has seen pulmonologist Dr. Shyrl Guerrero who did a follow-up CT scan chest which shows stable appearance of the nodule and he has recommended conservative follow-up for now. Patient's blood pressure is elevated today in office in 171/99 and she blames this on an excitement of the visit. I counseled the patient to keep track of her blood pressure regularly and discuss medication treatment when she sees her primary care physician next. REVIEW OF SYSTEMS: Full 14 system review of systems performed and notable only for those listed, all others are neg:  Constitutional: neg Cardiovascular: neg Ear/Nose/Throat: neg  Skin: neg Eyes: neg Respiratory: neg Gastroitestinal: neg  Hematology/Lymphatic: neg  Endocrine: neg Musculoskeletal:neg Allergy/Immunology: neg Neurological: neg Psychiatric: neg Sleep : neg   ALLERGIES: No Known Allergies  HOME MEDICATIONS: Outpatient Medications Prior to Visit  Medication Sig Dispense Refill  . acetaminophen (TYLENOL) 325 MG tablet Take 650 mg by mouth daily as needed for mild pain or headache.    Marland Kitchen amiodarone (PACERONE) 200 MG  tablet Take 1 tablet (200 mg total) by mouth daily. 30 tablet 0  . amLODipine (NORVASC) 10 MG tablet Take 1 tablet (10 mg total) by mouth daily. 90 tablet 0  . aspirin 325 MG tablet Take 1 tablet (325 mg total) by mouth daily. 30 tablet 11  . atorvastatin (LIPITOR) 40 MG tablet TAKE 1 TABLET BY MOUTH EVERY DAY AT 6 IN THE EVENING 90 tablet 1  . calcium carbonate (TUMS - DOSED IN MG ELEMENTAL CALCIUM) 500 MG chewable tablet Chew 1 tablet by mouth 4 (four) times daily as needed for indigestion or heartburn.    . cloNIDine (CATAPRES) 0.1 MG tablet Take 1 tablet (0.1 mg total) by mouth 2 (two) times daily. 60 tablet 11  . metoprolol tartrate (LOPRESSOR) 25 MG tablet Take 0.5 tablets (12.5 mg total) by mouth 2 (two) times daily. Follow-up appt due in May must see provider for future refills 60 tablet 1  . oxybutynin (DITROPAN-XL) 5 MG 24 hr tablet Take 1 tablet (5 mg total) by mouth at bedtime. 90 tablet 2   No facility-administered medications prior to visit.     PAST MEDICAL HISTORY: Past Medical History:  Diagnosis Date  . Arthritis   . GERD (gastroesophageal reflux disease)    tums  . Headache   . Hepatitis    cmv  30 yrs ago hepatitis  . History of hiatal hernia   . History of loop recorder    after stroke  . Hypertension   . Stroke ([place]) 07/2016   balance a little off  . Tobacco use     PAST SURGICAL HISTORY: Past Surgical History:  Procedure Laterality Date  . APPENDECTOMY    . DERMOID CYST  EXCISION     x2 44'  . EP IMPLANTABLE DEVICE N/A 07/23/2016   Procedure: Loop Recorder Insertion;  Surgeon: Thompson Grayer, MD;  Location: [facility];  Service: Cardiovascular;  Laterality: N/A;  . Intestinal obstruction  1979  . LOBECTOMY Right 07/30/2017   Procedure: LOBECTOMY;  Surgeon: [name], MD;  Location: [facility];  Service: Thoracic;  Laterality: Right;  . TEE WITHOUT CARDIOVERSION N/A 07/23/2016   Procedure: TRANSESOPHAGEAL ECHOCARDIOGRAM (TEE);  Surgeon: Lelon Perla, MD;  Location[facility] ENDOSCOPY;  Service: Cardiovascular;  Laterality: N/A;  . VIDEO ASSISTED THORACOSCOPY (VATS)/WEDGE RESECTION Right 07/30/2017   Procedure: VIDEO ASSISTED THORACOSCOPY (VATS)/WEDGE RESECTION;  Surgeon: [name], MD;  Location: [facility] OR;  Service: Thoracic;  Laterality: Right;    FAMILY HISTORY: Family History  Problem Relation Age of Onset  . Kidney disease Maternal Grandmother   . Cancer Maternal Grandfather     SOCIAL HISTORY: Social History   Socioeconomic History  . Marital status: Divorced    Spouse name: Not on file  . Number of children: 1  . Years of education: 13  . Highest education level: Not on file  Occupational History  . Occupation: Retired  Scientific laboratory technician  . Financial resource strain: Not on file  . Food insecurity:    Worry: Not on file    Inability: Not on file  . Transportation needs:    Medical: Not on file    Non-medical: Not on file  Tobacco Use  . Smoking status: Former Smoker    Packs/day: 1.00    Years: 50.00    Pack years: 50.00    Types: Cigarettes    Last attempt to quit: 07/20/2016    Years since quitting: 1.7  . Smokeless tobacco: Never Used  Substance and Sexual Activity  . Alcohol use: Yes    Comment: Rarely  . Drug use: No  . Sexual activity: Not on file  Lifestyle  . Physical activity:    Days per week: Not on file    Minutes per session: Not on file  . Stress: Not on file  Relationships  . Social connections:    Talks on phone: Not on file    Gets together: Not on file    Attends religious service: Not on file    Active member of club or organization: Not on file    Attends meetings of clubs or organizations: Not on file    Relationship status: Not on file  . Intimate partner violence:    Fear of current or ex partner: Not on file    Emotionally abused: Not on file    Physically abused: Not on file    Forced sexual activity: Not on file  Other Topics Concern  . Not on file  Social  History Narrative   Fun: Read   Denies abuse and feels safe at home.      PHYSICAL EXAM  Vitals:   04/09/18 0958  BP: 136/81  Pulse: 98  Weight: 173 lb 3.2 oz (78.6 kg)  Height: 5' 2.5" (1.588 m)   Body mass index is  31.17 kg/m.  Generalized: Well developed, Obese female in no acute distress  Head: normocephalic and atraumatic,. Oropharynx benign  Neck: Supple, no carotid bruits  Cardiac: Regular rate rhythm, no murmur  Musculoskeletal: No deformity   Neurological examination   Mentation: Alert oriented to time, place, history taking. Attention span and concentration appropriate. Recent and remote memory intact.  Follows all commands speech and language fluent.   Cranial nerve II-XII: .Pupils were equal round reactive to light extraocular movements were full, visual field were full on confrontational test. Facial sensation and strength were normal. hearing was intact to finger rubbing bilaterally. Uvula tongue midline. head turning and shoulder shrug were normal and symmetric.Tongue protrusion into cheek strength was normal. Motor: normal bulk and tone, full strength in the BUE, BLE, fine finger movements normal, no pronator drift.  Sensory: normal and symmetric to light touch, , in the upper and lower extremities Coordination: finger-nose-finger, heel-to-shin bilaterally, no dysmetria Reflexes: 1+ upper lower and symmetric, plantar responses were flexor bilaterally. Gait and Station: Rising up from seated position without assistance, normal stance,  moderate stride, good arm swing, smooth turning, able to perform tiptoe, and heel walking without difficulty. Tandem gait is  steady .  No assistive device DIAGNOSTIC DATA (LABS, IMAGING, TESTING) - I reviewed patient records, labs, notes, testing and imaging myself where available.  Lab Results  Component Value Date   WBC 8.5 12/29/2017   HGB 11.4 (L) 12/29/2017   HCT 34.1 (L) 12/29/2017   MCV 87.5 12/29/2017   PLT 246  12/29/2017      Component Value Date/Time   NA 142 03/09/2018 1108   K 3.5 03/09/2018 1108   CL 104 03/09/2018 1108   CO2 29 03/09/2018 1108   GLUCOSE 96 03/09/2018 1108   BUN 16 03/09/2018 1108   CREATININE 1.04 03/09/2018 1108   CREATININE 1.10 12/29/2017 1359   CALCIUM 9.9 03/09/2018 1108   PROT 7.4 12/29/2017 1359   ALBUMIN 3.6 12/29/2017 1359   AST 14 12/29/2017 1359   ALT 16 12/29/2017 1359   ALKPHOS 57 12/29/2017 1359   BILITOT 0.4 12/29/2017 1359   GFRNONAA 50 (L) 12/29/2017 1359   GFRAA 58 (L) 12/29/2017 1359   Lab Results  Component Value Date   CHOL 115 10/01/2017   HDL 44.50 10/01/2017   LDLCALC 46 10/01/2017   TRIG 120.0 10/01/2017   CHOLHDL 3 10/01/2017   Lab Results  Component Value Date   HGBA1C 5.3 10/01/2017    ASSESSMENT AND PLAN 28 year Caucasian lady with right brain subcortical infarct in September 2017 etiology indeterminate lacunar versus cryptogenic given large size. Vascular risk factors of hyperlipidemia only. Readmitted for TIA in March 2018.  Right lobectomy for cancer in September2018  PLAN: Stressed the importance of management of risk factors to prevent further stroke Continue Aspirin for secondary stroke prevention Maintain strict control of hypertension with blood pressure goal below 130/90, today's reading 136/81 continue antihypertensive medications Cholesterol with LDL cholesterol less than 70, followed by primary care,  continue statin drugs Lipitor  home exercise program at least 30 minutes daily Loop recorder no far no atrial fibrillation eat healthy diet with whole grains,  fresh fruits and vegetables Discharge from stroke clinic I spent 25 minutes in total face to face time with the patient more than 50% of which was spent counseling and coordination of care, reviewing test results reviewing medications and discussing and reviewing the diagnosis of stroke/TIA and management of risk factors Dennie Bible, GNP, BC,  APRN  [facility] [address], [place], [place] [address] [phone]

## 2018-04-09 ENCOUNTER — Ambulatory Visit: Payer: Medicare HMO | Admitting: Nurse Practitioner

## 2018-04-09 ENCOUNTER — Encounter: Payer: Self-pay | Admitting: Nurse Practitioner

## 2018-04-09 VITALS — BP 136/81 | HR 98 | Ht 62.5 in | Wt 173.2 lb

## 2018-04-09 DIAGNOSIS — I10 Essential (primary) hypertension: Secondary | ICD-10-CM

## 2018-04-09 DIAGNOSIS — Z8673 Personal history of transient ischemic attack (TIA), and cerebral infarction without residual deficits: Secondary | ICD-10-CM | POA: Diagnosis not present

## 2018-04-09 DIAGNOSIS — G459 Transient cerebral ischemic attack, unspecified: Secondary | ICD-10-CM | POA: Diagnosis not present

## 2018-04-09 DIAGNOSIS — E785 Hyperlipidemia, unspecified: Secondary | ICD-10-CM

## 2018-04-09 NOTE — Patient Instructions (Addendum)
Stressed the importance of management of risk factors to prevent further stroke Continue Aspirin for secondary stroke prevention Maintain strict control of hypertension with blood pressure goal below 130/90, today's reading 136/81 continue antihypertensive medications Cholesterol with LDL cholesterol less than 70, followed by primary care,  continue statin drugs Lipitor  home exercise program at least 30 minutes daily eat healthy diet with whole grains,  fresh fruits and vegetables Discharge from stroke clinic   Stroke Prevention Some health problems and behaviors may make it more likely for you to have a stroke. Below are ways to lessen your risk of having a stroke.  Be active for at least 30 minutes on most or all days.  Do not smoke. Try not to be around others who smoke.  Do not drink too much alcohol. ? Do not have more than 2 drinks a day if you are a man. ? Do not have more than 1 drink a day if you are a woman and are not pregnant.  Eat healthy foods, such as fruits and vegetables. If you were put on a specific diet, follow the diet as told.  Keep your cholesterol levels under control through diet and medicines. Look for foods that are low in saturated fat, trans fat, cholesterol, and are high in fiber.  If you have diabetes, follow all diet plans and take your medicine as told.  Ask your doctor if you need treatment to lower your blood pressure. If you have high blood pressure (hypertension), follow all diet plans and take your medicine as told by your doctor.  If you are 49-47 years old, have your blood pressure checked every 3-5 years. If you are age 35 or older, have your blood pressure checked every year.  Keep a healthy weight. Eat foods that are low in calories, salt, saturated fat, trans fat, and cholesterol.  Do not take drugs.  Avoid birth control pills, if this applies. Talk to your doctor about the risks of taking birth control pills.  Talk to your doctor if  you have sleep problems (sleep apnea).  Take all medicine as told by your doctor. ? You may be told to take aspirin or blood thinner medicine. Take this medicine as told by your doctor. ? Understand your medicine instructions.  Make sure any other conditions you have are being taken care of.  Get help right away if:  You suddenly lose feeling (you feel numb) or have weakness in your face, arm, or leg.  Your face or eyelid hangs down to one side.  You suddenly feel confused.  You have trouble talking (aphasia) or understanding what people are saying.  You suddenly have trouble seeing in one or both eyes.  You suddenly have trouble walking.  You are dizzy.  You lose your balance or your movements are clumsy (uncoordinated).  You suddenly have a very bad headache and you do not know the cause.  You have new chest pain.  Your heart feels like it is fluttering or skipping a beat (irregular heartbeat). Do not wait to see if the symptoms above go away. Get help right away. Call your local emergency services (911 in [place] Do not drive yourself to the hospital. This information is not intended to replace advice given to you by your health care provider. Make sure you discuss any questions you have with your health care provider. Document Released: 04/21/2012 Document Revised: 03/28/2016 Document Reviewed: 04/23/2013 Elsevier Interactive Patient Education  [employer].

## 2018-04-10 NOTE — Progress Notes (Signed)
I agree with the above plan 

## 2018-04-20 LAB — CUP PACEART REMOTE DEVICE CHECK
MDC IDC PG IMPLANT DT: 20170919
MDC IDC SESS DTM: 20190522124028

## 2018-04-27 ENCOUNTER — Ambulatory Visit (INDEPENDENT_AMBULATORY_CARE_PROVIDER_SITE_OTHER): Payer: Medicare HMO | Admitting: *Deleted

## 2018-04-27 DIAGNOSIS — I639 Cerebral infarction, unspecified: Secondary | ICD-10-CM

## 2018-04-27 NOTE — Progress Notes (Signed)
[facility] Summary Report / Loop Recorder 

## 2018-05-04 ENCOUNTER — Other Ambulatory Visit: Payer: Self-pay | Admitting: Internal Medicine

## 2018-06-01 ENCOUNTER — Ambulatory Visit (INDEPENDENT_AMBULATORY_CARE_PROVIDER_SITE_OTHER): Payer: Medicare HMO | Admitting: *Deleted

## 2018-06-01 DIAGNOSIS — I639 Cerebral infarction, unspecified: Secondary | ICD-10-CM | POA: Diagnosis not present

## 2018-06-01 NOTE — Progress Notes (Signed)
[facility] Summary Report / Loop Recorder 

## 2018-06-04 ENCOUNTER — Other Ambulatory Visit: Payer: Self-pay | Admitting: Internal Medicine

## 2018-06-09 LAB — CUP PACEART REMOTE DEVICE CHECK
Implantable Pulse Generator Implant Date: 20170919
MDC IDC SESS DTM: 20190624123734

## 2018-06-10 ENCOUNTER — Telehealth: Payer: Self-pay | Admitting: Internal Medicine

## 2018-06-10 NOTE — Telephone Encounter (Signed)
Faxed medical records to [facility] at [phone], Release ID: [id number]

## 2018-06-26 ENCOUNTER — Inpatient Hospital Stay: Payer: Medicare HMO | Attending: Internal Medicine

## 2018-06-26 ENCOUNTER — Ambulatory Visit (HOSPITAL_COMMUNITY)
Admission: RE | Admit: 2018-06-26 | Discharge: 2018-06-26 | Disposition: A | Discharge status: Home / Self Care | Payer: Medicare HMO | Source: Ambulatory Visit | Attending: Internal Medicine | Admitting: Internal Medicine

## 2018-06-26 DIAGNOSIS — C3490 Malignant neoplasm of unspecified part of unspecified bronchus or lung: Secondary | ICD-10-CM | POA: Diagnosis not present

## 2018-06-26 DIAGNOSIS — I2510 Atherosclerotic heart disease of native coronary artery without angina pectoris: Secondary | ICD-10-CM | POA: Diagnosis not present

## 2018-06-26 DIAGNOSIS — Z85118 Personal history of other malignant neoplasm of bronchus and lung: Secondary | ICD-10-CM | POA: Insufficient documentation

## 2018-06-26 DIAGNOSIS — K449 Diaphragmatic hernia without obstruction or gangrene: Secondary | ICD-10-CM | POA: Insufficient documentation

## 2018-06-26 DIAGNOSIS — I700 Atherosclerosis of aorta: Secondary | ICD-10-CM | POA: Insufficient documentation

## 2018-06-26 DIAGNOSIS — Z902 Acquired absence of lung [part of]: Secondary | ICD-10-CM | POA: Diagnosis not present

## 2018-06-26 DIAGNOSIS — N200 Calculus of kidney: Secondary | ICD-10-CM | POA: Insufficient documentation

## 2018-06-26 LAB — CBC WITH DIFFERENTIAL (CANCER CENTER ONLY)
BASOS PCT: 1 %
Basophils Absolute: 0 10*3/uL (ref 0.0–0.1)
Eosinophils Absolute: 0.1 10*3/uL (ref 0.0–0.5)
Eosinophils Relative: 1 %
HCT: 35.9 % (ref 34.8–46.6)
Hemoglobin: 11.8 g/dL (ref 11.6–15.9)
LYMPHS PCT: 31 %
Lymphs Abs: 2 10*3/uL (ref 0.9–3.3)
MCH: 29.5 pg (ref 25.1–34.0)
MCHC: 32.9 g/dL (ref 31.5–36.0)
MCV: 89.8 fL (ref 79.5–101.0)
MONOS PCT: 10 %
Monocytes Absolute: 0.6 10*3/uL (ref 0.1–0.9)
NEUTROS ABS: 3.7 10*3/uL (ref 1.5–6.5)
NEUTROS PCT: 57 %
Platelet Count: 224 10*3/uL (ref 145–400)
RBC: 4 MIL/uL (ref 3.70–5.45)
RDW: 15.1 % — ABNORMAL HIGH (ref 11.2–14.5)
WBC Count: 6.5 10*3/uL (ref 3.9–10.3)

## 2018-06-26 LAB — CMP (CANCER CENTER ONLY)
ALBUMIN: 3.6 g/dL (ref 3.5–5.0)
ALT: 16 U/L (ref 0–44)
ANION GAP: 9 (ref 5–15)
AST: 15 U/L (ref 15–41)
Alkaline Phosphatase: 61 U/L (ref 38–126)
BUN: 13 mg/dL (ref 8–23)
CALCIUM: 10.2 mg/dL (ref 8.9–10.3)
CO2: 28 mmol/L (ref 22–32)
Chloride: 106 mmol/L (ref 98–111)
Creatinine: 1.06 mg/dL — ABNORMAL HIGH (ref 0.44–1.00)
GFR, Est AFR Am: >60 mL/min (ref >60)
GFR, Estimated: 52 mL/min — ABNORMAL LOW (ref >60)
GLUCOSE: 102 mg/dL — AB (ref 70–99)
Potassium: 3.7 mmol/L (ref 3.5–5.1)
Sodium: 143 mmol/L (ref 135–145)
Total Bilirubin: 0.4 mg/dL (ref 0.3–1.2)
Total Protein: 7.4 g/dL (ref 6.5–8.1)

## 2018-06-26 MED ORDER — IOHEXOL 300 MG/ML  SOLN
75.0000 mL | Freq: Once | INTRAMUSCULAR | Status: AC | PRN
  Administered 2018-06-26: 75 mL via INTRAVENOUS

## 2018-07-01 ENCOUNTER — Inpatient Hospital Stay: Payer: Medicare HMO | Admitting: Internal Medicine

## 2018-07-01 ENCOUNTER — Encounter: Payer: Self-pay | Admitting: Internal Medicine

## 2018-07-01 ENCOUNTER — Telehealth: Payer: Self-pay

## 2018-07-01 VITALS — BP 152/77 | HR 69 | Temp 98.9°F | Resp 18 | Ht 62.5 in | Wt 176.8 lb

## 2018-07-01 DIAGNOSIS — Z85118 Personal history of other malignant neoplasm of bronchus and lung: Secondary | ICD-10-CM

## 2018-07-01 DIAGNOSIS — C3490 Malignant neoplasm of unspecified part of unspecified bronchus or lung: Secondary | ICD-10-CM

## 2018-07-01 DIAGNOSIS — C3491 Malignant neoplasm of unspecified part of right bronchus or lung: Secondary | ICD-10-CM

## 2018-07-01 NOTE — Progress Notes (Signed)
[place] [phone]   [phone]  OFFICE PROGRESS NOTE  Biagio Borg, MD [place] [address]  DIAGNOSIS: Stage IA (T1b, N0, M0) non-small cell lung cancer, adenocarcinoma presented with right upper lobe pulmonary nodule   PRIOR THERAPY: Status post right upper lobectomy with lymph node dissection under the care of Dr. Roxan Hockey on July 30, 2017.  CURRENT THERAPY: Observation.  INTERVAL HISTORY: Kathleen Guerrero 72 y.o. female returns to the clinic today for follow-up visit.  The patient is feeling fine today with no specific complaints.  She denied having any chest pain, shortness of breath, cough or hemoptysis.  She denied having any fever or chills.  She has no nausea, vomiting, diarrhea or constipation.  She is very excited because her only daughter is currently pregnant.  The patient had repeat CT scan of the chest performed recently and she is here for evaluation and discussion of her risk her results.  MEDICAL HISTORY: Past Medical History:  Diagnosis Date  . Arthritis   . GERD (gastroesophageal reflux disease)    tums  . Headache   . Hepatitis    cmv 30 yrs ago hepatitis  . History of hiatal hernia   . History of loop recorder    after stroke  . Hypertension   . Stroke ([place]) 07/2016   balance a little off  . Tobacco use     ALLERGIES:  has No Known Allergies.  MEDICATIONS:  Current Outpatient Medications  Medication Sig Dispense Refill  . acetaminophen (TYLENOL) 325 MG tablet Take 650 mg by mouth daily as needed for mild pain or headache.    Marland Kitchen amiodarone (PACERONE) 200 MG tablet Take 1 tablet (200 mg total) by mouth daily. 30 tablet 0  . amLODipine (NORVASC) 10 MG tablet TAKE 1 TABLET BY MOUTH daily 90 tablet 0  . aspirin 325 MG tablet Take 1 tablet (325 mg total) by mouth daily. 30 tablet 11  . atorvastatin (LIPITOR) 40 MG tablet TAKE 1 TABLET BY MOUTH EVERY DAY AT 6 IN THE EVENING 90 tablet  1  . calcium carbonate (TUMS - DOSED IN MG ELEMENTAL CALCIUM) 500 MG chewable tablet Chew 1 tablet by mouth 4 (four) times daily as needed for indigestion or heartburn.    . cloNIDine (CATAPRES) 0.1 MG tablet Take 1 tablet (0.1 mg total) by mouth 2 (two) times daily. 60 tablet 11  . metoprolol tartrate (LOPRESSOR) 25 MG tablet Take 0.5 tablets (12.5 mg total) by mouth 2 (two) times daily. 60 tablet 5  . oxybutynin (DITROPAN-XL) 5 MG 24 hr tablet Take 1 tablet (5 mg total) by mouth at bedtime. 90 tablet 2   No current facility-administered medications for this visit.     SURGICAL HISTORY:  Past Surgical History:  Procedure Laterality Date  . APPENDECTOMY    . DERMOID CYST  EXCISION     x2 67'  . EP IMPLANTABLE DEVICE N/A 07/23/2016   Procedure: Loop Recorder Insertion;  Surgeon: Thompson Grayer, MD;  Location: [facility];  Service: Cardiovascular;  Laterality: N/A;  . Intestinal obstruction  1979  . LOBECTOMY Right 07/30/2017   Procedure: LOBECTOMY;  Surgeon: [name], MD;  Location: [facility];  Service: Thoracic;  Laterality: Right;  . TEE WITHOUT CARDIOVERSION N/A 07/23/2016   Procedure: TRANSESOPHAGEAL ECHOCARDIOGRAM (TEE);  Surgeon: Lelon Perla, MD;  Location: [facility];  Service: Cardiovascular;  Laterality: N/A;  . VIDEO ASSISTED  THORACOSCOPY (VATS)/WEDGE RESECTION Right 07/30/2017   Procedure: VIDEO ASSISTED THORACOSCOPY (VATS)/WEDGE RESECTION;  Surgeon: [name], MD;  [facility];  Service: Thoracic;  Laterality: Right;    REVIEW OF SYSTEMS:  A comprehensive review of systems was negative.   PHYSICAL EXAMINATION: General appearance: alert, cooperative and no distress Head: Normocephalic, without obvious abnormality, atraumatic Neck: no adenopathy, no JVD, supple, symmetrical, trachea midline and thyroid not enlarged, symmetric, no tenderness/mass/nodules Lymph nodes: Cervical, supraclavicular, and axillary nodes normal. Resp: clear to  auscultation bilaterally Back: symmetric, no curvature. ROM normal. No CVA tenderness. Cardio: regular rate and rhythm, S1, S2 normal, no murmur, click, rub or gallop GI: soft, non-tender; bowel sounds normal; no masses,  no organomegaly Extremities: extremities normal, atraumatic, no cyanosis or edema  ECOG PERFORMANCE STATUS: 0 - Asymptomatic  Blood pressure (!) 152/77, pulse 69, temperature 98.9 F (37.2 C), temperature source Oral, resp. rate 18, height 5' 2.5" (1.588 m), weight 176 lb 12.8 oz (80.2 kg), SpO2 98 %.  LABORATORY DATA: Lab Results  Component Value Date   WBC 6.5 06/26/2018   HGB 11.8 06/26/2018   HCT 35.9 06/26/2018   MCV 89.8 06/26/2018   PLT 224 06/26/2018      Chemistry      Component Value Date/Time   NA 143 06/26/2018 1026   K 3.7 06/26/2018 1026   CL 106 06/26/2018 1026   CO2 28 06/26/2018 1026   BUN 13 06/26/2018 1026   CREATININE 1.06 (H) 06/26/2018 1026      Component Value Date/Time   CALCIUM 10.2 06/26/2018 1026   ALKPHOS 61 06/26/2018 1026   AST 15 06/26/2018 1026   ALT 16 06/26/2018 1026   BILITOT 0.4 06/26/2018 1026       RADIOGRAPHIC STUDIES: Ct Chest W Contrast  Result Date: 06/26/2018 CLINICAL DATA:  Status post right upper lobectomy 07/30/2017 for well differentiated lung adenocarcinoma. Routine follow-up. EXAM: CT CHEST WITH CONTRAST TECHNIQUE: Multidetector CT imaging of the chest was performed during intravenous contrast administration. CONTRAST:  37mL OMNIPAQUE IOHEXOL 300 MG/ML  SOLN COMPARISON:  07/09/2017 chest CT.  02/03/2018 chest radiograph. FINDINGS: Cardiovascular: Normal heart size. No significant pericardial effusion/thickening. Left anterior descending and right coronary atherosclerosis. Atherosclerotic nonaneurysmal thoracic aorta. Normal caliber pulmonary arteries. No central pulmonary emboli. Mediastinum/Nodes: No discrete thyroid nodules. Unremarkable esophagus. No pathologically enlarged axillary, mediastinal or hilar  lymph nodes. Lungs/Pleura: No pneumothorax. No pleural effusion. Status post right upper lobectomy. Small curvilinear parenchymal bands in central right middle lobe compatible with postsurgical scarring. No acute consolidative airspace disease, lung masses or significant pulmonary nodules. Upper abdomen: Small hiatal hernia. Nonobstructing 6 mm upper left renal stone. Musculoskeletal: No aggressive appearing focal osseous lesions. Subcutaneous loop recorder in the medial ventral left chest. Stable circumscribed 4.0 cm superficial subcutaneous sebaceous cyst in left back near the midline (series 2/image 37). Marked thoracic spondylosis. IMPRESSION: 1. No evidence of local tumor recurrence in the right hemithorax status post right upper lobectomy. 2. No evidence of metastatic disease in the chest. 3. Two-vessel coronary atherosclerosis. 4. Small hiatal hernia. 5. Nonobstructing left nephrolithiasis. Aortic Atherosclerosis (ICD10-I70.0). Electronically Signed   By: Ilona Sorrel M.D.   On: 06/26/2018 13:59    ASSESSMENT AND PLAN: This is a very pleasant 72 years old white female with stage IA non-small cell lung cancer status post right upper lobectomy with lymph node dissection.  The patient is currently on observation and she is feeling fine. She had repeat CT scan of the chest performed recently.  I personally and independently reviewed the scans and discussed the results with the patient today. Her scan showed no concerning findings for disease recurrence. I recommended for the patient to continue on observation with repeat CT scan of the chest in 6 months. She was advised to call immediately if she has any concerning symptoms in the interval. The patient voices understanding of current disease status and treatment options and is in agreement with the current care plan.  All questions were answered. The patient knows to call the clinic with any problems, questions or concerns. We can certainly see the  patient much sooner if necessary.  I spent 10 minutes counseling the patient face to face. The total time spent in the appointment was 15 minutes.  Disclaimer: This note was dictated with voice recognition software. Similar sounding words can inadvertently be transcribed and may not be corrected upon review.

## 2018-07-01 NOTE — Telephone Encounter (Signed)
Printed  avs and calender of upcoming appointment. Per 8/28 los 

## 2018-07-02 ENCOUNTER — Ambulatory Visit (INDEPENDENT_AMBULATORY_CARE_PROVIDER_SITE_OTHER): Payer: Medicare HMO | Admitting: *Deleted

## 2018-07-02 DIAGNOSIS — I639 Cerebral infarction, unspecified: Secondary | ICD-10-CM | POA: Diagnosis not present

## 2018-07-03 NOTE — Progress Notes (Signed)
[facility] Summary Report / Loop Recorder 

## 2018-07-13 LAB — CUP PACEART REMOTE DEVICE CHECK
Date Time Interrogation Session: 20190727133728
MDC IDC PG IMPLANT DT: 20170919

## 2018-07-29 LAB — CUP PACEART REMOTE DEVICE CHECK
Date Time Interrogation Session: 20190829154100
MDC IDC PG IMPLANT DT: 20170919

## 2018-08-03 ENCOUNTER — Other Ambulatory Visit: Payer: Self-pay | Admitting: Internal Medicine

## 2018-08-04 ENCOUNTER — Encounter: Payer: Self-pay | Admitting: Thoracic Surgery (Cardiothoracic Vascular Surgery)

## 2018-08-04 ENCOUNTER — Ambulatory Visit (INDEPENDENT_AMBULATORY_CARE_PROVIDER_SITE_OTHER): Payer: Medicare HMO | Admitting: *Deleted

## 2018-08-04 ENCOUNTER — Other Ambulatory Visit: Payer: Self-pay

## 2018-08-04 ENCOUNTER — Ambulatory Visit: Payer: Medicare HMO | Admitting: Thoracic Surgery (Cardiothoracic Vascular Surgery)

## 2018-08-04 VITALS — BP 113/76 | HR 82 | Resp 16 | Ht 62.5 in | Wt 176.0 lb

## 2018-08-04 DIAGNOSIS — I639 Cerebral infarction, unspecified: Secondary | ICD-10-CM | POA: Diagnosis not present

## 2018-08-04 DIAGNOSIS — C3491 Malignant neoplasm of unspecified part of right bronchus or lung: Secondary | ICD-10-CM | POA: Diagnosis not present

## 2018-08-04 DIAGNOSIS — Z902 Acquired absence of lung [part of]: Secondary | ICD-10-CM

## 2018-08-04 NOTE — Progress Notes (Signed)
[facility] Summary Report / Loop Recorder 

## 2018-08-04 NOTE — Progress Notes (Signed)
[address]       [place] [address]             [phone]       HPI: Kathleen Guerrero returns for scheduled one-year follow-up visit  Kathleen Guerrero is a 72 year old woman with history of tobacco abuse who had a thoracoscopic right upper lobectomy in September 2018.  This was for a stage Ia non-small cell carcinoma.  She had severe delirium and acute respiratory failure postoperatively and was on the ventilator for about 2 weeks before being extubated.  She then had a slow but otherwise uneventful recovery after that.  I last saw her in the office in April.  She was doing well at that time.  She feels well.  She does not have any residual pain from the surgery.  She is not having any respiratory issues.  Saw Dr. Julien Nordmann last week.  She is excited because her daughter is expecting a child soon.  Past Medical History:  Diagnosis Date  . Arthritis   . GERD (gastroesophageal reflux disease)    tums  . Headache   . Hepatitis    cmv 30 yrs ago hepatitis  . History of hiatal hernia   . History of loop recorder    after stroke  . Hypertension   . Stroke ([place]) 07/2016   balance a little off  . Tobacco use    \ Current Outpatient Medications  Medication Sig Dispense Refill  . acetaminophen (TYLENOL) 325 MG tablet Take 650 mg by mouth daily as needed for mild pain or headache.    Marland Kitchen amiodarone (PACERONE) 200 MG tablet Take 1 tablet (200 mg total) by mouth daily. 30 tablet 0  . amLODipine (NORVASC) 10 MG tablet TAKE 1 TABLET BY MOUTH daily 90 tablet 0  . aspirin 325 MG tablet Take 1 tablet (325 mg total) by mouth daily. 30 tablet 11  . atorvastatin (LIPITOR) 40 MG tablet TAKE 1 TABLET BY MOUTH EVERY DAY AT 6 IN THE EVENING 90 tablet 1  . calcium carbonate (TUMS - DOSED IN MG ELEMENTAL CALCIUM) 500 MG chewable tablet Chew 1 tablet by mouth 4 (four) times daily as needed for indigestion or heartburn.    . cloNIDine (CATAPRES) 0.1 MG tablet Take 1 tablet (0.1 mg total)  by mouth 2 (two) times daily. 60 tablet 11  . metoprolol tartrate (LOPRESSOR) 25 MG tablet Take 0.5 tablets (12.5 mg total) by mouth 2 (two) times daily. 60 tablet 5  . oxybutynin (DITROPAN-XL) 5 MG 24 hr tablet Take 1 tablet (5 mg total) by mouth at bedtime. 90 tablet 2   No current facility-administered medications for this visit.     Physical Exam BP 113/76 (BP Location: Right Arm, Patient Position: Sitting, Cuff Size: Large)   Pulse 82   Resp 16   Ht 5' 2.5" (1.588 m)   Wt 176 lb (79.8 kg)   SpO2 95% Comment: ON RA  BMI 34.71 kg/m  49-year-old woman in no acute distress Alert and oriented x3 with no focal deficits Lungs clear with equal breath sounds bilaterally Cardiac regular rate and rhythm Incisions well-healed No cervical or supraclavicular adenopathy  Diagnostic Tests: CT CHEST WITH CONTRAST  TECHNIQUE: Multidetector CT imaging of the chest was performed during intravenous contrast administration.  CONTRAST:  37mL OMNIPAQUE IOHEXOL 300 MG/ML  SOLN  COMPARISON:  07/09/2017 chest CT.  02/03/2018 chest radiograph.  FINDINGS: Cardiovascular: Normal heart size. No significant pericardial effusion/thickening. Left anterior descending  and right coronary atherosclerosis. Atherosclerotic nonaneurysmal thoracic aorta. Normal caliber pulmonary arteries. No central pulmonary emboli.  Mediastinum/Nodes: No discrete thyroid nodules. Unremarkable esophagus. No pathologically enlarged axillary, mediastinal or hilar lymph nodes.  Lungs/Pleura: No pneumothorax. No pleural effusion. Status post right upper lobectomy. Small curvilinear parenchymal bands in central right middle lobe compatible with postsurgical scarring. No acute consolidative airspace disease, lung masses or significant pulmonary nodules.  Upper abdomen: Small hiatal hernia. Nonobstructing 6 mm upper left renal stone.  Musculoskeletal: No aggressive appearing focal osseous lesions. Subcutaneous loop  recorder in the medial ventral left chest. Stable circumscribed 4.0 cm superficial subcutaneous sebaceous cyst in left back near the midline (series 2/image 37). Marked thoracic spondylosis.  IMPRESSION: 1. No evidence of local tumor recurrence in the right hemithorax status post right upper lobectomy. 2. No evidence of metastatic disease in the chest. 3. Two-vessel coronary atherosclerosis. 4. Small hiatal hernia. 5. Nonobstructing left nephrolithiasis.  Aortic Atherosclerosis (ICD10-I70.0).   Electronically Signed   By: Ilona Sorrel M.D.   On: 06/26/2018 13:59 I personally reviewed the CT images and concur with the findings noted above  Impression: Kathleen Guerrero is a 72 year old former smoker who had a thoracoscopic right upper lobectomy for a stage IA non-small cell carcinoma about a year ago.  She had a comp gated early postoperative course with acute delirium and respiratory failure.  After getting through a rough early phase she has done extremely well.  She does not have any residual adverse effects from the surgery.  She is not having any pain.  Her respiratory status has been stable.  Lung cancer -stage Ia non-small cell carcinoma.  She will continue to follow-up with Dr. Julien Nordmann.  She does not need to see me to go over the same issues.  I am available at any time if I can be of any assistance with her care.  Tobacco abuse-quit smoking in 2017.  Hypertension-blood pressure well controlled.  Managed by primary  [name], MD [facility] [phone]

## 2018-08-05 LAB — CUP PACEART REMOTE DEVICE CHECK
Date Time Interrogation Session: 20191001154112
MDC IDC PG IMPLANT DT: 20170919

## 2018-09-03 ENCOUNTER — Other Ambulatory Visit: Payer: Self-pay | Admitting: Internal Medicine

## 2018-09-07 ENCOUNTER — Ambulatory Visit (INDEPENDENT_AMBULATORY_CARE_PROVIDER_SITE_OTHER): Payer: Medicare HMO | Admitting: *Deleted

## 2018-09-07 DIAGNOSIS — I639 Cerebral infarction, unspecified: Secondary | ICD-10-CM

## 2018-09-07 NOTE — Progress Notes (Signed)
[facility] Summary Report / Loop Recorder 

## 2018-09-08 ENCOUNTER — Other Ambulatory Visit: Payer: Self-pay | Admitting: Internal Medicine

## 2018-09-10 ENCOUNTER — Other Ambulatory Visit (INDEPENDENT_AMBULATORY_CARE_PROVIDER_SITE_OTHER): Payer: Medicare HMO

## 2018-09-10 ENCOUNTER — Encounter: Payer: Self-pay | Admitting: Internal Medicine

## 2018-09-10 ENCOUNTER — Ambulatory Visit (INDEPENDENT_AMBULATORY_CARE_PROVIDER_SITE_OTHER): Payer: Medicare HMO | Admitting: Internal Medicine

## 2018-09-10 VITALS — BP 116/76 | HR 94 | Temp 98.5°F | Ht 62.5 in | Wt 178.0 lb

## 2018-09-10 DIAGNOSIS — E2839 Other primary ovarian failure: Secondary | ICD-10-CM

## 2018-09-10 DIAGNOSIS — R739 Hyperglycemia, unspecified: Secondary | ICD-10-CM

## 2018-09-10 DIAGNOSIS — Z23 Encounter for immunization: Secondary | ICD-10-CM

## 2018-09-10 DIAGNOSIS — Z0000 Encounter for general adult medical examination without abnormal findings: Secondary | ICD-10-CM

## 2018-09-10 LAB — URINALYSIS, ROUTINE W REFLEX MICROSCOPIC
BILIRUBIN URINE: NEGATIVE
HGB URINE DIPSTICK: NEGATIVE
Ketones, ur: NEGATIVE
LEUKOCYTES UA: NEGATIVE
NITRITE: NEGATIVE
RBC / HPF: NONE SEEN (ref 0–?)
Specific Gravity, Urine: 1.01 (ref 1.000–1.030)
TOTAL PROTEIN, URINE-UPE24: NEGATIVE
URINE GLUCOSE: NEGATIVE
Urobilinogen, UA: 0.2 (ref 0.0–1.0)
pH: 6 (ref 5.0–8.0)

## 2018-09-10 LAB — BASIC METABOLIC PANEL
BUN: 17 mg/dL (ref 6–23)
CHLORIDE: 103 meq/L (ref 96–112)
CO2: 32 meq/L (ref 19–32)
Calcium: 11.4 mg/dL — ABNORMAL HIGH (ref 8.4–10.5)
Creatinine, Ser: 1.24 mg/dL — ABNORMAL HIGH (ref 0.40–1.20)
GFR: 45.39 mL/min — ABNORMAL LOW (ref >60.00)
Glucose, Bld: 135 mg/dL — ABNORMAL HIGH (ref 70–99)
Potassium: 3.3 mEq/L — ABNORMAL LOW (ref 3.5–5.1)
Sodium: 142 mEq/L (ref 135–145)

## 2018-09-10 LAB — LIPID PANEL
CHOL/HDL RATIO: 2
CHOLESTEROL: 97 mg/dL (ref 0–200)
HDL: 45 mg/dL (ref >39.00)
LDL CALC: 28 mg/dL (ref 0–99)
NonHDL: 51.6
TRIGLYCERIDES: 118 mg/dL (ref 0.0–149.0)
VLDL: 23.6 mg/dL (ref 0.0–40.0)

## 2018-09-10 LAB — CBC WITH DIFFERENTIAL/PLATELET
Basophils Absolute: 0.1 10*3/uL (ref 0.0–0.1)
Basophils Relative: 0.8 % (ref 0.0–3.0)
Eosinophils Absolute: 0.1 10*3/uL (ref 0.0–0.7)
Eosinophils Relative: 1 % (ref 0.0–5.0)
HCT: 37 % (ref 36.0–46.0)
Hemoglobin: 12.4 g/dL (ref 12.0–15.0)
LYMPHS ABS: 2.4 10*3/uL (ref 0.7–4.0)
LYMPHS PCT: 28.1 % (ref 12.0–46.0)
MCHC: 33.5 g/dL (ref 30.0–36.0)
MCV: 87.8 fl (ref 78.0–100.0)
Monocytes Absolute: 0.8 10*3/uL (ref 0.1–1.0)
Monocytes Relative: 8.8 % (ref 3.0–12.0)
NEUTROS ABS: 5.2 10*3/uL (ref 1.4–7.7)
NEUTROS PCT: 61.3 % (ref 43.0–77.0)
PLATELETS: 235 10*3/uL (ref 150.0–400.0)
RBC: 4.21 Mil/uL (ref 3.87–5.11)
RDW: 15 % (ref 11.5–15.5)
WBC: 8.5 10*3/uL (ref 4.0–10.5)

## 2018-09-10 LAB — HEPATIC FUNCTION PANEL
ALBUMIN: 4.1 g/dL (ref 3.5–5.2)
ALT: 15 U/L (ref 0–35)
AST: 16 U/L (ref 0–37)
Alkaline Phosphatase: 48 U/L (ref 39–117)
Bilirubin, Direct: 0.1 mg/dL (ref 0.0–0.3)
TOTAL PROTEIN: 7.3 g/dL (ref 6.0–8.3)
Total Bilirubin: 0.6 mg/dL (ref 0.2–1.2)

## 2018-09-10 LAB — HEMOGLOBIN A1C: HEMOGLOBIN A1C: 5.6 % (ref 4.6–6.5)

## 2018-09-10 LAB — TSH: TSH: 1.56 u[IU]/mL (ref 0.35–4.50)

## 2018-09-10 NOTE — Progress Notes (Signed)
Subjective:    Patient ID: Kathleen Guerrero, female    DOB: 1945/12/10, 72 y.o.   MRN: [id number]  HPI    Here for wellness and f/u;  Overall doing ok;  Pt denies Chest pain, worsening SOB, DOE, wheezing, orthopnea, PND, worsening LE edema, palpitations, dizziness or syncope.  Pt denies neurological change such as new headache, facial or extremity weakness.  Pt denies polydipsia, polyuria, or low sugar symptoms. Pt states overall good compliance with treatment and medications, good tolerability, and has been trying to follow appropriate diet.  Pt denies worsening depressive symptoms, suicidal ideation or panic. No fever, night sweats, wt loss, loss of appetite, or other constitutional symptoms.  Pt states good ability with ADL's, has low fall risk, home safety reviewed and adequate, no other significant changes in hearing or vision, and only occasionally active with exercise.  No new complaints Wt Readings from Last 3 Encounters:  09/10/18 178 lb (80.7 kg)  08/04/18 176 lb (79.8 kg)  07/01/18 176 lb 12.8 oz (80.2 kg)  [place]ans to stay in [place] soon with [place] for several months to help with new first grandchild.  Seems to have lower mood in the fall each year, good  Compliance with antidepressant.   Past Medical History:  Diagnosis Date  . Arthritis   . GERD (gastroesophageal reflux disease)    tums  . Headache   . Hepatitis    cmv 30 yrs ago hepatitis  . History of hiatal hernia   . History of loop recorder    after stroke  . Hypertension   . Stroke ([place]) 07/2016   balance a little off  . Tobacco use    Past Surgical History:  Procedure Laterality Date  . APPENDECTOMY    . DERMOID CYST  EXCISION     x2 45'  . EP IMPLANTABLE DEVICE N/A 07/23/2016   Procedure: Loop Recorder Insertion;  Surgeon: Thompson Grayer, MD;  Location: [facility];  Service: Cardiovascular;  Laterality: N/A;  . Intestinal obstruction  1979  . LOBECTOMY Right 07/30/2017   Procedure: LOBECTOMY;  Surgeon:  [name], MD;  Location: [facility];  Service: Thoracic;  Laterality: Right;  . TEE WITHOUT CARDIOVERSION N/A 07/23/2016   Procedure: TRANSESOPHAGEAL ECHOCARDIOGRAM (TEE);  Surgeon: Lelon Perla, MD;  Location: [facility];  Service: Cardiovascular;  Laterality: N/A;  . VIDEO ASSISTED THORACOSCOPY (VATS)/WEDGE RESECTION Right 07/30/2017   Procedure: VIDEO ASSISTED THORACOSCOPY (VATS)/WEDGE RESECTION;  Surgeon: [name], MD;  Location: [facility];  Service: Thoracic;  Laterality: Right;    reports that she quit smoking about 2 years ago. Her smoking use included cigarettes. She has a 50.00 pack-year smoking history. She has never used smokeless tobacco. She reports that she drinks alcohol. She reports that she does not use drugs. family history includes Cancer in her maternal grandfather; Kidney disease in her maternal grandmother. No Known Allergies Current Outpatient Medications on File Prior to Visit  Medication Sig Dispense Refill  . acetaminophen (TYLENOL) 325 MG tablet Take 650 mg by mouth daily as needed for mild pain or headache.    Marland Kitchen amiodarone (PACERONE) 200 MG tablet Take 1 tablet (200 mg total) by mouth daily. 30 tablet 0  . amLODipine (NORVASC) 10 MG tablet TAKE 1 TABLET BY MOUTH EVERY DAY 90 tablet 0  . aspirin 325 MG tablet Take 1 tablet (325 mg total) by mouth daily. 30 tablet 11  . atorvastatin (LIPITOR) 40 MG tablet TAKE 1 TABLET BY MOUTH EVERY  DAY AT 6 IN THE EVENING 90 tablet 1  . calcium carbonate (TUMS - DOSED IN MG ELEMENTAL CALCIUM) 500 MG chewable tablet Chew 1 tablet by mouth 4 (four) times daily as needed for indigestion or heartburn.    . cloNIDine (CATAPRES) 0.1 MG tablet Take 1 tablet (0.1 mg total) by mouth 2 (two) times daily. 60 tablet 11  . escitalopram (LEXAPRO) 10 MG tablet TAKE 1 TABLET BY MOUTH EVERY DAY 90 tablet 1  . metoprolol tartrate (LOPRESSOR) 25 MG tablet Take 0.5 tablets (12.5 mg total) by mouth 2 (two) times daily. 60 tablet 5  .  oxybutynin (DITROPAN-XL) 5 MG 24 hr tablet Take 1 tablet (5 mg total) by mouth at bedtime. 90 tablet 1   No current facility-administered medications on file prior to visit.    Review of Systems  Constitutional: Negative for other unusual diaphoresis, sweats, appetite or weight changes HENT: Negative for other worsening hearing loss, ear pain, facial swelling, mouth sores or neck stiffness.   Eyes: Negative for other worsening pain, redness or other visual disturbance.  Respiratory: Negative for other stridor or swelling Cardiovascular: Negative for other palpitations or other chest pain  Gastrointestinal: Negative for worsening diarrhea or loose stools, blood in stool, distention or other pain Genitourinary: Negative for hematuria, flank pain or other change in urine volume.  Musculoskeletal: Negative for myalgias or other joint swelling.  Skin: Negative for other color change, or other wound or worsening drainage.  Neurological: Negative for other syncope or numbness. Hematological: Negative for other adenopathy or swelling Psychiatric/Behavioral: Negative for hallucinations, other worsening agitation, SI, self-injury, or new decreased concentration All other system neg per pt    Objective:   Physical Exam BP 116/76   Pulse 94   Temp 98.5 F (36.9 C) (Oral)   Ht 5' 2.5" (1.588 m)   Wt 178 lb (80.7 kg)   SpO2 96%   BMI 32.04 kg/m  VS noted,  Constitutional: Pt is oriented to person, place, and time. Appears well-developed and well-nourished, in no significant distress and comfortable Head: Normocephalic and atraumatic  Eyes: Conjunctivae and EOM are normal. Pupils are equal, round, and reactive to light Right Ear: External ear normal without discharge Left Ear: External ear normal without discharge Nose: Nose without discharge or deformity Mouth/Throat: Oropharynx is without other ulcerations and moist  Neck: Normal range of motion. Neck supple. No JVD present. No tracheal  deviation present or significant neck LA or mass Cardiovascular: Normal rate, regular rhythm, normal heart sounds and intact distal pulses.   Pulmonary/Chest: WOB normal and breath sounds without rales or wheezing  Abdominal: Soft. Bowel sounds are normal. NT. No HSM  Musculoskeletal: Normal range of motion. Exhibits no edema Lymphadenopathy: Has no other cervical adenopathy.  Neurological: Pt is alert and oriented to person, place, and time. Pt has normal reflexes. No cranial nerve deficit. Motor grossly intact, Gait intact Skin: Skin is warm and dry. No rash noted or new ulcerations Psychiatric:  Has normal mood and affect. Behavior is normal without agitation Lab Results  Component Value Date   WBC 6.5 06/26/2018   HGB 11.8 06/26/2018   HCT 35.9 06/26/2018   PLT 224 06/26/2018   GLUCOSE 102 (H) 06/26/2018   CHOL 115 10/01/2017   TRIG 120.0 10/01/2017   HDL 44.50 10/01/2017   LDLCALC 46 10/01/2017   ALT 16 06/26/2018   AST 15 06/26/2018   NA 143 06/26/2018   K 3.7 06/26/2018   CL 106 06/26/2018   CREATININE  1.06 (H) 06/26/2018   BUN 13 06/26/2018   CO2 28 06/26/2018   TSH 1.83 10/01/2017   INR 0.99 07/28/2017   HGBA1C 5.3 10/01/2017       Assessment & Plan:

## 2018-09-10 NOTE — Patient Instructions (Addendum)
You had the flu shot today, as well as the Tdap tetanus shot and Pneumovax pneumonia shot  Please schedule the bone density test before leaving today at the scheduling desk (where you check out)  Please continue all other medications as before, and refills have been done if requested.  Please have the pharmacy call with any other refills you may need.  Please continue your efforts at being more active, low cholesterol diet, and weight control.  You are otherwise up to date with prevention measures today.  Please keep your appointments with your specialists as you may have planned  Please go to the LAB in the Basement (turn left off the elevator) for the tests to be done today  You will be contacted by phone if any changes need to be made immediately.  Otherwise, you will receive a letter about your results with an explanation, but please check with MyChart first.  Please remember to sign up for MyChart if you have not done so, as this will be important to you in the future with finding out test results, communicating by private email, and scheduling acute appointments online when needed.  Please return in 1 year for your yearly visit, or sooner if needed, with Lab testing done 3-5 days before

## 2018-09-10 NOTE — Assessment & Plan Note (Signed)
stable overall by history and exam, recent data reviewed with pt, and pt to continue medical treatment as before,  to f/u any worsening symptoms or concerns  

## 2018-09-10 NOTE — Assessment & Plan Note (Signed)

## 2018-10-09 ENCOUNTER — Ambulatory Visit (INDEPENDENT_AMBULATORY_CARE_PROVIDER_SITE_OTHER): Payer: Medicare HMO

## 2018-10-09 DIAGNOSIS — I639 Cerebral infarction, unspecified: Secondary | ICD-10-CM

## 2018-10-12 NOTE — Progress Notes (Signed)
[facility] Summary Report / Loop Recorder 

## 2018-10-14 ENCOUNTER — Encounter: Payer: Self-pay | Admitting: Cardiology

## 2018-10-20 ENCOUNTER — Telehealth: Payer: Self-pay

## 2018-10-20 ENCOUNTER — Encounter: Payer: Self-pay | Admitting: Cardiology

## 2018-10-20 NOTE — Progress Notes (Signed)
Letter  

## 2018-10-20 NOTE — Telephone Encounter (Signed)
LMOVM requesting that pt send manual transmission b/c home monitor has not updated in at least 14 days.    

## 2018-10-31 LAB — CUP PACEART REMOTE DEVICE CHECK
Date Time Interrogation Session: 20191103164009
Implantable Pulse Generator Implant Date: 20170919

## 2018-11-11 ENCOUNTER — Ambulatory Visit (INDEPENDENT_AMBULATORY_CARE_PROVIDER_SITE_OTHER): Payer: Medicare HMO

## 2018-11-11 DIAGNOSIS — I639 Cerebral infarction, unspecified: Secondary | ICD-10-CM

## 2018-11-12 ENCOUNTER — Encounter: Payer: Self-pay | Admitting: Internal Medicine

## 2018-11-12 ENCOUNTER — Ambulatory Visit: Payer: Medicare HMO | Admitting: Internal Medicine

## 2018-11-12 VITALS — BP 140/70 | HR 76 | Temp 98.9°F | Ht 62.5 in | Wt 172.0 lb

## 2018-11-12 DIAGNOSIS — I10 Essential (primary) hypertension: Secondary | ICD-10-CM | POA: Diagnosis not present

## 2018-11-12 DIAGNOSIS — K219 Gastro-esophageal reflux disease without esophagitis: Secondary | ICD-10-CM

## 2018-11-12 DIAGNOSIS — R739 Hyperglycemia, unspecified: Secondary | ICD-10-CM

## 2018-11-12 LAB — CUP PACEART REMOTE DEVICE CHECK
MDC IDC PG IMPLANT DT: 20170919
MDC IDC SESS DTM: 20200108170619

## 2018-11-12 MED ORDER — PANTOPRAZOLE SODIUM 40 MG PO TBEC: 40.0000 mg | DELAYED_RELEASE_TABLET | Freq: Every day | ORAL | 3 refills | Status: DC

## 2018-11-12 MED ORDER — ONDANSETRON HCL 4 MG PO TABS: 4.0000 mg | ORAL_TABLET | Freq: Three times a day (TID) | ORAL | 0 refills | Status: AC | PRN

## 2018-11-12 NOTE — Progress Notes (Signed)
[facility] Summary Report / Loop Recorder 

## 2018-11-12 NOTE — Assessment & Plan Note (Signed)
stable overall by history and exam, recent data reviewed with pt, and pt to continue medical treatment as before,  to f/u any worsening symptoms or concerns  
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
## 2018-11-12 NOTE — Patient Instructions (Addendum)
Please take all new medication as prescribed - the protonix 40 mg per day, and zofran as needed for nausea  Please take the protonix for 1 month, then try to hold, but restart if the symptoms recur  Please call for GI referral for any worsening pain, vomiting or blood or other unusual symptoms  Please continue all other medications as before, and refills have been done if requested.  Please have the pharmacy call with any other refills you may need.  Please keep your appointments with your specialists as you may have planned

## 2018-11-12 NOTE — Assessment & Plan Note (Signed)
With marked worsening frequency and severity of symptoms despite no diet change, NSAID or increased ETOH; for protonix 40 qd, tums prn infrequently, and zofran ODT prn

## 2018-11-22 LAB — CUP PACEART REMOTE DEVICE CHECK
Implantable Pulse Generator Implant Date: 20170919
MDC IDC SESS DTM: 20191206163817

## 2018-11-26 ENCOUNTER — Ambulatory Visit (INDEPENDENT_AMBULATORY_CARE_PROVIDER_SITE_OTHER)
Admission: RE | Admit: 2018-11-26 | Discharge: 2018-11-26 | Disposition: A | Discharge status: Home / Self Care | Payer: Medicare HMO | Source: Ambulatory Visit | Attending: Internal Medicine | Admitting: Internal Medicine

## 2018-11-26 DIAGNOSIS — E2839 Other primary ovarian failure: Secondary | ICD-10-CM | POA: Diagnosis not present

## 2018-12-14 ENCOUNTER — Ambulatory Visit (INDEPENDENT_AMBULATORY_CARE_PROVIDER_SITE_OTHER): Payer: Medicare HMO

## 2018-12-14 DIAGNOSIS — I639 Cerebral infarction, unspecified: Secondary | ICD-10-CM | POA: Diagnosis not present

## 2018-12-14 LAB — CUP PACEART REMOTE DEVICE CHECK
Date Time Interrogation Session: 20200210153327
Implantable Pulse Generator Implant Date: 20170919

## 2018-12-22 ENCOUNTER — Ambulatory Visit: Payer: Self-pay | Admitting: *Deleted

## 2018-12-22 MED ORDER — PANTOPRAZOLE SODIUM 40 MG PO TBEC: 40.0000 mg | DELAYED_RELEASE_TABLET | Freq: Every day | ORAL | 3 refills | Status: DC

## 2018-12-22 NOTE — Addendum Note (Signed)
Addended by: Biagio Borg on: 12/22/2018 12:40 PM   Modules accepted: Orders

## 2018-12-22 NOTE — Telephone Encounter (Signed)
Ok, this is done, thanks

## 2018-12-22 NOTE — Telephone Encounter (Signed)
Summary: medication questions    Pt called and stated that she would like to discuss pantoprazole (PROTONIX) 40 MG tablet [[id number]]. Please advise      Patient states she has had good response with protonix- she has been off of it 1 1/2 weeks and she is progressively gotten worse- especially at night. Patient wants to know if she can take this medication on a regular basis- she has been doing so well on it.  Reason for Disposition . Caller has NON-URGENT medication question about med that PCP prescribed and triager unable to answer question  Answer Assessment - Initial Assessment Questions 1. SYMPTOMS: "Do you have any symptoms?"     Reflux worse off medication 2. SEVERITY: If symptoms are present, ask "Are they mild, moderate or severe?"     Progressively increasing worse  Protocols used: MEDICATION QUESTION CALL-A-AH

## 2018-12-24 NOTE — Progress Notes (Signed)
[facility] Summary Report / Loop Recorder 

## 2018-12-28 ENCOUNTER — Other Ambulatory Visit: Payer: Medicare HMO

## 2018-12-30 ENCOUNTER — Ambulatory Visit: Payer: Medicare HMO | Admitting: Internal Medicine

## 2019-01-04 ENCOUNTER — Other Ambulatory Visit: Payer: Self-pay | Admitting: Internal Medicine

## 2019-01-04 ENCOUNTER — Ambulatory Visit (HOSPITAL_COMMUNITY)
Admission: RE | Admit: 2019-01-04 | Discharge: 2019-01-04 | Disposition: A | Discharge status: Home / Self Care | Payer: Medicare HMO | Source: Ambulatory Visit | Attending: Internal Medicine | Admitting: Internal Medicine

## 2019-01-04 ENCOUNTER — Inpatient Hospital Stay: Payer: Medicare HMO | Attending: Internal Medicine

## 2019-01-04 DIAGNOSIS — Z85118 Personal history of other malignant neoplasm of bronchus and lung: Secondary | ICD-10-CM | POA: Diagnosis present

## 2019-01-04 DIAGNOSIS — C3490 Malignant neoplasm of unspecified part of unspecified bronchus or lung: Secondary | ICD-10-CM

## 2019-01-04 LAB — CMP (CANCER CENTER ONLY)
ALK PHOS: 87 U/L (ref 38–126)
ALT: 17 U/L (ref 0–44)
AST: 21 U/L (ref 15–41)
Albumin: 3.5 g/dL (ref 3.5–5.0)
Anion gap: 10 (ref 5–15)
BILIRUBIN TOTAL: 0.5 mg/dL (ref 0.3–1.2)
BUN: 19 mg/dL (ref 8–23)
CALCIUM: 9.5 mg/dL (ref 8.9–10.3)
CHLORIDE: 106 mmol/L (ref 98–111)
CO2: 26 mmol/L (ref 22–32)
Creatinine: 1.05 mg/dL — ABNORMAL HIGH (ref 0.44–1.00)
GFR, Estimated: 54 mL/min — ABNORMAL LOW (ref >60)
Glucose, Bld: 110 mg/dL — ABNORMAL HIGH (ref 70–99)
Potassium: 4.1 mmol/L (ref 3.5–5.1)
Sodium: 142 mmol/L (ref 135–145)
TOTAL PROTEIN: 7.8 g/dL (ref 6.5–8.1)

## 2019-01-04 LAB — CBC WITH DIFFERENTIAL (CANCER CENTER ONLY)
ABS IMMATURE GRANULOCYTES: 0.12 10*3/uL — AB (ref 0.00–0.07)
BASOS PCT: 2 %
Basophils Absolute: 0.1 10*3/uL (ref 0.0–0.1)
Eosinophils Absolute: 0.1 10*3/uL (ref 0.0–0.5)
Eosinophils Relative: 1 %
HEMATOCRIT: 37.9 % (ref 36.0–46.0)
HEMOGLOBIN: 11.6 g/dL — AB (ref 12.0–15.0)
IMMATURE GRANULOCYTES: 2 %
Lymphocytes Relative: 31 %
Lymphs Abs: 2.4 10*3/uL (ref 0.7–4.0)
MCH: 27.9 pg (ref 26.0–34.0)
MCHC: 30.6 g/dL (ref 30.0–36.0)
MCV: 91.1 fL (ref 80.0–100.0)
MONO ABS: 0.7 10*3/uL (ref 0.1–1.0)
Monocytes Relative: 9 %
NEUTROS ABS: 4.3 10*3/uL (ref 1.7–7.7)
NRBC: 0 % (ref 0.0–0.2)
Neutrophils Relative %: 55 %
PLATELETS: 232 10*3/uL (ref 150–400)
RBC: 4.16 MIL/uL (ref 3.87–5.11)
RDW: 14.6 % (ref 11.5–15.5)
WBC: 7.8 10*3/uL (ref 4.0–10.5)

## 2019-01-04 MED ORDER — SODIUM CHLORIDE (PF) 0.9 % IJ SOLN
INTRAMUSCULAR | Status: AC
  Filled 2019-01-04: qty 50

## 2019-01-04 MED ORDER — IOHEXOL 300 MG/ML  SOLN
75.0000 mL | Freq: Once | INTRAMUSCULAR | Status: AC | PRN
  Administered 2019-01-04: 75 mL via INTRAVENOUS

## 2019-01-06 ENCOUNTER — Other Ambulatory Visit: Payer: Self-pay

## 2019-01-06 ENCOUNTER — Encounter: Payer: Self-pay | Admitting: Internal Medicine

## 2019-01-06 ENCOUNTER — Inpatient Hospital Stay: Payer: Medicare HMO | Admitting: Internal Medicine

## 2019-01-06 VITALS — BP 136/79 | HR 87 | Temp 98.7°F | Resp 20 | Ht 62.0 in | Wt 177.1 lb

## 2019-01-06 DIAGNOSIS — C3491 Malignant neoplasm of unspecified part of right bronchus or lung: Secondary | ICD-10-CM

## 2019-01-06 DIAGNOSIS — Z85118 Personal history of other malignant neoplasm of bronchus and lung: Secondary | ICD-10-CM | POA: Diagnosis not present

## 2019-01-06 DIAGNOSIS — C3490 Malignant neoplasm of unspecified part of unspecified bronchus or lung: Secondary | ICD-10-CM

## 2019-01-06 NOTE — Progress Notes (Signed)
[facility] [phone]   [phone]  OFFICE PROGRESS NOTE  Biagio Borg, MD [place] [address]  DIAGNOSIS: Stage IA (T1b, N0, M0) non-small cell lung cancer, adenocarcinoma presented with right upper lobe pulmonary nodule   PRIOR THERAPY: Status post right upper lobectomy with lymph node dissection under the care of Dr. Roxan Hockey on July 30, 2017.  CURRENT THERAPY: Observation.  INTERVAL HISTORY: Kathleen Guerrero 73 y.o. female returns to the clinic today for follow-up visit.  The patient is feeling fine today with no concerning complaints.  She has no recent chest pain, shortness of breath, cough or hemoptysis.  She has no nausea, vomiting, diarrhea or constipation.  She denied having any headache or visual changes.  She is here today for evaluation with repeat CT scan of the chest for restaging of her disease.  MEDICAL HISTORY: Past Medical History:  Diagnosis Date  . Arthritis   . GERD (gastroesophageal reflux disease)    tums  . Headache   . Hepatitis    cmv 30 yrs ago hepatitis  . History of hiatal hernia   . History of loop recorder    after stroke  . Hypertension   . Stroke ([place]) 07/2016   balance a little off  . Tobacco use     ALLERGIES:  has No Known Allergies.  MEDICATIONS:  Current Outpatient Medications  Medication Sig Dispense Refill  . acetaminophen (TYLENOL) 325 MG tablet Take 650 mg by mouth daily as needed for mild pain or headache.    Marland Kitchen amiodarone (PACERONE) 200 MG tablet Take 1 tablet (200 mg total) by mouth daily. 30 tablet 0  . amLODipine (NORVASC) 10 MG tablet TAKE 1 TABLET BY MOUTH EVERY DAY 90 tablet 3  . aspirin 325 MG tablet Take 1 tablet (325 mg total) by mouth daily. 30 tablet 11  . atorvastatin (LIPITOR) 40 MG tablet TAKE 1 TABLET BY MOUTH EVERY DAY AT 6 IN THE EVENING 90 tablet 1  . calcium carbonate (TUMS - DOSED IN MG ELEMENTAL CALCIUM) 500 MG chewable tablet Chew 1  tablet by mouth 4 (four) times daily as needed for indigestion or heartburn.    . cloNIDine (CATAPRES) 0.1 MG tablet Take 1 tablet (0.1 mg total) by mouth 2 (two) times daily. 60 tablet 11  . escitalopram (LEXAPRO) 10 MG tablet TAKE 1 TABLET BY MOUTH EVERY DAY 90 tablet 1  . metoprolol tartrate (LOPRESSOR) 25 MG tablet Take 0.5 tablets (12.5 mg total) by mouth 2 (two) times daily. 60 tablet 5  . ondansetron (ZOFRAN) 4 MG tablet Take 1 tablet (4 mg total) by mouth every 8 (eight) hours as needed for nausea or vomiting. 20 tablet 0  . pantoprazole (PROTONIX) 40 MG tablet Take 1 tablet (40 mg total) by mouth daily. 90 tablet 3   No current facility-administered medications for this visit.     SURGICAL HISTORY:  Past Surgical History:  Procedure Laterality Date  . APPENDECTOMY    . DERMOID CYST  EXCISION     x2 59'  . EP IMPLANTABLE DEVICE N/A 07/23/2016   Procedure: Loop Recorder Insertion;  Surgeon: Thompson Grayer, MD;  Location: [facility];  Service: Cardiovascular;  Laterality: N/A;  . Intestinal obstruction  1979  . LOBECTOMY Right 07/30/2017   Procedure: LOBECTOMY;  Surgeon: [name], MD;  Location: [facility];  Service: Thoracic;  Laterality: Right;  . TEE WITHOUT CARDIOVERSION N/A 07/23/2016  Procedure: TRANSESOPHAGEAL ECHOCARDIOGRAM (TEE);  Surgeon: Lelon Perla, MD;  Location: [facility];  Service: Cardiovascular;  Laterality: N/A;  . VIDEO ASSISTED THORACOSCOPY (VATS)/WEDGE RESECTION Right 07/30/2017   Procedure: VIDEO ASSISTED THORACOSCOPY (VATS)/WEDGE RESECTION;  Surgeon: [name], MD;  Location[facility];  Service: Thoracic;  Laterality: Right;    REVIEW OF SYSTEMS:  A comprehensive review of systems was negative.   PHYSICAL EXAMINATION: General appearance: alert, cooperative and no distress Head: Normocephalic, without obvious abnormality, atraumatic Neck: no adenopathy, no JVD, supple, symmetrical, trachea midline and thyroid not enlarged,  symmetric, no tenderness/mass/nodules Lymph nodes: Cervical, supraclavicular, and axillary nodes normal. Resp: clear to auscultation bilaterally Back: symmetric, no curvature. ROM normal. No CVA tenderness. Cardio: regular rate and rhythm, S1, S2 normal, no murmur, click, rub or gallop GI: soft, non-tender; bowel sounds normal; no masses,  no organomegaly Extremities: extremities normal, atraumatic, no cyanosis or edema  ECOG PERFORMANCE STATUS: 0 - Asymptomatic  Blood pressure 136/79, pulse 87, temperature 98.7 F (37.1 C), temperature source Oral, resp. rate 20, height 5\' 2"  (1.575 m), weight 177 lb 1.6 oz (80.3 kg), SpO2 100 %.  LABORATORY DATA: Lab Results  Component Value Date   WBC 7.8 01/04/2019   HGB 11.6 (L) 01/04/2019   HCT 37.9 01/04/2019   MCV 91.1 01/04/2019   PLT 232 01/04/2019      Chemistry      Component Value Date/Time   NA 142 01/04/2019 1048   K 4.1 01/04/2019 1048   CL 106 01/04/2019 1048   CO2 26 01/04/2019 1048   BUN 19 01/04/2019 1048   CREATININE 1.05 (H) 01/04/2019 1048      Component Value Date/Time   CALCIUM 9.5 01/04/2019 1048   ALKPHOS 87 01/04/2019 1048   AST 21 01/04/2019 1048   ALT 17 01/04/2019 1048   BILITOT 0.5 01/04/2019 1048       RADIOGRAPHIC STUDIES: Ct Chest W Contrast  Result Date: 01/04/2019 CLINICAL DATA:  Lung cancer, status post partial right lobectomy EXAM: CT CHEST WITH CONTRAST TECHNIQUE: Multidetector CT imaging of the chest was performed during intravenous contrast administration. CONTRAST:  16mL OMNIPAQUE IOHEXOL 300 MG/ML  SOLN COMPARISON:  06/26/2018 FINDINGS: Cardiovascular: The heart is normal in size. No pericardial effusion. No evidence of thoracic aortic aneurysm. Atherosclerotic calcifications of the aortic arch. Coronary atherosclerosis of the LAD and right coronary artery. Mediastinum/Nodes: No suspicious mediastinal lymphadenopathy. Visualized thyroid is unremarkable. Lungs/Pleura: Status post right upper  lobectomy. No suspicious pulmonary nodules. Mild subpleural reticulation in the bilateral lower lobes. No focal consolidation. No pleural effusion or pneumothorax. Upper Abdomen: Visualized upper abdomen is notable for a moderate hiatal hernia. Vascular calcifications. Musculoskeletal: Degenerative changes of the visualized thoracolumbar spine. IMPRESSION: Status post right upper lobectomy. No evidence of recurrent or metastatic disease. Aortic Atherosclerosis (ICD10-I70.0). Electronically Signed   By: Julian Hy M.D.   On: 01/04/2019 13:48    ASSESSMENT AND PLAN: This is a very pleasant 73 years old white female with stage IA non-small cell lung cancer status post right upper lobectomy with lymph node dissection.   The patient is currently on observation and she is feeling fine. She had repeat CT scan of the chest performed recently.  I personally and independently reviewed the scans and discussed the results with the patient. Her scan showed no concerning findings for disease recurrence or metastasis. I recommended for her to continue on observation with repeat CT scan of the chest in 6 months. She was advised to call immediately  if she has any concerning symptoms in the interval. The patient voices understanding of current disease status and treatment options and is in agreement with the current care plan. All questions were answered. The patient knows to call the clinic with any problems, questions or concerns. We can certainly see the patient much sooner if necessary. I spent 10 minutes counseling the patient face to face. The total time spent in the appointment was 15 minutes.  Disclaimer: This note was dictated with voice recognition software. Similar sounding words can inadvertently be transcribed and may not be corrected upon review.

## 2019-01-07 ENCOUNTER — Telehealth: Payer: Self-pay | Admitting: Internal Medicine

## 2019-01-07 NOTE — Telephone Encounter (Signed)
Scheduled apt per 3/4 los - sent reminder letter int he mail with appt date and time

## 2019-01-18 ENCOUNTER — Other Ambulatory Visit: Payer: Self-pay

## 2019-01-18 ENCOUNTER — Ambulatory Visit (INDEPENDENT_AMBULATORY_CARE_PROVIDER_SITE_OTHER): Payer: Medicare HMO | Admitting: *Deleted

## 2019-01-18 DIAGNOSIS — I639 Cerebral infarction, unspecified: Secondary | ICD-10-CM

## 2019-01-19 LAB — CUP PACEART REMOTE DEVICE CHECK
Date Time Interrogation Session: 20200314174140
MDC IDC PG IMPLANT DT: 20170919

## 2019-01-25 NOTE — Progress Notes (Signed)
[facility] Summary Report / Loop Recorder 

## 2019-02-18 ENCOUNTER — Other Ambulatory Visit: Payer: Self-pay

## 2019-02-18 ENCOUNTER — Ambulatory Visit (INDEPENDENT_AMBULATORY_CARE_PROVIDER_SITE_OTHER): Payer: Medicare HMO | Admitting: *Deleted

## 2019-02-18 DIAGNOSIS — I639 Cerebral infarction, unspecified: Secondary | ICD-10-CM

## 2019-02-18 LAB — CUP PACEART REMOTE DEVICE CHECK
Date Time Interrogation Session: 20200416162351
Implantable Pulse Generator Implant Date: 20170919

## 2019-02-24 NOTE — Progress Notes (Signed)
[facility] Summary Report / Loop Recorder 

## 2019-03-23 ENCOUNTER — Ambulatory Visit (INDEPENDENT_AMBULATORY_CARE_PROVIDER_SITE_OTHER): Payer: Medicare HMO | Admitting: *Deleted

## 2019-03-23 ENCOUNTER — Other Ambulatory Visit: Payer: Self-pay

## 2019-03-23 DIAGNOSIS — I639 Cerebral infarction, unspecified: Secondary | ICD-10-CM | POA: Diagnosis not present

## 2019-03-24 LAB — CUP PACEART REMOTE DEVICE CHECK
Date Time Interrogation Session: 20200519181044
Implantable Pulse Generator Implant Date: 20170919

## 2019-03-25 ENCOUNTER — Other Ambulatory Visit: Payer: Self-pay | Admitting: Internal Medicine

## 2019-04-01 NOTE — Progress Notes (Signed)
[facility] Summary Report / Loop Recorder 

## 2019-04-26 ENCOUNTER — Ambulatory Visit (INDEPENDENT_AMBULATORY_CARE_PROVIDER_SITE_OTHER): Payer: Medicare HMO | Admitting: *Deleted

## 2019-04-26 DIAGNOSIS — G459 Transient cerebral ischemic attack, unspecified: Secondary | ICD-10-CM

## 2019-04-26 LAB — CUP PACEART REMOTE DEVICE CHECK
Date Time Interrogation Session: 20200621180842
Implantable Pulse Generator Implant Date: 20170919

## 2019-05-03 ENCOUNTER — Other Ambulatory Visit: Payer: Self-pay | Admitting: Internal Medicine

## 2019-05-03 NOTE — Progress Notes (Signed)
[facility] Summary Report / Loop Recorder 

## 2019-05-17 ENCOUNTER — Telehealth: Payer: Self-pay | Admitting: Internal Medicine

## 2019-05-17 MED ORDER — PANTOPRAZOLE SODIUM 40 MG PO TBEC: 40.0000 mg | DELAYED_RELEASE_TABLET | Freq: Every day | ORAL | 1 refills | Status: DC

## 2019-05-17 NOTE — Telephone Encounter (Signed)
Medication Refill - Medication: pantoprazole (PROTONIX) 40 MG tablet [[id number]]    Has the patient contacted their pharmacy? No. (Agent: If no, request that the patient contact the pharmacy for the refill.) The patient would like to either up the dosage or change the medication. The patient is still vomiting.    Preferred Pharmacy (with phone number or street name):  [facility] - [place], [place] - [address] [phone] (Phone) [phone] (Fax)     Agent: Please be advised that RX refills may take up to 3 business days. We ask that you follow-up with your pharmacy.

## 2019-05-28 ENCOUNTER — Ambulatory Visit (INDEPENDENT_AMBULATORY_CARE_PROVIDER_SITE_OTHER): Payer: Medicare HMO | Admitting: *Deleted

## 2019-05-28 DIAGNOSIS — I480 Paroxysmal atrial fibrillation: Secondary | ICD-10-CM

## 2019-05-28 DIAGNOSIS — G459 Transient cerebral ischemic attack, unspecified: Secondary | ICD-10-CM

## 2019-05-29 LAB — CUP PACEART REMOTE DEVICE CHECK
Date Time Interrogation Session: 20200724183935
Implantable Pulse Generator Implant Date: 20170919

## 2019-06-03 ENCOUNTER — Other Ambulatory Visit: Payer: Self-pay | Admitting: Internal Medicine

## 2019-06-03 NOTE — Progress Notes (Signed)
[facility] Summary Report / Loop Recorder 

## 2019-06-28 ENCOUNTER — Other Ambulatory Visit: Payer: Self-pay | Admitting: Internal Medicine

## 2019-06-29 ENCOUNTER — Telehealth: Payer: Self-pay

## 2019-06-29 NOTE — Telephone Encounter (Signed)
Patient is coming for flu shot tomorrow (nurse visit)---she has had both pneumonia vaccines in 2017 and 2019, however, she only has one lung and was wanting to know if dr Jenny Reichmann recommends she start the series over now---with prevnar 13 vaccine being given tomorrow with flu shot---please advise, I will let patient know at her appt tomorrow, thanks

## 2019-06-29 NOTE — Telephone Encounter (Signed)
No need for further treatment, as booster shots are not needed for these, and having one lung does not increase her risk for pna all by itself

## 2019-06-29 NOTE — Telephone Encounter (Signed)
Patient to be advised at appt tomorrow with nurse visit

## 2019-06-30 ENCOUNTER — Ambulatory Visit (INDEPENDENT_AMBULATORY_CARE_PROVIDER_SITE_OTHER): Payer: Medicare HMO | Admitting: *Deleted

## 2019-06-30 ENCOUNTER — Ambulatory Visit (INDEPENDENT_AMBULATORY_CARE_PROVIDER_SITE_OTHER): Payer: Medicare HMO

## 2019-06-30 DIAGNOSIS — Z23 Encounter for immunization: Secondary | ICD-10-CM | POA: Diagnosis not present

## 2019-06-30 DIAGNOSIS — I6389 Other cerebral infarction: Secondary | ICD-10-CM | POA: Diagnosis not present

## 2019-06-30 DIAGNOSIS — I639 Cerebral infarction, unspecified: Secondary | ICD-10-CM

## 2019-06-30 LAB — CUP PACEART REMOTE DEVICE CHECK
Date Time Interrogation Session: 20200826175656
Implantable Pulse Generator Implant Date: 20170919

## 2019-07-08 NOTE — Progress Notes (Signed)
[facility] Summary Report / Loop Recorder 

## 2019-07-09 ENCOUNTER — Inpatient Hospital Stay: Payer: Medicare HMO | Attending: Internal Medicine

## 2019-07-09 ENCOUNTER — Other Ambulatory Visit: Payer: Self-pay

## 2019-07-09 ENCOUNTER — Encounter (HOSPITAL_COMMUNITY): Payer: Self-pay

## 2019-07-09 ENCOUNTER — Ambulatory Visit (HOSPITAL_COMMUNITY)
Admission: RE | Admit: 2019-07-09 | Discharge: 2019-07-09 | Disposition: A | Discharge status: Home / Self Care | Payer: Medicare HMO | Source: Ambulatory Visit | Attending: Internal Medicine | Admitting: Internal Medicine

## 2019-07-09 DIAGNOSIS — Z85118 Personal history of other malignant neoplasm of bronchus and lung: Secondary | ICD-10-CM | POA: Diagnosis present

## 2019-07-09 DIAGNOSIS — R911 Solitary pulmonary nodule: Secondary | ICD-10-CM | POA: Diagnosis not present

## 2019-07-09 DIAGNOSIS — C3490 Malignant neoplasm of unspecified part of unspecified bronchus or lung: Secondary | ICD-10-CM

## 2019-07-09 LAB — CMP (CANCER CENTER ONLY)
ALT: 18 U/L (ref 0–44)
AST: 17 U/L (ref 15–41)
Albumin: 3.6 g/dL (ref 3.5–5.0)
Alkaline Phosphatase: 108 U/L (ref 38–126)
Anion gap: 9 (ref 5–15)
BUN: 10 mg/dL (ref 8–23)
CO2: 23 mmol/L (ref 22–32)
Calcium: 9.1 mg/dL (ref 8.9–10.3)
Chloride: 109 mmol/L (ref 98–111)
Creatinine: 0.86 mg/dL (ref 0.44–1.00)
GFR, Est AFR Am: >60 mL/min (ref >60)
GFR, Estimated: >60 mL/min (ref >60)
Glucose, Bld: 105 mg/dL — ABNORMAL HIGH (ref 70–99)
Potassium: 3.6 mmol/L (ref 3.5–5.1)
Sodium: 141 mmol/L (ref 135–145)
Total Bilirubin: 0.5 mg/dL (ref 0.3–1.2)
Total Protein: 7.6 g/dL (ref 6.5–8.1)

## 2019-07-09 LAB — CBC WITH DIFFERENTIAL (CANCER CENTER ONLY)
Abs Immature Granulocytes: 0.1 10*3/uL — ABNORMAL HIGH (ref 0.00–0.07)
Basophils Absolute: 0.1 10*3/uL (ref 0.0–0.1)
Basophils Relative: 2 %
Eosinophils Absolute: 0.1 10*3/uL (ref 0.0–0.5)
Eosinophils Relative: 1 %
HCT: 41 % (ref 36.0–46.0)
Hemoglobin: 12.6 g/dL (ref 12.0–15.0)
Immature Granulocytes: 2 %
Lymphocytes Relative: 27 %
Lymphs Abs: 1.7 10*3/uL (ref 0.7–4.0)
MCH: 25.4 pg — ABNORMAL LOW (ref 26.0–34.0)
MCHC: 30.7 g/dL (ref 30.0–36.0)
MCV: 82.5 fL (ref 80.0–100.0)
Monocytes Absolute: 0.6 10*3/uL (ref 0.1–1.0)
Monocytes Relative: 10 %
Neutro Abs: 3.7 10*3/uL (ref 1.7–7.7)
Neutrophils Relative %: 58 %
Platelet Count: 259 10*3/uL (ref 150–400)
RBC: 4.97 MIL/uL (ref 3.87–5.11)
RDW: 17.4 % — ABNORMAL HIGH (ref 11.5–15.5)
WBC Count: 6.4 10*3/uL (ref 4.0–10.5)
nRBC: 0 % (ref 0.0–0.2)

## 2019-07-09 MED ORDER — SODIUM CHLORIDE (PF) 0.9 % IJ SOLN
INTRAMUSCULAR | Status: AC
  Filled 2019-07-09: qty 50

## 2019-07-09 MED ORDER — IOHEXOL 300 MG/ML  SOLN
75.0000 mL | Freq: Once | INTRAMUSCULAR | Status: AC | PRN
  Administered 2019-07-09: 75 mL via INTRAVENOUS

## 2019-07-13 ENCOUNTER — Other Ambulatory Visit: Payer: Self-pay

## 2019-07-13 ENCOUNTER — Encounter: Payer: Self-pay | Admitting: Internal Medicine

## 2019-07-13 ENCOUNTER — Inpatient Hospital Stay (HOSPITAL_BASED_OUTPATIENT_CLINIC_OR_DEPARTMENT_OTHER): Payer: Medicare HMO | Admitting: Internal Medicine

## 2019-07-13 VITALS — BP 126/71 | HR 65 | Temp 98.0°F | Resp 18 | Ht 62.0 in | Wt 191.8 lb

## 2019-07-13 DIAGNOSIS — C3491 Malignant neoplasm of unspecified part of right bronchus or lung: Secondary | ICD-10-CM | POA: Diagnosis not present

## 2019-07-13 DIAGNOSIS — I10 Essential (primary) hypertension: Secondary | ICD-10-CM

## 2019-07-13 DIAGNOSIS — C3490 Malignant neoplasm of unspecified part of unspecified bronchus or lung: Secondary | ICD-10-CM

## 2019-07-13 DIAGNOSIS — Z85118 Personal history of other malignant neoplasm of bronchus and lung: Secondary | ICD-10-CM | POA: Diagnosis not present

## 2019-07-13 NOTE — Progress Notes (Signed)
[facility] [phone]   [phone]  OFFICE PROGRESS NOTE  Biagio Borg, MD [place] [address]  DIAGNOSIS: Stage IA (T1b, N0, M0) non-small cell lung cancer, adenocarcinoma presented with right upper lobe pulmonary nodule   PRIOR THERAPY: Status post right upper lobectomy with lymph node dissection under the care of Dr. Roxan Hockey on July 30, 2017.  CURRENT THERAPY: Observation.  INTERVAL HISTORY: Kathleen Guerrero 73 y.o. female returns to the clinic today for 6 months follow-up visit.  The patient is feeling fine today with no concerning complaints.  She denied having any chest pain, shortness of breath, cough or hemoptysis.  She denied having any fever or chills.  She has no nausea, vomiting, diarrhea or constipation.  She gained several pounds since her last visit.  The patient had repeat CT scan of the chest performed recently and she is here for evaluation and discussion of her scan results.  MEDICAL HISTORY: Past Medical History:  Diagnosis Date   Arthritis    GERD (gastroesophageal reflux disease)    tums   Headache    Hepatitis    cmv 30 yrs ago hepatitis   History of hiatal hernia    History of loop recorder    after stroke   Hypertension    Stroke ([facility] [facility]) 07/2016   balance a little off   Tobacco use     ALLERGIES:  has No Known Allergies.  MEDICATIONS:  Current Outpatient Medications  Medication Sig Dispense Refill   acetaminophen (TYLENOL) 325 MG tablet Take 650 mg by mouth daily as needed for mild pain or headache.     amiodarone (PACERONE) 200 MG tablet Take 1 tablet (200 mg total) by mouth daily. 30 tablet 0   amLODipine (NORVASC) 10 MG tablet TAKE 1 TABLET BY MOUTH EVERY DAY 90 tablet 3   aspirin 325 MG tablet Take 1 tablet (325 mg total) by mouth daily. 30 tablet 11   atorvastatin (LIPITOR) 40 MG tablet TAKE 1 TABLET BY MOUTH EVERY DAY AT 6 PM 90 tablet 1   calcium  carbonate (TUMS - DOSED IN MG ELEMENTAL CALCIUM) 500 MG chewable tablet Chew 1 tablet by mouth 4 (four) times daily as needed for indigestion or heartburn.     cloNIDine (CATAPRES) 0.1 MG tablet Take 1 tablet (0.1 mg total) by mouth 2 (two) times daily. 60 tablet 11   escitalopram (LEXAPRO) 10 MG tablet TAKE 1 TABLET BY MOUTH EVERY DAY 90 tablet 1   metoprolol tartrate (LOPRESSOR) 25 MG tablet TAKE 1/2 TABLET BY MOUTH 2 TIMES DAILY 60 tablet 5   ondansetron (ZOFRAN) 4 MG tablet Take 1 tablet (4 mg total) by mouth every 8 (eight) hours as needed for nausea or vomiting. 20 tablet 0   pantoprazole (PROTONIX) 40 MG tablet TAKE 1 TABLET BY MOUTH EVERY DAY 90 tablet 1   No current facility-administered medications for this visit.     SURGICAL HISTORY:  Past Surgical History:  Procedure Laterality Date   APPENDECTOMY     DERMOID CYST  EXCISION     x2 55'   EP IMPLANTABLE DEVICE N/A 07/23/2016   Procedure: Loop Recorder Insertion;  Surgeon: Thompson Grayer, MD;  Location: [facility];  Service: Cardiovascular;  Laterality: N/A;   Intestinal obstruction  1979   LOBECTOMY Right 07/30/2017   Procedure: LOBECTOMY;  Surgeon: [name], MD;  Location: [facility];  Service: Thoracic;  Laterality: Right;  TEE WITHOUT CARDIOVERSION N/A 07/23/2016   Procedure: TRANSESOPHAGEAL ECHOCARDIOGRAM (TEE);  Surgeon: Lelon Perla, MD;  Location: [facility] ENDOSCOPY;  Service: Cardiovascular;  Laterality: N/A;   VIDEO ASSISTED THORACOSCOPY (VATS)/WEDGE RESECTION Right 07/30/2017   Procedure: VIDEO ASSISTED THORACOSCOPY (VATS)/WEDGE RESECTION;  Surgeon: [name], MD;  Location: [facility] OR;  Service: Thoracic;  Laterality: Right;    REVIEW OF SYSTEMS:  A comprehensive review of systems was negative.   PHYSICAL EXAMINATION: General appearance: alert, cooperative and no distress Head: Normocephalic, without obvious abnormality, atraumatic Neck: no adenopathy, no JVD, supple, symmetrical,  trachea midline and thyroid not enlarged, symmetric, no tenderness/mass/nodules Lymph nodes: Cervical, supraclavicular, and axillary nodes normal. Resp: clear to auscultation bilaterally Back: symmetric, no curvature. ROM normal. No CVA tenderness. Cardio: regular rate and rhythm, S1, S2 normal, no murmur, click, rub or gallop GI: soft, non-tender; bowel sounds normal; no masses,  no organomegaly Extremities: extremities normal, atraumatic, no cyanosis or edema  ECOG PERFORMANCE STATUS: 0 - Asymptomatic  Blood pressure 126/71, pulse 65, temperature 98 F (36.7 C), temperature source Oral, resp. rate 18, height 5\' 2"  (1.575 m), weight 191 lb 12.8 oz (87 kg), SpO2 98 %.  LABORATORY DATA: Lab Results  Component Value Date   WBC 6.4 07/09/2019   HGB 12.6 07/09/2019   HCT 41.0 07/09/2019   MCV 82.5 07/09/2019   PLT 259 07/09/2019      Chemistry      Component Value Date/Time   NA 141 07/09/2019 1007   K 3.6 07/09/2019 1007   CL 109 07/09/2019 1007   CO2 23 07/09/2019 1007   BUN 10 07/09/2019 1007   CREATININE 0.86 07/09/2019 1007      Component Value Date/Time   CALCIUM 9.1 07/09/2019 1007   ALKPHOS 108 07/09/2019 1007   AST 17 07/09/2019 1007   ALT 18 07/09/2019 1007   BILITOT 0.5 07/09/2019 1007       RADIOGRAPHIC STUDIES: Ct Chest W Contrast  Result Date: 07/09/2019 CLINICAL DATA:  Restaging non-small cell lung cancer. Status post right partial lobectomy. EXAM: CT CHEST WITH CONTRAST TECHNIQUE: Multidetector CT imaging of the chest was performed during intravenous contrast administration. CONTRAST:  34mL OMNIPAQUE IOHEXOL 300 MG/ML  SOLN COMPARISON:  01/04/2019 FINDINGS: Cardiovascular: Normal heart size. Aortic atherosclerosis. Lad coronary artery calcification noted. Mediastinum/Nodes: Normal appearance of the thyroid gland. The trachea appears patent and is midline. Moderate hiatal hernia. No enlarged mediastinal or hilar lymph nodes. Lungs/Pleura: No pleural effusion  identified. There are postsurgical changes from a right upper lobectomy. No complications. No suspicious pulmonary nodule. Upper Abdomen: 4 mm stone within the left kidney noted. No acute findings noted within the upper abdomen. Musculoskeletal: Spondylosis within the thoracic spine. No acute or suspicious bone lesions. IMPRESSION: 1. Status post right upper lobectomy. No findings of recurrent or metastatic disease. 2.  Aortic Atherosclerosis (ICD10-I70.0). Electronically Signed   By: Kerby Moors M.D.   On: 07/09/2019 12:51    ASSESSMENT AND PLAN: This is a very pleasant 73 years old white female with stage IA non-small cell lung cancer status post right upper lobectomy with lymph node dissection.   The patient is currently on observation and she is feeling fine with no concerning complaints. She had repeat CT scan of the chest performed recently.  I personally and independently reviewed the scans and discussed the results with the patient today. Her scan showed no concerning findings for disease recurrence. I recommended for the patient to continue on observation with repeat CT scan  of the chest in 1 year. She was advised to call immediately if she has any concerning symptoms in the interval. The patient voices understanding of current disease status and treatment options and is in agreement with the current care plan. All questions were answered. The patient knows to call the clinic with any problems, questions or concerns. We can certainly see the patient much sooner if necessary. I spent 10 minutes counseling the patient face to face. The total time spent in the appointment was 15 minutes.  Disclaimer: This note was dictated with voice recognition software. Similar sounding words can inadvertently be transcribed and may not be corrected upon review.

## 2019-07-14 ENCOUNTER — Telehealth: Payer: Self-pay | Admitting: Internal Medicine

## 2019-07-14 NOTE — Telephone Encounter (Signed)
Scheduled appt per 9/8 los - mailed letter with appt date and time   

## 2019-08-02 ENCOUNTER — Ambulatory Visit (INDEPENDENT_AMBULATORY_CARE_PROVIDER_SITE_OTHER): Payer: Medicare HMO | Admitting: *Deleted

## 2019-08-02 DIAGNOSIS — I480 Paroxysmal atrial fibrillation: Secondary | ICD-10-CM | POA: Diagnosis not present

## 2019-08-02 DIAGNOSIS — G459 Transient cerebral ischemic attack, unspecified: Secondary | ICD-10-CM

## 2019-08-03 LAB — CUP PACEART REMOTE DEVICE CHECK
Date Time Interrogation Session: 20200929172935
Implantable Pulse Generator Implant Date: 20170919

## 2019-08-11 NOTE — Progress Notes (Signed)
[facility] Summary Report / Loop Recorder 

## 2019-09-05 LAB — CUP PACEART REMOTE DEVICE CHECK
Date Time Interrogation Session: 20201101172617
Implantable Pulse Generator Implant Date: 20170919

## 2019-09-06 ENCOUNTER — Ambulatory Visit (INDEPENDENT_AMBULATORY_CARE_PROVIDER_SITE_OTHER): Payer: Medicare HMO | Admitting: *Deleted

## 2019-09-06 DIAGNOSIS — I480 Paroxysmal atrial fibrillation: Secondary | ICD-10-CM

## 2019-09-13 ENCOUNTER — Other Ambulatory Visit: Payer: Self-pay | Admitting: Internal Medicine

## 2019-09-16 ENCOUNTER — Other Ambulatory Visit: Payer: Self-pay

## 2019-09-16 ENCOUNTER — Ambulatory Visit (INDEPENDENT_AMBULATORY_CARE_PROVIDER_SITE_OTHER): Payer: Medicare HMO | Admitting: Internal Medicine

## 2019-09-16 ENCOUNTER — Encounter: Payer: Self-pay | Admitting: Internal Medicine

## 2019-09-16 VITALS — BP 136/84 | HR 100 | Temp 98.6°F | Ht 62.0 in | Wt 192.0 lb

## 2019-09-16 DIAGNOSIS — E559 Vitamin D deficiency, unspecified: Secondary | ICD-10-CM | POA: Diagnosis not present

## 2019-09-16 DIAGNOSIS — R739 Hyperglycemia, unspecified: Secondary | ICD-10-CM

## 2019-09-16 DIAGNOSIS — E611 Iron deficiency: Secondary | ICD-10-CM | POA: Diagnosis not present

## 2019-09-16 DIAGNOSIS — Z0000 Encounter for general adult medical examination without abnormal findings: Secondary | ICD-10-CM | POA: Diagnosis not present

## 2019-09-16 DIAGNOSIS — E538 Deficiency of other specified B group vitamins: Secondary | ICD-10-CM

## 2019-09-16 NOTE — Assessment & Plan Note (Signed)
stable overall by history and exam, recent data reviewed with pt, and pt to continue medical treatment as before,  to f/u any worsening symptoms or concerns  

## 2019-09-16 NOTE — Progress Notes (Signed)
Subjective:    Patient ID: Kathleen Guerrero, female    DOB: February 13, 1946, 73 y.o.   MRN: [id number]  HPI  Here for wellness and f/u;  Overall doing ok;  Pt denies Chest pain, worsening SOB, DOE, wheezing, orthopnea, PND, worsening LE edema, palpitations, dizziness or syncope.  Pt denies neurological change such as new headache, facial or extremity weakness.  Pt denies polydipsia, polyuria, or low sugar symptoms. Pt states overall good compliance with treatment and medications, good tolerability, and has been trying to follow appropriate diet.  Pt denies worsening depressive symptoms, suicidal ideation or panic. No fever, night sweats, wt loss, loss of appetite, or other constitutional symptoms.  Pt states good ability with ADL's, has low fall risk, home safety reviewed and adequate, no other significant changes in hearing or vision, and only occasionally active with exercise. Cousin died recently, sad..  No new complaints Past Medical History:  Diagnosis Date  . Arthritis   . GERD (gastroesophageal reflux disease)    tums  . Headache   . Hepatitis    cmv 30 yrs ago hepatitis  . History of hiatal hernia   . History of loop recorder    after stroke  . Hypertension   . Stroke ([place]) 07/2016   balance a little off  . Tobacco use    Past Surgical History:  Procedure Laterality Date  . APPENDECTOMY    . DERMOID CYST  EXCISION     x2 8'  . EP IMPLANTABLE DEVICE N/A 07/23/2016   Procedure: Loop Recorder Insertion;  Surgeon: Thompson Grayer, MD;  Location: [facility];  Service: Cardiovascular;  Laterality: N/A;  . Intestinal obstruction  1979  . LOBECTOMY Right 07/30/2017   Procedure: LOBECTOMY;  Surgeon: [name], MD;  Location: [facility];  Service: Thoracic;  Laterality: Right;  . TEE WITHOUT CARDIOVERSION N/A 07/23/2016   Procedure: TRANSESOPHAGEAL ECHOCARDIOGRAM (TEE);  Surgeon: Lelon Perla, MD;  Location: [facility] ENDOSCOPY;  Service: Cardiovascular;  Laterality: N/A;  . VIDEO  ASSISTED THORACOSCOPY (VATS)/WEDGE RESECTION Right 07/30/2017   Procedure: VIDEO ASSISTED THORACOSCOPY (VATS)/WEDGE RESECTION;  Surgeon: [name], MD;  Location: [facility];  Service: Thoracic;  Laterality: Right;    reports that she quit smoking about 3 years ago. Her smoking use included cigarettes. She has a 50.00 pack-year smoking history. She has never used smokeless tobacco. She reports current alcohol use. She reports that she does not use drugs. family history includes Cancer in her maternal grandfather; Kidney disease in her maternal grandmother. No Known Allergies Current Outpatient Medications on File Prior to Visit  Medication Sig Dispense Refill  . acetaminophen (TYLENOL) 325 MG tablet Take 650 mg by mouth daily as needed for mild pain or headache.    Marland Kitchen amiodarone (PACERONE) 200 MG tablet Take 1 tablet (200 mg total) by mouth daily. 30 tablet 0  . amLODipine (NORVASC) 10 MG tablet TAKE 1 TABLET BY MOUTH EVERY DAY 90 tablet 3  . aspirin 325 MG tablet Take 1 tablet (325 mg total) by mouth daily. 30 tablet 11  . atorvastatin (LIPITOR) 40 MG tablet TAKE 1 TABLET BY MOUTH EVERY DAY AT 6 PM 90 tablet 1  . calcium carbonate (TUMS - DOSED IN MG ELEMENTAL CALCIUM) 500 MG chewable tablet Chew 1 tablet by mouth 4 (four) times daily as needed for indigestion or heartburn.    . cloNIDine (CATAPRES) 0.1 MG tablet Take 1 tablet (0.1 mg total) by mouth 2 (two) times daily. 60 tablet 11  .  escitalopram (LEXAPRO) 10 MG tablet TAKE 1 TABLET BY MOUTH EVERY DAY 90 tablet 1  . metoprolol tartrate (LOPRESSOR) 25 MG tablet TAKE 1/2 TABLET BY MOUTH 2 TIMES DAILY 60 tablet 5  . ondansetron (ZOFRAN) 4 MG tablet Take 1 tablet (4 mg total) by mouth every 8 (eight) hours as needed for nausea or vomiting. 20 tablet 0  . pantoprazole (PROTONIX) 40 MG tablet TAKE 1 TABLET BY MOUTH EVERY DAY 90 tablet 1   No current facility-administered medications on file prior to visit.    Review of Systems  Constitutional: Negative for other unusual diaphoresis, sweats, appetite or weight changes HENT: Negative for other worsening hearing loss, ear pain, facial swelling, mouth sores or neck stiffness.   Eyes: Negative for other worsening pain, redness or other visual disturbance.  Respiratory: Negative for other stridor or swelling Cardiovascular: Negative for other palpitations or other chest pain  Gastrointestinal: Negative for worsening diarrhea or loose stools, blood in stool, distention or other pain Genitourinary: Negative for hematuria, flank pain or other change in urine volume.  Musculoskeletal: Negative for myalgias or other joint swelling.  Skin: Negative for other color change, or other wound or worsening drainage.  Neurological: Negative for other syncope or numbness. Hematological: Negative for other adenopathy or swelling Psychiatric/Behavioral: Negative for hallucinations, other worsening agitation, SI, self-injury, or new decreased concentration All otherwise neg per pt     Objective:   Physical Exam BP 136/84   Pulse 100   Temp 98.6 F (37 C) (Oral)   Ht 5\' 2"  (1.575 m)   Wt 192 lb (87.1 kg)   SpO2 95%   BMI 35.12 kg/m  VS noted,  Constitutional: Pt is oriented to person, place, and time. Appears well-developed and well-nourished, in no significant distress and comfortable Head: Normocephalic and atraumatic  Eyes: Conjunctivae and EOM are normal. Pupils are equal, round, and reactive to light Right Ear: External ear normal without discharge Left Ear: External ear normal without discharge Nose: Nose without discharge or deformity Mouth/Throat: Oropharynx is without other ulcerations and moist  Neck: Normal range of motion. Neck supple. No JVD present. No tracheal deviation present or significant neck LA or mass Cardiovascular: Normal rate, regular rhythm, normal heart sounds and intact distal pulses.   Pulmonary/Chest: WOB normal and breath sounds without rales or  wheezing  Abdominal: Soft. Bowel sounds are normal. NT. No HSM  Musculoskeletal: Normal range of motion. Exhibits no edema Lymphadenopathy: Has no other cervical adenopathy.  Neurological: Pt is alert and oriented to person, place, and time. Pt has normal reflexes. No cranial nerve deficit. Motor grossly intact, Gait intact Skin: Skin is warm and dry. No rash noted or new ulcerations Psychiatric:  Has normal mood and affect. Behavior is normal without agitation All otherwise neg per pt Lab Results  Component Value Date   WBC 6.4 07/09/2019   HGB 12.6 07/09/2019   HCT 41.0 07/09/2019   PLT 259 07/09/2019   GLUCOSE 105 (H) 07/09/2019   CHOL 97 09/10/2018   TRIG 118.0 09/10/2018   HDL 45.00 09/10/2018   LDLCALC 28 09/10/2018   ALT 18 07/09/2019   AST 17 07/09/2019   NA 141 07/09/2019   K 3.6 07/09/2019   CL 109 07/09/2019   CREATININE 0.86 07/09/2019   BUN 10 07/09/2019   CO2 23 07/09/2019   TSH 1.56 09/10/2018   INR 0.99 07/28/2017   HGBA1C 5.6 09/10/2018      Assessment & Plan:

## 2019-09-16 NOTE — Patient Instructions (Signed)
Please continue all other medications as before, and refills have been done if requested.  Please have the pharmacy call with any other refills you may need.  Please continue your efforts at being more active, low cholesterol diet, and weight control.  You are otherwise up to date with prevention measures today.  Please keep your appointments with your specialists as you may have planned  Please go to the LAB in the Basement (turn left off the elevator) for the tests to be done   You will be contacted by phone if any changes need to be made immediately.  Otherwise, you will receive a letter about your results with an explanation, but please check with MyChart first.  Please remember to sign up for MyChart if you have not done so, as this will be important to you in the future with finding out test results, communicating by private email, and scheduling acute appointments online when needed.  Please return in 1 year for your yearly visit, or sooner if needed

## 2019-09-16 NOTE — Assessment & Plan Note (Signed)

## 2019-09-28 NOTE — Progress Notes (Signed)
[facility] Summary Report / Loop Recorder 

## 2019-10-08 ENCOUNTER — Ambulatory Visit (INDEPENDENT_AMBULATORY_CARE_PROVIDER_SITE_OTHER): Payer: Medicare HMO | Admitting: *Deleted

## 2019-10-08 DIAGNOSIS — I639 Cerebral infarction, unspecified: Secondary | ICD-10-CM

## 2019-10-09 LAB — CUP PACEART REMOTE DEVICE CHECK
Date Time Interrogation Session: 20201204122630
Implantable Pulse Generator Implant Date: 20170919

## 2019-10-12 ENCOUNTER — Other Ambulatory Visit: Payer: Self-pay | Admitting: Internal Medicine

## 2019-11-08 ENCOUNTER — Other Ambulatory Visit: Payer: Self-pay | Admitting: Internal Medicine

## 2019-11-10 ENCOUNTER — Ambulatory Visit (INDEPENDENT_AMBULATORY_CARE_PROVIDER_SITE_OTHER): Payer: Medicare HMO | Admitting: *Deleted

## 2019-11-10 DIAGNOSIS — I639 Cerebral infarction, unspecified: Secondary | ICD-10-CM

## 2019-11-11 LAB — CUP PACEART REMOTE DEVICE CHECK
Date Time Interrogation Session: 20210106131200
Implantable Pulse Generator Implant Date: 20170919

## 2019-12-13 ENCOUNTER — Ambulatory Visit (INDEPENDENT_AMBULATORY_CARE_PROVIDER_SITE_OTHER): Payer: Medicare HMO | Admitting: *Deleted

## 2019-12-13 DIAGNOSIS — I639 Cerebral infarction, unspecified: Secondary | ICD-10-CM

## 2019-12-13 LAB — CUP PACEART REMOTE DEVICE CHECK
Date Time Interrogation Session: 20210207231439
Implantable Pulse Generator Implant Date: 20170919

## 2019-12-13 NOTE — Progress Notes (Signed)
ILR Remote 

## 2020-01-13 ENCOUNTER — Ambulatory Visit (INDEPENDENT_AMBULATORY_CARE_PROVIDER_SITE_OTHER): Payer: Medicare HMO | Admitting: *Deleted

## 2020-01-13 DIAGNOSIS — I639 Cerebral infarction, unspecified: Secondary | ICD-10-CM | POA: Diagnosis not present

## 2020-01-13 LAB — CUP PACEART REMOTE DEVICE CHECK
Date Time Interrogation Session: 20210310235727
Implantable Pulse Generator Implant Date: 20170919

## 2020-01-13 NOTE — Progress Notes (Signed)
ILR Remote 

## 2020-01-24 ENCOUNTER — Encounter: Payer: Self-pay | Admitting: Family

## 2020-01-24 ENCOUNTER — Ambulatory Visit (INDEPENDENT_AMBULATORY_CARE_PROVIDER_SITE_OTHER): Payer: Medicare HMO | Admitting: Family

## 2020-01-24 DIAGNOSIS — R197 Diarrhea, unspecified: Secondary | ICD-10-CM | POA: Diagnosis not present

## 2020-01-24 MED ORDER — METRONIDAZOLE 500 MG PO TABS: 500.0000 mg | ORAL_TABLET | Freq: Three times a day (TID) | ORAL | 0 refills | Status: DC

## 2020-01-24 NOTE — Progress Notes (Signed)
Kathleen Guerrero is a 74 y.o. female with the following history as recorded in EpicCare:  Patient Active Problem List   Diagnosis Date Noted  . GERD (gastroesophageal reflux disease) 11/12/2018  . Scalp lesion 03/09/2018  . Eczema 03/09/2018  . Adenocarcinoma of right lung, stage 1 ([place]) 12/29/2017  . Preventative health care 10/01/2017  . Hyperglycemia 10/01/2017  . AKI (acute kidney injury) ([place]) 09/24/2017  . Fatigue 09/23/2017  . Fall   . Recurrent major depressive disorder, in partial remission ([place])   . Acute lower UTI   . Labile blood pressure   . Essential hypertension   . Urinary frequency   . Hyponatremia   . Hypoalbuminemia due to protein-calorie malnutrition ([place])   . Incontinence of feces   . Critical illness myopathy 08/20/2017  . Debility   . Adenocarcinoma ([place])   . Anxiety and depression   . Agitation   . Dysphagia   . Acute blood loss anemia   . History of pneumothorax   . PAF (paroxysmal atrial fibrillation) ([place])   . History of CVA (cerebrovascular accident)   . Hyperlipidemia   . Hypernatremia   . Leukocytosis   . S/P lobectomy of lung 07/30/2017  . Lower extremity edema 04/30/2017  . TIA (transient ischemic attack) 01/16/2017  . Dyslipidemia, goal LDL below 70 01/03/2017  . Diarrhea 09/12/2016  . Sleep disturbance 07/26/2016  . Overactive bladder 07/26/2016  . Tobacco abuse 07/21/2016  . Solitary pulmonary nodule 07/21/2016  . Acute CVA (cerebrovascular accident) ([place]) 07/21/2016  . Acute lacunar stroke ([place]) 07/20/2016  . Hypokalemia 07/20/2016  . Benign essential HTN 07/20/2016    Current Outpatient Medications  Medication Sig Dispense Refill  . acetaminophen (TYLENOL) 325 MG tablet Take 650 mg by mouth daily as needed for mild pain or headache.    Marland Kitchen amiodarone (PACERONE) 200 MG tablet Take 1 tablet (200 mg total) by mouth daily. 30 tablet 0  . amLODipine (NORVASC) 10 MG tablet TAKE 1 TABLET BY MOUTH EVERY DAY 90 tablet 2  . aspirin 325 MG  tablet Take 1 tablet (325 mg total) by mouth daily. 30 tablet 11  . atorvastatin (LIPITOR) 40 MG tablet TAKE 1 TABLET BY MOUTH EVERY DAY AT 6 PM 90 tablet 1  . calcium carbonate (TUMS - DOSED IN MG ELEMENTAL CALCIUM) 500 MG chewable tablet Chew 1 tablet by mouth 4 (four) times daily as needed for indigestion or heartburn.    . cloNIDine (CATAPRES) 0.1 MG tablet Take 1 tablet (0.1 mg total) by mouth 2 (two) times daily. 60 tablet 11  . escitalopram (LEXAPRO) 10 MG tablet TAKE 1 TABLET BY MOUTH EVERY DAY 90 tablet 1  . metoprolol tartrate (LOPRESSOR) 25 MG tablet TAKE 1/2 TABLET BY MOUTH 2 TIMES DAILY 60 tablet 5  . metroNIDAZOLE (FLAGYL) 500 MG tablet Take 1 tablet (500 mg total) by mouth 3 (three) times daily. 21 tablet 0  . ondansetron (ZOFRAN) 4 MG tablet Take 1 tablet (4 mg total) by mouth every 8 (eight) hours as needed for nausea or vomiting. 20 tablet 0  . pantoprazole (PROTONIX) 40 MG tablet TAKE 1 TABLET BY MOUTH EVERY DAY 90 tablet 1   No current facility-administered medications for this visit.    Allergies: Patient has no known allergies.  Past Medical History:  Diagnosis Date  . Arthritis   . GERD (gastroesophageal reflux disease)    tums  . Headache   . Hepatitis    cmv 30 yrs ago hepatitis  . History  of hiatal hernia   . History of loop recorder    after stroke  . Hypertension   . Stroke ([place]) 07/2016   balance a little off  . Tobacco use     Past Surgical History:  Procedure Laterality Date  . APPENDECTOMY    . DERMOID CYST  EXCISION     x2 43'  . EP IMPLANTABLE DEVICE N/A 07/23/2016   Procedure: Loop Recorder Insertion;  Surgeon: Thompson Grayer, MD;  Location: [facility];  Service: Cardiovascular;  Laterality: N/A;  . Intestinal obstruction  1979  . LOBECTOMY Right 07/30/2017   Procedure: LOBECTOMY;  Surgeon: [name], MD;  Location: [facility];  Service: Thoracic;  Laterality: Right;  . TEE WITHOUT CARDIOVERSION N/A 07/23/2016   Procedure:  TRANSESOPHAGEAL ECHOCARDIOGRAM (TEE);  Surgeon: Lelon Perla, MD;  Location[facility] ENDOSCOPY;  Service: Cardiovascular;  Laterality: N/A;  . VIDEO ASSISTED THORACOSCOPY (VATS)/WEDGE RESECTION Right 07/30/2017   Procedure: VIDEO ASSISTED THORACOSCOPY (VATS)/WEDGE RESECTION;  Surgeon: [name], MD;  Location: [facility] OR;  Service: Thoracic;  Laterality: Right;    Family History  Problem Relation Age of Onset  . Kidney disease Maternal Grandmother   . Cancer Maternal Grandfather     Social History   Tobacco Use  . Smoking status: Former Smoker    Packs/day: 1.00    Years: 50.00    Pack years: 50.00    Types: Cigarettes    Quit date: 07/20/2016    Years since quitting: 3.5  . Smokeless tobacco: Never Used  Substance Use Topics  . Alcohol use: Yes    Comment: Rarely    Subjective:   I connected with Kathleen Guerrero on 01/24/20 at  2:00 PM EDT by a video enabled telemedicine application and verified that I am speaking with the correct person using two identifiers.   I discussed the limitations of evaluation and management by telemedicine and the availability of in person appointments. The patient expressed understanding and agreed to proceed. Provider in office/ patient is at home; provider and patient are only 2 people on video call.   Patient ate take out corned beef/ cabbage last Wednesday and started with diarrhea almost immediately; symptoms have been chronic with no relief from OTC Immodium; denies any blood in stool; notes her "stomach is sore" but no abdominal pain; no fever, no recent antibiotics; no vomiting- has been able to keep fluids down- trying to make a "homemade version of [place]"; is fully vaccinated for COVID;    Objective:  There were no vitals filed for this visit.  General: Well developed, well nourished, in no acute distress  Skin : Warm and dry.  Head: Normocephalic and atraumatic  Lungs: Respirations unlabored; Neurologic: Alert and oriented; speech  intact; face symmetrical;   Assessment:  1. Acute diarrhea     Plan:  Suspect food borne illness; will start Flagyl 500 mg tid x 7 days; BRAT diet, Gatorade discussed;  If no improvement in 24 hours, patient will need to have stool studies/ labs updated;  Follow-up to be determined.   No follow-ups on file.  No orders of the defined types were placed in this encounter.   Requested Prescriptions   Signed Prescriptions Disp Refills  . metroNIDAZOLE (FLAGYL) 500 MG tablet 21 tablet 0    Sig: Take 1 tablet (500 mg total) by mouth 3 (three) times daily.

## 2020-01-25 ENCOUNTER — Telehealth: Payer: Self-pay | Admitting: Family

## 2020-01-25 DIAGNOSIS — R197 Diarrhea, unspecified: Secondary | ICD-10-CM

## 2020-01-25 NOTE — Telephone Encounter (Signed)
Spoke with patient today; she does feel that she is already seeing improvement since starting the Flagyl; has only had 1 episode of diarrhea in the past 24 hours; is following BRAT diet and drinking Gatorade; if stools have not normalized ( still completely liquid) in the next 24-48 hours, she will call back; otherwise, complete medication and plan to see Dr. Jenny Reichmann as needed otherwise.

## 2020-01-27 NOTE — Telephone Encounter (Signed)
   Patient calling to report diarrhea is still issue, little improvement. Requesting call from Jodi Mourning.

## 2020-01-28 NOTE — Addendum Note (Signed)
Addended by: Sherlene Shams on: 01/28/2020 12:23 PM   Modules accepted: Orders

## 2020-01-28 NOTE — Telephone Encounter (Signed)
Will need to refer her to GI; In the interim, please schedule lab appointment for her if she can get here. She needs to get bloodwork and pick up stool cultures.

## 2020-01-31 ENCOUNTER — Telehealth: Payer: Self-pay

## 2020-01-31 NOTE — Telephone Encounter (Signed)
New message    The patient calls wanted to let MD know she is doing well with the medication metroNIDAZOLE (FLAGYL) 500 MG tablet.  Will discuss further when the nurses called back.

## 2020-02-01 NOTE — Telephone Encounter (Signed)
Called pt back to clarify msg pt states she just want to let Dr. Jenny Reichmann know that she is getting better. She will take her last dose of the Metronidazole today. She states her stool is not as liquid as before. If diarrhea states back she will make f/u w/provider.Marland KitchenJohny Chess

## 2020-02-10 ENCOUNTER — Other Ambulatory Visit: Payer: Self-pay | Admitting: Internal Medicine

## 2020-02-10 NOTE — Telephone Encounter (Signed)
Please refill as per office routine med refill policy (all routine meds refilled for 3 mo or monthly per pt preference up to one year from last visit, then month to month grace period for 3 mo, then further med refills will have to be denied)  

## 2020-02-13 LAB — CUP PACEART REMOTE DEVICE CHECK
Date Time Interrogation Session: 20210411024853
Implantable Pulse Generator Implant Date: 20170919

## 2020-02-14 ENCOUNTER — Ambulatory Visit (INDEPENDENT_AMBULATORY_CARE_PROVIDER_SITE_OTHER): Payer: Medicare HMO | Admitting: *Deleted

## 2020-02-14 DIAGNOSIS — I639 Cerebral infarction, unspecified: Secondary | ICD-10-CM | POA: Diagnosis not present

## 2020-02-14 NOTE — Progress Notes (Signed)
ILR Remote 

## 2020-03-15 ENCOUNTER — Telehealth: Payer: Self-pay | Admitting: *Deleted

## 2020-03-15 ENCOUNTER — Other Ambulatory Visit: Payer: Self-pay | Admitting: Internal Medicine

## 2020-03-15 NOTE — Telephone Encounter (Signed)
Done erx 

## 2020-03-15 NOTE — Telephone Encounter (Signed)
LMOVM (DPR) requesting call back to DC. Direct number and office hours provided. LINQ at RRT as of 03/14/20.

## 2020-03-18 LAB — CUP PACEART REMOTE DEVICE CHECK
Date Time Interrogation Session: 20210512000500
Implantable Pulse Generator Implant Date: 20170919

## 2020-03-20 ENCOUNTER — Ambulatory Visit (INDEPENDENT_AMBULATORY_CARE_PROVIDER_SITE_OTHER): Payer: Medicare HMO | Admitting: *Deleted

## 2020-03-20 DIAGNOSIS — I639 Cerebral infarction, unspecified: Secondary | ICD-10-CM | POA: Diagnosis not present

## 2020-03-20 NOTE — Progress Notes (Signed)
[facility] Summary Report / Loop Recorder 

## 2020-03-20 NOTE — Telephone Encounter (Signed)
LMOM to call office at device clinic . # provided.

## 2020-03-22 ENCOUNTER — Telehealth: Payer: Self-pay

## 2020-03-22 ENCOUNTER — Encounter: Payer: Self-pay | Admitting: *Deleted

## 2020-03-22 NOTE — Telephone Encounter (Signed)
The pt states she will call Raquel Sarna after 2:30 pm. Her phone blocks incoming calls.

## 2020-03-22 NOTE — Telephone Encounter (Signed)
Patient returned call. Discussed options for LINQ at RRT. Pt declines explant. She is aware she will receive a prepaid return kit in the mail. Advised to disregard letter that I sent earlier today when she receives it. Pt denies additional questions or concerns at this time.

## 2020-03-22 NOTE — Telephone Encounter (Signed)
Third attempt:  LMOVM requesting call back to DC. Direct number and office hours provided.  Letter mailed to patient advising of RRT status and options at this point. Unenrolled from [place], return kit ordered to home address on file.  No additional AF episodes noted since 08/04/17 (previously addressed).

## 2020-05-09 ENCOUNTER — Other Ambulatory Visit: Payer: Self-pay | Admitting: Internal Medicine

## 2020-05-09 NOTE — Telephone Encounter (Signed)
Please refill as per office routine med refill policy (all routine meds refilled for 3 mo or monthly per pt preference up to one year from last visit, then month to month grace period for 3 mo, then further med refills will have to be denied)  

## 2020-07-07 ENCOUNTER — Ambulatory Visit (HOSPITAL_COMMUNITY)
Admission: RE | Admit: 2020-07-07 | Discharge: 2020-07-07 | Disposition: A | Discharge status: Home / Self Care | Payer: Medicare HMO | Source: Ambulatory Visit | Attending: Internal Medicine | Admitting: Internal Medicine

## 2020-07-07 ENCOUNTER — Other Ambulatory Visit: Payer: Self-pay

## 2020-07-07 ENCOUNTER — Encounter (HOSPITAL_COMMUNITY): Payer: Self-pay

## 2020-07-07 ENCOUNTER — Inpatient Hospital Stay: Payer: Medicare HMO | Attending: Internal Medicine

## 2020-07-07 DIAGNOSIS — I700 Atherosclerosis of aorta: Secondary | ICD-10-CM | POA: Diagnosis not present

## 2020-07-07 DIAGNOSIS — K449 Diaphragmatic hernia without obstruction or gangrene: Secondary | ICD-10-CM | POA: Diagnosis not present

## 2020-07-07 DIAGNOSIS — C3490 Malignant neoplasm of unspecified part of unspecified bronchus or lung: Secondary | ICD-10-CM

## 2020-07-07 DIAGNOSIS — I2510 Atherosclerotic heart disease of native coronary artery without angina pectoris: Secondary | ICD-10-CM | POA: Diagnosis not present

## 2020-07-07 DIAGNOSIS — Z85118 Personal history of other malignant neoplasm of bronchus and lung: Secondary | ICD-10-CM | POA: Diagnosis not present

## 2020-07-07 LAB — CMP (CANCER CENTER ONLY)
ALT: 16 U/L (ref 0–44)
AST: 15 U/L (ref 15–41)
Albumin: 3.5 g/dL (ref 3.5–5.0)
Alkaline Phosphatase: 77 U/L (ref 38–126)
Anion gap: 12 (ref 5–15)
BUN: 11 mg/dL (ref 8–23)
CO2: 23 mmol/L (ref 22–32)
Calcium: 9.4 mg/dL (ref 8.9–10.3)
Chloride: 106 mmol/L (ref 98–111)
Creatinine: 0.93 mg/dL (ref 0.44–1.00)
GFR, Est AFR Am: >60 mL/min (ref >60)
GFR, Estimated: >60 mL/min (ref >60)
Glucose, Bld: 115 mg/dL — ABNORMAL HIGH (ref 70–99)
Potassium: 4 mmol/L (ref 3.5–5.1)
Sodium: 141 mmol/L (ref 135–145)
Total Bilirubin: 0.5 mg/dL (ref 0.3–1.2)
Total Protein: 7.8 g/dL (ref 6.5–8.1)

## 2020-07-07 LAB — CBC WITH DIFFERENTIAL (CANCER CENTER ONLY)
Abs Immature Granulocytes: 0.17 10*3/uL — ABNORMAL HIGH (ref 0.00–0.07)
Basophils Absolute: 0.2 10*3/uL — ABNORMAL HIGH (ref 0.0–0.1)
Basophils Relative: 2 %
Eosinophils Absolute: 0.1 10*3/uL (ref 0.0–0.5)
Eosinophils Relative: 2 %
HCT: 41.8 % (ref 36.0–46.0)
Hemoglobin: 13.2 g/dL (ref 12.0–15.0)
Immature Granulocytes: 2 %
Lymphocytes Relative: 33 %
Lymphs Abs: 2.4 10*3/uL (ref 0.7–4.0)
MCH: 26.8 pg (ref 26.0–34.0)
MCHC: 31.6 g/dL (ref 30.0–36.0)
MCV: 85 fL (ref 80.0–100.0)
Monocytes Absolute: 0.8 10*3/uL (ref 0.1–1.0)
Monocytes Relative: 11 %
Neutro Abs: 3.6 10*3/uL (ref 1.7–7.7)
Neutrophils Relative %: 50 %
Platelet Count: 267 10*3/uL (ref 150–400)
RBC: 4.92 MIL/uL (ref 3.87–5.11)
RDW: 16.8 % — ABNORMAL HIGH (ref 11.5–15.5)
WBC Count: 7.2 10*3/uL (ref 4.0–10.5)
nRBC: 0 % (ref 0.0–0.2)

## 2020-07-07 MED ORDER — IOHEXOL 300 MG/ML  SOLN
75.0000 mL | Freq: Once | INTRAMUSCULAR | Status: AC | PRN
  Administered 2020-07-07: 75 mL via INTRAVENOUS

## 2020-07-12 ENCOUNTER — Inpatient Hospital Stay: Payer: Medicare HMO | Admitting: Internal Medicine

## 2020-07-18 ENCOUNTER — Inpatient Hospital Stay (HOSPITAL_BASED_OUTPATIENT_CLINIC_OR_DEPARTMENT_OTHER): Payer: Medicare HMO | Admitting: Internal Medicine

## 2020-07-18 ENCOUNTER — Encounter: Payer: Self-pay | Admitting: Internal Medicine

## 2020-07-18 DIAGNOSIS — C3491 Malignant neoplasm of unspecified part of right bronchus or lung: Secondary | ICD-10-CM

## 2020-07-18 DIAGNOSIS — C3490 Malignant neoplasm of unspecified part of unspecified bronchus or lung: Secondary | ICD-10-CM

## 2020-07-18 NOTE — Progress Notes (Signed)
[facility] [phone]   [phone]  PROGRESS NOTE FOR TELEMEDICINE VISITS  [address], [place] [address]  I connected with@ on 07/18/20 at 10:45 AM EDT by video enabled telemedicine visit and verified that I am speaking with the correct person using two identifiers.   I discussed the limitations, risks, security and privacy concerns of performing an evaluation and management service by telemedicine and the availability of in-person appointments. I also discussed with the patient that there may be a patient responsible charge related to this service. The patient expressed understanding and agreed to proceed.  Other persons participating in the visit and their role in the encounter: None.  Patient's location: Home Provider's location: [facility]  DIAGNOSIS: Stage IA(T1b, N0, M0) non-small cell lung cancer, adenocarcinoma presented with right upper lobe pulmonary nodule   PRIOR THERAPY: Status post right upper lobectomy with lymph node dissection under the care of Dr. Roxan Hockey on July 30, 2017.  CURRENT THERAPY: Observation.  INTERVAL HISTORY: Kathleen Guerrero 74 y.o. female has a video virtual MyChart visit with me today for evaluation and discussion of her risk her results.  The patient is feeling fine with no concerning complaints.  She denied having any chest pain, shortness of breath, cough or hemoptysis.  She denied having any fever or chills.  She has no nausea, vomiting, diarrhea or constipation.  She has no headache or visual changes.  She has no recent weight loss or night sweats.  She had repeat CT scan of the chest performed recently and she is here for evaluation and discussion of her risk her results.  MEDICAL HISTORY: Past Medical History:  Diagnosis Date  . Arthritis   . GERD (gastroesophageal reflux disease)    tums  . Headache   . Hepatitis    cmv 30 yrs ago hepatitis  .  History of hiatal hernia   . History of loop recorder    after stroke  . Hypertension   . Stroke ([place]) 07/2016   balance a little off  . Tobacco use     ALLERGIES:  has No Known Allergies.  MEDICATIONS:  Current Outpatient Medications  Medication Sig Dispense Refill  . acetaminophen (TYLENOL) 325 MG tablet Take 650 mg by mouth daily as needed for mild pain or headache.    Marland Kitchen amiodarone (PACERONE) 200 MG tablet Take 1 tablet (200 mg total) by mouth daily. 30 tablet 0  . amLODipine (NORVASC) 10 MG tablet TAKE 1 TABLET BY MOUTH EVERY DAY 90 tablet 2  . aspirin 325 MG tablet Take 1 tablet (325 mg total) by mouth daily. 30 tablet 11  . atorvastatin (LIPITOR) 40 MG tablet Take 1 tablet (40 mg total) by mouth daily at 6 PM. TAKE 1 TABLET BY MOUTH EVERY DAY AT 6 PM 90 tablet 3  . calcium carbonate (TUMS - DOSED IN MG ELEMENTAL CALCIUM) 500 MG chewable tablet Chew 1 tablet by mouth 4 (four) times daily as needed for indigestion or heartburn.    . cloNIDine (CATAPRES) 0.1 MG tablet Take 1 tablet (0.1 mg total) by mouth 2 (two) times daily. 60 tablet 11  . escitalopram (LEXAPRO) 10 MG tablet TAKE 1 TABLET BY MOUTH EVERY DAY 90 tablet 1  . metoprolol tartrate (LOPRESSOR) 25 MG tablet TAKE 1/2 TABLET BY MOUTH 2 TIMES DAILY 60 tablet 5  . metroNIDAZOLE (FLAGYL) 500 MG tablet Take 1 tablet (500 mg total) by mouth 3 (three) times daily.  21 tablet 0  . ondansetron (ZOFRAN) 4 MG tablet Take 1 tablet (4 mg total) by mouth every 8 (eight) hours as needed for nausea or vomiting. 20 tablet 0  . pantoprazole (PROTONIX) 40 MG tablet TAKE 1 TABLET BY MOUTH EVERY DAY 90 tablet 1   No current facility-administered medications for this visit.    SURGICAL HISTORY:  Past Surgical History:  Procedure Laterality Date  . APPENDECTOMY    . DERMOID CYST  EXCISION     x2 33'  . EP IMPLANTABLE DEVICE N/A 07/23/2016   Procedure: Loop Recorder Insertion;  Surgeon: Thompson Grayer, MD;  Location: [facility];   Service: Cardiovascular;  Laterality: N/A;  . Intestinal obstruction  1979  . LOBECTOMY Right 07/30/2017   Procedure: LOBECTOMY;  Surgeon: [name], MD;  Location: [facility];  Service: Thoracic;  Laterality: Right;  . TEE WITHOUT CARDIOVERSION N/A 07/23/2016   Procedure: TRANSESOPHAGEAL ECHOCARDIOGRAM (TEE);  Surgeon: Lelon Perla, MD;  Location: [facility];  Service: Cardiovascular;  Laterality: N/A;  . VIDEO ASSISTED THORACOSCOPY (VATS)/WEDGE RESECTION Right 07/30/2017   Procedure: VIDEO ASSISTED THORACOSCOPY (VATS)/WEDGE RESECTION;  Surgeon: [name], MD;  [facility];  Service: Thoracic;  Laterality: Right;    REVIEW OF SYSTEMS:  A comprehensive review of systems was negative.    LABORATORY DATA: Lab Results  Component Value Date   WBC 7.2 07/07/2020   HGB 13.2 07/07/2020   HCT 41.8 07/07/2020   MCV 85.0 07/07/2020   PLT 267 07/07/2020      Chemistry      Component Value Date/Time   NA 141 07/07/2020 0954   K 4.0 07/07/2020 0954   CL 106 07/07/2020 0954   CO2 23 07/07/2020 0954   BUN 11 07/07/2020 0954   CREATININE 0.93 07/07/2020 0954      Component Value Date/Time   CALCIUM 9.4 07/07/2020 0954   ALKPHOS 77 07/07/2020 0954   AST 15 07/07/2020 0954   ALT 16 07/07/2020 0954   BILITOT 0.5 07/07/2020 0954       RADIOGRAPHIC STUDIES: CT Chest W Contrast  Result Date: 07/07/2020 CLINICAL DATA:  Non-small cell lung cancer staging EXAM: CT CHEST WITH CONTRAST TECHNIQUE: Multidetector CT imaging of the chest was performed during intravenous contrast administration. CONTRAST:  90mL OMNIPAQUE IOHEXOL 300 MG/ML  SOLN COMPARISON:  July 09, 2019 FINDINGS: Cardiovascular: Calcified atheromatous plaque of the thoracic aorta and noncalcified plaque with similar appearance. Heart size is normal without pericardial effusion. Retropharyngeal course of carotid arteries is incidentally noted. Central pulmonary vasculature is unremarkable on venous phase  assessment. Coronary artery calcification in RIGHT and LEFT coronary circulation similar to prior study. Mediastinum/Nodes: Thoracic inlet structures aside from retropharyngeal course of carotid arteries are unremarkable. Loop recorder projecting over the LEFT chest. No axillary lymphadenopathy. No hilar adenopathy. No mediastinal adenopathy.  Moderately large hiatal hernia. Lungs/Pleura: Post RIGHT upper lobectomy. No consolidation. No pleural effusion. No suspicious nodule. Upper Abdomen: Incidental imaging of upper abdominal contents without acute process. Limited assessment. Musculoskeletal: No acute musculoskeletal finding. Spinal degenerative changes. IMPRESSION: 1. Postoperative changes in the chest related to RIGHT upper lobectomy. No signs of disease recurrence. 2. Hepatic steatosis. Aortic Atherosclerosis (ICD10-I70.0). Electronically Signed   By: Zetta Bills M.D.   On: 07/07/2020 14:21    ASSESSMENT AND PLAN: This is a very pleasant 74 years old white female with stage IA non-small cell lung cancer status post right upper lobectomy with lymph node dissection in September 2018.  The patient is currently on observation and she is feeling fine today with no concerning complaints. She had repeat CT scan of the chest performed recently.  I personally and independently reviewed the scans and discussed the results with the patient today. Her scan showed no concerning findings for disease recurrence or metastasis. I recommended for her to continue on observation with repeat CT scan of the chest in 1 year. The patient was advised to call immediately if she has any concerning symptoms in the interval. I discussed the assessment and treatment plan with the patient. The patient was provided an opportunity to ask questions and all were answered. The patient agreed with the plan and demonstrated an understanding of the instructions.   The patient was advised to call back or seek an in-person evaluation  if the symptoms worsen or if the condition fails to improve as anticipated.  I provided 15 minutes of face-to-face video visit time during this encounter, and > 50% was spent counseling as documented under my assessment & plan.  Eilleen Kempf, MD 07/18/2020 9:59 AM  Disclaimer: This note was dictated with voice recognition software. Similar sounding words can inadvertently be transcribed and may not be corrected upon review.

## 2020-07-26 ENCOUNTER — Other Ambulatory Visit: Payer: Self-pay

## 2020-07-26 ENCOUNTER — Telehealth: Payer: Self-pay | Admitting: Internal Medicine

## 2020-07-26 MED ORDER — METOPROLOL TARTRATE 25 MG PO TABS: ORAL_TABLET | ORAL | 5 refills | Status: AC

## 2020-07-26 MED ORDER — METOPROLOL TARTRATE 25 MG PO TABS: ORAL_TABLET | ORAL | 5 refills | Status: DC

## 2020-07-26 NOTE — Telephone Encounter (Signed)
Medication was sent to pts pharmacy.

## 2020-07-26 NOTE — Telephone Encounter (Signed)
    [facility] requesting 90 day supply refill for metoprolol tartrate (LOPRESSOR) 25 MG tablet

## 2020-07-31 ENCOUNTER — Telehealth: Payer: Self-pay | Admitting: Internal Medicine

## 2020-07-31 DIAGNOSIS — R69 Illness, unspecified: Secondary | ICD-10-CM | POA: Diagnosis not present

## 2020-07-31 NOTE — Telephone Encounter (Signed)
Called and left msg about added appts. Mailed printout

## 2020-08-10 ENCOUNTER — Other Ambulatory Visit: Payer: Self-pay | Admitting: Internal Medicine

## 2020-08-26 ENCOUNTER — Other Ambulatory Visit: Payer: Self-pay | Admitting: Internal Medicine

## 2020-08-26 NOTE — Telephone Encounter (Signed)
lexapro done 1 mo only  Please ask pt to make rov nov 2021 or soon after

## 2020-09-11 ENCOUNTER — Telehealth: Payer: Self-pay | Admitting: Internal Medicine

## 2020-09-11 NOTE — Telephone Encounter (Signed)
LVM for pt to rtn my call to schedule AWV with [facility]. Please schedule this appt if pt calls the office    Thanks, [phone]

## 2020-09-18 ENCOUNTER — Ambulatory Visit (INDEPENDENT_AMBULATORY_CARE_PROVIDER_SITE_OTHER): Payer: Medicare HMO | Admitting: Internal Medicine

## 2020-09-18 ENCOUNTER — Encounter: Payer: Self-pay | Admitting: Internal Medicine

## 2020-09-18 ENCOUNTER — Other Ambulatory Visit: Payer: Self-pay

## 2020-09-18 VITALS — BP 120/70 | HR 87 | Temp 98.7°F | Ht 62.0 in | Wt 194.0 lb

## 2020-09-18 DIAGNOSIS — E538 Deficiency of other specified B group vitamins: Secondary | ICD-10-CM | POA: Diagnosis not present

## 2020-09-18 DIAGNOSIS — R739 Hyperglycemia, unspecified: Secondary | ICD-10-CM

## 2020-09-18 DIAGNOSIS — Z0000 Encounter for general adult medical examination without abnormal findings: Secondary | ICD-10-CM

## 2020-09-18 DIAGNOSIS — E559 Vitamin D deficiency, unspecified: Secondary | ICD-10-CM

## 2020-09-18 DIAGNOSIS — I10 Essential (primary) hypertension: Secondary | ICD-10-CM

## 2020-09-18 LAB — URINALYSIS, ROUTINE W REFLEX MICROSCOPIC
Bilirubin Urine: NEGATIVE
Hgb urine dipstick: NEGATIVE
Leukocytes,Ua: NEGATIVE
Nitrite: NEGATIVE
Specific Gravity, Urine: 1.025 (ref 1.000–1.030)
Total Protein, Urine: NEGATIVE
Urine Glucose: NEGATIVE
Urobilinogen, UA: 0.2 (ref 0.0–1.0)
pH: 5.5 (ref 5.0–8.0)

## 2020-09-18 LAB — CBC WITH DIFFERENTIAL/PLATELET
Basophils Absolute: 0.2 10*3/uL — ABNORMAL HIGH (ref 0.0–0.1)
Basophils Relative: 2.1 % (ref 0.0–3.0)
Eosinophils Absolute: 0.1 10*3/uL (ref 0.0–0.7)
Eosinophils Relative: 1.1 % (ref 0.0–5.0)
HCT: 40.1 % (ref 36.0–46.0)
Hemoglobin: 13 g/dL (ref 12.0–15.0)
Lymphocytes Relative: 26.5 % (ref 12.0–46.0)
Lymphs Abs: 2.4 10*3/uL (ref 0.7–4.0)
MCHC: 32.5 g/dL (ref 30.0–36.0)
MCV: 81.7 fl (ref 78.0–100.0)
Monocytes Absolute: 1.1 10*3/uL — ABNORMAL HIGH (ref 0.1–1.0)
Monocytes Relative: 12 % (ref 3.0–12.0)
Neutro Abs: 5.2 10*3/uL (ref 1.4–7.7)
Neutrophils Relative %: 58.3 % (ref 43.0–77.0)
Platelets: 283 10*3/uL (ref 150.0–400.0)
RBC: 4.91 Mil/uL (ref 3.87–5.11)
RDW: 17.5 % — ABNORMAL HIGH (ref 11.5–15.5)
WBC: 8.9 10*3/uL (ref 4.0–10.5)

## 2020-09-18 LAB — LIPID PANEL
Cholesterol: 98 mg/dL (ref 0–200)
HDL: 36.9 mg/dL — ABNORMAL LOW (ref >39.00)
LDL Cholesterol: 42 mg/dL (ref 0–99)
NonHDL: 60.93
Total CHOL/HDL Ratio: 3
Triglycerides: 97 mg/dL (ref 0.0–149.0)
VLDL: 19.4 mg/dL (ref 0.0–40.0)

## 2020-09-18 LAB — BASIC METABOLIC PANEL
BUN: 13 mg/dL (ref 6–23)
CO2: 28 mEq/L (ref 19–32)
Calcium: 9.2 mg/dL (ref 8.4–10.5)
Chloride: 103 mEq/L (ref 96–112)
Creatinine, Ser: 1.01 mg/dL (ref 0.40–1.20)
GFR: 55.62 mL/min — ABNORMAL LOW (ref >60.00)
Glucose, Bld: 95 mg/dL (ref 70–99)
Potassium: 4.5 mEq/L (ref 3.5–5.1)
Sodium: 140 mEq/L (ref 135–145)

## 2020-09-18 LAB — HEPATIC FUNCTION PANEL
ALT: 25 U/L (ref 0–35)
AST: 25 U/L (ref 0–37)
Albumin: 4 g/dL (ref 3.5–5.2)
Alkaline Phosphatase: 67 U/L (ref 39–117)
Bilirubin, Direct: 0.1 mg/dL (ref 0.0–0.3)
Total Bilirubin: 0.7 mg/dL (ref 0.2–1.2)
Total Protein: 7.9 g/dL (ref 6.0–8.3)

## 2020-09-18 LAB — VITAMIN D 25 HYDROXY (VIT D DEFICIENCY, FRACTURES): VITD: 9.02 ng/mL — ABNORMAL LOW (ref 30.00–100.00)

## 2020-09-18 LAB — HEMOGLOBIN A1C: Hgb A1c MFr Bld: 5.8 % (ref 4.6–6.5)

## 2020-09-18 LAB — TSH: TSH: 2.31 u[IU]/mL (ref 0.35–4.50)

## 2020-09-18 LAB — VITAMIN B12: Vitamin B-12: 283 pg/mL (ref 211–911)

## 2020-09-18 NOTE — Progress Notes (Signed)
Subjective:    Patient ID: Kathleen Guerrero, female    DOB: [birth year], 74 y.o.   MRN: [id number]  HPI  Here for wellness and f/u;  Overall doing ok;  Pt denies Chest pain, worsening SOB, DOE, wheezing, orthopnea, PND, worsening LE edema, palpitations, dizziness or syncope.  Pt denies neurological change such as new headache, facial or extremity weakness.  Pt denies polydipsia, polyuria, or low sugar symptoms. Pt states overall good compliance with treatment and medications, good tolerability, and has been trying to follow appropriate diet.  Pt denies worsening depressive symptoms, suicidal ideation or panic. No fever, night sweats, wt loss, loss of appetite, or other constitutional symptoms.  Pt states good ability with ADL's, has low fall risk, home safety reviewed and adequate, no other significant changes in hearing or vision, and only occasionally active with exercise.  Wary of [facility] and colonoscopy for now given past hx of malignancy and never could find out why spent 2 wks on ventilator after prior anesthesia.   Wt Readings from Last 3 Encounters:  09/18/20 194 lb (88 kg)  09/16/19 192 lb (87.1 kg)  07/13/19 191 lb 12.8 oz (87 kg)   Past Medical History:  Diagnosis Date  . Arthritis   . GERD (gastroesophageal reflux disease)    tums  . Headache   . Hepatitis    cmv 30 yrs ago hepatitis  . History of hiatal hernia   . History of loop recorder    after stroke  . Hypertension   . Stroke ([facility] [place]) 07/2016   balance a little off  . Tobacco use    Past Surgical History:  Procedure Laterality Date  . APPENDECTOMY    . DERMOID CYST  EXCISION     x2 48'  . EP IMPLANTABLE DEVICE N/A 07/23/2016   Procedure: Loop Recorder Insertion;  Surgeon: [place] Grayer, MD;  Location: [facility];  Service: Cardiovascular;  Laterality: N/A;  . Intestinal obstruction  1979  . LOBECTOMY Right 07/30/2017   Procedure: LOBECTOMY;  Surgeon: [name], MD;  Location: [facility];  Service:  Thoracic;  Laterality: Right;  . TEE WITHOUT CARDIOVERSION N/A 07/23/2016   Procedure: TRANSESOPHAGEAL ECHOCARDIOGRAM (TEE);  Surgeon: Lelon Perla, MD;  Location: [facility];  Service: Cardiovascular;  Laterality: N/A;  . VIDEO ASSISTED THORACOSCOPY (VATS)/WEDGE RESECTION Right 07/30/2017   Procedure: VIDEO ASSISTED THORACOSCOPY (VATS)/WEDGE RESECTION;  Surgeon: [name], MD;  Location: [facility];  Service: Thoracic;  Laterality: Right;    reports that she quit smoking about 4 years ago. Her smoking use included cigarettes. She has a 50.00 pack-year smoking history. She has never used smokeless tobacco. She reports current alcohol use. She reports that she does not use drugs. family history includes Cancer in her maternal grandfather; Kidney disease in her maternal grandmother. No Known Allergies Current Outpatient Medications on File Prior to Visit  Medication Sig Dispense Refill  . acetaminophen (TYLENOL) 325 MG tablet Take 650 mg by mouth daily as needed for mild pain or headache.    Marland Kitchen amiodarone (PACERONE) 200 MG tablet Take 1 tablet (200 mg total) by mouth daily. 30 tablet 0  . amLODipine (NORVASC) 10 MG tablet TAKE 1 TABLET BY MOUTH EVERY DAY 90 tablet 1  . aspirin 325 MG tablet Take 1 tablet (325 mg total) by mouth daily. 30 tablet 11  . atorvastatin (LIPITOR) 40 MG tablet Take 1 tablet (40 mg total) by mouth daily at 6 PM. TAKE 1 TABLET BY MOUTH EVERY  DAY AT 6 PM 90 tablet 3  . calcium carbonate (TUMS - DOSED IN MG ELEMENTAL CALCIUM) 500 MG chewable tablet Chew 1 tablet by mouth 4 (four) times daily as needed for indigestion or heartburn.    . escitalopram (LEXAPRO) 10 MG tablet TAKE 1 TABLET BY MOUTH EVERY DAY 30 tablet 0  . metoprolol tartrate (LOPRESSOR) 25 MG tablet TAKE 1/2 TABLET BY MOUTH 2 TIMES DAILY 90 tablet 5  . ondansetron (ZOFRAN) 4 MG tablet Take 1 tablet (4 mg total) by mouth every 8 (eight) hours as needed for nausea or vomiting. 20 tablet 0  . pantoprazole  (PROTONIX) 40 MG tablet TAKE 1 TABLET BY MOUTH EVERY DAY 90 tablet 1   No current facility-administered medications on file prior to visit.   Review of Systems All otherwise neg per pt    Objective:   Physical Exam BP 120/70 (BP Location: Left Arm, Patient Position: Sitting, Cuff Size: Large)   Pulse 87   Temp 98.7 F (37.1 C) (Oral)   Ht 5\' 2"  (1.575 m)   Wt 194 lb (88 kg)   SpO2 94%   BMI 35.48 kg/m  VS noted,  Constitutional: Pt appears in NAD HENT: Head: NCAT.  Right Ear: External ear normal.  Left Ear: External ear normal.  Eyes: . Pupils are equal, round, and reactive to light. Conjunctivae and EOM are normal Nose: without d/c or deformity Neck: Neck supple. Gross normal ROM Cardiovascular: Normal rate and regular rhythm.   Pulmonary/Chest: Effort normal and breath sounds without rales or wheezing.  Abd:  Soft, NT, ND, + BS, no organomegaly Neurological: Pt is alert. At baseline orientation, motor grossly intact Skin: Skin is warm. No rashes, other new lesions, no LE edema Psychiatric: Pt behavior is normal without agitation  All otherwise neg per pt Lab Results  Component Value Date   WBC 8.9 09/18/2020   HGB 13.0 09/18/2020   HCT 40.1 09/18/2020   PLT 283.0 09/18/2020   GLUCOSE 95 09/18/2020   CHOL 98 09/18/2020   TRIG 97.0 09/18/2020   HDL 36.90 (L) 09/18/2020   LDLCALC 42 09/18/2020   ALT 25 09/18/2020   AST 25 09/18/2020   NA 140 09/18/2020   K 4.5 09/18/2020   CL 103 09/18/2020   CREATININE 1.01 09/18/2020   BUN 13 09/18/2020   CO2 28 09/18/2020   TSH 2.31 09/18/2020   INR 0.99 07/28/2017   HGBA1C 5.8 09/18/2020      Assessment & Plan:

## 2020-09-18 NOTE — Patient Instructions (Signed)

## 2020-09-20 ENCOUNTER — Ambulatory Visit (INDEPENDENT_AMBULATORY_CARE_PROVIDER_SITE_OTHER): Payer: Medicare HMO

## 2020-09-20 DIAGNOSIS — Z0000 Encounter for general adult medical examination without abnormal findings: Secondary | ICD-10-CM

## 2020-09-20 NOTE — Progress Notes (Signed)
I connected with Kathleen Guerrero today by telephone and verified that I am speaking with the correct person using two identifiers. Location patient: home Location provider: work Persons participating in the virtual visit: [name] and Lisette Abu, LPN.   I discussed the limitations, risks, security and privacy concerns of performing an evaluation and management service by telephone and the availability of in person appointments. I also discussed with the patient that there may be a patient responsible charge related to this service. The patient expressed understanding and verbally consented to this telephonic visit.    Interactive audio and video telecommunications were attempted between this provider and patient, however failed, due to patient having technical difficulties OR patient did not have access to video capability.  We continued and completed visit with audio only.  Some vital signs may be absent or patient reported.   Time Spent with patient on telephone encounter: 35 minutes  Subjective:   Kathleen Guerrero is a 74 y.o. female who presents for Medicare Annual (Subsequent) preventive examination.  Review of Systems    No ROS. Medicare Wellness Visit. Additional risk factors are reflected in social history. Cardiac Risk Factors include: advanced age (>69men, >60 women);dyslipidemia;hypertension Sleep Patterns: No sleep issues, feels rested on waking and sleeps 8 hours nightly. Home Safety/Smoke Alarms: Feels safe in home; uses home alarm. Smoke alarms in place. Living environment: 1-story home; Lives alone; no needs for DME; good support system. Seat Belt Safety/Bike Helmet: Wears seat belt.    Objective:    There were no vitals filed for this visit. There is no height or weight on file to calculate BMI.  Advanced Directives 09/20/2020 12/29/2017 09/24/2017 08/22/2017 07/31/2017 07/30/2017 07/28/2017  Does Patient Have a Medical Advance Directive? No No No No No No No    Would patient like information on creating a medical advance directive? No - Patient declined No - Patient declined No - Patient declined No - Patient declined Yes (MAU/Ambulatory/Procedural Areas - Information given) - Yes (MAU/Ambulatory/Procedural Areas - Information given)    Current Medications (verified) Outpatient Encounter Medications as of 09/20/2020  Medication Sig  . acetaminophen (TYLENOL) 325 MG tablet Take 650 mg by mouth daily as needed for mild pain or headache.  Marland Kitchen amiodarone (PACERONE) 200 MG tablet Take 1 tablet (200 mg total) by mouth daily.  Marland Kitchen amLODipine (NORVASC) 10 MG tablet TAKE 1 TABLET BY MOUTH EVERY DAY  . aspirin 325 MG tablet Take 1 tablet (325 mg total) by mouth daily.  Marland Kitchen atorvastatin (LIPITOR) 40 MG tablet Take 1 tablet (40 mg total) by mouth daily at 6 PM. TAKE 1 TABLET BY MOUTH EVERY DAY AT 6 PM  . calcium carbonate (TUMS - DOSED IN MG ELEMENTAL CALCIUM) 500 MG chewable tablet Chew 1 tablet by mouth 4 (four) times daily as needed for indigestion or heartburn.  . escitalopram (LEXAPRO) 10 MG tablet TAKE 1 TABLET BY MOUTH EVERY DAY  . metoprolol tartrate (LOPRESSOR) 25 MG tablet TAKE 1/2 TABLET BY MOUTH 2 TIMES DAILY  . ondansetron (ZOFRAN) 4 MG tablet Take 1 tablet (4 mg total) by mouth every 8 (eight) hours as needed for nausea or vomiting.  . pantoprazole (PROTONIX) 40 MG tablet TAKE 1 TABLET BY MOUTH EVERY DAY   No facility-administered encounter medications on file as of 09/20/2020.    Allergies (verified) Patient has no known allergies.   History: Past Medical History:  Diagnosis Date  . Arthritis   . GERD (gastroesophageal reflux disease)    tums  .  Headache   . Hepatitis    cmv 30 yrs ago hepatitis  . History of hiatal hernia   . History of loop recorder    after stroke  . Hypertension   . Stroke ([place]) 07/2016   balance a little off  . Tobacco use    Past Surgical History:  Procedure Laterality Date  . APPENDECTOMY    . DERMOID CYST   EXCISION     x2 2'  . EP IMPLANTABLE DEVICE N/A 07/23/2016   Procedure: Loop Recorder Insertion;  Surgeon: Thompson Grayer, MD;  Location: [facility];  Service: Cardiovascular;  Laterality: N/A;  . Intestinal obstruction  1979  . LOBECTOMY Right 07/30/2017   Procedure: LOBECTOMY;  Surgeon: [name], MD;  Location: [facility];  Service: Thoracic;  Laterality: Right;  . TEE WITHOUT CARDIOVERSION N/A 07/23/2016   Procedure: TRANSESOPHAGEAL ECHOCARDIOGRAM (TEE);  Surgeon: Lelon Perla, MD;  Location: [facility] END[facility];  Service: Cardiovascular;  Laterality: N/A;  . VIDEO ASSISTED THORACOSCOPY (VATS)/WEDGE RESECTION Right 07/30/2017   Procedure: VIDEO ASSISTED THORACOSCOPY (VATS)/WEDGE RESECTION;  Surgeon: [name], MD;  Location: [facility] OR;  Service: Thoracic;  Laterality: Right;   Family History  Problem Relation Age of Onset  . Kidney disease Maternal Grandmother   . Cancer Maternal Grandfather    Social History   Socioeconomic History  . Marital status: Divorced    Spouse name: Not on file  . Number of children: 1  . Years of education: 68  . Highest education level: Not on file  Occupational History  . Occupation: Retired  Tobacco Use  . Smoking status: Former Smoker    Packs/day: 1.00    Years: 50.00    Pack years: 50.00    Types: Cigarettes    Quit date: 07/20/2016    Years since quitting: 4.1  . Smokeless tobacco: Never Used  Vaping Use  . Vaping Use: Never used  Substance and Sexual Activity  . Alcohol use: Yes    Comment: Rarely  . Drug use: No  . Sexual activity: Not on file  Other Topics Concern  . Not on file  Social History Narrative   Fun: Read   Denies abuse and feels safe at home.    Social Determinants of Health   Financial Resource Strain: Low Risk   . Difficulty of Paying Living Expenses: Not hard at all  Food Insecurity: No Food Insecurity  . Worried About Charity fundraiser in the Last Year: Never true  . Ran Out of Food in the  Last Year: Never true  Transportation Needs: No Transportation Needs  . Lack of Transportation (Medical): No  . Lack of Transportation (Non-Medical): No  Physical Activity: Inactive  . Days of Exercise per Week: 0 days  . Minutes of Exercise per Session: 0 min  Stress: No Stress Concern Present  . Feeling of Stress : Not at all  Social Connections: Unknown  . Frequency of Communication with Friends and Family: More than three times a week  . Frequency of Social Gatherings with Friends and Family: Never  . Attends Religious Services: Never  . Active Member of Clubs or Organizations: No  . Attends Archivist Meetings: Never  . Marital Status: Patient refused    Tobacco Counseling Counseling given: Not Answered   Clinical Intake:  Pre-visit preparation completed: Yes  Pain : No/denies pain     Nutritional Risks: None Diabetes: No  How often do you need to have someone help  you when you read instructions, pamphlets, or other written materials from your doctor or pharmacy?: 1 - Never What is the last grade level you completed in school?: HSG  Diabetic? no  Interpreter Needed?: No  Information entered by :: Lisette Abu, LPN   Activities of Daily Living In your present state of health, do you have any difficulty performing the following activities: 09/20/2020 09/18/2020  Hearing? N N  Vision? N N  Difficulty concentrating or making decisions? N N  Walking or climbing stairs? N N  Dressing or bathing? N N  Doing errands, shopping? N N  Preparing Food and eating ? N -  Using the Toilet? N -  In the past six months, have you accidently leaked urine? Y -  Do you have problems with loss of bowel control? Y -  Managing your Medications? N -  Managing your Finances? N -  Housekeeping or managing your Housekeeping? N -  Some recent data might be hidden    Patient Care Team: Biagio Borg, MD as PCP - General (Internal Medicine)  Indicate any recent  Medical Services you may have received from other than [facility] providers in the past year (date may be approximate).     Assessment:   This is a routine wellness examination for Kathleen Guerrero.  Hearing/Vision screen No exam data present  Dietary issues and exercise activities discussed: Current Exercise Habits: The patient does not participate in regular exercise at present, Exercise limited by: cardiac condition(s);respiratory conditions(s);psychological condition(s)  Goals   None    Depression Screen PHQ 2/9 Scores 09/20/2020 09/18/2020 09/18/2020 09/18/2020 09/18/2020 09/16/2019 09/10/2018  PHQ - 2 Score 0 0 0 0 0 1 1  PHQ- 9 Score - - 0 - - - -    Fall Risk Fall Risk  09/20/2020 09/18/2020 09/18/2020 09/16/2019 09/10/2018  Falls in the past year? 1 0 0 1 0  Number falls in past yr: 0 - - 0 -  Injury with Fall? 0 - - 0 -  Follow up Falls evaluation completed - - - -    Any stairs in or around the home? No  If so, are there any without handrails? No  Home free of loose throw rugs in walkways, pet beds, electrical cords, etc? Yes  Adequate lighting in your home to reduce risk of falls? Yes   ASSISTIVE DEVICES UTILIZED TO PREVENT FALLS:  Life alert? No  Use of a cane, walker or w/c? No  Grab bars in the bathroom? Yes  Shower chair or bench in shower? Yes  Elevated toilet seat or a handicapped toilet? No   TIMED UP AND GO:  Was the test performed? No .  Length of time to ambulate 10 feet: 0 sec.   Gait steady and fast without use of assistive device  Cognitive Function: Patient is cogitatively intact.        Immunizations Immunization History  Administered Date(s) Administered  . Fluad Quad(high Dose 65+) 06/30/2019  . Influenza, High Dose Seasonal PF 07/26/2016, 07/09/2017, 09/10/2018  . Influenza-Unspecified 07/31/2020  . PFIZER SARS-COV-2 Vaccination 11/26/2019, 12/17/2019, 07/31/2020  . Pneumococcal Conjugate-13 07/26/2016  . Pneumococcal Polysaccharide-23  09/10/2018  . Tdap 09/10/2018    TDAP status: Up to date Flu Vaccine status: Up to date Pneumococcal vaccine status: Up to date Covid-19 vaccine status: Completed vaccines  Qualifies for Shingles Vaccine? Yes   Zostavax completed No   Shingrix Completed?: No.    Education has been provided regarding the importance of this vaccine. Patient  has been advised to call insurance company to determine out of pocket expense if they have not yet received this vaccine. Advised may also receive vaccine at local pharmacy or Health Dept. Verbalized acceptance and understanding.  Screening Tests Health Maintenance  Topic Date Due  . MAMMOGRAM  09/18/2021 (Originally 03/17/1998)  . COLONOSCOPY  09/18/2021 (Originally 03/17/1998)  . TETANUS/TDAP  09/10/2028  . INFLUENZA VACCINE  Completed  . DEXA SCAN  Completed  . COVID-19 Vaccine  Completed  . Hepatitis C Screening  Completed  . PNA vac Low Risk Adult  Completed    Health Maintenance  There are no preventive care reminders to display for this patient.  Colorectal cancer screening: No longer required. never done  Mammogram status: No longer required.  never done Bone Density status: Completed 11/26/2018. Results reflect: Bone density results: NORMAL. Repeat every 5 years.  Lung Cancer Screening: (Low Dose CT Chest recommended if Age 15-80 years, 30 pack-year currently smoking OR have quit w/in 15years.) does qualify.   Lung Cancer Screening Referral: no; has an oncologist  Additional Screening:  Hepatitis C Screening: does qualify; Completed yes  Vision Screening: Recommended annual ophthalmology exams for early detection of glaucoma and other disorders of the eye. Is the patient up to date with their annual eye exam?  No  Who is the provider or what is the name of the office in which the patient attends annual eye exams? Patient refused  If pt is not established with a provider, would they like to be referred to a provider to establish  care? No .   Dental Screening: Recommended annual dental exams for proper oral hygiene  Community Resource Referral / Chronic Care Management: CRR required this visit?  No   CCM required this visit?  No      Plan:     I have personally reviewed and noted the following in the patient's chart:   . Medical and social history . Use of alcohol, tobacco or illicit drugs  . Current medications and supplements . Functional ability and status . Nutritional status . Physical activity . Advanced directives . List of other physicians . Hospitalizations, surgeries, and ER visits in previous 12 months . Vitals . Screenings to include cognitive, depression, and falls . Referrals and appointments  In addition, I have reviewed and discussed with patient certain preventive protocols, quality metrics, and best practice recommendations. A written personalized care plan for preventive services as well as general preventive health recommendations were provided to patient.     Sheral Flow, LPN   [id number]   Nurse Notes:  Patient is cogitatively intact. There were no vitals filed for this visit. There is no height or weight on file to calculate BMI. Patient stated that she has no issues with gait or balance; does not use any assistive devices.

## 2020-09-20 NOTE — Patient Instructions (Signed)
Kathleen Guerrero , Thank you for taking time to come for your Medicare Wellness Visit. I appreciate your ongoing commitment to your health goals. Please review the following plan we discussed and let me know if I can assist you in the future.   Screening recommendations/referrals: Colonoscopy: never done Mammogram: never done Bone Density: 11/26/2018; due every 5 years Recommended yearly ophthalmology/optometry visit for glaucoma screening and checkup Recommended yearly dental visit for hygiene and checkup  Vaccinations: Influenza vaccine: 07/31/2020 Pneumococcal vaccine: up to date Tdap vaccine: 09/10/2018; due every 10 years Shingles vaccine: never done   Covid-19: up to date  Advanced directives: Advance directive discussed with you today. Even though you declined this today please call our office should you change your mind and we can give you the proper paperwork for you to fill out.  Conditions/risks identified: Yes; Reviewed health maintenance screenings with patient today and relevant education, vaccines, and/or referrals were provided. Please continue to do your personal lifestyle choices by: daily care of teeth and gums, regular physical activity (goal should be 5 days a week for 30 minutes), eat a healthy diet, avoid tobacco and drug use, limiting any alcohol intake, taking a low-dose aspirin (if not allergic or have been advised by your provider otherwise) and taking vitamins and minerals as recommended by your provider. Continue doing brain stimulating activities (puzzles, reading, adult coloring books, staying active) to keep memory sharp. Continue to eat heart healthy diet (full of fruits, vegetables, whole grains, lean protein, water--limit salt, fat, and sugar intake) and increase physical activity as tolerated.  Next appointment: Please schedule your next Medicare Wellness Visit with your Nurse Health Advisor in 1 year by calling [phone].   Preventive Care 73 Years and Older,  Female Preventive care refers to lifestyle choices and visits with your health care provider that can promote health and wellness. What does preventive care include?  A yearly physical exam. This is also called an annual well check.  Dental exams once or twice a year.  Routine eye exams. Ask your health care provider how often you should have your eyes checked.  Personal lifestyle choices, including:  Daily care of your teeth and gums.  Regular physical activity.  Eating a healthy diet.  Avoiding tobacco and drug use.  Limiting alcohol use.  Practicing safe sex.  Taking low-dose aspirin every day.  Taking vitamin and mineral supplements as recommended by your health care provider. What happens during an annual well check? The services and screenings done by your health care provider during your annual well check will depend on your age, overall health, lifestyle risk factors, and family history of disease. Counseling  Your health care provider may ask you questions about your:  Alcohol use.  Tobacco use.  Drug use.  Emotional well-being.  Home and relationship well-being.  Sexual activity.  Eating habits.  History of falls.  Memory and ability to understand (cognition).  Work and work Statistician.  Reproductive health. Screening  You may have the following tests or measurements:  Height, weight, and BMI.  Blood pressure.  Lipid and cholesterol levels. These may be checked every 5 years, or more frequently if you are over 72 years old.  Skin check.  Lung cancer screening. You may have this screening every year starting at age 13 if you have a 30-pack-year history of smoking and currently smoke or have quit within the past 15 years.  Fecal occult blood test (FOBT) of the stool. You may have this test every year  starting at age 74.  Flexible sigmoidoscopy or colonoscopy. You may have a sigmoidoscopy every 5 years or a colonoscopy every 10 years  starting at age 22.  Hepatitis C blood test.  Hepatitis B blood test.  Sexually transmitted disease (STD) testing.  Diabetes screening. This is done by checking your blood sugar (glucose) after you have not eaten for a while (fasting). You may have this done every 1-3 years.  Bone density scan. This is done to screen for osteoporosis. You may have this done starting at age 32.  Mammogram. This may be done every 1-2 years. Talk to your health care provider about how often you should have regular mammograms. Talk with your health care provider about your test results, treatment options, and if necessary, the need for more tests. Vaccines  Your health care provider may recommend certain vaccines, such as:  Influenza vaccine. This is recommended every year.  Tetanus, diphtheria, and acellular pertussis (Tdap, Td) vaccine. You may need a Td booster every 10 years.  Zoster vaccine. You may need this after age 14.  Pneumococcal 13-valent conjugate (PCV13) vaccine. One dose is recommended after age 12.  Pneumococcal polysaccharide (PPSV23) vaccine. One dose is recommended after age 14. Talk to your health care provider about which screenings and vaccines you need and how often you need them. This information is not intended to replace advice given to you by your health care provider. Make sure you discuss any questions you have with your health care provider. Document Released: 11/17/2015 Document Revised: 07/10/2016 Document Reviewed: 08/22/2015 Elsevier Interactive Patient Education  2017 [place] Prevention in the Home Falls can cause injuries. They can happen to people of all ages. There are many things you can do to make your home safe and to help prevent falls. What can I do on the outside of my home?  Regularly fix the edges of walkways and driveways and fix any cracks.  Remove anything that might make you trip as you walk through a door, such as a raised step or  threshold.  Trim any bushes or trees on the path to your home.  Use bright outdoor lighting.  Clear any walking paths of anything that might make someone trip, such as rocks or tools.  Regularly check to see if handrails are loose or broken. Make sure that both sides of any steps have handrails.  Any raised decks and porches should have guardrails on the edges.  Have any leaves, snow, or ice cleared regularly.  Use sand or salt on walking paths during winter.  Clean up any spills in your garage right away. This includes oil or grease spills. What can I do in the bathroom?  Use night lights.  Install grab bars by the toilet and in the tub and shower. Do not use towel bars as grab bars.  Use non-skid mats or decals in the tub or shower.  If you need to sit down in the shower, use a plastic, non-slip stool.  Keep the floor dry. Clean up any water that spills on the floor as soon as it happens.  Remove soap buildup in the tub or shower regularly.  Attach bath mats securely with double-sided non-slip rug tape.  Do not have throw rugs and other things on the floor that can make you trip. What can I do in the bedroom?  Use night lights.  Make sure that you have a light by your bed that is easy to reach.  Do not  use any sheets or blankets that are too big for your bed. They should not hang down onto the floor.  Have a firm chair that has side arms. You can use this for support while you get dressed.  Do not have throw rugs and other things on the floor that can make you trip. What can I do in the kitchen?  Clean up any spills right away.  Avoid walking on wet floors.  Keep items that you use a lot in easy-to-reach places.  If you need to reach something above you, use a strong step stool that has a grab bar.  Keep electrical cords out of the way.  Do not use floor polish or wax that makes floors slippery. If you must use wax, use non-skid floor wax.  Do not have  throw rugs and other things on the floor that can make you trip. What can I do with my stairs?  Do not leave any items on the stairs.  Make sure that there are handrails on both sides of the stairs and use them. Fix handrails that are broken or loose. Make sure that handrails are as long as the stairways.  Check any carpeting to make sure that it is firmly attached to the stairs. Fix any carpet that is loose or worn.  Avoid having throw rugs at the top or bottom of the stairs. If you do have throw rugs, attach them to the floor with carpet tape.  Make sure that you have a light switch at the top of the stairs and the bottom of the stairs. If you do not have them, ask someone to add them for you. What else can I do to help prevent falls?  Wear shoes that:  Do not have high heels.  Have rubber bottoms.  Are comfortable and fit you well.  Are closed at the toe. Do not wear sandals.  If you use a stepladder:  Make sure that it is fully opened. Do not climb a closed stepladder.  Make sure that both sides of the stepladder are locked into place.  Ask someone to hold it for you, if possible.  Clearly mark and make sure that you can see:  Any grab bars or handrails.  First and last steps.  Where the edge of each step is.  Use tools that help you move around (mobility aids) if they are needed. These include:  Canes.  Walkers.  Scooters.  Crutches.  Turn on the lights when you go into a dark area. Replace any light bulbs as soon as they burn out.  Set up your furniture so you have a clear path. Avoid moving your furniture around.  If any of your floors are uneven, fix them.  If there are any pets around you, be aware of where they are.  Review your medicines with your doctor. Some medicines can make you feel dizzy. This can increase your chance of falling. Ask your doctor what other things that you can do to help prevent falls. This information is not intended to  replace advice given to you by your health care provider. Make sure you discuss any questions you have with your health care provider. Document Released: 08/17/2009 Document Revised: 03/28/2016 Document Reviewed: 11/25/2014 Elsevier Interactive Patient Education  2017 [employer].

## 2020-09-22 ENCOUNTER — Encounter: Payer: Self-pay | Admitting: Internal Medicine

## 2020-09-24 ENCOUNTER — Encounter: Payer: Self-pay | Admitting: Internal Medicine

## 2020-09-24 NOTE — Assessment & Plan Note (Signed)
stable overall by history and exam, recent data reviewed with pt, and pt to continue medical treatment as before,  to f/u any worsening symptoms or concerns  

## 2020-09-24 NOTE — Assessment & Plan Note (Signed)

## 2020-10-02 ENCOUNTER — Telehealth (INDEPENDENT_AMBULATORY_CARE_PROVIDER_SITE_OTHER): Payer: Medicare HMO | Admitting: Family

## 2020-10-02 ENCOUNTER — Other Ambulatory Visit: Payer: Self-pay | Admitting: Family

## 2020-10-02 DIAGNOSIS — J069 Acute upper respiratory infection, unspecified: Secondary | ICD-10-CM | POA: Diagnosis not present

## 2020-10-02 MED ORDER — AMOXICILLIN-POT CLAVULANATE 875-125 MG PO TABS: 1.0000 | ORAL_TABLET | Freq: Two times a day (BID) | ORAL | 0 refills | Status: AC

## 2020-10-02 MED ORDER — PREDNISONE 20 MG PO TABS: 20.0000 mg | ORAL_TABLET | Freq: Every day | ORAL | 0 refills | Status: AC

## 2020-10-02 MED ORDER — BENZONATATE 200 MG PO CAPS: 200.0000 mg | ORAL_CAPSULE | Freq: Three times a day (TID) | ORAL | 0 refills | Status: AC | PRN

## 2020-10-02 NOTE — Progress Notes (Signed)
Kathleen Guerrero is a 74 y.o. female with the following history as recorded in EpicCare:  Patient Active Problem List   Diagnosis Date Noted  . GERD (gastroesophageal reflux disease) 11/12/2018  . Scalp lesion 03/09/2018  . Eczema 03/09/2018  . Adenocarcinoma of right lung, stage 1 ([place]) 12/29/2017  . Preventative health care 10/01/2017  . Hyperglycemia 10/01/2017  . AKI (acute kidney injury) ([place]) 09/24/2017  . Fatigue 09/23/2017  . Fall   . Recurrent major depressive disorder, in partial remission ([place])   . Acute lower UTI   . Labile blood pressure   . Essential hypertension   . Urinary frequency   . Hyponatremia   . Hypoalbuminemia due to protein-calorie malnutrition ([place])   . Incontinence of feces   . Critical illness myopathy 08/20/2017  . Debility   . Adenocarcinoma ([place])   . Anxiety and depression   . Agitation   . Dysphagia   . Acute blood loss anemia   . History of pneumothorax   . PAF (paroxysmal atrial fibrillation) ([place])   . History of CVA (cerebrovascular accident)   . Hyperlipidemia   . Hypernatremia   . Leukocytosis   . S/P lobectomy of lung 07/30/2017  . Lower extremity edema 04/30/2017  . TIA (transient ischemic attack) 01/16/2017  . Dyslipidemia, goal LDL below 70 01/03/2017  . Diarrhea 09/12/2016  . Sleep disturbance 07/26/2016  . Overactive bladder 07/26/2016  . Tobacco abuse 07/21/2016  . Solitary pulmonary nodule 07/21/2016  . Acute CVA (cerebrovascular accident) ([place]) 07/21/2016  . Acute lacunar stroke ([place]) 07/20/2016  . Hypokalemia 07/20/2016  . Benign essential HTN 07/20/2016    Current Outpatient Medications  Medication Sig Dispense Refill  . acetaminophen (TYLENOL) 325 MG tablet Take 650 mg by mouth daily as needed for mild pain or headache.    Marland Kitchen amiodarone (PACERONE) 200 MG tablet Take 1 tablet (200 mg total) by mouth daily. 30 tablet 0  . amLODipine (NORVASC) 10 MG tablet TAKE 1 TABLET BY MOUTH EVERY DAY 90 tablet 1  .  amoxicillin-clavulanate (AUGMENTIN) 875-125 MG tablet Take 1 tablet by mouth 2 (two) times daily for 10 days. 20 tablet 0  . aspirin 325 MG tablet Take 1 tablet (325 mg total) by mouth daily. 30 tablet 11  . atorvastatin (LIPITOR) 40 MG tablet Take 1 tablet (40 mg total) by mouth daily at 6 PM. TAKE 1 TABLET BY MOUTH EVERY DAY AT 6 PM 90 tablet 3  . benzonatate (TESSALON) 200 MG capsule Take 1 capsule (200 mg total) by mouth 3 (three) times daily as needed. 30 capsule 0  . calcium carbonate (TUMS - DOSED IN MG ELEMENTAL CALCIUM) 500 MG chewable tablet Chew 1 tablet by mouth 4 (four) times daily as needed for indigestion or heartburn.    . escitalopram (LEXAPRO) 10 MG tablet TAKE 1 TABLET BY MOUTH EVERY DAY 30 tablet 0  . metoprolol tartrate (LOPRESSOR) 25 MG tablet TAKE 1/2 TABLET BY MOUTH 2 TIMES DAILY 90 tablet 5  . ondansetron (ZOFRAN) 4 MG tablet Take 1 tablet (4 mg total) by mouth every 8 (eight) hours as needed for nausea or vomiting. 20 tablet 0  . pantoprazole (PROTONIX) 40 MG tablet TAKE 1 TABLET BY MOUTH EVERY DAY 90 tablet 1  . predniSONE (DELTASONE) 20 MG tablet Take 1 tablet (20 mg total) by mouth daily with breakfast. 5 tablet 0   No current facility-administered medications for this visit.    Allergies: Patient has no known allergies.  Past Medical History:  Diagnosis Date  . Arthritis   . GERD (gastroesophageal reflux disease)    tums  . Headache   . Hepatitis    cmv 30 yrs ago hepatitis  . History of hiatal hernia   . History of loop recorder    after stroke  . Hypertension   . Stroke ([place]) 07/2016   balance a little off  . Tobacco use     Past Surgical History:  Procedure Laterality Date  . APPENDECTOMY    . DERMOID CYST  EXCISION     x2 34'  . EP IMPLANTABLE DEVICE N/A 07/23/2016   Procedure: Loop Recorder Insertion;  Surgeon: Thompson Grayer, MD;  Location: [facility];  Service: Cardiovascular;  Laterality: N/A;  . Intestinal obstruction  1979  .  LOBECTOMY Right 07/30/2017   Procedure: LOBECTOMY;  Surgeon: [name], MD;  Location: [facility];  Service: Thoracic;  Laterality: Right;  . TEE WITHOUT CARDIOVERSION N/A 07/23/2016   Procedure: TRANSESOPHAGEAL ECHOCARDIOGRAM (TEE);  Surgeon: Lelon Perla, MD;  Location[facility];  Service: Cardiovascular;  Laterality: N/A;  . VIDEO ASSISTED THORACOSCOPY (VATS)/WEDGE RESECTION Right 07/30/2017   Procedure: VIDEO ASSISTED THORACOSCOPY (VATS)/WEDGE RESECTION;  Surgeon: [name], MD;  Location: [facility] OR;  Service: Thoracic;  Laterality: Right;    Family History  Problem Relation Age of Onset  . Kidney disease Maternal Grandmother   . Cancer Maternal Grandfather     Social History   Tobacco Use  . Smoking status: Former Smoker    Packs/day: 1.00    Years: 50.00    Pack years: 50.00    Types: Cigarettes    Quit date: 07/20/2016    Years since quitting: 4.2  . Smokeless tobacco: Never Used  Substance Use Topics  . Alcohol use: Yes    Comment: Rarely    Subjective:   I connected with Kathleen Guerrero on 10/02/20 at 10:20 AM EST by a video enabled telemedicine application and verified that I am speaking with the correct person using two identifiers.   I discussed the limitations of evaluation and management by telemedicine and the availability of in person appointments. The patient expressed understanding and agreed to proceed. Provider in office/ patient is at home; provider and patient are only 2 people on video call.   1 week history of cough/ low grade fever ( ranging around 100-101); + facial pressure/ "rattling cough" in chest; is fully vaccinated against COVID but no booster yet; Limited relief with OTC cough medications;    Objective:  There were no vitals filed for this visit.  General: Well developed, well nourished, in no acute distress  Head: Normocephalic and atraumatic  Lungs: Respirations unlabored;  Neurologic: Alert and oriented; speech intact;  face symmetrical;  Assessment:  1. URI with cough and congestion     Plan:  Will schedule for COVID testing today; start Augmentin 875 mg bid x 10 days, prednisone 20 mg qd x 5 days and Tessalon Perles; follow-up to be determined based on COVID testing- if symptoms persist, will also need to consider CXR;   No follow-ups on file.  Orders Placed This Encounter  Procedures  . Novel Coronavirus, NAA ([facility])    Order Specific Question:   Is this test for diagnosis or screening    Answer:   Diagnosis of ill patient    Order Specific Question:   Symptomatic for COVID-19 as defined by CDC    Answer:   Yes    Order Specific Question:  Date of Symptom Onset    Answer:   09/26/2020    Order Specific Question:   Hospitalized for COVID-19    Answer:   No    Order Specific Question:   Admitted to ICU for COVID-19    Answer:   No    Order Specific Question:   Previously tested for COVID-19    Answer:   No    Order Specific Question:   Resident in a congregate (group) care setting    Answer:   No    Order Specific Question:   Is the patient student?    Answer:   No    Order Specific Question:   Employed in healthcare setting    Answer:   No    Order Specific Question:   Pregnant    Answer:   No    Order Specific Question:   Has patient completed COVID vaccination(s) (2 doses of Pfizer/Moderna 1 dose of [employer])    Answer:   Yes    Order Specific Question:   Release to patient    Answer:   Immediate    Requested Prescriptions   Signed Prescriptions Disp Refills  . amoxicillin-clavulanate (AUGMENTIN) 875-125 MG tablet 20 tablet 0    Sig: Take 1 tablet by mouth 2 (two) times daily for 10 days.  . predniSONE (DELTASONE) 20 MG tablet 5 tablet 0    Sig: Take 1 tablet (20 mg total) by mouth daily with breakfast.  . benzonatate (TESSALON) 200 MG capsule 30 capsule 0    Sig: Take 1 capsule (200 mg total) by mouth 3 (three) times daily as needed.

## 2020-10-04 LAB — SARS-COV-2, NAA 2 DAY TAT

## 2020-10-04 LAB — NOVEL CORONAVIRUS, NAA: SARS-CoV-2, NAA: NOT DETECTED

## 2020-10-06 ENCOUNTER — Telehealth: Payer: Self-pay | Admitting: Internal Medicine

## 2020-10-06 NOTE — Telephone Encounter (Signed)
Patient called and was requesting a medication for a yeast infection. It can be sent to [place], [place] - [address]

## 2020-10-09 MED ORDER — FLUCONAZOLE 150 MG PO TABS: ORAL_TABLET | ORAL | 1 refills | Status: AC

## 2020-10-09 NOTE — Telephone Encounter (Signed)
Sent to Dr. John. 

## 2020-10-09 NOTE — Telephone Encounter (Signed)
Ok done erx 

## 2020-11-07 ENCOUNTER — Other Ambulatory Visit: Payer: Self-pay | Admitting: Internal Medicine

## 2020-11-15 ENCOUNTER — Telehealth (INDEPENDENT_AMBULATORY_CARE_PROVIDER_SITE_OTHER): Payer: Medicare HMO | Admitting: Family

## 2020-11-15 ENCOUNTER — Other Ambulatory Visit: Payer: Self-pay

## 2020-11-15 ENCOUNTER — Other Ambulatory Visit: Payer: Self-pay | Admitting: Family

## 2020-11-15 DIAGNOSIS — J069 Acute upper respiratory infection, unspecified: Secondary | ICD-10-CM

## 2020-11-15 NOTE — Addendum Note (Signed)
Addended by: Elza Rafter D on: 11/15/2020 12:07 PM   Modules accepted: Orders

## 2020-11-15 NOTE — Progress Notes (Signed)
Kathleen Guerrero is a 75 y.o. female with the following history as recorded in EpicCare:  Patient Active Problem List   Diagnosis Date Noted  . GERD (gastroesophageal reflux disease) 11/12/2018  . Scalp lesion 03/09/2018  . Eczema 03/09/2018  . Adenocarcinoma of right lung, stage 1 ([place]) 12/29/2017  . Preventative health care 10/01/2017  . Hyperglycemia 10/01/2017  . AKI (acute kidney injury) ([place]) 09/24/2017  . Fatigue 09/23/2017  . Fall   . Recurrent major depressive disorder, in partial remission ([place])   . Acute lower UTI   . Labile blood pressure   . Essential hypertension   . Urinary frequency   . Hyponatremia   . Hypoalbuminemia due to protein-calorie malnutrition ([place])   . Incontinence of feces   . Critical illness myopathy 08/20/2017  . Debility   . Adenocarcinoma ([place])   . Anxiety and depression   . Agitation   . Dysphagia   . Acute blood loss anemia   . History of pneumothorax   . PAF (paroxysmal atrial fibrillation) ([place])   . History of CVA (cerebrovascular accident)   . Hyperlipidemia   . Hypernatremia   . Leukocytosis   . S/P lobectomy of lung 07/30/2017  . Lower extremity edema 04/30/2017  . TIA (transient ischemic attack) 01/16/2017  . Dyslipidemia, goal LDL below 70 01/03/2017  . Diarrhea 09/12/2016  . Sleep disturbance 07/26/2016  . Overactive bladder 07/26/2016  . Tobacco abuse 07/21/2016  . Solitary pulmonary nodule 07/21/2016  . Acute CVA (cerebrovascular accident) ([place]) 07/21/2016  . Acute lacunar stroke ([place]) 07/20/2016  . Hypokalemia 07/20/2016  . Benign essential HTN 07/20/2016    Current Outpatient Medications  Medication Sig Dispense Refill  . acetaminophen (TYLENOL) 325 MG tablet Take 650 mg by mouth daily as needed for mild pain or headache.    Marland Kitchen amiodarone (PACERONE) 200 MG tablet Take 1 tablet (200 mg total) by mouth daily. 30 tablet 0  . amLODipine (NORVASC) 10 MG tablet TAKE 1 TABLET BY MOUTH EVERY DAY 90 tablet 2  . aspirin 325 MG  tablet Take 1 tablet (325 mg total) by mouth daily. 30 tablet 11  . atorvastatin (LIPITOR) 40 MG tablet TAKE 1 TABLET BY MOUTH EVERY DAY AT 6 PM 90 tablet 2  . benzonatate (TESSALON) 200 MG capsule Take 1 capsule (200 mg total) by mouth 3 (three) times daily as needed. 30 capsule 0  . calcium carbonate (TUMS - DOSED IN MG ELEMENTAL CALCIUM) 500 MG chewable tablet Chew 1 tablet by mouth 4 (four) times daily as needed for indigestion or heartburn.    . escitalopram (LEXAPRO) 10 MG tablet TAKE 1 TABLET BY MOUTH EVERY DAY 90 tablet 2  . fluconazole (DIFLUCAN) 150 MG tablet 1 tab by mouth every 3 days as needed 2 tablet 1  . metoprolol tartrate (LOPRESSOR) 25 MG tablet TAKE 1/2 TABLET BY MOUTH 2 TIMES DAILY 90 tablet 5  . ondansetron (ZOFRAN) 4 MG tablet Take 1 tablet (4 mg total) by mouth every 8 (eight) hours as needed for nausea or vomiting. 20 tablet 0  . pantoprazole (PROTONIX) 40 MG tablet TAKE 1 TABLET BY MOUTH EVERY DAY 90 tablet 2  . predniSONE (DELTASONE) 20 MG tablet Take 1 tablet (20 mg total) by mouth daily with breakfast. 5 tablet 0   No current facility-administered medications for this visit.    Allergies: Patient has no known allergies.  Past Medical History:  Diagnosis Date  . Arthritis   . GERD (gastroesophageal reflux disease)  tums  . Headache   . Hepatitis    cmv 30 yrs ago hepatitis  . History of hiatal hernia   . History of loop recorder    after stroke  . Hypertension   . Stroke ([place]) 07/2016   balance a little off  . Tobacco use     Past Surgical History:  Procedure Laterality Date  . APPENDECTOMY    . DERMOID CYST  EXCISION     x2 97'  . EP IMPLANTABLE DEVICE N/A 07/23/2016   Procedure: Loop Recorder Insertion;  Surgeon: Thompson Grayer, MD;  Location: [facility];  Service: Cardiovascular;  Laterality: N/A;  . Intestinal obstruction  1979  . LOBECTOMY Right 07/30/2017   Procedure: LOBECTOMY;  Surgeon: [name], MD;  Location: [facility];   Service: Thoracic;  Laterality: Right;  . TEE WITHOUT CARDIOVERSION N/A 07/23/2016   Procedure: TRANSESOPHAGEAL ECHOCARDIOGRAM (TEE);  Surgeon: Lelon Perla, MD;  Location: [facility] ENDOSCOPY;  Service: Cardiovascular;  Laterality: N/A;  . VIDEO ASSISTED THORACOSCOPY (VATS)/WEDGE RESECTION Right 07/30/2017   Procedure: VIDEO ASSISTED THORACOSCOPY (VATS)/WEDGE RESECTION;  Surgeon: [name], MD;  Location: [facility] OR;  Service: Thoracic;  Laterality: Right;    Family History  Problem Relation Age of Onset  . Kidney disease Maternal Grandmother   . Cancer Maternal Grandfather     Social History   Tobacco Use  . Smoking status: Former Smoker    Packs/day: 1.00    Years: 50.00    Pack years: 50.00    Types: Cigarettes    Quit date: 07/20/2016    Years since quitting: 4.3  . Smokeless tobacco: Never Used  Substance Use Topics  . Alcohol use: Yes    Comment: Rarely    Subjective:   I connected with Kathleen Guerrero on 11/15/20 at 10:00 AM EST by a video enabled telemedicine application and verified that I am speaking with the correct person using two identifiers.   I discussed the limitations of evaluation and management by telemedicine and the availability of in person appointments. The patient expressed understanding and agreed to proceed. Provider in office/ patient is at home; provider and patient are only 2 people on video call.   Treated for probable bronchitis in early December; did feel like that infection cleared completely; Traveled by plane over Christmas- started with body aches, fever, cough congestion this past weekend; has taken her COVID vaccines and booster; did get flu shot in September as well- has found out that she did not get high dose flu shot; temperature averaging 100-101; taking Tylenol for symptom relief;     Objective:  There were no vitals filed for this visit.  General: Well developed, well nourished, in no acute distress  Head: Normocephalic and  atraumatic  Lungs: Respirations unlabored;  Neurologic: Alert and oriented; speech intact;   Assessment:  1. Viral URI with cough     Plan:  ? Flu vs COVID; do not think antibiotic needed at this time; she will re-start the Ladona Ridgel that were  Prescribed in early December; increase fluids, rest and continue Tylenol for symptom relief; follow to be determined.  No follow-ups on file.  Orders Placed This Encounter  Procedures  . Novel Coronavirus, NAA ([facility])    Order Specific Question:   Is this test for diagnosis or screening    Answer:   Diagnosis of ill patient    Order Specific Question:   Symptomatic for COVID-19 as defined by [facility]  Answer:   Yes    Order Specific Question:   Date of Symptom Onset    Answer:   11/11/2020    Order Specific Question:   Hospitalized for COVID-19    Answer:   No    Order Specific Question:   Admitted to ICU for COVID-19    Answer:   No    Order Specific Question:   Previously tested for COVID-19    Answer:   Yes    Order Specific Question:   Resident in a congregate (group) care setting    Answer:   No    Order Specific Question:   Is the patient student?    Answer:   No    Order Specific Question:   Employed in healthcare setting    Answer:   No    Order Specific Question:   Pregnant    Answer:   No    Order Specific Question:   Has patient completed COVID vaccination(s) (2 doses of Pfizer/Moderna 1 dose of [employer])    Answer:   Yes    Requested Prescriptions    No prescriptions requested or ordered in this encounter

## 2020-11-15 NOTE — Progress Notes (Signed)
Covid & flu test scheduled for 1330.  Arrival instructions given; pt verb understanding.

## 2020-11-17 LAB — SARS-COV-2, NAA 2 DAY TAT

## 2020-11-17 LAB — SPECIMEN STATUS REPORT

## 2020-11-17 LAB — NOVEL CORONAVIRUS, NAA: SARS-CoV-2, NAA: NOT DETECTED

## 2021-07-10 ENCOUNTER — Other Ambulatory Visit: Payer: Medicare HMO

## 2021-07-11 ENCOUNTER — Ambulatory Visit: Payer: Medicare HMO | Admitting: Internal Medicine

## 2021-08-16 ENCOUNTER — Telehealth: Payer: Self-pay | Admitting: Medical Oncology

## 2021-08-16 NOTE — Telephone Encounter (Signed)
Pt moved to [place] ,[place] and is seeing a med oncologist ," Dr Blanch Media" next week. Dr Jacqulyn Ducking office requested records to be faxed. Done

## 2022-01-03 ENCOUNTER — Other Ambulatory Visit: Payer: Self-pay | Admitting: Internal Medicine

## 2022-01-03 NOTE — Telephone Encounter (Signed)
Please to contact pt  ? ?Med refilled x 1 mo ? ?Please to make ROV for further refills ?
